# Patient Record
Sex: Female | Born: 1945
Health system: Southern US, Community
[De-identification: ages and names within clinical notes are randomized; demographics above are authoritative.]

## PROBLEM LIST (undated history)

## (undated) DIAGNOSIS — I10 Essential (primary) hypertension: Secondary | ICD-10-CM

## (undated) DIAGNOSIS — C189 Malignant neoplasm of colon, unspecified: Secondary | ICD-10-CM

## (undated) HISTORY — DX: Essential (primary) hypertension: I10

## (undated) HISTORY — PX: DILATION AND CURETTAGE, DIAGNOSTIC / THERAPEUTIC: SUR384

## (undated) HISTORY — DX: Malignant neoplasm of colon, unspecified: C18.9

---

## 2012-08-20 ENCOUNTER — Emergency Department (HOSPITAL_COMMUNITY)
Admission: EM | Admit: 2012-08-20 | Discharge: 2012-08-20 | Disposition: A | Discharge status: Home / Self Care | Payer: Medicare Other | Attending: Emergency Medicine | Admitting: Emergency Medicine

## 2012-08-20 ENCOUNTER — Emergency Department (HOSPITAL_COMMUNITY): Payer: Medicare Other

## 2012-08-20 ENCOUNTER — Encounter (HOSPITAL_COMMUNITY): Payer: Self-pay | Admitting: *Deleted

## 2012-08-20 DIAGNOSIS — S82891A Other fracture of right lower leg, initial encounter for closed fracture: Secondary | ICD-10-CM

## 2012-08-20 DIAGNOSIS — S82899A Other fracture of unspecified lower leg, initial encounter for closed fracture: Secondary | ICD-10-CM | POA: Insufficient documentation

## 2012-08-20 DIAGNOSIS — W19XXXA Unspecified fall, initial encounter: Secondary | ICD-10-CM | POA: Insufficient documentation

## 2012-08-20 MED ORDER — TRAMADOL HCL 50 MG PO TABS: 50.0000 mg | ORAL_TABLET | Freq: Four times a day (QID) | ORAL | Status: DC | PRN

## 2012-08-20 MED ORDER — OXYCODONE-ACETAMINOPHEN 5-325 MG PO TABS
1.0000 | ORAL_TABLET | Freq: Once | ORAL | Status: AC
  Administered 2012-08-20: 1 via ORAL
  Filled 2012-08-20: qty 1

## 2012-08-20 NOTE — ED Notes (Signed)
Pt. Was at home and fell when she got up.  Pt. Reports "I have a tendency to cross my feet when I get up and I fall."  Pt. Has c/o hearing a "pop and now has swelling and 2/10 pain int he right ankle."

## 2012-08-20 NOTE — Progress Notes (Signed)
Orthopedic Tech Progress Note Patient Details:  Latoya Jones 26-May-1946 [id number]  Ortho Devices Type of Ortho Device: Other (comment) Ortho Device/Splint Location: right LE Ortho Device/Splint Interventions: Application Per pt RN, applied splint to immobilize   [name] T 08/20/2012, 3:56 PM

## 2012-08-20 NOTE — ED Provider Notes (Signed)
Medical screening examination/treatment/procedure(s) were conducted as a shared visit with non-physician practitioner(s) and myself.  I personally evaluated the patient during the encounter  Note swelling and point tenderness along rt malleolus.  Discussed outpt f/u and assessted pt with obtaining a walker for support.   Tobin Chad, MD 08/20/12 [phone]

## 2012-08-20 NOTE — ED Provider Notes (Signed)
History     CSN: [id number]  Arrival date & time 08/20/12  1148   First MD Initiated Contact with Patient 08/20/12 1148      Chief Complaint  Patient presents with  . Fall  . Ankle Pain    (Consider location/radiation/quality/duration/timing/severity/associated sxs/prior treatment) HPI  66 year old female presents complaining of right ankle injury. Patient reports she was sitting on her chair with her legs crossed while drink coffee. She attempts to stand up and her leg gave out on her causing her to fall forward. She felt a pop in her right ankle with immediate sharp pain. She denies hitting her head or loss of consciousness. She did hit her right knee but denies any significant knee pain. She did not attempt to walk afterward. Her pain is controlled right now. She rated at a 2/10 without movement. She denies any other injury.  History reviewed. No pertinent past medical history.  History reviewed. No pertinent past surgical history.  History reviewed. No pertinent family history.  History  Substance Use Topics  . Smoking status: Not on file  . Smokeless tobacco: Not on file  . Alcohol Use: No    OB History    Grav Para Term Preterm Abortions TAB SAB Ect Mult Living                  Review of Systems  Constitutional: Negative for fever.  Cardiovascular: Negative for leg swelling.  Musculoskeletal: Positive for joint swelling.  Skin: Negative for wound.  Neurological: Negative for numbness.  All other systems reviewed and are negative.    Allergies  Review of patient's allergies indicates no known allergies.  Home Medications   Current Outpatient Rx  Name Route Sig Dispense Refill  . BENZTROPINE MESYLATE 1 MG PO TABS Oral Take 1 mg by mouth at bedtime.    Marland Kitchen DIVALPROEX SODIUM ER 250 MG PO TB24 Oral Take 250 mg by mouth 2 (two) times daily.    Marland Kitchen NAPROXEN SODIUM 220 MG PO TABS Oral Take 220 mg by mouth once as needed. For pain    . PERPHENAZINE 8 MG PO TABS  Oral Take 8 mg by mouth at bedtime.      BP 103/61  Pulse 73  Temp 98.4 F (36.9 C) (Oral)  Resp 18  SpO2 97%  Physical Exam  Nursing note and vitals reviewed. Constitutional: She appears well-developed and well-nourished. No distress.  HENT:  Head: Atraumatic.  Eyes: Conjunctivae normal are normal.  Neck: Neck supple.  Musculoskeletal:       Right hip: Normal.       Right knee: Normal.       Right ankle: She exhibits decreased range of motion. She exhibits no swelling, no ecchymosis, no deformity, no laceration and normal pulse. tenderness. Lateral malleolus tenderness found. No medial malleolus, no CF ligament, no head of 5th metatarsal and no proximal fibula tenderness found. Achilles tendon normal.  Neurological: She is alert.  Skin: No rash noted.  Psychiatric: She has a normal mood and affect.    ED Course  Procedures (including critical care time)  No results found for this or any previous visit. Dg Ankle Complete Right  08/20/2012  *RADIOLOGY REPORT*  Clinical Data: Right ankle pain following fall earlier today  RIGHT ANKLE - COMPLETE 3+ VIEW  Comparison: None.  Findings:  Query a subtle lucency at the distal tip of the lateral malleolus with associated overlying soft tissue swelling.  There may be a small ankle joint effusion.  Mild degenerative midfoot arthritis.  Well corticated ossicle inferior to the lateral malleolus is likely a secondary ossification center.  IMPRESSION:  Subtle lucency at the tip of the lateral malleolus with associated soft tissue swelling may represent a nondisplaced avulsion fracture.  Recommend clinical correlation for point tenderness. The remainder of the bones and joints are intact.  A small ankle joint effusion   Original Report Authenticated By: HEATH     1. Nondisplaced ankle fracture, Right  MDM  Pt loss her balance when her leg fell as sleep as she sat with her leg crossed.  She fell and injured her R ankle.  Pain most significant to  Lateral malleolar region.  No foot pain.  NVI.  Will obtain xray for further evaluation.   2:47 PM Xray show a suspected subtle lucency at the tip of the lateral malleolus with associated soft tissue swelling.  This may represents a nondisplaced avulsion fx.  The finding on xray correspond to pt's pain.  Will apply ankle splint, provide prescription for a walker and will give referral to ortho. Pain medication given.      BP 103/38  Pulse 74  Temp 98.4 F (36.9 C) (Oral)  Resp 18  SpO2 98%  Nursing notes reviewed and considered in documentation  Previous records reviewed and considered  All labs/vitals reviewed and considered  xrays reviewed and considered      Fayrene Helper, PA-C 08/20/12 1518

## 2015-03-11 ENCOUNTER — Ambulatory Visit
Admit: 2015-03-11 | Disposition: A | Discharge status: Home / Self Care | Payer: Self-pay | Attending: Family Medicine | Admitting: Family Medicine

## 2015-03-19 ENCOUNTER — Ambulatory Visit
Admit: 2015-03-19 | Disposition: A | Discharge status: Home / Self Care | Payer: Self-pay | Attending: Family Medicine | Admitting: Family Medicine

## 2016-09-14 ENCOUNTER — Ambulatory Visit
Admission: RE | Admit: 2016-09-14 | Discharge: 2016-09-14 | Disposition: A | Discharge status: Home / Self Care | Payer: Medicare Other | Source: Ambulatory Visit | Attending: Obstetrics and Gynecology | Admitting: Obstetrics and Gynecology

## 2016-09-14 ENCOUNTER — Other Ambulatory Visit: Payer: Self-pay | Admitting: Obstetrics and Gynecology

## 2016-09-14 DIAGNOSIS — L539 Erythematous condition, unspecified: Secondary | ICD-10-CM

## 2017-12-03 ENCOUNTER — Emergency Department: Payer: Medicare Other

## 2017-12-03 ENCOUNTER — Other Ambulatory Visit: Payer: Self-pay

## 2017-12-03 ENCOUNTER — Inpatient Hospital Stay: Payer: Medicare Other

## 2017-12-03 ENCOUNTER — Inpatient Hospital Stay
Admission: EM | Admit: 2017-12-03 | Discharge: 2017-12-13 | DRG: 854 | Disposition: A | Discharge status: Home / Self Care | Payer: Medicare Other | Attending: Internal Medicine | Admitting: Internal Medicine

## 2017-12-03 DIAGNOSIS — E860 Dehydration: Secondary | ICD-10-CM | POA: Diagnosis present

## 2017-12-03 DIAGNOSIS — K639 Disease of intestine, unspecified: Secondary | ICD-10-CM | POA: Diagnosis present

## 2017-12-03 DIAGNOSIS — F209 Schizophrenia, unspecified: Secondary | ICD-10-CM | POA: Diagnosis present

## 2017-12-03 DIAGNOSIS — R42 Dizziness and giddiness: Secondary | ICD-10-CM

## 2017-12-03 DIAGNOSIS — Z6837 Body mass index (BMI) 37.0-37.9, adult: Secondary | ICD-10-CM

## 2017-12-03 DIAGNOSIS — F39 Unspecified mood [affective] disorder: Secondary | ICD-10-CM | POA: Diagnosis present

## 2017-12-03 DIAGNOSIS — E872 Acidosis, unspecified: Secondary | ICD-10-CM

## 2017-12-03 DIAGNOSIS — R188 Other ascites: Secondary | ICD-10-CM | POA: Diagnosis present

## 2017-12-03 DIAGNOSIS — C180 Malignant neoplasm of cecum: Secondary | ICD-10-CM | POA: Diagnosis present

## 2017-12-03 DIAGNOSIS — Z79899 Other long term (current) drug therapy: Secondary | ICD-10-CM

## 2017-12-03 DIAGNOSIS — A419 Sepsis, unspecified organism: Principal | ICD-10-CM | POA: Diagnosis present

## 2017-12-03 DIAGNOSIS — K3580 Unspecified acute appendicitis: Secondary | ICD-10-CM | POA: Diagnosis not present

## 2017-12-03 DIAGNOSIS — I248 Other forms of acute ischemic heart disease: Secondary | ICD-10-CM | POA: Diagnosis present

## 2017-12-03 DIAGNOSIS — I2510 Atherosclerotic heart disease of native coronary artery without angina pectoris: Secondary | ICD-10-CM | POA: Diagnosis present

## 2017-12-03 DIAGNOSIS — K36 Other appendicitis: Secondary | ICD-10-CM

## 2017-12-03 DIAGNOSIS — N179 Acute kidney failure, unspecified: Secondary | ICD-10-CM | POA: Diagnosis present

## 2017-12-03 DIAGNOSIS — Z87891 Personal history of nicotine dependence: Secondary | ICD-10-CM | POA: Diagnosis not present

## 2017-12-03 DIAGNOSIS — F0390 Unspecified dementia without behavioral disturbance: Secondary | ICD-10-CM | POA: Diagnosis present

## 2017-12-03 DIAGNOSIS — K6389 Other specified diseases of intestine: Secondary | ICD-10-CM

## 2017-12-03 DIAGNOSIS — E871 Hypo-osmolality and hyponatremia: Secondary | ICD-10-CM | POA: Diagnosis present

## 2017-12-03 LAB — COMPREHENSIVE METABOLIC PANEL
ALT: 23 U/L (ref 14–54)
AST: 55 U/L — ABNORMAL HIGH (ref 15–41)
Albumin: 3 g/dL — ABNORMAL LOW (ref 3.5–5.0)
Alkaline Phosphatase: 47 U/L (ref 38–126)
Anion gap: 14 (ref 5–15)
BUN: 11 mg/dL (ref 6–20)
CHLORIDE: 98 mmol/L — AB (ref 101–111)
CO2: 15 mmol/L — ABNORMAL LOW (ref 22–32)
Calcium: 8.8 mg/dL — ABNORMAL LOW (ref 8.9–10.3)
Creatinine, Ser: 1.35 mg/dL — ABNORMAL HIGH (ref 0.44–1.00)
GFR calc Af Amer: 45 mL/min — ABNORMAL LOW (ref >60)
GFR calc non Af Amer: 39 mL/min — ABNORMAL LOW (ref >60)
Glucose, Bld: 175 mg/dL — ABNORMAL HIGH (ref 65–99)
POTASSIUM: 3.2 mmol/L — AB (ref 3.5–5.1)
Sodium: 127 mmol/L — ABNORMAL LOW (ref 135–145)
Total Bilirubin: 1.4 mg/dL — ABNORMAL HIGH (ref 0.3–1.2)
Total Protein: 6.2 g/dL — ABNORMAL LOW (ref 6.5–8.1)

## 2017-12-03 LAB — INFLUENZA PANEL BY PCR (TYPE A & B)
INFLAPCR: NEGATIVE
Influenza B By PCR: NEGATIVE

## 2017-12-03 LAB — LACTIC ACID, PLASMA
Lactic Acid, Venous: 5.7 mmol/L (ref 0.5–1.9)
Lactic Acid, Venous: 1.9 mmol/L (ref 0.5–1.9)

## 2017-12-03 LAB — CBC
HCT: 42.2 % (ref 35.0–47.0)
Hemoglobin: 14.1 g/dL (ref 12.0–16.0)
MCH: 29.6 pg (ref 26.0–34.0)
MCHC: 33.4 g/dL (ref 32.0–36.0)
MCV: 88.5 fL (ref 80.0–100.0)
PLATELETS: 217 10*3/uL (ref 150–440)
RBC: 4.77 MIL/uL (ref 3.80–5.20)
RDW: 13.4 % (ref 11.5–14.5)
WBC: 20.1 10*3/uL — AB (ref 3.6–11.0)

## 2017-12-03 LAB — URINALYSIS, COMPLETE (UACMP) WITH MICROSCOPIC
BILIRUBIN URINE: NEGATIVE
Bacteria, UA: NONE SEEN
GLUCOSE, UA: NEGATIVE mg/dL
Ketones, ur: 20 mg/dL — AB
LEUKOCYTES UA: NEGATIVE
Nitrite: NEGATIVE
PH: 6 (ref 5.0–8.0)
Protein, ur: 100 mg/dL — AB
Specific Gravity, Urine: 1.015 (ref 1.005–1.030)

## 2017-12-03 LAB — TSH: TSH: 0.913 u[IU]/mL (ref 0.350–4.500)

## 2017-12-03 LAB — TROPONIN I
Troponin I: 0.06 ng/mL (ref <0.03)
Troponin I: 0.21 ng/mL (ref <0.03)
Troponin I: 0.21 ng/mL (ref <0.03)
Troponin I: 0.16 ng/mL (ref <0.03)

## 2017-12-03 MED ORDER — PNEUMOCOCCAL VAC POLYVALENT 25 MCG/0.5ML IJ INJ
0.5000 mL | INJECTION | INTRAMUSCULAR | Status: DC
  Filled 2017-12-03: qty 0.5

## 2017-12-03 MED ORDER — ACETAMINOPHEN 325 MG PO TABS
650.0000 mg | ORAL_TABLET | Freq: Four times a day (QID) | ORAL | Status: DC | PRN
  Administered 2017-12-04 (×2): 650 mg via ORAL
  Filled 2017-12-03 (×2): qty 2

## 2017-12-03 MED ORDER — SODIUM CHLORIDE 0.9 % IV BOLUS (SEPSIS)
1000.0000 mL | Freq: Once | INTRAVENOUS | Status: AC
  Administered 2017-12-03: 1000 mL via INTRAVENOUS

## 2017-12-03 MED ORDER — PERPHENAZINE 4 MG PO TABS
4.0000 mg | ORAL_TABLET | Freq: Two times a day (BID) | ORAL | Status: DC
  Administered 2017-12-03 – 2017-12-13 (×12): 4 mg via ORAL
  Filled 2017-12-03 (×22): qty 1

## 2017-12-03 MED ORDER — CALCIUM CARBONATE-VITAMIN D 500-200 MG-UNIT PO TABS
1.0000 | ORAL_TABLET | Freq: Every day | ORAL | Status: DC
  Administered 2017-12-03 – 2017-12-13 (×6): 1 via ORAL
  Filled 2017-12-03 (×7): qty 1

## 2017-12-03 MED ORDER — ONDANSETRON HCL 4 MG/2ML IJ SOLN
4.0000 mg | Freq: Four times a day (QID) | INTRAMUSCULAR | Status: DC | PRN
  Administered 2017-12-06 (×2): 4 mg via INTRAVENOUS
  Filled 2017-12-03 (×2): qty 2

## 2017-12-03 MED ORDER — SODIUM CHLORIDE 0.9 % IV BOLUS (SEPSIS): 1000.0000 mL | Freq: Once | INTRAVENOUS | Status: DC

## 2017-12-03 MED ORDER — KETOROLAC TROMETHAMINE 30 MG/ML IJ SOLN: 15.0000 mg | Freq: Four times a day (QID) | INTRAMUSCULAR | Status: DC

## 2017-12-03 MED ORDER — METRONIDAZOLE IN NACL 5-0.79 MG/ML-% IV SOLN
500.0000 mg | Freq: Three times a day (TID) | INTRAVENOUS | Status: DC
  Administered 2017-12-03 – 2017-12-09 (×18): 500 mg via INTRAVENOUS
  Filled 2017-12-03 (×20): qty 100

## 2017-12-03 MED ORDER — KCL IN DEXTROSE-NACL 20-5-0.9 MEQ/L-%-% IV SOLN
INTRAVENOUS | Status: DC
  Administered 2017-12-03 – 2017-12-04 (×3): via INTRAVENOUS
  Filled 2017-12-03 (×6): qty 1000

## 2017-12-03 MED ORDER — ONDANSETRON HCL 4 MG PO TABS: 4.0000 mg | ORAL_TABLET | Freq: Four times a day (QID) | ORAL | Status: DC | PRN

## 2017-12-03 MED ORDER — DIVALPROEX SODIUM 500 MG PO DR TAB
500.0000 mg | DELAYED_RELEASE_TABLET | Freq: Every day | ORAL | Status: DC
  Administered 2017-12-03 – 2017-12-12 (×6): 500 mg via ORAL
  Filled 2017-12-03 (×11): qty 1

## 2017-12-03 MED ORDER — BENZTROPINE MESYLATE 0.5 MG PO TABS
0.5000 mg | ORAL_TABLET | Freq: Every day | ORAL | Status: DC
Start: 2017-12-03 — End: 2017-12-13
  Administered 2017-12-03 – 2017-12-12 (×6): 0.5 mg via ORAL
  Filled 2017-12-03 (×11): qty 1

## 2017-12-03 MED ORDER — POTASSIUM CHLORIDE 2 MEQ/ML IV SOLN: INTRAVENOUS | Status: DC

## 2017-12-03 MED ORDER — MEMANTINE HCL ER 14 MG PO CP24
14.0000 mg | ORAL_CAPSULE | Freq: Every day | ORAL | Status: DC
  Administered 2017-12-03 – 2017-12-12 (×6): 14 mg via ORAL
  Filled 2017-12-03 (×11): qty 1

## 2017-12-03 MED ORDER — ACETAMINOPHEN 650 MG RE SUPP: 650.0000 mg | Freq: Four times a day (QID) | RECTAL | Status: DC | PRN

## 2017-12-03 MED ORDER — DEXTROSE 5 % IV SOLN
500.0000 mg | Freq: Once | INTRAVENOUS | Status: AC
  Administered 2017-12-03: 500 mg via INTRAVENOUS
  Filled 2017-12-03: qty 500

## 2017-12-03 MED ORDER — DOCUSATE SODIUM 100 MG PO CAPS
100.0000 mg | ORAL_CAPSULE | Freq: Two times a day (BID) | ORAL | Status: DC
  Administered 2017-12-03 – 2017-12-05 (×6): 100 mg via ORAL
  Filled 2017-12-03 (×6): qty 1

## 2017-12-03 MED ORDER — IOPAMIDOL (ISOVUE-300) INJECTION 61%
75.0000 mL | Freq: Once | INTRAVENOUS | Status: AC | PRN
  Administered 2017-12-03: 75 mL via INTRAVENOUS

## 2017-12-03 MED ORDER — HEPARIN SODIUM (PORCINE) 5000 UNIT/ML IJ SOLN: 5000.0000 [IU] | Freq: Three times a day (TID) | INTRAMUSCULAR | Status: DC

## 2017-12-03 MED ORDER — DEXTROSE 5 % IV SOLN
2.0000 g | INTRAVENOUS | Status: DC
  Administered 2017-12-04 – 2017-12-09 (×6): 2 g via INTRAVENOUS
  Filled 2017-12-03 (×7): qty 2

## 2017-12-03 MED ORDER — DEXTROSE 5 % IV SOLN
2.0000 g | Freq: Once | INTRAVENOUS | Status: AC
  Administered 2017-12-03: 2 g via INTRAVENOUS
  Filled 2017-12-03: qty 2

## 2017-12-03 MED ORDER — SODIUM CHLORIDE 0.9 % IV SOLN
INTRAVENOUS | Status: DC
  Administered 2017-12-03: 09:00:00 via INTRAVENOUS

## 2017-12-03 MED ORDER — CALCIUM CARBONATE ANTACID 500 MG PO CHEW
1.0000 | CHEWABLE_TABLET | Freq: Two times a day (BID) | ORAL | Status: DC | PRN
  Filled 2017-12-03: qty 1

## 2017-12-03 MED ORDER — CALCIUM 600-200 MG-UNIT PO TABS: 1.0000 | ORAL_TABLET | Freq: Every day | ORAL | Status: DC

## 2017-12-03 MED ORDER — ARIPIPRAZOLE 5 MG PO TABS
5.0000 mg | ORAL_TABLET | Freq: Every day | ORAL | Status: DC
  Administered 2017-12-03 – 2017-12-12 (×6): 5 mg via ORAL
  Filled 2017-12-03 (×11): qty 1

## 2017-12-03 NOTE — H&P (Signed)
Latoya Jones is an 72 y.o. female.   Chief Complaint: Dizziness HPI: The patient with past medical history of developmental disorder along with mood disorder and dementia presents to the emergency department complaining of dizziness.  She states that she is not been steady on her feet for days.  The patient lives in an adult care home due to her behavioral problems.  She feels normal when laying completely flat but otherwise feels dizzy.  She denies chest pain or shortness of breath but admits to abdominal pain.  The patient admits that she had been constipated and so she recently received warm prune juice.  Vital signs in the emergency department were concerning for sepsis.  Patient was started on antibiotics to cover pneumonia as well as urinary tract infection prior to the emergency department staff calling the hospitalist service for admission.  History reviewed. No pertinent past medical history. Patient cannot contribute to her medical history History reviewed. No pertinent surgical history. The patient does not remember having any surgeries No family history on file. None Social History:  reports that she has quit smoking. She does not have any smokeless tobacco history on file. She reports that she does not drink alcohol or use drugs.  Allergies: No Known Allergies  Prior to Admission medications   Medication Sig Start Date End Date Taking? Authorizing Provider  acetaminophen (TYLENOL) 500 MG tablet Take 1,000 mg by mouth every 8 (eight) hours as needed for mild pain.   Yes [provider]  alendronate (FOSAMAX) 70 MG tablet Take 70 mg by mouth once a week. 11/10/17  Yes [provider]  ARIPiprazole (ABILIFY) 5 MG tablet Take 5 mg by mouth at bedtime.  11/10/17  Yes [provider]  benztropine (COGENTIN) 0.5 MG tablet Take 0.5 mg by mouth at bedtime. 11/10/17  Yes [provider]  Calcium 600-200 MG-UNIT tablet Take 1 tablet by mouth daily.   Yes  [provider]  calcium carbonate (TUMS - DOSED IN MG ELEMENTAL CALCIUM) 500 MG chewable tablet Chew 1 tablet by mouth 2 (two) times daily as needed for indigestion or heartburn.   Yes [provider]  divalproex (DEPAKOTE) 500 MG DR tablet Take 500 mg by mouth at bedtime.  11/10/17  Yes [provider]  memantine (NAMENDA XR) 14 MG CP24 24 hr capsule Take 14 mg by mouth at bedtime.  11/10/17  Yes [provider]  perphenazine (TRILAFON) 4 MG tablet Take 4 mg by mouth 2 (two) times daily. 11/10/17  Yes [provider]     Results for orders placed or performed during the hospital encounter of 12/03/17 (from the past 48 hour(s))  CBC     Status: Abnormal   Collection Time: 12/03/17  4:36 AM  Result Value Ref Range   WBC 20.1 (H) 3.6 - 11.0 K/uL   RBC 4.77 3.80 - 5.20 MIL/uL   Hemoglobin 14.1 12.0 - 16.0 g/dL   HCT 42.2 35.0 - 47.0 %   MCV 88.5 80.0 - 100.0 fL   MCH 29.6 26.0 - 34.0 pg   MCHC 33.4 32.0 - 36.0 g/dL   RDW 13.4 11.5 - 14.5 %   Platelets 217 150 - 440 K/uL    Comment: Performed at [facility], [address] [place],  [address]  Comprehensive metabolic panel     Status: Abnormal   Collection Time: 12/03/17  4:36 AM  Result Value Ref Range   Sodium 127 (L) 135 - 145 mmol/L  Potassium 3.2 (L) 3.5 - 5.1 mmol/L   Chloride 98 (L) 101 - 111 mmol/L   CO2 15 (L) 22 - 32 mmol/L   Glucose, Bld 175 (H) 65 - 99 mg/dL   BUN 11 6 - 20 mg/dL   Creatinine, Ser 1.35 (H) 0.44 - 1.00 mg/dL   Calcium 8.8 (L) 8.9 - 10.3 mg/dL   Total Protein 6.2 (L) 6.5 - 8.1 g/dL   Albumin 3.0 (L) 3.5 - 5.0 g/dL   AST 55 (H) 15 - 41 U/L   ALT 23 14 - 54 U/L   Alkaline Phosphatase 47 38 - 126 U/L   Total Bilirubin 1.4 (H) 0.3 - 1.2 mg/dL   GFR calc non Af Amer 39 (L) >60 mL/min   GFR calc Af Amer 45 (L) >60 mL/min    Comment: (NOTE) The eGFR has been calculated using the CKD EPI equation. This calculation has not been validated in all  clinical situations. eGFR's persistently <60 mL/min signify possible Chronic Kidney Disease.    Anion gap 14 5 - 15    Comment: Performed at [facility], [address] [place], [place] [address]  Troponin I     Status: Abnormal   Collection Time: 12/03/17  4:36 AM  Result Value Ref Range   Troponin I 0.06 (HH) <0.03 ng/mL    Comment: CRITICAL RESULT CALLED TO, READ BACK BY AND VERIFIED WITH [facility] AT 6010 12/03/17.PMH Performed at [facility], [address] [place], [place] [address]   Lactic acid, plasma     Status: Abnormal   Collection Time: 12/03/17  4:36 AM  Result Value Ref Range   Lactic Acid, Venous 5.7 (HH) 0.5 - 1.9 mmol/L    Comment: CRITICAL RESULT CALLED TO, READ BACK BY AND VERIFIED WITH [facility] AT 5732 12/03/17.PMH Performed at [facility], [address] [place], [place] [address]   Urinalysis, Complete w Microscopic     Status: Abnormal   Collection Time: 12/03/17  6:26 AM  Result Value Ref Range   Color, Urine YELLOW (A) YELLOW   APPearance HAZY (A) CLEAR   Specific Gravity, Urine 1.015 1.005 - 1.030   pH 6.0 5.0 - 8.0   Glucose, UA NEGATIVE NEGATIVE mg/dL   Hgb urine dipstick SMALL (A) NEGATIVE   Bilirubin Urine NEGATIVE NEGATIVE   Ketones, ur 20 (A) NEGATIVE mg/dL   Protein, ur 100 (A) NEGATIVE mg/dL   Nitrite NEGATIVE NEGATIVE   Leukocytes, UA NEGATIVE NEGATIVE   RBC / HPF 0-5 0 - 5 RBC/hpf   WBC, UA 0-5 0 - 5 WBC/hpf   Bacteria, UA NONE SEEN NONE SEEN   Squamous Epithelial / LPF 0-5 (A) NONE SEEN   Mucus PRESENT    Hyaline Casts, UA PRESENT     Comment: Performed at [facility], [address] [place],  [address]   Dg Chest 2 View  Result Date: 12/03/2017 CLINICAL DATA:  Dizzy spells and diarrhea.  Fall.  Leg weakness. EXAM: CHEST  2 VIEW COMPARISON:  None. FINDINGS: Cardiomediastinal silhouette is normal. No pleural effusions or focal consolidations. LEFT lung base strandy densities.  Trachea projects midline and there is no pneumothorax. Soft tissue planes and included osseous structures are non-suspicious. IMPRESSION: LEFT lung base atelectasis/scarring. Electronically Signed   By: Elon Alas M.D.   On: 12/03/2017 06:15    Review of Systems  Constitutional: Negative for chills and fever.  HENT: Negative for sore throat and tinnitus.   Eyes: Negative  for blurred vision and redness.  Respiratory: Negative for cough and shortness of breath.   Cardiovascular: Negative for chest pain, palpitations, orthopnea and PND.  Gastrointestinal: Positive for constipation (until received warm prune juice enema). Negative for abdominal pain, diarrhea, nausea and vomiting.  Genitourinary: Negative for dysuria, frequency and urgency.  Musculoskeletal: Negative for joint pain and myalgias.  Skin: Negative for rash.       No lesions  Neurological: Positive for dizziness. Negative for speech change, focal weakness and weakness.  Endo/Heme/Allergies: Does not bruise/bleed easily.       No temperature intolerance  Psychiatric/Behavioral: Negative for depression and suicidal ideas.    Blood pressure (!) 109/55, pulse 94, temperature 99.2 F (37.3 C), temperature source Oral, resp. rate 17, height '5\' 5"'  (1.651 m), weight 90.7 kg (200 lb), SpO2 98 %. Physical Exam  Vitals reviewed. Constitutional: She is oriented to person, place, and time. She appears well-developed and well-nourished. No distress.  HENT:  Head: Normocephalic and atraumatic.  Mouth/Throat: Oropharynx is clear and moist.  Eyes: Conjunctivae and EOM are normal. Pupils are equal, round, and reactive to light. No scleral icterus.  Neck: Normal range of motion. Neck supple. No JVD present. No tracheal deviation present. No thyromegaly present.  Cardiovascular: Normal rate, regular rhythm and normal heart sounds. Exam reveals no gallop and no friction rub.  No murmur heard. Respiratory: Effort normal and breath sounds  normal.  GI: Soft. Bowel sounds are normal. She exhibits no distension. There is no tenderness.  Genitourinary:  Genitourinary Comments: Deferred  Musculoskeletal: Normal range of motion. She exhibits no edema.  Lymphadenopathy:    She has no cervical adenopathy.  Neurological: She is alert and oriented to person, place, and time. No cranial nerve deficit. She exhibits normal muscle tone.  Skin: Skin is warm and dry. No rash noted. No erythema.  Psychiatric: She has a normal mood and affect. Her behavior is normal. Judgment and thought content normal.     Assessment/Plan This is a 72 year old female admitted for sepsis. 1.  Sepsis: The patient meets criteria via tachycardia and tachypnea.  Lactic acid also increased.  Continue ceftriaxone.  Add vancomycin.  Urine appears clean at this time.  Chest x-ray does not demonstrate pneumonia.  Concerned that process may be intra-abdominal. 2.  Abdominal pain: Reproducible with guarding over right lower quadrant.  Also firmness on physical exam of the abdomen is not yet surgical.  The patient will undergo CT scan to rule out appendicitis. 3.  Acute kidney injury: Hydrate with intravenous fluid.  Avoid nephrotoxic agents. 4. Dementia: Stable; continue Namenda 5.  Mood disorder: No behavioral disturbance at this time.  Continue Abilify, Cogentin, Depakote and perphenazine 6.  DVT prophylaxis: Lovenox 7.  GI prophylaxis: None The patient is a full code.  Time spent on admission orders and patient care approximately 45 minutes  Harrie Foreman, MD 12/03/2017, 7:23 AM

## 2017-12-03 NOTE — ED Notes (Signed)
Attempted to call report to 1C 

## 2017-12-03 NOTE — Progress Notes (Signed)
Pharmacy Antibiotic Note  Latoya Jones is a 71 y.o. female admitted on 12/03/2017 with UTI.  Pharmacy has been consulted for CEFTRIAXONE dosing.  Plan: Ceftriaxone 2 grams q 24 hours ordered.  Height: 5\' 5"  (165.1 cm) Weight: 200 lb (90.7 kg) IBW/kg (Calculated) : 57  Temp (24hrs), Avg:99.2 F (37.3 C), Min:99.2 F (37.3 C), Max:99.2 F (37.3 C)  Recent Labs  Lab 12/03/17 0436  WBC 20.1*  CREATININE 1.35*  LATICACIDVEN 5.7*    Estimated Creatinine Clearance: 43.8 mL/min (A) (by C-G formula based on SCr of 1.35 mg/dL (H)).    No Known Allergies  Antimicrobials this admission: Ceftriaxone 1/12  >>    >>   Dose adjustments this admission:   Microbiology results: No micro     1/12 UA: LE(-)  NO2(-)  WBC 0-5 Thank you for allowing pharmacy to be a part of this patient's care.  Rennie Hack S 12/03/2017 7:05 AM

## 2017-12-03 NOTE — Progress Notes (Signed)
Britton responded to order requisition for HCPOA information as patient would like her sister to be her designated voice. CH provided educational literature and educated patient on HCPOA and patient stated she will go over the paperwork with her sister and contact [facility] office when ready to complete. Nile spoke with nurse as patient stated that she was hungry and wanted to know about her procedure time.    12/03/17 1712  Clinical Encounter Type  Visited With Patient;Health care provider  Visit Type Initial;Spiritual support  Referral From Nurse;Physician

## 2017-12-03 NOTE — ED Provider Notes (Signed)
[facility] Emergency Department Provider Note   ____________________________________________   First MD Initiated Contact with Patient 12/03/17 [phone]     (approximate)  I have reviewed the triage vital signs and the nursing notes.   HISTORY  Chief Complaint Dizziness    HPI Latoya Jones is a 72 y.o. female who comes into the hospital today with dizzy spells.  The patient states that it started yesterday morning and it was worse today.  The patient states that she could not get up to go to the bathroom.  She was just dizzy.  She denies any room spinning.  She also has had no headache or runny nose.  The patient had a mild temperature when she arrived.  According to EMS the patient's O2 saturations were 88% so they placed her on some oxygen by [place] cannula.  The patient denies any history of UTI.  She states that she has had some abdominal pain but it is more consistent with her reflux.  The patient states that she was so dizzy that she sat herself down on the floor.  She has had some shortness of breath but denies any chest pain.  The patient came into the hospital today for further evaluation of her symptoms.   History reviewed. No pertinent past medical history.  Patient Active Problem List   Diagnosis Date Noted  . Sepsis ([place]) 12/03/2017    History reviewed. No pertinent surgical history.  Prior to Admission medications   Medication Sig Start Date End Date Taking? Authorizing Provider  acetaminophen (TYLENOL) 500 MG tablet Take 1,000 mg by mouth every 8 (eight) hours as needed for mild pain.   Yes [provider]  alendronate (FOSAMAX) 70 MG tablet Take 70 mg by mouth once a week. 11/10/17  Yes [provider]  ARIPiprazole (ABILIFY) 5 MG tablet Take 5 mg by mouth at bedtime.  11/10/17  Yes [provider]  benztropine (COGENTIN) 0.5 MG tablet Take 0.5 mg by mouth at bedtime. 11/10/17  Yes [provider]  Calcium  600-200 MG-UNIT tablet Take 1 tablet by mouth daily.   Yes [provider]  calcium carbonate (TUMS - DOSED IN MG ELEMENTAL CALCIUM) 500 MG chewable tablet Chew 1 tablet by mouth 2 (two) times daily as needed for indigestion or heartburn.   Yes [provider]  divalproex (DEPAKOTE) 500 MG DR tablet Take 500 mg by mouth at bedtime.  11/10/17  Yes [provider]  memantine (NAMENDA XR) 14 MG CP24 24 hr capsule Take 14 mg by mouth at bedtime.  11/10/17  Yes [provider]  perphenazine (TRILAFON) 4 MG tablet Take 4 mg by mouth 2 (two) times daily. 11/10/17  Yes [provider]    Allergies Patient has no known allergies.  No family history on file.  Social History Social History   Tobacco Use  . Smoking status: Former Smoker  Substance Use Topics  . Alcohol use: No  . Drug use: No    Review of Systems  Constitutional:  fever Eyes: No visual changes. ENT: No sore throat. Cardiovascular: Denies chest pain. Respiratory:  shortness of breath. Gastrointestinal: No abdominal pain.  No nausea, no vomiting.  No diarrhea.  No constipation. Genitourinary: Negative for dysuria. Musculoskeletal: Negative for back pain. Skin: Negative for rash. Neurological: dizziness   ____________________________________________   PHYSICAL EXAM:  VITAL SIGNS: ED Triage Vitals  Enc Vitals Group     BP 12/03/17 0430 117/79     Pulse  Rate 12/03/17 0430 (!) 114     Resp 12/03/17 0434 (!) 21     Temp 12/03/17 0434 99.2 F (37.3 C)     Temp Source 12/03/17 0434 Oral     SpO2 12/03/17 0430 94 %     Weight 12/03/17 0435 200 lb (90.7 kg)     Height 12/03/17 0435 5\' 5"  (1.651 m)     Head Circumference --      Peak Flow --      Pain Score --      Pain Loc --      Pain Edu? --      Excl. in [place]? --    Constitutional: Alert and oriented. Well appearing and in moderate distress. Eyes: Conjunctivae are normal. PERRL. EOMI. Head: Atraumatic. Nose: No  congestion/rhinnorhea. Mouth/Throat: Mucous membranes are moist.  Oropharynx non-erythematous. Cardiovascular: Normal rate, regular rhythm. Grossly normal heart sounds.  Good peripheral circulation. Respiratory: Normal respiratory effort.  No retractions. Lungs CTAB. Gastrointestinal: Soft and nontender. No distention. Positive bowel sounds Musculoskeletal: No lower extremity tenderness nor edema.   Neurologic:  Normal speech and language. Cranial nerves II through XII are grossly intact with no focal motor neuro deficit Skin:  Skin is warm, dry and intact.  Psychiatric: Mood and affect are normal.   ____________________________________________   LABS (all labs ordered are listed, but only abnormal results are displayed)  Labs Reviewed  CBC - Abnormal; Notable for the following components:      Result Value   WBC 20.1 (*)    All other components within normal limits  COMPREHENSIVE METABOLIC PANEL - Abnormal; Notable for the following components:   Sodium 127 (*)    Potassium 3.2 (*)    Chloride 98 (*)    CO2 15 (*)    Glucose, Bld 175 (*)    Creatinine, Ser 1.35 (*)    Calcium 8.8 (*)    Total Protein 6.2 (*)    Albumin 3.0 (*)    AST 55 (*)    Total Bilirubin 1.4 (*)    GFR calc non Af Amer 39 (*)    GFR calc Af Amer 45 (*)    All other components within normal limits  TROPONIN I - Abnormal; Notable for the following components:   Troponin I 0.06 (*)    All other components within normal limits  URINALYSIS, COMPLETE (UACMP) WITH MICROSCOPIC - Abnormal; Notable for the following components:   Color, Urine YELLOW (*)    APPearance HAZY (*)    Hgb urine dipstick SMALL (*)    Ketones, ur 20 (*)    Protein, ur 100 (*)    Squamous Epithelial / LPF 0-5 (*)    All other components within normal limits  LACTIC ACID, PLASMA - Abnormal; Notable for the following components:   Lactic Acid, Venous 5.7 (*)    All other components within normal limits  LACTIC ACID, PLASMA    INFLUENZA PANEL BY PCR (TYPE A & B)  TSH  TROPONIN I  TROPONIN I  TROPONIN I   ____________________________________________  EKG  ED ECG REPORT I, Loney Hering, the attending physician, personally viewed and interpreted this ECG.   Date: 12/03/2017  EKG Time: 434  Rate: 111  Rhythm: sinus tachycardia  Axis: normal  Intervals:none  ST&T Change: ST depression in leads II, III, aVF, V4, V5, V6  ____________________________________________  RADIOLOGY  Dg Chest 2 View  Result Date: 12/03/2017 CLINICAL DATA:  Dizzy spells and diarrhea.  Fall.  Leg weakness. EXAM: CHEST  2 VIEW COMPARISON:  None. FINDINGS: Cardiomediastinal silhouette is normal. No pleural effusions or focal consolidations. LEFT lung base strandy densities. Trachea projects midline and there is no pneumothorax. Soft tissue planes and included osseous structures are non-suspicious. IMPRESSION: LEFT lung base atelectasis/scarring. Electronically Signed   By: Elon Alas M.D.   On: 12/03/2017 06:15   Ct Abdomen Pelvis W Contrast  Result Date: 12/03/2017 CLINICAL DATA:  Generalized abdominal pain. EXAM: CT ABDOMEN AND PELVIS WITH CONTRAST TECHNIQUE: Multidetector CT imaging of the abdomen and pelvis was performed using the standard protocol following bolus administration of intravenous contrast. CONTRAST:  31mL ISOVUE-300 IOPAMIDOL (ISOVUE-300) INJECTION 61% COMPARISON:  None. FINDINGS: Lower chest: Dependent subsegmental atelectasis identified. Lung bases are otherwise normal. Hepatobiliary: No focal liver abnormality is seen. No gallstones, gallbladder wall thickening, or biliary dilatation. Pancreas: Unremarkable. No pancreatic ductal dilatation or surrounding inflammatory changes. Spleen: Normal in size without focal abnormality. Adrenals/Urinary Tract: A tiny nodule seen in the right adrenal gland measuring 9 mm. There is nodularity in the left adrenal gland as well with a nodule on coronal image 56 measuring  up to 12 mm. Bilateral renal cysts are identified. A nonobstructive stone is seen in the lower pole left kidney measuring 2 or 3 mm. Mild caliectasis bilaterally with no ureteral stones. The bladder is mildly distended. Stomach/Bowel: The stomach is normal. There is bulky masslike thickening in the cecum and ascending colon. The appendix is dilated and thickened with adjacent stranding consistent with appendicitis secondary to the cecal mass. No evidence of perforation. No appendicoliths. The appendix measures up to 2.1 cm distally. A loop of small bowel running inferior to the appendix is inflamed and thick walled. No small bowel obstruction or other small bowel abnormality. Vascular/Lymphatic: Atherosclerotic changes are seen in the nonaneurysmal aorta. A few mildly prominent pericecal nodes are identified such as on coronal image 51 and coronal image 30. No other adenopathy. Reproductive: Uterus and bilateral adnexa are unremarkable. Other: No free air. No omental or peritoneal abnormalities identified. Musculoskeletal: No acute or significant osseous findings. IMPRESSION: 1. 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 An adjacent loop of small bowel is inflamed as well, likely secondary to the cecal and appendiceal processes. Other causes such as inflammatory bowel disease are considered much less likely. The prominent peri cecal nodes could be reactive or metastatic. 2. Nodularity in the adrenal glands could represent adenomas. However, given the suspicion for malignancy, the patient may benefit from a PET-CT or MRI for further evaluation of these nodules. 3. Bilateral caliectasis, likely due to the distended bladder. No underlying cause otherwise seen. Findings being called to referring physician Electronically Signed   By: Dorise Bullion III M.D   On: 12/03/2017 08:15     ____________________________________________   PROCEDURES  Procedure(s) performed: please, see procedure note(s).  .Critical Care Performed by: Loney Hering, MD Authorized by: Loney Hering, MD   Critical care provider statement:    Critical care time (minutes):  30   Critical care start time:  12/03/2017 5:15 AM   Critical care end time:  12/03/2017 5:45 AM   Critical care was necessary to treat or prevent imminent or life-threatening deterioration of the following conditions:  Dehydration and sepsis   Critical care was time spent personally by me on the following activities:  Blood draw for specimens, development of treatment plan with patient or surrogate,  discussions with consultants, evaluation of patient's response to treatment, examination of patient, interpretation of cardiac output measurements, ordering and performing treatments and interventions, obtaining history from patient or surrogate, ordering and review of laboratory studies, ordering and review of radiographic studies, pulse oximetry, re-evaluation of patient's condition and review of old charts   I assumed direction of critical care for this patient from another provider in my specialty: no      Critical Care performed: Yes, see critical care note(s)  ____________________________________________   INITIAL IMPRESSION / ASSESSMENT AND PLAN / ED COURSE  As part of my medical decision making, I reviewed the following data within the electronic MEDICAL RECORD NUMBER Notes from prior ED visits and  Controlled Substance Database  This is a 72 year old female who comes into the hospital today with some dizziness.  The patient did have some tachycardia on arrival.  My differential diagnosis includes pneumonia, sepsis, dehydration, vertigo  When I did evaluate the patient her initial temperature was 100.3 which made me concerned for possible infectious cause of her dizziness.  Patient has had some shortness of  breath my concern was for pneumonia.  The patient had a chest x-ray which showed some left lung base atelectasis. The patient was admitted for sepsis.  While the admitting physician was examining the patient he discovered that she had some right lower quadrant abdominal pain.  She did not report this previously and it was not discovered on my initial exam.  We sent the patient for a CT scan looking for possible appendicitis given the negative urine and chest x-ray.  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  The patient will be admitted to the hospitalist service.  She was given some ceftriaxone and azithromycin.  She also was given 3 L of normal saline as her lactic acid was elevated.        ____________________________________________   FINAL CLINICAL IMPRESSION(S) / ED DIAGNOSES  Final diagnoses:  Dizziness  Hyponatremia  Lactic acidosis     ED Discharge Orders    None       Note:  This document was prepared using Dragon voice recognition software and may include unintentional dictation errors.    Loney Hering, MD 12/03/17 [phone]

## 2017-12-03 NOTE — ED Notes (Signed)
Pt transported by RN and EDT to room 102. Bedside report given at 0812 to Jackqulyn Livings., RN

## 2017-12-03 NOTE — Consult Note (Signed)
Jonathon Bellows , MD [address] [place], [place], [place], [address] [id number] [place], [place], [place], [address] Phone: [phone]  Fax: [phone]  Consultation  Referring Provider:  Dr Jerelyn Charles  Primary Care Physician:  Randel Pigg, MD Primary Gastroenterologist: None          Reason for Consultation:     Cecal mass   Date of Admission:  12/03/2017 Date of Consultation:  12/03/2017         HPI:   Latoya Jones is a 72 y.o. female . She has a history of a development disorder , admitted with sepsis, abdominal pain and underwent a CT scan which revealed acute appendicitis along with a cecal mass which is likely the cause of her appendicitis.  She has a history of dementia. [place] 20 K on admission .    She says she has had lower abdominal pain for a few days, no nausea or vomiting . She says she had some fever but cant recall when. Poor historian. Appears comfortable presently.   History reviewed. No pertinent past medical history.  History reviewed. No pertinent surgical history.  Prior to Admission medications   Medication Sig Start Date End Date Taking? Authorizing Provider  acetaminophen (TYLENOL) 500 MG tablet Take 1,000 mg by mouth every 8 (eight) hours as needed for mild pain.   Yes [provider]  alendronate (FOSAMAX) 70 MG tablet Take 70 mg by mouth once a week. 11/10/17  Yes [provider]  ARIPiprazole (ABILIFY) 5 MG tablet Take 5 mg by mouth at bedtime.  11/10/17  Yes [provider]  benztropine (COGENTIN) 0.5 MG tablet Take 0.5 mg by mouth at bedtime. 11/10/17  Yes [provider]  Calcium 600-200 MG-UNIT tablet Take 1 tablet by mouth daily.   Yes [provider]  calcium carbonate (TUMS - DOSED IN MG ELEMENTAL CALCIUM) 500 MG chewable tablet Chew 1 tablet by mouth 2 (two) times daily as needed for indigestion or heartburn.   Yes [provider]  divalproex (DEPAKOTE) 500 MG DR tablet Take 500  mg by mouth at bedtime.  11/10/17  Yes [provider]  memantine (NAMENDA XR) 14 MG CP24 24 hr capsule Take 14 mg by mouth at bedtime.  11/10/17  Yes [provider]  perphenazine (TRILAFON) 4 MG tablet Take 4 mg by mouth 2 (two) times daily. 11/10/17  Yes [provider]    No family history on file.   Social History   Tobacco Use  . Smoking status: Former Smoker  Substance Use Topics  . Alcohol use: No  . Drug use: No    Allergies as of 12/03/2017  . (No Known Allergies)    Review of Systems:    All systems reviewed and negative except where noted in HPI.   Physical Exam:  Vital signs in last 24 hours: Temp:  [99.2 F (37.3 C)-99.4 F (37.4 C)] 99.4 F (37.4 C) (01/12 0824) Pulse Rate:  [91-114] 91 (01/12 0824) Resp:  [17-27] 18 (01/12 0824) BP: (96-137)/(55-79) 137/58 (01/12 0824) SpO2:  [94 %-100 %] 100 % (01/12 0824) FiO2 (%):  [96 %] 96 % (01/12 0444) Weight:  [200 lb (90.7 kg)-206 lb 11.2 oz (93.8 kg)] 206 lb 11.2 oz (93.8 kg) (01/12 0824)   General:   Pleasant, cooperative in NAD Head:  Normocephalic and atraumatic. Eyes:   No icterus.   Conjunctiva pink. PERRLA. Ears:  Normal auditory acuity. Neck:  Supple; no masses or thyroidomegaly  Lungs: Respirations even and unlabored. Lungs clear to auscultation bilaterally.   No wheezes, crackles, or rhonchi.  Heart:  Regular rate and rhythm;  Without murmur, clicks, rubs or gallops Abdomen:  Soft, nondistended,mild to moderate RLQ tenderness . Normal bowel sounds. No appreciable masses or hepatomegaly.  No rebound or guarding.  Neurologic:  Alert and oriented x2(president and place);  grossly normal neurologically. Skin:  Intact without significant lesions or rashes. Cervical Nodes:  No significant cervical adenopathy. Psych:  Alert and cooperative. Normal affect.  LAB RESULTS: Recent Labs    12/03/17 0436  WBC 20.1*  HGB 14.1  HCT 42.2  PLT 217   BMET Recent Labs     12/03/17 0436  NA 127*  K 3.2*  CL 98*  CO2 15*  GLUCOSE 175*  BUN 11  CREATININE 1.35*  CALCIUM 8.8*   LFT Recent Labs    12/03/17 0436  PROT 6.2*  ALBUMIN 3.0*  AST 55*  ALT 23  ALKPHOS 47  BILITOT 1.4*   PT/INR No results for input(s): LABPROT, INR in the last 72 hours.  STUDIES: Dg Chest 2 View  Result Date: 12/03/2017 CLINICAL DATA:  Dizzy spells and diarrhea.  Fall.  Leg weakness. EXAM: CHEST  2 VIEW COMPARISON:  None. FINDINGS: Cardiomediastinal silhouette is normal. No pleural effusions or focal consolidations. LEFT lung base strandy densities. Trachea projects midline and there is no pneumothorax. Soft tissue planes and included osseous structures are non-suspicious. IMPRESSION: LEFT lung base atelectasis/scarring. Electronically Signed   By: Elon Alas M.D.   On: 12/03/2017 06:15   Ct Abdomen Pelvis W Contrast  Result Date: 12/03/2017 CLINICAL DATA:  Generalized abdominal pain. EXAM: CT ABDOMEN AND PELVIS WITH CONTRAST TECHNIQUE: Multidetector CT imaging of the abdomen and pelvis was performed using the standard protocol following bolus administration of intravenous contrast. CONTRAST:  67mL ISOVUE-300 IOPAMIDOL (ISOVUE-300) INJECTION 61% COMPARISON:  None. FINDINGS: Lower chest: Dependent subsegmental atelectasis identified. Lung bases are otherwise normal. Hepatobiliary: No focal liver abnormality is seen. No gallstones, gallbladder wall thickening, or biliary dilatation. Pancreas: Unremarkable. No pancreatic ductal dilatation or surrounding inflammatory changes. Spleen: Normal in size without focal abnormality. Adrenals/Urinary Tract: A tiny nodule seen in the right adrenal gland measuring 9 mm. There is nodularity in the left adrenal gland as well with a nodule on coronal image 56 measuring up to 12 mm. Bilateral renal cysts are identified. A nonobstructive stone is seen in the lower pole left kidney measuring 2 or 3 mm. Mild caliectasis bilaterally with no  ureteral stones. The bladder is mildly distended. Stomach/Bowel: The stomach is normal. There is bulky masslike thickening in the cecum and ascending colon. The appendix is dilated and thickened with adjacent stranding consistent with appendicitis secondary to the cecal mass. No evidence of perforation. No appendicoliths. The appendix measures up to 2.1 cm distally. A loop of small bowel running inferior to the appendix is inflamed and thick walled. No small bowel obstruction or other small bowel abnormality. Vascular/Lymphatic: Atherosclerotic changes are seen in the nonaneurysmal aorta. A few mildly prominent pericecal nodes are identified such as on coronal image 51 and coronal image 30. No other adenopathy. Reproductive: Uterus and bilateral adnexa are unremarkable. Other: No free air. No omental or peritoneal abnormalities identified. Musculoskeletal: No acute or significant osseous findings. IMPRESSION: 1. 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 An adjacent loop of small bowel is inflamed as well, likely secondary  to the cecal and appendiceal processes. Other causes such as inflammatory bowel disease are considered much less likely. The prominent peri cecal nodes could be reactive or metastatic. 2. Nodularity in the adrenal glands could represent adenomas. However, given the suspicion for malignancy, the patient may benefit from a PET-CT or MRI for further evaluation of these nodules. 3. Bilateral caliectasis, likely due to the distended bladder. No underlying cause otherwise seen. Findings being called to referring physician Electronically Signed   By: Dorise Bullion III M.D   On: 12/03/2017 08:15      Impression / Plan:   Brie Eppard is a 72 y.o. y/o female with dementia , admitted with sepsis - abdominal pain , leucocytosis . Labs suggest metabolic acidosis , AKI, elevated troponin. CT  scan revealed large cecal mass obstructing the origin of the appendix resulting in a secondary appendicitis. If the appendicitis is from an obstructing lesion probably less likely that the infection would resolve completely  due to inadequate drainage. With ongoing acute appendicitis- would be a contraindication for a colonoscopy due to risk of perforation.    IF no plans for surgery at this time, can revaluate option of colonoscopy once appendicitis has resolved as evidenced by no evidence of sepsis, resolved leucocytosis and no abdominal pain. At that time she may need NG tube for bowel prep if cannot drink/tolerate  the prep orally   Thank you for involving me in the care of this patient.      LOS: 0 days   Jonathon Bellows, MD  12/03/2017, 10:11 AM

## 2017-12-03 NOTE — ED Notes (Signed)
Pt back from CT

## 2017-12-03 NOTE — Consult Note (Signed)
Hematology/Oncology Consult note [facility] Telephone:([phone] [phone]  Patient Care Team: Randel Pigg, MD as PCP - General (Psychiatry)   Name of the patient: Latoya Jones  [id number]  07-19-1946    Reason for referral- cecal mass   Requesting physician: Dr. Jerelyn Charles  Date of visit: 12/03/2017   History of presenting illness- Patient is a 72 year old female with a h/o developmental and mood disoredr who resides at a group home. She presented to the ER with symptoms of dizziness mostly when she stands up. She has had problems with constipation in the past and takes prune juice to relieve it. Patient is a poor historian at baseline and complained of abdominal pain in ER which led to CT abdomen. 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 Wbc elevated to 20. Mild troponin . Labs also showed hyponatremia, hypochloremia, elevated creatinine and metabolic acidosis concerning for sepsis. She is currently on IV antibiotics. Dr. Adonis Huguenin has seen the patient and does not recommend emergent surgery. GI has been consulted as well.   Currently patient reports that her abdominal pain is well controlled. She has been at group home for 3 years and reports she is independent of her ADL's. She has been taking health care decisions herself.    Pain scale- 0   Review of systems- Review of Systems  Constitutional: Negative for chills, fever, malaise/fatigue and weight loss.  HENT: Negative for congestion, ear discharge and nosebleeds.   Eyes: Negative for blurred vision.  Respiratory: Negative for cough, hemoptysis, sputum production, shortness of breath and wheezing.   Cardiovascular: Negative for chest pain, palpitations, orthopnea and claudication.  Gastrointestinal: Positive for abdominal pain. Negative for blood in stool, constipation, diarrhea, heartburn, melena, nausea and vomiting.  Genitourinary: Negative for dysuria, flank pain,  frequency, hematuria and urgency.  Musculoskeletal: Negative for back pain, joint pain and myalgias.  Skin: Negative for rash.  Neurological: Positive for dizziness and weakness. Negative for tingling, focal weakness, seizures and headaches.  Endo/Heme/Allergies: Does not bruise/bleed easily.  Psychiatric/Behavioral: Negative for depression and suicidal ideas. The patient does not have insomnia.     No Known Allergies  Patient Active Problem List   Diagnosis Date Noted  . Sepsis ([place]) 12/03/2017  . Colonic mass      History reviewed. No pertinent past medical history.   History reviewed. No pertinent surgical history.  Social History   Socioeconomic History  . Marital status: Widowed    Spouse name: Not on file  . Number of children: Not on file  . Years of education: Not on file  . Highest education level: Not on file  Social Needs  . Financial resource strain: Not on file  . Food insecurity - worry: Not on file  . Food insecurity - inability: Not on file  . Transportation needs - medical: Not on file  . Transportation needs - non-medical: Not on file  Occupational History  . Not on file  Tobacco Use  . Smoking status: Former Smoker  Substance and Sexual Activity  . Alcohol use: No  . Drug use: No  . Sexual activity: No  Other Topics Concern  . Not on file  Social History Narrative  . Not on file     No family history on file.   Current Facility-Administered Medications:  .  0.9 %  sodium chloride infusion, , Intravenous, Continuous, Harrie Foreman, MD, Last Rate: 125 mL/hr at 12/03/17 0834 .  acetaminophen (TYLENOL) tablet 650 mg,  650 mg, Oral, Q6H PRN **OR** acetaminophen (TYLENOL) suppository 650 mg, 650 mg, Rectal, Q6H PRN, Harrie Foreman, MD .  ARIPiprazole (ABILIFY) tablet 5 mg, 5 mg, Oral, QHS, Harrie Foreman, MD .  benztropine (COGENTIN) tablet 0.5 mg, 0.5 mg, Oral, QHS, Harrie Foreman, MD .  calcium carbonate (TUMS - dosed in mg  elemental calcium) chewable tablet 200 mg of elemental calcium, 1 tablet, Oral, BID PRN, Harrie Foreman, MD .  calcium-vitamin D (OSCAL WITH D) 500-200 MG-UNIT per tablet 1 tablet, 1 tablet, Oral, Daily, Harrie Foreman, MD .  Derrill Memo ON 12/04/2017] cefTRIAXone (ROCEPHIN) 2 g in dextrose 5 % 50 mL IVPB, 2 g, Intravenous, Q24H, [place], Eduard Roux, MD .  dextrose 5 % and 0.9 % NaCl with KCl 20 mEq/L infusion, , Intravenous, Continuous, Salary, Montell D, MD .  divalproex (DEPAKOTE) DR tablet 500 mg, 500 mg, Oral, QHS, Harrie Foreman, MD .  docusate sodium (COLACE) capsule 100 mg, 100 mg, Oral, BID, Harrie Foreman, MD .  memantine (NAMENDA XR) 24 hr capsule 14 mg, 14 mg, Oral, QHS, Harrie Foreman, MD .  metroNIDAZOLE (FLAGYL) IVPB 500 mg, 500 mg, Intravenous, Q8H, Salary, Montell D, MD .  ondansetron (ZOFRAN) tablet 4 mg, 4 mg, Oral, Q6H PRN **OR** ondansetron (ZOFRAN) injection 4 mg, 4 mg, Intravenous, Q6H PRN, Harrie Foreman, MD .  perphenazine (TRILAFON) tablet 4 mg, 4 mg, Oral, BID, Harrie Foreman, MD .  [COMPLETED] sodium chloride 0.9 % bolus 1,000 mL, 1,000 mL, Intravenous, Once, Last Rate: 2,000 mL/hr at 12/03/17 0631, 1,000 mL at 12/03/17 0631 **AND** [COMPLETED] sodium chloride 0.9 % bolus 1,000 mL, 1,000 mL, Intravenous, Once, Last Rate: 2,000 mL/hr at 12/03/17 0736, 1,000 mL at 12/03/17 0736 **AND** sodium chloride 0.9 % bolus 1,000 mL, 1,000 mL, Intravenous, Once, Loney Hering, MD   Physical exam:  Vitals:   12/03/17 0500 12/03/17 0530 12/03/17 0630 12/03/17 0824  BP: 96/62 (!) 97/58 (!) 109/55 (!) 137/58  Pulse: (!) 103 98 94 91  Resp: (!) 27 (!) 21 17 18   Temp:    99.4 F (37.4 C)  TempSrc:    Oral  SpO2: 95% 97% 98% 100%  Weight:    206 lb 11.2 oz (93.8 kg)  Height:    5\' 5"  (1.651 m)   Physical Exam  Constitutional: She is oriented to person, place, and time and well-developed, well-nourished, and in no distress.  Feels warm to touch  HENT:    Head: Normocephalic and atraumatic.  Eyes: EOM are normal. Pupils are equal, round, and reactive to light.  Neck: Normal range of motion.  Cardiovascular: Regular rhythm and normal heart sounds.  tachycardic  Pulmonary/Chest: Effort normal and breath sounds normal.  Abdominal: Soft. Bowel sounds are normal.  Mild TTP in RLQ  Neurological: She is alert and oriented to person, place, and time.  Skin: Skin is warm and dry.       CMP Latest Ref Rng & Units 12/03/2017  Glucose 65 - 99 mg/dL 175(H)  BUN 6 - 20 mg/dL 11  Creatinine 0.44 - 1.00 mg/dL 1.35(H)  Sodium 135 - 145 mmol/L 127(L)  Potassium 3.5 - 5.1 mmol/L 3.2(L)  Chloride 101 - 111 mmol/L 98(L)  CO2 22 - 32 mmol/L 15(L)  Calcium 8.9 - 10.3 mg/dL 8.8(L)  Total Protein 6.5 - 8.1 g/dL 6.2(L)  Total Bilirubin 0.3 - 1.2 mg/dL 1.4(H)  Alkaline Phos 38 - 126 U/L 47  AST 15 - 41  U/L 55(H)  ALT 14 - 54 U/L 23   CBC Latest Ref Rng & Units 12/03/2017  WBC 3.6 - 11.0 K/uL 20.1(H)  Hemoglobin 12.0 - 16.0 g/dL 14.1  Hematocrit 35.0 - 47.0 % 42.2  Platelets 150 - 440 K/uL 217    @IMAGES @  Dg Chest 2 View  Result Date: 12/03/2017 CLINICAL DATA:  Dizzy spells and diarrhea.  Fall.  Leg weakness. EXAM: CHEST  2 VIEW COMPARISON:  None. FINDINGS: Cardiomediastinal silhouette is normal. No pleural effusions or focal consolidations. LEFT lung base strandy densities. Trachea projects midline and there is no pneumothorax. Soft tissue planes and included osseous structures are non-suspicious. IMPRESSION: LEFT lung base atelectasis/scarring. Electronically Signed   By: Elon Alas M.D.   On: 12/03/2017 06:15   Ct Abdomen Pelvis W Contrast  Result Date: 12/03/2017 CLINICAL DATA:  Generalized abdominal pain. EXAM: CT ABDOMEN AND PELVIS WITH CONTRAST TECHNIQUE: Multidetector CT imaging of the abdomen and pelvis was performed using the standard protocol following bolus administration of intravenous contrast. CONTRAST:  59mL ISOVUE-300  IOPAMIDOL (ISOVUE-300) INJECTION 61% COMPARISON:  None. FINDINGS: Lower chest: Dependent subsegmental atelectasis identified. Lung bases are otherwise normal. Hepatobiliary: No focal liver abnormality is seen. No gallstones, gallbladder wall thickening, or biliary dilatation. Pancreas: Unremarkable. No pancreatic ductal dilatation or surrounding inflammatory changes. Spleen: Normal in size without focal abnormality. Adrenals/Urinary Tract: A tiny nodule seen in the right adrenal gland measuring 9 mm. There is nodularity in the left adrenal gland as well with a nodule on coronal image 56 measuring up to 12 mm. Bilateral renal cysts are identified. A nonobstructive stone is seen in the lower pole left kidney measuring 2 or 3 mm. Mild caliectasis bilaterally with no ureteral stones. The bladder is mildly distended. Stomach/Bowel: The stomach is normal. There is bulky masslike thickening in the cecum and ascending colon. The appendix is dilated and thickened with adjacent stranding consistent with appendicitis secondary to the cecal mass. No evidence of perforation. No appendicoliths. The appendix measures up to 2.1 cm distally. A loop of small bowel running inferior to the appendix is inflamed and thick walled. No small bowel obstruction or other small bowel abnormality. Vascular/Lymphatic: Atherosclerotic changes are seen in the nonaneurysmal aorta. A few mildly prominent pericecal nodes are identified such as on coronal image 51 and coronal image 30. No other adenopathy. Reproductive: Uterus and bilateral adnexa are unremarkable. Other: No free air. No omental or peritoneal abnormalities identified. Musculoskeletal: No acute or significant osseous findings. IMPRESSION: 1. 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 An adjacent loop of small bowel is inflamed as well, likely secondary to the cecal and  appendiceal processes. Other causes such as inflammatory bowel disease are considered much less likely. The prominent peri cecal nodes could be reactive or metastatic. 2. Nodularity in the adrenal glands could represent adenomas. However, given the suspicion for malignancy, the patient may benefit from a PET-CT or MRI for further evaluation of these nodules. 3. Bilateral caliectasis, likely due to the distended bladder. No underlying cause otherwise seen. Findings being called to referring physician Electronically Signed   By: Dorise Bullion III M.D   On: 12/03/2017 08:15    Assessment and plan- Patient is a 72 y.o. female with dementia and developmental disorder admitted for abdominal pain and dizziness found to have large cecal mass causing secondary appendicitis and severe sepsis  From an oncological standpoint- please obtain CT chest (  likely without contrast given AKI which can further worsen due to IV contrast) to complete staging work up. Please obtain baseline CEA (ordered). Cbc done yesterday did not reveal any anemia although she may be hemoconcentrated in the setting of sepsis. No microcytosis to suggest chronic iron deficiency.   Patient was able to verbalize in her own words ct findings of cecal mass and appendicitis. She does seem to have decision making capacity. Social work input would be helpful to obtain more information regarding this from the group home  Ultimately patient will need to undergo surgery at some point given that she has secondary appendicitis due to cecal mass. There were some peri- cecal nodes noted. No other adenopathy or liver mets. CXR did not reveal any lung mets/ masses but she needs CT thorax to evaluate further  Decisions regarding chemotherapy after surgery to be made as an outpatient after final path is available post surgery.    Thank you for this kind referral and the opportunity to participate in the care of this patient   Visit Diagnosis 1. Dizziness    2. Hyponatremia   3. Lactic acidosis     Dr. Randa Evens, MD, MPH [facility] Pager- [phone] 12/03/2017  3:14 PM

## 2017-12-03 NOTE — ED Triage Notes (Signed)
Pt to the ER for dizzy spells and diarhhea, and fell into the floor. Legs have been weak. Hx of UTI. Temp 100.3. 94% on 2L. 88% on room aair.

## 2017-12-03 NOTE — Consult Note (Signed)
Patient ID: Latoya Jones, female   DOB: January 20, 1946, 72 y.o.   MRN: [id number]  CC: Abdominal pain  HPI Latoya Jones is a 72 y.o. female who was admitted by the internal medicine service earlier today.  General surgery consult was requested by Dr. Jerelyn  for CT findings of a large cecal mass with secondary appendicitis.  Patient is a somewhat poor historian due to her history of developmental disorder and dementia.  Her primary complaint that brought her to the hospital was of dizziness.  Patient lives in adult care home due to behavioral problems.  Patient reports anytime she sits up she gets dizzy.  She reports she is very hungry.  She does state that she has had some abdominal pain but cannot characterize it.  She thinks her abdomen has been a little bit out of sorts for the last week.  She has been having loose bowel movements since taking prune juice for constipation earlier this week.  She denies any fevers, chills, nausea, vomiting, chest pain, shortness of breath.  HPI  Past medical history: Dementia and patient is unable to contribute to her medical history.  Per the chart she has history of heart disease but it is not otherwise clarified.  History reviewed. No pertinent surgical history.  Patient denies ever having had surgery.  Family history: Patient is unsure but denies any knowledge of cancer, diabetes, heart disease.  Social History Social History   Tobacco Use  . Smoking status: Former Smoker  Substance Use Topics  . Alcohol use: No  . Drug use: No    No Known Allergies  Current Facility-Administered Medications  Medication Dose Route Frequency Provider Last Rate Last Dose  . 0.9 %  sodium chloride infusion   Intravenous Continuous Harrie Foreman, MD 125 mL/hr at 12/03/17 [phone]    . acetaminophen (TYLENOL) tablet 650 mg  650 mg Oral Q6H PRN Harrie Foreman, MD       Or  . acetaminophen (TYLENOL) suppository 650 mg  650 mg Rectal Q6H PRN Harrie Foreman, MD       . ARIPiprazole (ABILIFY) tablet 5 mg  5 mg Oral QHS Harrie Foreman, MD      . benztropine (COGENTIN) tablet 0.5 mg  0.5 mg Oral QHS Harrie Foreman, MD      . calcium carbonate (TUMS - dosed in mg elemental calcium) chewable tablet 200 mg of elemental calcium  1 tablet Oral BID PRN Harrie Foreman, MD      . calcium-vitamin D (OSCAL WITH D) 500-200 MG-UNIT per tablet 1 tablet  1 tablet Oral Daily Harrie Foreman, MD      . Derrill Memo ON 12/04/2017] cefTRIAXone (ROCEPHIN) 2 g in dextrose 5 % 50 mL IVPB  2 g Intravenous Q24H Loney Hering, MD      . dextrose 5 % and 0.9 % NaCl with KCl 20 mEq/L infusion   Intravenous Continuous Salary, Montell D, MD      . divalproex (DEPAKOTE) DR tablet 500 mg  500 mg Oral QHS Harrie Foreman, MD      . docusate sodium (COLACE) capsule 100 mg  100 mg Oral BID Harrie Foreman, MD      . memantine (NAMENDA XR) 24 hr capsule 14 mg  14 mg Oral QHS Harrie Foreman, MD      . metroNIDAZOLE (FLAGYL) IVPB 500 mg  500 mg Intravenous Q8H Salary, Montell D, MD      . ondansetron (ZOFRAN) tablet  4 mg  4 mg Oral Q6H PRN Harrie Foreman, MD       Or  . ondansetron [facility]) injection 4 mg  4 mg Intravenous Q6H PRN Harrie Foreman, MD      . perphenazine (TRILAFON) tablet 4 mg  4 mg Oral BID Harrie Foreman, MD      . sodium chloride 0.9 % bolus 1,000 mL  1,000 mL Intravenous Once Loney Hering, MD         Review of Systems A multi-point review of systems was asked and was negative except for the findings documented in the HPI  Physical Exam Blood pressure (!) 137/58, pulse 91, temperature 99.4 F (37.4 C), temperature source Oral, resp. rate 18, height 5\' 5"  (1.651 m), weight 93.8 kg (206 lb 11.2 oz), SpO2 100 %. CONSTITUTIONAL: Resting in bed no acute distress. EYES: Pupils are equal, round, and reactive to light, Sclera are non-icteric. EARS, NOSE, MOUTH AND THROAT: The oropharynx is clear. The oral mucosa is pink and moist.  Hearing is intact to voice. LYMPH NODES:  Lymph nodes in the neck are normal. RESPIRATORY:  Lungs are clear. There is normal respiratory effort, with equal breath sounds bilaterally, and without pathologic use of accessory muscles. CARDIOVASCULAR: Heart is regular without murmurs, gallops, or rubs. GI: The abdomen is large, soft, mildly tender to deep palpation in the right lower quadrant, and nondistended. There are no palpable masses. There is no hepatosplenomegaly. There are normal bowel sounds in all quadrants. GU: Rectal deferred.   MUSCULOSKELETAL: Normal muscle strength and tone. No cyanosis or edema.   SKIN: Turgor is good and there are no pathologic skin lesions or ulcers. NEUROLOGIC: Motor and sensation is grossly normal. Cranial nerves are grossly intact. PSYCH:  Oriented to person, place and time. Affect is normal.  Data Reviewed Images and labs reviewed.  Labs are concerning for a leukocytosis of 20.1, lactic acid on admission was 5.7 but is currently down to 1.9, numerous electrolyte abnormalities including hyponatremia of 127, hypochloremia of 98, hypokalemia of 32.  Elevated creatinine 1.35 as well as a total bilirubin of 1.4.  Urinalysis was contaminated but appeared to show evidence of infection.  CT scan of the abdomen shows a large cecal mass with inflammatory versus desmoplastic reaction around it.  There also appears to be numerous lymph nodes of either infectious or metastatic nature.  The appendix, small bowel, surrounding intestines are all inflamed likely representing a desmoplastic reaction.  No evidence of free air or abscess I have personally reviewed the patient's imaging, laboratory findings and medical records.    Assessment    Cecal mass    Plan    72 year old female with cognitive disorder and what appears to be a large cecal mass.  The radiologist interpreted the CT scan is having secondary appendicitis however the inflammation also involves the small bowel  and the colon.  This likely represents a desmoplastic reaction to a colon cancer.  Discussed with the patient that she has a mass in her colon that needs to be further evaluated.  Given the lack of peritonitis or image findings of perforation or abscess no indications for emergent operation.  Would recommend consultations with GI and oncology to better stage this patient.  Will need discussed with the patient and her caregivers prior to proceeding with a hemicolectomy which is what operation would be offered for right-sided colon cancer.  Would continue to treat to the inflammatory process with antibiotics and to fluid resuscitate  the patient.  Would aggressively correct electrolytes.  No plans for urgent operation at this time.  Could potentially start a diet pending when GI would want to perform an endoscopy for tissue diagnosis of this large colon mass.  General surgery will follow along with you.     Time spent with the patient was 80 minutes, with more than 50% of the time spent in face-to-face education, counseling and care coordination.     Clayburn Pert, MD FACS General Surgeon 12/03/2017, 9:42 AM

## 2017-12-03 NOTE — Progress Notes (Signed)
[place] [facility] [place] NAME: Latoya Jones    MR#:  [id number]  DATE OF BIRTH:  1946-07-14  SUBJECTIVE:  CHIEF COMPLAINT:   Chief Complaint  Patient presents with  . Dizziness   Patient with schizophrenia/cognitive difficulty at baseline per caregivers REVIEW OF SYSTEMS:  CONSTITUTIONAL: No fever, fatigue or weakness.  EYES: No blurred or double vision.  EARS, NOSE, AND THROAT: No tinnitus or ear pain.  RESPIRATORY: No cough, shortness of breath, wheezing or hemoptysis.  CARDIOVASCULAR: No chest pain, orthopnea, edema.  GASTROINTESTINAL: No nausea, vomiting, diarrhea or abdominal pain.  GENITOURINARY: No dysuria, hematuria.  ENDOCRINE: No polyuria, nocturia,  HEMATOLOGY: No anemia, easy bruising or bleeding SKIN: No rash or lesion. MUSCULOSKELETAL: No joint pain or arthritis.   NEUROLOGIC: No tingling, numbness, weakness.  PSYCHIATRY: No anxiety or depression.   ROS  DRUG ALLERGIES:  No Known Allergies  VITALS:  Blood pressure 117/60, pulse 89, temperature 99 F (37.2 C), temperature source Oral, resp. rate 18, height 5\' 5"  (1.651 m), weight 93.8 kg (206 lb 11.2 oz), SpO2 98 %.  PHYSICAL EXAMINATION:  GENERAL:  72 y.o.-year-old patient lying in the bed with no acute distress.  EYES: Pupils equal, round, reactive to light and accommodation. No scleral icterus. Extraocular muscles intact.  HEENT: Head atraumatic, normocephalic. Oropharynx and nasopharynx clear.  NECK:  Supple, no jugular venous distention. No thyroid enlargement, no tenderness.  LUNGS: Normal breath sounds bilaterally, no wheezing, rales,rhonchi or crepitation. No use of accessory muscles of respiration.  CARDIOVASCULAR: S1, S2 normal. No murmurs, rubs, or gallops.  ABDOMEN: Soft, nontender, nondistended. Bowel sounds present. No organomegaly or mass.  EXTREMITIES: No pedal edema, cyanosis, or clubbing.  NEUROLOGIC: Cranial nerves II through XII are intact. Muscle  strength 5/5 in all extremities. Sensation intact. Gait not checked.  PSYCHIATRIC: The patient is alert and oriented x 3.  SKIN: No obvious rash, lesion, or ulcer.   Physical Exam LABORATORY PANEL:   CBC Recent Labs  Lab 12/03/17 0436  WBC 20.1*  HGB 14.1  HCT 42.2  PLT 217   ------------------------------------------------------------------------------------------------------------------  Chemistries  Recent Labs  Lab 12/03/17 0436  NA 127*  K 3.2*  CL 98*  CO2 15*  GLUCOSE 175*  BUN 11  CREATININE 1.35*  CALCIUM 8.8*  AST 55*  ALT 23  ALKPHOS 47  BILITOT 1.4*   ------------------------------------------------------------------------------------------------------------------  Cardiac Enzymes Recent Labs  Lab 12/03/17 0848 12/03/17 1345  TROPONINI 0.21* 0.21*   ------------------------------------------------------------------------------------------------------------------  RADIOLOGY:  Dg Chest 2 View  Result Date: 12/03/2017 CLINICAL DATA:  Dizzy spells and diarrhea.  Fall.  Leg weakness. EXAM: CHEST  2 VIEW COMPARISON:  None. FINDINGS: Cardiomediastinal silhouette is normal. No pleural effusions or focal consolidations. LEFT lung base strandy densities. Trachea projects midline and there is no pneumothorax. Soft tissue planes and included osseous structures are non-suspicious. IMPRESSION: LEFT lung base atelectasis/scarring. Electronically Signed   By: Elon Alas M.D.   On: 12/03/2017 06:15   Ct Abdomen Pelvis W Contrast  Result Date: 12/03/2017 CLINICAL DATA:  Generalized abdominal pain. EXAM: CT ABDOMEN AND PELVIS WITH CONTRAST TECHNIQUE: Multidetector CT imaging of the abdomen and pelvis was performed using the standard protocol following bolus administration of intravenous contrast. CONTRAST:  28mL ISOVUE-300 IOPAMIDOL (ISOVUE-300) INJECTION 61% COMPARISON:  None. FINDINGS: Lower chest: Dependent subsegmental atelectasis identified. Lung bases are  otherwise normal. Hepatobiliary: No focal liver abnormality is seen. No gallstones, gallbladder wall thickening, or biliary dilatation. Pancreas: Unremarkable. No pancreatic  ductal dilatation or surrounding inflammatory changes. Spleen: Normal in size without focal abnormality. Adrenals/Urinary Tract: A tiny nodule seen in the right adrenal gland measuring 9 mm. There is nodularity in the left adrenal gland as well with a nodule on coronal image 56 measuring up to 12 mm. Bilateral renal cysts are identified. A nonobstructive stone is seen in the lower pole left kidney measuring 2 or 3 mm. Mild caliectasis bilaterally with no ureteral stones. The bladder is mildly distended. Stomach/Bowel: The stomach is normal. There is bulky masslike thickening in the cecum and ascending colon. The appendix is dilated and thickened with adjacent stranding consistent with appendicitis secondary to the cecal mass. No evidence of perforation. No appendicoliths. The appendix measures up to 2.1 cm distally. A loop of small bowel running inferior to the appendix is inflamed and thick walled. No small bowel obstruction or other small bowel abnormality. Vascular/Lymphatic: Atherosclerotic changes are seen in the nonaneurysmal aorta. A few mildly prominent pericecal nodes are identified such as on coronal image 51 and coronal image 30. No other adenopathy. Reproductive: Uterus and bilateral adnexa are unremarkable. Other: No free air. No omental or peritoneal abnormalities identified. Musculoskeletal: No acute or significant osseous findings. IMPRESSION: 1. 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 An adjacent loop of small bowel is inflamed as well, likely secondary to the cecal and appendiceal processes. Other causes such as inflammatory bowel disease are considered much less likely. The prominent peri cecal nodes could be  reactive or metastatic. 2. Nodularity in the adrenal glands could represent adenomas. However, given the suspicion for malignancy, the patient may benefit from a PET-CT or MRI for further evaluation of these nodules. 3. Bilateral caliectasis, likely due to the distended bladder. No underlying cause otherwise seen. Findings being called to referring physician Electronically Signed   By: Dorise Bullion III M.D   On: 12/03/2017 08:15    ASSESSMENT AND PLAN:  1 acute sepsis Secondary to acute large cecal mass with associated appendicitis Continue sepsis protocol, lactic acid now normal, empiric Rocephin/Flagyl, discontinue vancomycin, general surgery to see patient-no operative intervention at this time-recommended gastroenterology/oncology for expert opinion/tissue diagnosis, adult pain protocol, and continue close medical monitoring  2 acute appendicitis Plan of care as stated above  3 acute newly diagnosed cecal mass Plan of care as stated above  4 acute kidney injury Baseline renal function unknown IV fluids for rehydration, avoid nephrotoxic agents, strict I&O monitoring, check BMP in the morning  5 chronic schizophrenia Stable Continue home psychotropic regimen  6 chronic morbid obesity Most likely secondary to excess calories Lifestyle modification recommended  All the records are reviewed and case discussed with Care Management/Social Workerr. Management plans discussed with the patient, family and they are in agreement.  CODE STATUS: full  TOTAL TIME TAKING CARE OF THIS PATIENT: 45 minutes.     POSSIBLE D/C IN 3-7 DAYS, DEPENDING ON CLINICAL CONDITION.   Avel Peace Genise Strack M.D on 12/03/2017   Between 7am to 6pm - Pager - [phone]  After 6pm go to [email or web address] - password [email or web address] [facility]  Office  [phone]  CC: Primary care physician; Randel Pigg, MD  Note: This dictation was prepared with Dragon dictation along with smaller  phrase technology. Any transcriptional errors that result from this process are unintentional.

## 2017-12-04 ENCOUNTER — Inpatient Hospital Stay
Admit: 2017-12-04 | Discharge: 2017-12-04 | Disposition: A | Discharge status: Home / Self Care | Payer: Medicare Other | Attending: Family Medicine | Admitting: Family Medicine

## 2017-12-04 DIAGNOSIS — A419 Sepsis, unspecified organism: Secondary | ICD-10-CM

## 2017-12-04 DIAGNOSIS — K6389 Other specified diseases of intestine: Secondary | ICD-10-CM

## 2017-12-04 LAB — BASIC METABOLIC PANEL
Anion gap: 7 (ref 5–15)
BUN: 14 mg/dL (ref 6–20)
CALCIUM: 7.9 mg/dL — AB (ref 8.9–10.3)
CO2: 21 mmol/L — AB (ref 22–32)
CREATININE: 0.74 mg/dL (ref 0.44–1.00)
Chloride: 106 mmol/L (ref 101–111)
GFR calc Af Amer: >60 mL/min (ref >60)
GFR calc non Af Amer: >60 mL/min (ref >60)
GLUCOSE: 118 mg/dL — AB (ref 65–99)
Potassium: 3.8 mmol/L (ref 3.5–5.1)
Sodium: 134 mmol/L — ABNORMAL LOW (ref 135–145)

## 2017-12-04 LAB — CBC WITH DIFFERENTIAL/PLATELET
BASOS PCT: 0 %
Basophils Absolute: 0 10*3/uL (ref 0–0.1)
EOS ABS: 0 10*3/uL (ref 0–0.7)
Eosinophils Relative: 0 %
HEMATOCRIT: 36.6 % (ref 35.0–47.0)
Hemoglobin: 12.1 g/dL (ref 12.0–16.0)
Lymphocytes Relative: 6 %
Lymphs Abs: 0.8 10*3/uL — ABNORMAL LOW (ref 1.0–3.6)
MCH: 29.3 pg (ref 26.0–34.0)
MCHC: 33.2 g/dL (ref 32.0–36.0)
MCV: 88.4 fL (ref 80.0–100.0)
MONOS PCT: 7 %
Monocytes Absolute: 0.8 10*3/uL (ref 0.2–0.9)
Neutro Abs: 11 10*3/uL — ABNORMAL HIGH (ref 1.4–6.5)
Neutrophils Relative %: 87 %
Platelets: 179 10*3/uL (ref 150–440)
RBC: 4.13 MIL/uL (ref 3.80–5.20)
RDW: 13.7 % (ref 11.5–14.5)
WBC: 12.7 10*3/uL — ABNORMAL HIGH (ref 3.6–11.0)

## 2017-12-04 LAB — ECHOCARDIOGRAM COMPLETE
HEIGHTINCHES: 65 in
WEIGHTICAEL: 3307.2 [oz_av]

## 2017-12-04 MED ORDER — POTASSIUM CHLORIDE 2 MEQ/ML IV SOLN
INTRAVENOUS | Status: DC
  Administered 2017-12-04 – 2017-12-05 (×2): via INTRAVENOUS
  Filled 2017-12-04 (×5): qty 1000

## 2017-12-04 MED ORDER — PERFLUTREN LIPID MICROSPHERE
1.0000 mL | INTRAVENOUS | Status: AC | PRN
  Administered 2017-12-04: 4 mL via INTRAVENOUS
  Filled 2017-12-04: qty 10

## 2017-12-04 MED ORDER — NITROGLYCERIN 0.4 MG SL SUBL
0.4000 mg | SUBLINGUAL_TABLET | SUBLINGUAL | Status: DC | PRN
Start: 2017-12-04 — End: 2017-12-13

## 2017-12-04 MED ORDER — MORPHINE SULFATE (PF) 2 MG/ML IV SOLN
2.0000 mg | INTRAVENOUS | Status: DC | PRN
  Administered 2017-12-05: 2 mg via INTRAVENOUS
  Filled 2017-12-04: qty 1

## 2017-12-04 NOTE — Plan of Care (Signed)
  Progressing Education: Knowledge of General Education information will improve 12/04/2017 0259 - Progressing by Sonda Primes, RN Health Behavior/Discharge Planning: Ability to manage health-related needs will improve 12/04/2017 0259 - Progressing by Sonda Primes, RN Clinical Measurements: Ability to maintain clinical measurements within normal limits will improve 12/04/2017 0259 - Progressing by Sonda Primes, RN Will remain free from infection 12/04/2017 0259 - Progressing by Sonda Primes, RN Diagnostic test results will improve 12/04/2017 0259 - Progressing by Sonda Primes, RN Respiratory complications will improve 12/04/2017 0259 - Progressing by Sonda Primes, RN Cardiovascular complication will be avoided 12/04/2017 0259 - Progressing by Sonda Primes, RN Activity: Risk for activity intolerance will decrease 12/04/2017 0259 - Progressing by Sonda Primes, RN Nutrition: Adequate nutrition will be maintained 12/04/2017 0259 - Progressing by Sonda Primes, RN Coping: Level of anxiety will decrease 12/04/2017 0259 - Progressing by Sonda Primes, RN Elimination: Will not experience complications related to bowel motility 12/04/2017 0259 - Progressing by Sonda Primes, RN Will not experience complications related to urinary retention 12/04/2017 0259 - Progressing by Sonda Primes, RN Pain Managment: General experience of comfort will improve 12/04/2017 0259 - Progressing by Sonda Primes, RN Safety: Ability to remain free from injury will improve 12/04/2017 0259 - Progressing by Sonda Primes, RN Skin Integrity: Risk for impaired skin integrity will decrease 12/04/2017 0259 - Progressing by Sonda Primes, RN Spiritual Needs Ability to function at adequate level 12/04/2017 0259 - Progressing by Sonda Primes, RN Education: Knowledge of General Education information will improve 12/04/2017 0259 -  Progressing by Sonda Primes, RN Health Behavior/Discharge Planning: Ability to manage health-related needs will improve 12/04/2017 0259 - Progressing by Sonda Primes, RN Clinical Measurements: Ability to maintain clinical measurements within normal limits will improve 12/04/2017 0259 - Progressing by Sonda Primes, RN Will remain free from infection 12/04/2017 0259 - Progressing by Sonda Primes, RN Diagnostic test results will improve 12/04/2017 0259 - Progressing by Sonda Primes, RN Respiratory complications will improve 12/04/2017 0259 - Progressing by Sonda Primes, RN Cardiovascular complication will be avoided 12/04/2017 0259 - Progressing by Sonda Primes, RN Activity: Risk for activity intolerance will decrease 12/04/2017 0259 - Progressing by Sonda Primes, RN Nutrition: Adequate nutrition will be maintained 12/04/2017 0259 - Progressing by Sonda Primes, RN Coping: Level of anxiety will decrease 12/04/2017 0259 - Progressing by Sonda Primes, RN Elimination: Will not experience complications related to bowel motility 12/04/2017 0259 - Progressing by Sonda Primes, RN Will not experience complications related to urinary retention 12/04/2017 0259 - Progressing by Sonda Primes, RN Pain Managment: General experience of comfort will improve 12/04/2017 0259 - Progressing by Sonda Primes, RN Safety: Ability to remain free from injury will improve 12/04/2017 0259 - Progressing by Sonda Primes, RN Skin Integrity: Risk for impaired skin integrity will decrease 12/04/2017 0259 - Progressing by Sonda Primes, RN

## 2017-12-04 NOTE — Progress Notes (Signed)
CC: Abdominal pain Subjective: Patient admitted yesterday with abdominal pain found to be from large cecal mass causing secondary appendicitis.  Reports she continues to have some abdominal discomfort but given her mental status she is unable to state whether or not it is the same better or worse than yesterday.  She was resting comfortably during the visit.  Patient was febrile overnight to 101.8.  Objective: Vital signs in last 24 hours: Temp:  [99 F (37.2 C)-101.8 F (38.8 C)] 100.6 F (38.1 C) (01/13 0416) Pulse Rate:  [89-96] 96 (01/13 0416) Resp:  [18-20] 20 (01/13 0416) BP: (114-118)/(49-60) 118/49 (01/13 0416) SpO2:  [95 %-98 %] 95 % (01/13 0416) Last BM Date: 12/02/17  Intake/Output from previous day: 01/12 0701 - 01/13 0700 In: 1301.7 [I.V.:1201.7; IV Piggyback:100] Out: 500 [Urine:500] Intake/Output this shift: No intake/output data recorded.  Physical exam:  General: No acute distress Chest: Clear to auscultation Heart: Regular rate and rhythm Abdomen: Large, soft, tender to palpation in the right lower quadrant and periumbilical area.  No evidence of rebound or guarding.  Lab Results: CBC  Recent Labs    12/03/17 0436 12/04/17 0539  WBC 20.1* 12.7*  HGB 14.1 12.1  HCT 42.2 36.6  PLT 217 179   BMET Recent Labs    12/03/17 0436 12/04/17 0539  NA 127* 134*  K 3.2* 3.8  CL 98* 106  CO2 15* 21*  GLUCOSE 175* 118*  BUN 11 14  CREATININE 1.35* 0.74  CALCIUM 8.8* 7.9*   PT/INR No results for input(s): LABPROT, INR in the last 72 hours. ABG No results for input(s): PHART, HCO3 in the last 72 hours.  Invalid input(s): PCO2, PO2  Studies/Results: Dg Chest 2 View  Result Date: 12/03/2017 CLINICAL DATA:  Dizzy spells and diarrhea.  Fall.  Leg weakness. EXAM: CHEST  2 VIEW COMPARISON:  None. FINDINGS: Cardiomediastinal silhouette is normal. No pleural effusions or focal consolidations. LEFT lung base strandy densities. Trachea projects midline and  there is no pneumothorax. Soft tissue planes and included osseous structures are non-suspicious. IMPRESSION: LEFT lung base atelectasis/scarring. Electronically Signed   By: Elon Alas M.D.   On: 12/03/2017 06:15   Ct Chest Wo Contrast  Result Date: 12/03/2017 CLINICAL DATA:  Unresolved pneumonia EXAM: CT CHEST WITHOUT CONTRAST TECHNIQUE: Multidetector CT imaging of the chest was performed following the standard protocol without IV contrast. COMPARISON:  CT abdomen pelvis 12/03/2016 FINDINGS: Cardiovascular: Limited without intravenous contrast. Mild atherosclerotic calcification. No aneurysmal dilatation. Normal heart size. Small pericardial fluid Mediastinum/Nodes: Midline trachea. Coarse calcification left lobe of thyroid. Esophagus contains high density material within the distal lumen. Mild thickening at the GE junction. Subcentimeter mediastinal lymph nodes. Lungs/Pleura: 2 mm right upper lobe pulmonary nodule, series 3, image number 32. Trace pleural effusions. Linear atelectasis within the lower lobes. No consolidation. Upper Abdomen: High density material in the gallbladder may reflect vicarious contrast excretion versus small stones. Partially visible cysts in the left kidney. Mild excreted contrast in the left renal collecting system. Musculoskeletal: No acute or suspicious bone lesion. IMPRESSION: 1. Trace pleural effusions with linear atelectasis at both lower lobes. No consolidative infiltrate is seen. 2. Tiny 2 mm right upper lobe pulmonary nodule. Could consider 3 to six-month CT chest follow-up given findings on recent abdominal CT. 3. High density material within the distal lumen of the esophagus could be due to contrast or radiopaque ingested material. 4. Mild contrast or small stones in the gallbladder. Aortic Atherosclerosis (ICD10-I70.0). Electronically Signed   By: Maudie Mercury  Francoise Ceo M.D.   On: 12/03/2017 18:13   Ct Abdomen Pelvis W Contrast  Result Date: 12/03/2017 CLINICAL DATA:   Generalized abdominal pain. EXAM: CT ABDOMEN AND PELVIS WITH CONTRAST TECHNIQUE: Multidetector CT imaging of the abdomen and pelvis was performed using the standard protocol following bolus administration of intravenous contrast. CONTRAST:  39mL ISOVUE-300 IOPAMIDOL (ISOVUE-300) INJECTION 61% COMPARISON:  None. FINDINGS: Lower chest: Dependent subsegmental atelectasis identified. Lung bases are otherwise normal. Hepatobiliary: No focal liver abnormality is seen. No gallstones, gallbladder wall thickening, or biliary dilatation. Pancreas: Unremarkable. No pancreatic ductal dilatation or surrounding inflammatory changes. Spleen: Normal in size without focal abnormality. Adrenals/Urinary Tract: A tiny nodule seen in the right adrenal gland measuring 9 mm. There is nodularity in the left adrenal gland as well with a nodule on coronal image 56 measuring up to 12 mm. Bilateral renal cysts are identified. A nonobstructive stone is seen in the lower pole left kidney measuring 2 or 3 mm. Mild caliectasis bilaterally with no ureteral stones. The bladder is mildly distended. Stomach/Bowel: The stomach is normal. There is bulky masslike thickening in the cecum and ascending colon. The appendix is dilated and thickened with adjacent stranding consistent with appendicitis secondary to the cecal mass. No evidence of perforation. No appendicoliths. The appendix measures up to 2.1 cm distally. A loop of small bowel running inferior to the appendix is inflamed and thick walled. No small bowel obstruction or other small bowel abnormality. Vascular/Lymphatic: Atherosclerotic changes are seen in the nonaneurysmal aorta. A few mildly prominent pericecal nodes are identified such as on coronal image 51 and coronal image 30. No other adenopathy. Reproductive: Uterus and bilateral adnexa are unremarkable. Other: No free air. No omental or peritoneal abnormalities identified. Musculoskeletal: No acute or significant osseous findings.  IMPRESSION: 1. 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 An adjacent loop of small bowel is inflamed as well, likely secondary to the cecal and appendiceal processes. Other causes such as inflammatory bowel disease are considered much less likely. The prominent peri cecal nodes could be reactive or metastatic. 2. Nodularity in the adrenal glands could represent adenomas. However, given the suspicion for malignancy, the patient may benefit from a PET-CT or MRI for further evaluation of these nodules. 3. Bilateral caliectasis, likely due to the distended bladder. No underlying cause otherwise seen. Findings being called to referring physician Electronically Signed   By: Dorise Bullion III M.D   On: 12/03/2017 08:15    Anti-infectives: Anti-infectives (From admission, onward)   Start     Dose/Rate Route Frequency Ordered Stop   12/04/17 1000  cefTRIAXone (ROCEPHIN) 2 g in dextrose 5 % 50 mL IVPB     2 g 100 mL/hr over 30 Minutes Intravenous Every 24 hours 12/03/17 0704     12/03/17 0900  metroNIDAZOLE (FLAGYL) IVPB 500 mg     500 mg 100 mL/hr over 60 Minutes Intravenous Every 8 hours 12/03/17 0847     12/03/17 0700  cefTRIAXone (ROCEPHIN) 2 g in dextrose 5 % 50 mL IVPB     2 g 100 mL/hr over 30 Minutes Intravenous  Once 12/03/17 0650 12/03/17 0752   12/03/17 0700  azithromycin (ZITHROMAX) 500 mg in dextrose 5 % 250 mL IVPB     500 mg 250 mL/hr over 60 Minutes Intravenous  Once 12/03/17 0650 12/03/17 0836      Assessment/Plan:  72 year old female with a large cecal mass causing secondary  appendicitis.  Appears to be responding to antibiotics with an improving white blood cell count.  She will likely require a right hemicolectomy during this hospital stay.  Would continue resuscitative efforts today with IV fluids and IV antibiotics.  General surgery will continue to follow along, the  service will be transferred to Dr. Burt Knack in the morning.  Loletha Bertini T. Adonis Huguenin, MD, [facility] General Surgeon [facility]  Day [facility] [id number] Night [facility] [phone] 12/04/2017

## 2017-12-04 NOTE — Progress Notes (Signed)
*  PRELIMINARY RESULTS* Echocardiogram 2D Echocardiogram has been performed. Definity IV Contrast used on this study.  Latoya Jones 12/04/2017, 2:19 PM

## 2017-12-04 NOTE — Consult Note (Signed)
Latoya Jones is a 72 y.o. female  [id number]  Primary Cardiologist: Neoma Laming Reason for Consultation: Abdominal pain and elevated troponin and CHF  HPI: This is a 72 year old white female who presented to the hospital with abdominal pain and shortness of breath. She had elevated troponin thus I was asked to evaluate the patient. She also had dizziness and was tachycardic in the emergency room.   Review of Systems: No chest pain but does have abdominal pain and dizziness   History reviewed. No pertinent past medical history.  Medications Prior to Admission  Medication Sig Dispense Refill  . acetaminophen (TYLENOL) 500 MG tablet Take 1,000 mg by mouth every 8 (eight) hours as needed for mild pain.    Marland Kitchen alendronate (FOSAMAX) 70 MG tablet Take 70 mg by mouth once a week.    . ARIPiprazole (ABILIFY) 5 MG tablet Take 5 mg by mouth at bedtime.     . benztropine (COGENTIN) 0.5 MG tablet Take 0.5 mg by mouth at bedtime.    . Calcium 600-200 MG-UNIT tablet Take 1 tablet by mouth daily.    . calcium carbonate (TUMS - DOSED IN MG ELEMENTAL CALCIUM) 500 MG chewable tablet Chew 1 tablet by mouth 2 (two) times daily as needed for indigestion or heartburn.    . divalproex (DEPAKOTE) 500 MG DR tablet Take 500 mg by mouth at bedtime.     . memantine (NAMENDA XR) 14 MG CP24 24 hr capsule Take 14 mg by mouth at bedtime.     Marland Kitchen perphenazine (TRILAFON) 4 MG tablet Take 4 mg by mouth 2 (two) times daily.       . ARIPiprazole  5 mg Oral QHS  . benztropine  0.5 mg Oral QHS  . calcium-vitamin D  1 tablet Oral Daily  . divalproex  500 mg Oral QHS  . docusate sodium  100 mg Oral BID  . memantine  14 mg Oral QHS  . perphenazine  4 mg Oral BID  . pneumococcal 23 valent vaccine  0.5 mL Intramuscular Tomorrow-1000    Infusions: . cefTRIAXone (ROCEPHIN) IVPB 2 gram/50 mL D5W (Pyxis) Stopped (12/04/17 1224)  . dextrose 5 % and 0.9% NaCl 1,000 mL with potassium chloride 20 mEq infusion    .  metronidazole 500 mg (12/04/17 1737)  . sodium chloride      No Known Allergies  Social History   Socioeconomic History  . Marital status: Widowed    Spouse name: Not on file  . Number of children: Not on file  . Years of education: Not on file  . Highest education level: Not on file  Social Needs  . Financial resource strain: Patient refused  . Food insecurity - worry: Patient refused  . Food insecurity - inability: Patient refused  . Transportation needs - medical: Patient refused  . Transportation needs - non-medical: Patient refused  Occupational History  . Not on file  Tobacco Use  . Smoking status: Former Research scientist (life sciences)  . Smokeless tobacco: Never Used  Substance and Sexual Activity  . Alcohol use: No  . Drug use: No  . Sexual activity: No  Other Topics Concern  . Not on file  Social History Narrative  . Not on file    History reviewed. No pertinent family history.  PHYSICAL EXAM: Vitals:   12/04/17 1000 12/04/17 1242  BP: (!) 118/57 (!) 120/58  Pulse: 89 89  Resp:  16  Temp: 99.9 F (37.7 C) 99.4 F (37.4 C)  SpO2: 98% 99%  Intake/Output Summary (Last 24 hours) at 12/04/2017 1819 Last data filed at 12/04/2017 0500 Gross per 24 hour  Intake 1301.67 ml  Output 500 ml  Net 801.67 ml    General:  Well appearing. No respiratory difficulty HEENT: normal Neck: supple. no JVD. Carotids 2+ bilat; no bruits. No lymphadenopathy or thryomegaly appreciated. Cor: PMI nondisplaced. Regular rate & rhythm. No rubs, gallops or murmurs. Lungs: clear Abdomen: soft, nontender, nondistended. No hepatosplenomegaly. No bruits or masses. Good bowel sounds. Extremities: no cyanosis, clubbing, rash, edema Neuro: alert & oriented x 3, cranial nerves grossly intact. moves all 4 extremities w/o difficulty. Affect pleasant.  ECG: Sinus tachycardia with nonspecific ST-T changes  Results for orders placed or performed during the hospital encounter of 12/03/17 (from the past 24  hour(s))  Troponin I     Status: Abnormal   Collection Time: 12/03/17  8:26 PM  Result Value Ref Range   Troponin I 0.16 (HH) <0.03 ng/mL  Basic metabolic panel     Status: Abnormal   Collection Time: 12/04/17  5:39 AM  Result Value Ref Range   Sodium 134 (L) 135 - 145 mmol/L   Potassium 3.8 3.5 - 5.1 mmol/L   Chloride 106 101 - 111 mmol/L   CO2 21 (L) 22 - 32 mmol/L   Glucose, Bld 118 (H) 65 - 99 mg/dL   BUN 14 6 - 20 mg/dL   Creatinine, Ser 0.74 0.44 - 1.00 mg/dL   Calcium 7.9 (L) 8.9 - 10.3 mg/dL   GFR calc non Af Amer >60 >60 mL/min   GFR calc Af Amer >60 >60 mL/min   Anion gap 7 5 - 15  CBC with Differential/Platelet     Status: Abnormal   Collection Time: 12/04/17  5:39 AM  Result Value Ref Range   WBC 12.7 (H) 3.6 - 11.0 K/uL   RBC 4.13 3.80 - 5.20 MIL/uL   Hemoglobin 12.1 12.0 - 16.0 g/dL   HCT 36.6 35.0 - 47.0 %   MCV 88.4 80.0 - 100.0 fL   MCH 29.3 26.0 - 34.0 pg   MCHC 33.2 32.0 - 36.0 g/dL   RDW 13.7 11.5 - 14.5 %   Platelets 179 150 - 440 K/uL   Neutrophils Relative % 87 %   Neutro Abs 11.0 (H) 1.4 - 6.5 K/uL   Lymphocytes Relative 6 %   Lymphs Abs 0.8 (L) 1.0 - 3.6 K/uL   Monocytes Relative 7 %   Monocytes Absolute 0.8 0.2 - 0.9 K/uL   Eosinophils Relative 0 %   Eosinophils Absolute 0.0 0 - 0.7 K/uL   Basophils Relative 0 %   Basophils Absolute 0.0 0 - 0.1 K/uL   Dg Chest 2 View  Result Date: 12/03/2017 CLINICAL DATA:  Dizzy spells and diarrhea.  Fall.  Leg weakness. EXAM: CHEST  2 VIEW COMPARISON:  None. FINDINGS: Cardiomediastinal silhouette is normal. No pleural effusions or focal consolidations. LEFT lung base strandy densities. Trachea projects midline and there is no pneumothorax. Soft tissue planes and included osseous structures are non-suspicious. IMPRESSION: LEFT lung base atelectasis/scarring. Electronically Signed   By: Elon Alas M.D.   On: 12/03/2017 06:15   Ct Chest Wo Contrast  Result Date: 12/03/2017 CLINICAL DATA:  Unresolved  pneumonia EXAM: CT CHEST WITHOUT CONTRAST TECHNIQUE: Multidetector CT imaging of the chest was performed following the standard protocol without IV contrast. COMPARISON:  CT abdomen pelvis 12/03/2016 FINDINGS: Cardiovascular: Limited without intravenous contrast. Mild atherosclerotic calcification. No aneurysmal dilatation. Normal heart size. Small pericardial  fluid Mediastinum/Nodes: Midline trachea. Coarse calcification left lobe of thyroid. Esophagus contains high density material within the distal lumen. Mild thickening at the GE junction. Subcentimeter mediastinal lymph nodes. Lungs/Pleura: 2 mm right upper lobe pulmonary nodule, series 3, image number 32. Trace pleural effusions. Linear atelectasis within the lower lobes. No consolidation. Upper Abdomen: High density material in the gallbladder may reflect vicarious contrast excretion versus small stones. Partially visible cysts in the left kidney. Mild excreted contrast in the left renal collecting system. Musculoskeletal: No acute or suspicious bone lesion. IMPRESSION: 1. Trace pleural effusions with linear atelectasis at both lower lobes. No consolidative infiltrate is seen. 2. Tiny 2 mm right upper lobe pulmonary nodule. Could consider 3 to six-month CT chest follow-up given findings on recent abdominal CT. 3. High density material within the distal lumen of the esophagus could be due to contrast or radiopaque ingested material. 4. Mild contrast or small stones in the gallbladder. Aortic Atherosclerosis (ICD10-I70.0). Electronically Signed   By: Donavan Foil M.D.   On: 12/03/2017 18:13   Ct Abdomen Pelvis W Contrast  Result Date: 12/03/2017 CLINICAL DATA:  Generalized abdominal pain. EXAM: CT ABDOMEN AND PELVIS WITH CONTRAST TECHNIQUE: Multidetector CT imaging of the abdomen and pelvis was performed using the standard protocol following bolus administration of intravenous contrast. CONTRAST:  34mL ISOVUE-300 IOPAMIDOL (ISOVUE-300) INJECTION 61%  COMPARISON:  None. FINDINGS: Lower chest: Dependent subsegmental atelectasis identified. Lung bases are otherwise normal. Hepatobiliary: No focal liver abnormality is seen. No gallstones, gallbladder wall thickening, or biliary dilatation. Pancreas: Unremarkable. No pancreatic ductal dilatation or surrounding inflammatory changes. Spleen: Normal in size without focal abnormality. Adrenals/Urinary Tract: A tiny nodule seen in the right adrenal gland measuring 9 mm. There is nodularity in the left adrenal gland as well with a nodule on coronal image 56 measuring up to 12 mm. Bilateral renal cysts are identified. A nonobstructive stone is seen in the lower pole left kidney measuring 2 or 3 mm. Mild caliectasis bilaterally with no ureteral stones. The bladder is mildly distended. Stomach/Bowel: The stomach is normal. There is bulky masslike thickening in the cecum and ascending colon. The appendix is dilated and thickened with adjacent stranding consistent with appendicitis secondary to the cecal mass. No evidence of perforation. No appendicoliths. The appendix measures up to 2.1 cm distally. A loop of small bowel running inferior to the appendix is inflamed and thick walled. No small bowel obstruction or other small bowel abnormality. Vascular/Lymphatic: Atherosclerotic changes are seen in the nonaneurysmal aorta. A few mildly prominent pericecal nodes are identified such as on coronal image 51 and coronal image 30. No other adenopathy. Reproductive: Uterus and bilateral adnexa are unremarkable. Other: No free air. No omental or peritoneal abnormalities identified. Musculoskeletal: No acute or significant osseous findings. IMPRESSION: 1. 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 An adjacent loop of small bowel is inflamed as well, likely secondary to the cecal and appendiceal processes. Other causes such as  inflammatory bowel disease are considered much less likely. The prominent peri cecal nodes could be reactive or metastatic. 2. Nodularity in the adrenal glands could represent adenomas. However, given the suspicion for malignancy, the patient may benefit from a PET-CT or MRI for further evaluation of these nodules. 3. Bilateral caliectasis, likely due to the distended bladder. No underlying cause otherwise seen. Findings being called to referring physician Electronically Signed   By: Dorise Bullion III M.D  On: 12/03/2017 08:15     ASSESSMENT AND PLAN: Elevated troponin with abdominal pain and shortness of breath and possible sepsis and dehydration. We'll get wall motion abnormality evaluated by echocardiogram. If there is wall motion probably has coronary artery disease and will need to do further workup.  Latoya Jones A

## 2017-12-04 NOTE — NC FL2 (Signed)
[place] LEVEL OF CARE SCREENING TOOL     IDENTIFICATION  Patient Name: Latoya Jones Birthdate: Mar 27, 1946 Sex: female Admission Date (Current Location): 12/03/2017  [place] and [place] Number:  [name] [id number] [place] and Address:  [facility], [address], [place], [place] [address]      Provider Number: [id number]  Attending Physician Name and Address:  Gorden Harms, MD  Relative Name and Phone Number:  Ainsley Spinner [facility]) [phone]    Current Level of Care: Hospital Recommended Level of Care: [place] Prior Approval Number:    Date Approved/Denied:   PASRR Number:    Discharge Plan: Domiciliary (Rest home)([facility])    Current Diagnoses: Patient Active Problem List   Diagnosis Date Noted  . Sepsis ([place]) 12/03/2017  . Colonic mass     Orientation RESPIRATION BLADDER Height & Weight     Self, Time, Situation, Place  Normal Continent Weight: 206 lb 11.2 oz (93.8 kg) Height:  5\' 5"  (165.1 cm)  BEHAVIORAL SYMPTOMS/MOOD NEUROLOGICAL BOWEL NUTRITION STATUS      Continent    AMBULATORY STATUS COMMUNICATION OF NEEDS Skin     Verbally Surgical wounds                       Personal Care Assistance Level of Assistance  Bathing, Feeding, Dressing Bathing Assistance: Independent Feeding assistance: Independent Dressing Assistance: Independent     Functional Limitations Info             SPECIAL CARE FACTORS FREQUENCY                       Contractures Contractures Info: Not present    Additional Factors Info  Code Status, Allergies, Psychotropic Code Status Info: Full Allergies Info: No Known Allergies Psychotropic Info: Namenda, Abilify, Cogentin, Depakote         Current Medications (12/04/2017):  This is the current hospital active medication list Current Facility-Administered Medications  Medication Dose Route Frequency Provider Last Rate Last  Dose  . acetaminophen (TYLENOL) tablet 650 mg  650 mg Oral Q6H PRN Harrie Foreman, MD   650 mg at 12/04/17 1245   Or  . acetaminophen (TYLENOL) suppository 650 mg  650 mg Rectal Q6H PRN Harrie Foreman, MD      . ARIPiprazole (ABILIFY) tablet 5 mg  5 mg Oral QHS Harrie Foreman, MD   5 mg at 12/03/17 2105  . benztropine (COGENTIN) tablet 0.5 mg  0.5 mg Oral QHS Harrie Foreman, MD   0.5 mg at 12/03/17 2105  . calcium carbonate (TUMS - dosed in mg elemental calcium) chewable tablet 200 mg of elemental calcium  1 tablet Oral BID PRN Harrie Foreman, MD      . calcium-vitamin D (OSCAL WITH D) 500-200 MG-UNIT per tablet 1 tablet  1 tablet Oral Daily Harrie Foreman, MD   1 tablet at 12/04/17 1050  . cefTRIAXone (ROCEPHIN) 2 g in dextrose 5 % 50 mL IVPB  2 g Intravenous Q24H Loney Hering, MD   Stopped at 12/04/17 1224  . dextrose 5 % and 0.9 % NaCl with KCl 20 mEq/L infusion   Intravenous Continuous Salary, Montell D, MD 100 mL/hr at 12/04/17 1200    . divalproex (DEPAKOTE) DR tablet 500 mg  500 mg Oral QHS Harrie Foreman, MD   500 mg at 12/03/17 2105  . docusate sodium (COLACE) capsule 100 mg  100 mg Oral BID Harrie Foreman, MD   100 mg at 12/04/17 1050  . memantine (NAMENDA XR) 24 hr capsule 14 mg  14 mg Oral QHS Harrie Foreman, MD   14 mg at 12/03/17 2105  . metroNIDAZOLE (FLAGYL) IVPB 500 mg  500 mg Intravenous Q8H Salary, Avel Peace, MD   Stopped at 12/04/17 1139  . morphine 2 MG/ML injection 2 mg  2 mg Intravenous Q2H PRN Salary, Montell D, MD      . nitroGLYCERIN (NITROSTAT) SL tablet 0.4 mg  0.4 mg Sublingual Q5 min PRN Salary, Montell D, MD      . ondansetron (ZOFRAN) tablet 4 mg  4 mg Oral Q6H PRN Harrie Foreman, MD       Or  . ondansetron [facility]) injection 4 mg  4 mg Intravenous Q6H PRN Harrie Foreman, MD      . perphenazine (TRILAFON) tablet 4 mg  4 mg Oral BID Harrie Foreman, MD   4 mg at 12/04/17 1051  . pneumococcal 23 valent vaccine  (PNU-IMMUNE) injection 0.5 mL  0.5 mL Intramuscular Tomorrow-1000 Salary, Montell D, MD      . sodium chloride 0.9 % bolus 1,000 mL  1,000 mL Intravenous Once Dahlia Client Eduard Roux, MD       Facility-Administered Medications Ordered in Other Encounters  Medication Dose Route Frequency Provider Last Rate Last Dose  . perflutren lipid microspheres (DEFINITY) IV suspension  1-10 mL Intravenous PRN Salary, Holly Bodily D, MD   4 mL at 12/04/17 1418     Discharge Medications: Please see discharge summary for a list of discharge medications.  Relevant Imaging Results:  Relevant Lab Results:   Additional Information [id number]  Zettie Pho, LCSW

## 2017-12-04 NOTE — Clinical Social Work Note (Signed)
Clinical Social Work Assessment  Patient Details  Name: Latoya Jones MRN: [id number] Date of Birth: Oct 25, 1946  Date of referral:  12/04/17               Reason for consult:  Facility Placement                Permission sought to share information with:  Chartered certified accountant granted to share information::  Yes, Verbal Permission Granted  Name::        Agency::  [facility]  Relationship::     Contact Information:     Housing/Transportation Living arrangements for Latoya past 2 months:  [place] of Information:  Patient, Medical Team, Facility Patient Interpreter Needed:  None Criminal Activity/Legal Involvement Pertinent to Current Situation/Hospitalization:  No - Comment as needed Significant Relationships:  Latoya Jones, Siblings Lives with:  Facility Resident Do you feel safe going back to Latoya place where you live?  Yes Need for family participation in patient care:  No (Coment)  Care giving concerns:  Patient admitted from a Lat[place] Worker assessment / plan:  CSW spoke with Latoya patient at bedside who stated that her plan is to return to her family care home when stable, and she gave verbal permission to contact Latoya Latoya Jones. CSW contacted Latoya Jones who confirmed residency and ability to return when stable. Latoya Jones will transport Latoya patient as long as she is able to ambulate.   Latoya discharge timing/disposition is unknown as of now. CSW will follow to facilitate discharge and communicate with Latoya Latoya Jones. Employment status:  Retired Nurse, adult PT Recommendations:  Not assessed at this time Information / Referral to community resources:     Patient/Family's Response to care:  Latoya patient and Latoya Jones thanked Latoya CSW.  Patient/Family's Understanding of and Emotional Response to Diagnosis, Current Treatment, and Prognosis:  Latoya patient does not have a  guardian and wishes to return to her Latoya Jones.  Emotional Assessment Appearance:  Appears stated age Attitude/Demeanor/Rapport:  Lethargic Affect (typically observed):  Stable Orientation:  Oriented to Self, Oriented to Place, Oriented to  Time, Oriented to Situation Alcohol / Substance use:  Never Used Psych involvement (Current and /or in Latoya community):  No (Comment)  Discharge Needs  Concerns to be addressed:  Care Coordination Readmission within Latoya last 30 days:  No Current discharge risk:  None Barriers to Discharge:  Continued Medical Work up   [employer]oya [employer], LCSW 12/04/2017, 3:40 PM

## 2017-12-04 NOTE — Progress Notes (Addendum)
[facility] NAME: Latoya Jones    MR#:  [id number]  DATE OF BIRTH:  [birth year]  SUBJECTIVE:  CHIEF COMPLAINT:   Chief Complaint  Patient presents with  . Dizziness  Complains of lower abdominal pain only  REVIEW OF SYSTEMS:  CONSTITUTIONAL: No fever, fatigue or weakness.  EYES: No blurred or double vision.  EARS, NOSE, AND THROAT: No tinnitus or ear pain.  RESPIRATORY: No cough, shortness of breath, wheezing or hemoptysis.  CARDIOVASCULAR: No chest pain, orthopnea, edema.  GASTROINTESTINAL: No nausea, vomiting, diarrhea or abdominal pain.  GENITOURINARY: No dysuria, hematuria.  ENDOCRINE: No polyuria, nocturia,  HEMATOLOGY: No anemia, easy bruising or bleeding SKIN: No rash or lesion. MUSCULOSKELETAL: No joint pain or arthritis.   NEUROLOGIC: No tingling, numbness, weakness.  PSYCHIATRY: No anxiety or depression.   ROS  DRUG ALLERGIES:  No Known Allergies  VITALS:  Blood pressure (!) 120/58, pulse 89, temperature 99.4 F (37.4 C), temperature source Oral, resp. rate 16, height 5\' 5"  (1.651 m), weight 93.8 kg (206 lb 11.2 oz), SpO2 99 %.  PHYSICAL EXAMINATION:  GENERAL:  72 y.o.-year-old patient lying in the bed with no acute distress.  EYES: Pupils equal, round, reactive to light and accommodation. No scleral icterus. Extraocular muscles intact.  HEENT: Head atraumatic, normocephalic. Oropharynx and nasopharynx clear.  NECK:  Supple, no jugular venous distention. No thyroid enlargement, no tenderness.  LUNGS: Normal breath sounds bilaterally, no wheezing, rales,rhonchi or crepitation. No use of accessory muscles of respiration.  CARDIOVASCULAR: S1, S2 normal. No murmurs, rubs, or gallops.  ABDOMEN: Soft, nontender, nondistended. Bowel sounds present. No organomegaly or mass.  EXTREMITIES: No pedal edema, cyanosis, or clubbing.  NEUROLOGIC: Cranial nerves II through XII are intact. Muscle strength 5/5 in all  extremities. Sensation intact. Gait not checked.  PSYCHIATRIC: The patient is alert and oriented x 3.  SKIN: No obvious rash, lesion, or ulcer.   Physical Exam LABORATORY PANEL:   CBC Recent Labs  Lab 12/04/17 0539  WBC 12.7*  HGB 12.1  HCT 36.6  PLT 179   ------------------------------------------------------------------------------------------------------------------  Chemistries  Recent Labs  Lab 12/03/17 0436 12/04/17 0539  NA 127* 134*  K 3.2* 3.8  CL 98* 106  CO2 15* 21*  GLUCOSE 175* 118*  BUN 11 14  CREATININE 1.35* 0.74  CALCIUM 8.8* 7.9*  AST 55*  --   ALT 23  --   ALKPHOS 47  --   BILITOT 1.4*  --    ------------------------------------------------------------------------------------------------------------------  Cardiac Enzymes Recent Labs  Lab 12/03/17 1345 12/03/17 2026  TROPONINI 0.21* 0.16*   ------------------------------------------------------------------------------------------------------------------  RADIOLOGY:  Dg Chest 2 View  Result Date: 12/03/2017 CLINICAL DATA:  Dizzy spells and diarrhea.  Fall.  Leg weakness. EXAM: CHEST  2 VIEW COMPARISON:  None. FINDINGS: Cardiomediastinal silhouette is normal. No pleural effusions or focal consolidations. LEFT lung base strandy densities. Trachea projects midline and there is no pneumothorax. Soft tissue planes and included osseous structures are non-suspicious. IMPRESSION: LEFT lung base atelectasis/scarring. Electronically Signed   By: Elon Alas M.D.   On: 12/03/2017 06:15   Ct Chest Wo Contrast  Result Date: 12/03/2017 CLINICAL DATA:  Unresolved pneumonia EXAM: CT CHEST WITHOUT CONTRAST TECHNIQUE: Multidetector CT imaging of the chest was performed following the standard protocol without IV contrast. COMPARISON:  CT abdomen pelvis 12/03/2016 FINDINGS: Cardiovascular: Limited without intravenous contrast. Mild atherosclerotic calcification. No aneurysmal dilatation. Normal heart size.  Small pericardial fluid Mediastinum/Nodes: Midline trachea. Coarse calcification left  lobe of thyroid. Esophagus contains high density material within the distal lumen. Mild thickening at the GE junction. Subcentimeter mediastinal lymph nodes. Lungs/Pleura: 2 mm right upper lobe pulmonary nodule, series 3, image number 32. Trace pleural effusions. Linear atelectasis within the lower lobes. No consolidation. Upper Abdomen: High density material in the gallbladder may reflect vicarious contrast excretion versus small stones. Partially visible cysts in the left kidney. Mild excreted contrast in the left renal collecting system. Musculoskeletal: No acute or suspicious bone lesion. IMPRESSION: 1. Trace pleural effusions with linear atelectasis at both lower lobes. No consolidative infiltrate is seen. 2. Tiny 2 mm right upper lobe pulmonary nodule. Could consider 3 to six-month CT chest follow-up given findings on recent abdominal CT. 3. High density material within the distal lumen of the esophagus could be due to contrast or radiopaque ingested material. 4. Mild contrast or small stones in the gallbladder. Aortic Atherosclerosis (ICD10-I70.0). Electronically Signed   By: Donavan Foil M.D.   On: 12/03/2017 18:13   Ct Abdomen Pelvis W Contrast  Result Date: 12/03/2017 CLINICAL DATA:  Generalized abdominal pain. EXAM: CT ABDOMEN AND PELVIS WITH CONTRAST TECHNIQUE: Multidetector CT imaging of the abdomen and pelvis was performed using the standard protocol following bolus administration of intravenous contrast. CONTRAST:  6mL ISOVUE-300 IOPAMIDOL (ISOVUE-300) INJECTION 61% COMPARISON:  None. FINDINGS: Lower chest: Dependent subsegmental atelectasis identified. Lung bases are otherwise normal. Hepatobiliary: No focal liver abnormality is seen. No gallstones, gallbladder wall thickening, or biliary dilatation. Pancreas: Unremarkable. No pancreatic ductal dilatation or surrounding inflammatory changes. Spleen: Normal in  size without focal abnormality. Adrenals/Urinary Tract: A tiny nodule seen in the right adrenal gland measuring 9 mm. There is nodularity in the left adrenal gland as well with a nodule on coronal image 56 measuring up to 12 mm. Bilateral renal cysts are identified. A nonobstructive stone is seen in the lower pole left kidney measuring 2 or 3 mm. Mild caliectasis bilaterally with no ureteral stones. The bladder is mildly distended. Stomach/Bowel: The stomach is normal. There is bulky masslike thickening in the cecum and ascending colon. The appendix is dilated and thickened with adjacent stranding consistent with appendicitis secondary to the cecal mass. No evidence of perforation. No appendicoliths. The appendix measures up to 2.1 cm distally. A loop of small bowel running inferior to the appendix is inflamed and thick walled. No small bowel obstruction or other small bowel abnormality. Vascular/Lymphatic: Atherosclerotic changes are seen in the nonaneurysmal aorta. A few mildly prominent pericecal nodes are identified such as on coronal image 51 and coronal image 30. No other adenopathy. Reproductive: Uterus and bilateral adnexa are unremarkable. Other: No free air. No omental or peritoneal abnormalities identified. Musculoskeletal: No acute or significant osseous findings. IMPRESSION: 1. 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 An adjacent loop of small bowel is inflamed as well, likely secondary to the cecal and appendiceal processes. Other causes such as inflammatory bowel disease are considered much less likely. The prominent peri cecal nodes could be reactive or metastatic. 2. Nodularity in the adrenal glands could represent adenomas. However, given the suspicion for malignancy, the patient may benefit from a PET-CT or MRI for further evaluation of these nodules. 3. Bilateral caliectasis, likely due  to the distended bladder. No underlying cause otherwise seen. Findings being called to referring physician Electronically Signed   By: Dorise Bullion III M.D   On: 12/03/2017 08:15    ASSESSMENT  AND PLAN:  1 acute sepsis Secondary to acute large cecal mass with associated acute appendicitis Continue sepsis protocol, empiric Rocephin/Flagyl for now, general surgery input appreciated-we will require most likely right hemicolectomy while inpatient per surgery, GI/oncology following, and continue close medical monitoring  2 acute appendicitis Stable Plan of care as stated above  3 acute newly diagnosed cecal mass Plan of care as stated above  4 acute kidney injury Resolved with IV fluids for rehydration Baseline renal function unknown  5 chronic schizophrenia Stable Continue home psychotropic regiment  6 chronic morbid obesity Most likely secondary to excess calories Lifestyle modification recommended  7 elevated troponins Most likely secondary to sepsis/demand ischemia Check echocardiogram and consult cardiology for expert opinion  All the records are reviewed and case discussed with Care Management/Social Workerr. Management plans discussed with the patient, family and they are in agreement.  CODE STATUS: full  TOTAL TIME TAKING CARE OF THIS PATIENT: 40 minutes.     POSSIBLE D/C IN 7-10 DAYS, DEPENDING ON CLINICAL CONDITION.   Latoya Jones M.D on 12/04/2017   Between 7am to 6pm - Pager - [phone]  After 6pm go to [email or web address] - password [email or web address] [facility]  Office  [phone]  CC: Primary care physician; Randel Pigg, MD  Note: This dictation was prepared with Dragon dictation along with smaller phrase technology. Any transcriptional errors that result from this process are unintentional.

## 2017-12-04 NOTE — Clinical Social Work Note (Signed)
CSW received consult that this patient admitted from a family care home. CSW will assess when able.  Santiago Bumpers, MSW, Latanya Presser [phone]

## 2017-12-05 DIAGNOSIS — E872 Acidosis, unspecified: Secondary | ICD-10-CM

## 2017-12-05 LAB — CBC WITH DIFFERENTIAL/PLATELET
Basophils Absolute: 0 10*3/uL (ref 0–0.1)
Basophils Relative: 0 %
EOS ABS: 0.2 10*3/uL (ref 0–0.7)
Eosinophils Relative: 1 %
HEMATOCRIT: 36.3 % (ref 35.0–47.0)
HEMOGLOBIN: 12.1 g/dL (ref 12.0–16.0)
LYMPHS ABS: 0.8 10*3/uL — AB (ref 1.0–3.6)
LYMPHS PCT: 7 %
MCH: 29.6 pg (ref 26.0–34.0)
MCHC: 33.3 g/dL (ref 32.0–36.0)
MCV: 88.8 fL (ref 80.0–100.0)
Monocytes Absolute: 1 10*3/uL — ABNORMAL HIGH (ref 0.2–0.9)
Monocytes Relative: 10 %
NEUTROS PCT: 82 %
Neutro Abs: 8.8 10*3/uL — ABNORMAL HIGH (ref 1.4–6.5)
Platelets: 185 10*3/uL (ref 150–440)
RBC: 4.08 MIL/uL (ref 3.80–5.20)
RDW: 13.3 % (ref 11.5–14.5)
WBC: 10.8 10*3/uL (ref 3.6–11.0)

## 2017-12-05 LAB — BASIC METABOLIC PANEL
Anion gap: 7 (ref 5–15)
BUN: 12 mg/dL (ref 6–20)
CHLORIDE: 109 mmol/L (ref 101–111)
CO2: 21 mmol/L — AB (ref 22–32)
CREATININE: 0.7 mg/dL (ref 0.44–1.00)
Calcium: 8.1 mg/dL — ABNORMAL LOW (ref 8.9–10.3)
GFR calc non Af Amer: >60 mL/min (ref >60)
Glucose, Bld: 117 mg/dL — ABNORMAL HIGH (ref 65–99)
Potassium: 4.3 mmol/L (ref 3.5–5.1)
Sodium: 137 mmol/L (ref 135–145)

## 2017-12-05 MED ORDER — KCL IN DEXTROSE-NACL 20-5-0.9 MEQ/L-%-% IV SOLN
INTRAVENOUS | Status: DC
  Administered 2017-12-05 – 2017-12-07 (×5): via INTRAVENOUS
  Filled 2017-12-05 (×6): qty 1000

## 2017-12-05 MED ORDER — GUAIFENESIN-DM 100-10 MG/5ML PO SYRP
5.0000 mL | ORAL_SOLUTION | ORAL | Status: DC | PRN
  Administered 2017-12-05 – 2017-12-10 (×3): 5 mL via ORAL
  Filled 2017-12-05 (×4): qty 5

## 2017-12-05 MED ORDER — HEPARIN SODIUM (PORCINE) 5000 UNIT/ML IJ SOLN
5000.0000 [IU] | Freq: Three times a day (TID) | INTRAMUSCULAR | Status: DC
  Administered 2017-12-05 – 2017-12-13 (×21): 5000 [IU] via SUBCUTANEOUS
  Filled 2017-12-05 (×22): qty 1

## 2017-12-05 MED ORDER — FLEET ENEMA 7-19 GM/118ML RE ENEM
1.0000 | ENEMA | Freq: Once | RECTAL | Status: AC
Start: 2017-12-06 — End: 2017-12-06
  Administered 2017-12-06: 06:00:00 1 via RECTAL

## 2017-12-05 NOTE — Progress Notes (Signed)
CC:RLQ [pain Subjective: This patient admitted to the hospital with right lower quadrant pain and considerable signs of hyper dynamics and sepsis.  A workup suggested secondary appendicitis secondary to a cecal mass.  Currently the patient feels better she has no nausea vomiting and is passing gas.  He has minimal pain which is improved.  Denies fevers or chills.  Objective: Vital signs in last 24 hours: Temp:  [98.7 F (37.1 C)-99.9 F (37.7 C)] 98.8 F (37.1 C) (01/14 0349) Pulse Rate:  [89-92] 92 (01/14 0349) Resp:  [14-16] 16 (01/14 0349) BP: (113-120)/(55-70) 119/55 (01/14 0349) SpO2:  [95 %-99 %] 96 % (01/14 0349) Weight:  [209 lb (94.8 kg)] 209 lb (94.8 kg) (01/14 0400) Last BM Date: 12/02/17  Intake/Output from previous day: 01/13 0701 - 01/14 0700 In: 601.7 [I.V.:451.7; IV Piggyback:150] Out: 400 [Urine:400] Intake/Output this shift: No intake/output data recorded.  Physical exam:  Vital signs reviewed. Patient in no acute distress mildly morbidly obese. Abdomen is soft minimally tender in the right lower quadrant with no percussion or rebound tenderness and no guarding.  Show moderate edema no tenderness no icterus no jaundice  Lab Results: CBC  Recent Labs    12/04/17 0539 12/05/17 0445  WBC 12.7* 10.8  HGB 12.1 12.1  HCT 36.6 36.3  PLT 179 185   BMET Recent Labs    12/04/17 0539 12/05/17 0445  NA 134* 137  K 3.8 4.3  CL 106 109  CO2 21* 21*  GLUCOSE 118* 117*  BUN 14 12  CREATININE 0.74 0.70  CALCIUM 7.9* 8.1*   PT/INR No results for input(s): LABPROT, INR in the last 72 hours. ABG No results for input(s): PHART, HCO3 in the last 72 hours.  Invalid input(s): PCO2, PO2  Studies/Results: Ct Chest Wo Contrast  Result Date: 12/03/2017 CLINICAL DATA:  Unresolved pneumonia EXAM: CT CHEST WITHOUT CONTRAST TECHNIQUE: Multidetector CT imaging of the chest was performed following the standard protocol without IV contrast. COMPARISON:  CT abdomen  pelvis 12/03/2016 FINDINGS: Cardiovascular: Limited without intravenous contrast. Mild atherosclerotic calcification. No aneurysmal dilatation. Normal heart size. Small pericardial fluid Mediastinum/Nodes: Midline trachea. Coarse calcification left lobe of thyroid. Esophagus contains high density material within the distal lumen. Mild thickening at the GE junction. Subcentimeter mediastinal lymph nodes. Lungs/Pleura: 2 mm right upper lobe pulmonary nodule, series 3, image number 32. Trace pleural effusions. Linear atelectasis within the lower lobes. No consolidation. Upper Abdomen: High density material in the gallbladder may reflect vicarious contrast excretion versus small stones. Partially visible cysts in the left kidney. Mild excreted contrast in the left renal collecting system. Musculoskeletal: No acute or suspicious bone lesion. IMPRESSION: 1. Trace pleural effusions with linear atelectasis at both lower lobes. No consolidative infiltrate is seen. 2. Tiny 2 mm right upper lobe pulmonary nodule. Could consider 3 to six-month CT chest follow-up given findings on recent abdominal CT. 3. High density material within the distal lumen of the esophagus could be due to contrast or radiopaque ingested material. 4. Mild contrast or small stones in the gallbladder. Aortic Atherosclerosis (ICD10-I70.0). Electronically Signed   By: Donavan Foil M.D.   On: 12/03/2017 18:13    Anti-infectives: Anti-infectives (From admission, onward)   Start     Dose/Rate Route Frequency Ordered Stop   12/04/17 1000  cefTRIAXone (ROCEPHIN) 2 g in dextrose 5 % 50 mL IVPB     2 g 100 mL/hr over 30 Minutes Intravenous Every 24 hours 12/03/17 0704     12/03/17 0900  metroNIDAZOLE (  FLAGYL) IVPB 500 mg     500 mg 100 mL/hr over 60 Minutes Intravenous Every 8 hours 12/03/17 0847     12/03/17 0700  cefTRIAXone (ROCEPHIN) 2 g in dextrose 5 % 50 mL IVPB     2 g 100 mL/hr over 30 Minutes Intravenous  Once 12/03/17 0650 12/03/17 0752    12/03/17 0700  azithromycin (ZITHROMAX) 500 mg in dextrose 5 % 250 mL IVPB     500 mg 250 mL/hr over 60 Minutes Intravenous  Once 12/03/17 0650 12/03/17 0836      Assessment/Plan:  Cecal mass and secondary appendicitis.  Patient currently on IV antibiotics with considerable improvement.  No signs of sepsis at this time.  Timing of an operation in the form of a right colon resection to be discussed.  I discussed with the patient the possibility of providing surgical intervention in an urgent situation without a biopsy, or providing surgical resection following an endoscopic biopsy which I believe has been planned.  Having more information especially pathology would help [place] determine the proper operation in this patient.  At this point I would defer to gastroenterology to make a decision about endoscopy.  She is not in a situation dictating emergent surgery at this point but I would continue her IV antibiotics and proceed with colonoscopy as soon as possible.  Florene Glen, MD, FACS  12/05/2017

## 2017-12-05 NOTE — Progress Notes (Signed)
Appreciate GI consultation and agree with their assessment of not doing a colonoscopy at this time.  Colon resection and ultimate biopsy timing based on the patient's condition suggest that she could have the surgery in the next day or 2.  I discussed with the patient scheduling for tomorrow afternoon.  She does not need a full bowel prep.  I discussed with the patient the rationale for offering the right colon resection and the options of observation or timing at a different time.  I also discussed the risks of bleeding infection recurrent infection and abscess and anastomotic leak.  Also the potential for an ostomy was discussed which would likely be temporary.  Questions were answered for her she understood and agreed to proceed with this plan for surgery tomorrow.  For BTE prophylaxis, fleets enema and she is on antibiotics.

## 2017-12-05 NOTE — Progress Notes (Signed)
SUBJECTIVE: No chest pain or shortness of breath.    Vitals:   12/04/17 1242 12/04/17 1914 12/05/17 0349 12/05/17 0400  BP: (!) 120/58 113/70 (!) 119/55   Pulse: 89 90 92   Resp: 16 14 16    Temp: 99.4 F (37.4 C) 98.7 F (37.1 C) 98.8 F (37.1 C)   TempSrc: Oral Oral Oral   SpO2: 99% 95% 96%   Weight:    209 lb (94.8 kg)  Height:        Intake/Output Summary (Last 24 hours) at 12/05/2017 1036 Last data filed at 12/05/2017 0936 Gross per 24 hour  Intake 601.67 ml  Output 400 ml  Net 201.67 ml    LABS: Basic Metabolic Panel: Recent Labs    12/04/17 0539 12/05/17 0445  NA 134* 137  K 3.8 4.3  CL 106 109  CO2 21* 21*  GLUCOSE 118* 117*  BUN 14 12  CREATININE 0.74 0.70  CALCIUM 7.9* 8.1*   Liver Function Tests: Recent Labs    12/03/17 0436  AST 55*  ALT 23  ALKPHOS 47  BILITOT 1.4*  PROT 6.2*  ALBUMIN 3.0*   No results for input(s): LIPASE, AMYLASE in the last 72 hours. CBC: Recent Labs    12/04/17 0539 12/05/17 0445  WBC 12.7* 10.8  NEUTROABS 11.0* 8.8*  HGB 12.1 12.1  HCT 36.6 36.3  MCV 88.4 88.8  PLT 179 185   Cardiac Enzymes: Recent Labs    12/03/17 0848 12/03/17 1345 12/03/17 2026  TROPONINI 0.21* 0.21* 0.16*   BNP: Invalid input(s): POCBNP D-Dimer: No results for input(s): DDIMER in the last 72 hours. Hemoglobin A1C: No results for input(s): HGBA1C in the last 72 hours. Fasting Lipid Panel: No results for input(s): CHOL, HDL, LDLCALC, TRIG, CHOLHDL, LDLDIRECT in the last 72 hours. Thyroid Function Tests: Recent Labs    12/03/17 0848  TSH 0.913   Anemia Panel: No results for input(s): VITAMINB12, FOLATE, FERRITIN, TIBC, IRON, RETICCTPCT in the last 72 hours.   PHYSICAL EXAM General: Well developed, well nourished, in no acute distress HEENT:  Normocephalic and atramatic Neck:  No JVD.  Lungs: Clear bilaterally to auscultation and percussion. Heart: HRRR . Normal S1 and S2 without gallops or murmurs.  Abdomen: Bowel sounds  are positive, abdomen soft and non-tender  Msk:  Back normal, normal gait. Normal strength and tone for age. Extremities: 2+ pitting edema  Neuro: Alert and oriented X 3. Psych:  Good affect, responds appropriately  TELEMETRY: NSR 88bpm  ASSESSMENT AND PLAN: History of mild troponin elevation with abdominal mass, and LE edema. Echo shows normal LVEF with normal wall motion and mild diastolic dysfunction. Advise outpatient cardiac testing.   Active Problems:   Sepsis ([place])   Colonic mass   Lactic acidosis    Jake Bathe, NP-C 12/05/2017 10:36 AM Cell: [phone]

## 2017-12-05 NOTE — Care Management Important Message (Signed)
Important Message  Patient Details  Name: Latoya Jones MRN: [id number] Date of Birth: Jun 30, 1946   Medicare Important Message Given:  Yes    Shelbie Ammons, RN 12/05/2017, 8:20 AM

## 2017-12-05 NOTE — Progress Notes (Signed)
Vonda Antigua, MD [address], [place], [place], [place], [address] [address], [place], [place], [place], [address] Phone: [phone]  Fax: [phone]   Subjective: Patient tolerating clear liquid diet this morning.  Reports intermittent right lower quadrant cramping abdominal pain.  Denies nausea vomiting.   Objective: Vital signs in last 24 hours: Vitals:   12/04/17 1242 12/04/17 1914 12/05/17 0349 12/05/17 0400  BP: (!) 120/58 113/70 (!) 119/55   Pulse: 89 90 92   Resp: 16 14 16    Temp: 99.4 F (37.4 C) 98.7 F (37.1 C) 98.8 F (37.1 C)   TempSrc: Oral Oral Oral   SpO2: 99% 95% 96%   Weight:    209 lb (94.8 kg)  Height:       Weight change: 2 lb 4.8 oz (1.043 kg)  Intake/Output Summary (Last 24 hours) at 12/05/2017 1212 Last data filed at 12/05/2017 5284 Gross per 24 hour  Intake 601.67 ml  Output 400 ml  Net 201.67 ml     Exam: Cardiac: +S1, +S2, RRR, No edema Pulm: CTA b/l, Normal Resp Effort Abd: Soft, tender to mild palpation right lower quadrant, No HSM Skin: Warm, no rashes Neck: Supple, Trachea midline   Lab Results: Reviewed Micro Results: No results found for this or any previous visit (from the past 240 hour(s)). Studies/Results: Ct Chest Wo Contrast  Result Date: 12/03/2017 CLINICAL DATA:  Unresolved pneumonia EXAM: CT CHEST WITHOUT CONTRAST TECHNIQUE: Multidetector CT imaging of the chest was performed following the standard protocol without IV contrast. COMPARISON:  CT abdomen pelvis 12/03/2016 FINDINGS: Cardiovascular: Limited without intravenous contrast. Mild atherosclerotic calcification. No aneurysmal dilatation. Normal heart size. Small pericardial fluid Mediastinum/Nodes: Midline trachea. Coarse calcification left lobe of thyroid. Esophagus contains high density material within the distal lumen. Mild thickening at the GE junction. Subcentimeter mediastinal lymph nodes. Lungs/Pleura: 2 mm right upper lobe pulmonary nodule,  series 3, image number 32. Trace pleural effusions. Linear atelectasis within the lower lobes. No consolidation. Upper Abdomen: High density material in the gallbladder may reflect vicarious contrast excretion versus small stones. Partially visible cysts in the left kidney. Mild excreted contrast in the left renal collecting system. Musculoskeletal: No acute or suspicious bone lesion. IMPRESSION: 1. Trace pleural effusions with linear atelectasis at both lower lobes. No consolidative infiltrate is seen. 2. Tiny 2 mm right upper lobe pulmonary nodule. Could consider 3 to six-month CT chest follow-up given findings on recent abdominal CT. 3. High density material within the distal lumen of the esophagus could be due to contrast or radiopaque ingested material. 4. Mild contrast or small stones in the gallbladder. Aortic Atherosclerosis (ICD10-I70.0). Electronically Signed   By: Donavan Foil M.D.   On: 12/03/2017 18:13   Medications:  Scheduled Meds: . ARIPiprazole  5 mg Oral QHS  . benztropine  0.5 mg Oral QHS  . calcium-vitamin D  1 tablet Oral Daily  . divalproex  500 mg Oral QHS  . docusate sodium  100 mg Oral BID  . memantine  14 mg Oral QHS  . perphenazine  4 mg Oral BID  . pneumococcal 23 valent vaccine  0.5 mL Intramuscular Tomorrow-1000   Continuous Infusions: . cefTRIAXone (ROCEPHIN) IVPB 2 gram/50 mL D5W (Pyxis) 2 g (12/05/17 1129)  . dextrose 5 % and 0.9% NaCl 1,000 mL with potassium chloride 20 mEq infusion 100 mL/hr at 12/05/17 0936  . metronidazole Stopped (12/05/17 1039)  . 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 Vicente Males over the weekend, and due to active appendicitis, colonoscopy is contraindicated (please see consult note) Dr. Adonis Huguenin from surgery also evaluated the patient and recommended right hemicolectomy during the hospital stay Patient continues to have signs of appendicitis with right lower quadrant pain to mild palpation.  Colonoscopy remains contraindicated in this setting due to high risk of perforation with the procedure.  Surgical resection would allow for tissue diagnosis of the mass itself, and treatment of appendicitis as well. Would defer to surgery regarding timing of surgery.    LOS: 2 days   Vonda Antigua, MD 12/05/2017, 12:12 PM

## 2017-12-05 NOTE — Progress Notes (Signed)
[place] [place] NAME: Latoya Jones    MR#:  [id number]  DATE OF BIRTH:  [birth year]  SUBJECTIVE:  CHIEF COMPLAINT:   Chief Complaint  Patient presents with  . Dizziness   Continues to complain of right lower quadrant abdominal pain REVIEW OF SYSTEMS:  CONSTITUTIONAL: No fever, fatigue or weakness.  EYES: No blurred or double vision.  EARS, NOSE, AND THROAT: No tinnitus or ear pain.  RESPIRATORY: No cough, shortness of breath, wheezing or hemoptysis.  CARDIOVASCULAR: No chest pain, orthopnea, edema.  GASTROINTESTINAL: No nausea, vomiting, diarrhea or abdominal pain.  GENITOURINARY: No dysuria, hematuria.  ENDOCRINE: No polyuria, nocturia,  HEMATOLOGY: No anemia, easy bruising or bleeding SKIN: No rash or lesion. MUSCULOSKELETAL: No joint pain or arthritis.   NEUROLOGIC: No tingling, numbness, weakness.  PSYCHIATRY: No anxiety or depression.   ROS  DRUG ALLERGIES:  No Known Allergies  VITALS:  Blood pressure (!) 119/55, pulse 92, temperature 98.8 F (37.1 C), temperature source Oral, resp. rate 16, height 5\' 5"  (1.651 m), weight 94.8 kg (209 lb), SpO2 96 %.  PHYSICAL EXAMINATION:  GENERAL:  72 y.o.-year-old patient lying in the bed with no acute distress.  EYES: Pupils equal, round, reactive to light and accommodation. No scleral icterus. Extraocular muscles intact.  HEENT: Head atraumatic, normocephalic. Oropharynx and nasopharynx clear.  NECK:  Supple, no jugular venous distention. No thyroid enlargement, no tenderness.  LUNGS: Normal breath sounds bilaterally, no wheezing, rales,rhonchi or crepitation. No use of accessory muscles of respiration.  CARDIOVASCULAR: S1, S2 normal. No murmurs, rubs, or gallops.  ABDOMEN: Soft, right lower abdominal tenderness, no rebound/guarding. No organomegaly or mass.  EXTREMITIES: No pedal edema, cyanosis, or clubbing.  NEUROLOGIC: Cranial nerves II through XII are intact. Muscle strength  5/5 in all extremities. Sensation intact. Gait not checked.  PSYCHIATRIC: The patient is alert and oriented x 3.  SKIN: No obvious rash, lesion, or ulcer.   Physical Exam LABORATORY PANEL:   CBC Recent Labs  Lab 12/05/17 0445  WBC 10.8  HGB 12.1  HCT 36.3  PLT 185   ------------------------------------------------------------------------------------------------------------------  Chemistries  Recent Labs  Lab 12/03/17 0436  12/05/17 0445  NA 127*   < > 137  K 3.2*   < > 4.3  CL 98*   < > 109  CO2 15*   < > 21*  GLUCOSE 175*   < > 117*  BUN 11   < > 12  CREATININE 1.35*   < > 0.70  CALCIUM 8.8*   < > 8.1*  AST 55*  --   --   ALT 23  --   --   ALKPHOS 47  --   --   BILITOT 1.4*  --   --    < > = values in this interval not displayed.   ------------------------------------------------------------------------------------------------------------------  Cardiac Enzymes Recent Labs  Lab 12/03/17 1345 12/03/17 2026  TROPONINI 0.21* 0.16*   ------------------------------------------------------------------------------------------------------------------  RADIOLOGY:  Ct Chest Wo Contrast  Result Date: 12/03/2017 CLINICAL DATA:  Unresolved pneumonia EXAM: CT CHEST WITHOUT CONTRAST TECHNIQUE: Multidetector CT imaging of the chest was performed following the standard protocol without IV contrast. COMPARISON:  CT abdomen pelvis 12/03/2016 FINDINGS: Cardiovascular: Limited without intravenous contrast. Mild atherosclerotic calcification. No aneurysmal dilatation. Normal heart size. Small pericardial fluid Mediastinum/Nodes: Midline trachea. Coarse calcification left lobe of thyroid. Esophagus contains high density material within the distal lumen. Mild thickening at the GE junction. Subcentimeter mediastinal lymph nodes. Lungs/Pleura: 2 mm  right upper lobe pulmonary nodule, series 3, image number 32. Trace pleural effusions. Linear atelectasis within the lower lobes. No  consolidation. Upper Abdomen: High density material in the gallbladder may reflect vicarious contrast excretion versus small stones. Partially visible cysts in the left kidney. Mild excreted contrast in the left renal collecting system. Musculoskeletal: No acute or suspicious bone lesion. IMPRESSION: 1. Trace pleural effusions with linear atelectasis at both lower lobes. No consolidative infiltrate is seen. 2. Tiny 2 mm right upper lobe pulmonary nodule. Could consider 3 to six-month CT chest follow-up given findings on recent abdominal CT. 3. High density material within the distal lumen of the esophagus could be due to contrast or radiopaque ingested material. 4. Mild contrast or small stones in the gallbladder. Aortic Atherosclerosis (ICD10-I70.0). Electronically Signed   By: Donavan Foil M.D.   On: 12/03/2017 18:13    ASSESSMENT AND PLAN:  1acute sepsis Resolved 100 Secondary to acute large cecal mass with associated acute appendicitis Continue sepsis protocol, empiric Rocephin/Flagyl for now, general surgery input appreciated-we will require most likely right hemicolectomy while inpatient per surgery, GI/oncology following, and continue close medical monitoring  2acute appendicitis Stable Plan of care as stated above  3acute newly diagnosed cecal mass Plan of care as stated above  4acute kidney injury Resolved with IV fluids for rehydration  5chronic schizophrenia Stable Continue home psychotropic regiment  6chronic morbid obesity Most likely secondary to excess calories Lifestyle modification recommended  7 elevated troponins Most likely secondary to sepsis/demand ischemia Echocardiogram noted for mild diastolic dysfunction cardiology input appreciated  - will follow-up with cardiology s/p discharge for continued outpatient management/evaluation   All the records are reviewed and case discussed with Care Management/Social Workerr. Management plans discussed with the  patient, family and they are in agreement.  CODE STATUS: full  TOTAL TIME TAKING CARE OF THIS PATIENT: 40 minutes.     POSSIBLE D/C IN 5-7 DAYS, DEPENDING ON CLINICAL CONDITION.   Avel Peace Salary M.D on 12/05/2017   Between 7am to 6pm - Pager - [phone]  After 6pm go to [email or web address] - password [email or web address] [facility]  Office  [phone]  CC: Primary care physician; Randel Pigg, MD  Note: This dictation was prepared with Dragon dictation along with smaller phrase technology. Any transcriptional errors that result from this process are unintentional.

## 2017-12-05 NOTE — Care Management Note (Signed)
Case Management Note  Patient Details  Name: Latoya Jones MRN: [id number] Date of Birth: 1946/04/12  Subjective/Objective:                  Admitted to [facility] with the diagnosis of sepsis. A resident of [facility] since 12/21/2013. Sister is [name] [phone]). Dr. Duwayne Heck is listed as primary care physician.  Adominal pain, Surgical Consult: Cecal mass which may be secondary to appendix. May need hemicolectomy.    Action/Plan: Received referral for home health needs. Will continue to follow   Expected Discharge Date:                  Expected Discharge Plan:     In-House Referral:   yes  Discharge planning Services    yes Post Acute Care Choice:    Choice offered to:     DME Arranged:    DME Agency:     HH Arranged:    HH Agency:     Status of Service:     If discussed at [employer] of Stay Meetings, dates discussed:    Additional Comments:  Shelbie Ammons, RN MSN CCM Care Management [phone] 12/05/2017, 9:21 AM

## 2017-12-06 ENCOUNTER — Encounter
Admission: EM | Disposition: A | Discharge status: Home / Self Care | Payer: Self-pay | Source: Home / Self Care | Attending: Family Medicine

## 2017-12-06 ENCOUNTER — Inpatient Hospital Stay: Payer: Medicare Other | Admitting: Registered Nurse

## 2017-12-06 ENCOUNTER — Encounter: Payer: Self-pay | Admitting: *Deleted

## 2017-12-06 HISTORY — PX: COLOSTOMY REVISION: SHX5232

## 2017-12-06 LAB — BASIC METABOLIC PANEL
Anion gap: 6 (ref 5–15)
BUN: 10 mg/dL (ref 6–20)
CALCIUM: 7.9 mg/dL — AB (ref 8.9–10.3)
CO2: 22 mmol/L (ref 22–32)
Chloride: 107 mmol/L (ref 101–111)
Creatinine, Ser: 0.59 mg/dL (ref 0.44–1.00)
GFR calc Af Amer: >60 mL/min (ref >60)
GLUCOSE: 135 mg/dL — AB (ref 65–99)
Potassium: 4.2 mmol/L (ref 3.5–5.1)
Sodium: 135 mmol/L (ref 135–145)

## 2017-12-06 LAB — CBC WITH DIFFERENTIAL/PLATELET
BASOS ABS: 0 10*3/uL (ref 0–0.1)
BASOS PCT: 0 %
Eosinophils Absolute: 0.2 10*3/uL (ref 0–0.7)
Eosinophils Relative: 1 %
HCT: 35.9 % (ref 35.0–47.0)
Hemoglobin: 12 g/dL (ref 12.0–16.0)
LYMPHS PCT: 6 %
Lymphs Abs: 0.7 10*3/uL — ABNORMAL LOW (ref 1.0–3.6)
MCH: 29.8 pg (ref 26.0–34.0)
MCHC: 33.4 g/dL (ref 32.0–36.0)
MCV: 89.1 fL (ref 80.0–100.0)
MONO ABS: 1.4 10*3/uL — AB (ref 0.2–0.9)
Monocytes Relative: 12 %
Neutro Abs: 9.2 10*3/uL — ABNORMAL HIGH (ref 1.4–6.5)
Neutrophils Relative %: 81 %
PLATELETS: 213 10*3/uL (ref 150–440)
RBC: 4.03 MIL/uL (ref 3.80–5.20)
RDW: 13.7 % (ref 11.5–14.5)
WBC: 11.4 10*3/uL — ABNORMAL HIGH (ref 3.6–11.0)

## 2017-12-06 LAB — CEA: CEA1: 4.8 ng/mL — AB (ref 0.0–4.7)

## 2017-12-06 SURGERY — COLECTOMY, RIGHT
Anesthesia: General | Site: Abdomen | Wound class: Clean Contaminated

## 2017-12-06 MED ORDER — ONDANSETRON HCL 4 MG/2ML IJ SOLN
INTRAMUSCULAR | Status: AC
  Filled 2017-12-06: qty 2

## 2017-12-06 MED ORDER — LACTATED RINGERS IV SOLN
INTRAVENOUS | Status: DC | PRN
  Administered 2017-12-06: 16:00:00 via INTRAVENOUS

## 2017-12-06 MED ORDER — BUPIVACAINE-EPINEPHRINE (PF) 0.25% -1:200000 IJ SOLN
INTRAMUSCULAR | Status: AC
  Filled 2017-12-06: qty 30

## 2017-12-06 MED ORDER — ROCURONIUM BROMIDE 50 MG/5ML IV SOLN
INTRAVENOUS | Status: AC
  Filled 2017-12-06: qty 1

## 2017-12-06 MED ORDER — FENTANYL CITRATE (PF) 100 MCG/2ML IJ SOLN
INTRAMUSCULAR | Status: AC
  Filled 2017-12-06: qty 2

## 2017-12-06 MED ORDER — LIDOCAINE HCL (CARDIAC) 20 MG/ML IV SOLN
INTRAVENOUS | Status: DC | PRN
  Administered 2017-12-06: 100 mg via INTRAVENOUS

## 2017-12-06 MED ORDER — LACTATED RINGERS IV SOLN
INTRAVENOUS | Status: DC | PRN
  Administered 2017-12-06 (×2): via INTRAVENOUS

## 2017-12-06 MED ORDER — DEXAMETHASONE SODIUM PHOSPHATE 10 MG/ML IJ SOLN
INTRAMUSCULAR | Status: AC
  Filled 2017-12-06: qty 1

## 2017-12-06 MED ORDER — DEXAMETHASONE SODIUM PHOSPHATE 10 MG/ML IJ SOLN
INTRAMUSCULAR | Status: DC | PRN
  Administered 2017-12-06: 10 mg via INTRAVENOUS

## 2017-12-06 MED ORDER — SUGAMMADEX SODIUM 200 MG/2ML IV SOLN
INTRAVENOUS | Status: DC | PRN
  Administered 2017-12-06: 200 mg via INTRAVENOUS

## 2017-12-06 MED ORDER — ACETAMINOPHEN 10 MG/ML IV SOLN
INTRAVENOUS | Status: AC
  Filled 2017-12-06: qty 100

## 2017-12-06 MED ORDER — ACETAMINOPHEN 10 MG/ML IV SOLN
INTRAVENOUS | Status: DC | PRN
  Administered 2017-12-06: 1000 mg via INTRAVENOUS

## 2017-12-06 MED ORDER — SUGAMMADEX SODIUM 200 MG/2ML IV SOLN
INTRAVENOUS | Status: AC
  Filled 2017-12-06: qty 2

## 2017-12-06 MED ORDER — ONDANSETRON HCL 4 MG/2ML IJ SOLN: 4.0000 mg | Freq: Once | INTRAMUSCULAR | Status: DC | PRN

## 2017-12-06 MED ORDER — MORPHINE SULFATE (PF) 2 MG/ML IV SOLN
2.0000 mg | INTRAVENOUS | Status: DC | PRN
  Administered 2017-12-07 – 2017-12-12 (×5): 2 mg via INTRAVENOUS
  Filled 2017-12-06 (×6): qty 1

## 2017-12-06 MED ORDER — FENTANYL CITRATE (PF) 100 MCG/2ML IJ SOLN
INTRAMUSCULAR | Status: DC | PRN
  Administered 2017-12-06 (×3): 50 ug via INTRAVENOUS

## 2017-12-06 MED ORDER — MIDAZOLAM HCL 2 MG/2ML IJ SOLN
INTRAMUSCULAR | Status: DC | PRN
  Administered 2017-12-06: 2 mg via INTRAVENOUS

## 2017-12-06 MED ORDER — PROPOFOL 10 MG/ML IV BOLUS
INTRAVENOUS | Status: AC
  Filled 2017-12-06: qty 20

## 2017-12-06 MED ORDER — ROCURONIUM BROMIDE 100 MG/10ML IV SOLN
INTRAVENOUS | Status: DC | PRN
  Administered 2017-12-06: 30 mg via INTRAVENOUS
  Administered 2017-12-06: 50 mg via INTRAVENOUS

## 2017-12-06 MED ORDER — ONDANSETRON HCL 4 MG/2ML IJ SOLN
INTRAMUSCULAR | Status: DC | PRN
  Administered 2017-12-06: 4 mg via INTRAVENOUS

## 2017-12-06 MED ORDER — MIDAZOLAM HCL 2 MG/2ML IJ SOLN
INTRAMUSCULAR | Status: AC
  Filled 2017-12-06: qty 2

## 2017-12-06 MED ORDER — BUPIVACAINE-EPINEPHRINE 0.25% -1:200000 IJ SOLN
INTRAMUSCULAR | Status: DC | PRN
  Administered 2017-12-06: 30 mL

## 2017-12-06 MED ORDER — PROPOFOL 10 MG/ML IV BOLUS
INTRAVENOUS | Status: DC | PRN
  Administered 2017-12-06: 170 mg via INTRAVENOUS

## 2017-12-06 MED ORDER — PHENYLEPHRINE HCL 10 MG/ML IJ SOLN
INTRAMUSCULAR | Status: DC | PRN
  Administered 2017-12-06: 200 ug via INTRAVENOUS

## 2017-12-06 MED ORDER — FENTANYL CITRATE (PF) 100 MCG/2ML IJ SOLN: 25.0000 ug | INTRAMUSCULAR | Status: DC | PRN

## 2017-12-06 MED ORDER — SEVOFLURANE IN SOLN
RESPIRATORY_TRACT | Status: AC
  Filled 2017-12-06: qty 250

## 2017-12-06 MED ORDER — LIDOCAINE HCL (PF) 2 % IJ SOLN
INTRAMUSCULAR | Status: AC
  Filled 2017-12-06: qty 10

## 2017-12-06 SURGICAL SUPPLY — 56 items
ADHESIVE MASTISOL STRL (MISCELLANEOUS) IMPLANT
CANISTER SUCT 1200ML W/VALVE (MISCELLANEOUS) ×3 IMPLANT
CHLORAPREP W/TINT 26ML (MISCELLANEOUS) ×3 IMPLANT
CLOSURE WOUND 1/2 X4 (GAUZE/BANDAGES/DRESSINGS)
COVER CLAMP SIL LG PBX B (MISCELLANEOUS) IMPLANT
DRAPE LAPAROTOMY 100X77 ABD (DRAPES) ×3 IMPLANT
DRAPE LEGGINS SURG 28X43 STRL (DRAPES) IMPLANT
DRSG OPSITE POSTOP 4X10 (GAUZE/BANDAGES/DRESSINGS) IMPLANT
DRSG OPSITE POSTOP 4X8 (GAUZE/BANDAGES/DRESSINGS) IMPLANT
DRSG TELFA 3X8 NADH (GAUZE/BANDAGES/DRESSINGS) ×3 IMPLANT
ELECT BLADE 6.5 EXT (BLADE) ×3 IMPLANT
ELECT CAUTERY BLADE 6.4 (BLADE) ×3 IMPLANT
ELECT REM PT RETURN 9FT ADLT (ELECTROSURGICAL) ×3
ELECTRODE REM PT RTRN 9FT ADLT (ELECTROSURGICAL) ×1 IMPLANT
GAUZE SPONGE 4X4 12PLY STRL (GAUZE/BANDAGES/DRESSINGS) IMPLANT
GLOVE BIO SURGEON STRL SZ8 (GLOVE) ×12 IMPLANT
GLOVE INDICATOR 8.0 STRL GRN (GLOVE) ×12 IMPLANT
GOWN STRL REUS W/ TWL LRG LVL3 (GOWN DISPOSABLE) ×4 IMPLANT
GOWN STRL REUS W/ TWL XL LVL3 (GOWN DISPOSABLE) ×2 IMPLANT
GOWN STRL REUS W/TWL LRG LVL3 (GOWN DISPOSABLE) ×8
GOWN STRL REUS W/TWL XL LVL3 (GOWN DISPOSABLE) ×4
JACKSON PRATT 10 (INSTRUMENTS) ×3 IMPLANT
KIT RM TURNOVER STRD PROC AR (KITS) ×3 IMPLANT
LABEL OR SOLS (LABEL) ×3 IMPLANT
NEEDLE HYPO 22GX1.5 SAFETY (NEEDLE) ×3 IMPLANT
NS IRRIG 1000ML POUR BTL (IV SOLUTION) ×3 IMPLANT
PACK BASIN MAJOR ARMC (MISCELLANEOUS) ×3 IMPLANT
PACK COLON CLEAN CLOSURE (MISCELLANEOUS) ×3 IMPLANT
RELOAD LINEAR CUT PROX 55 BLUE (ENDOMECHANICALS) ×6 IMPLANT
RELOAD PROXIMATE 30MM BLUE (ENDOMECHANICALS) IMPLANT
RELOAD PROXIMATE 75MM BLUE (ENDOMECHANICALS) ×6 IMPLANT
RELOAD PROXIMATE TA60MM BLUE (ENDOMECHANICALS) ×3 IMPLANT
RELOAD STAPLER LINEAR PROX 30 (STAPLE) IMPLANT
SEPRAFILM MEMBRANE 5X6 (MISCELLANEOUS) ×3 IMPLANT
SET YANKAUER POOLE SUCT (MISCELLANEOUS) ×3 IMPLANT
SPONGE LAP 18X18 5 PK (GAUZE/BANDAGES/DRESSINGS) ×12 IMPLANT
STAPLER GUN LINEAR PROX 60 (STAPLE) ×3 IMPLANT
STAPLER PROXIMATE 55 BLUE (STAPLE) ×3 IMPLANT
STAPLER PROXIMATE 75MM BLUE (STAPLE) ×3 IMPLANT
STAPLER RELOAD LINEAR PROX 30 (STAPLE)
STAPLER SKIN PROX 35W (STAPLE) ×3 IMPLANT
STRIP CLOSURE SKIN 1/2X4 (GAUZE/BANDAGES/DRESSINGS) IMPLANT
SUT ETHILON 3-0 FS-10 30 BLK (SUTURE) ×3
SUT MNCRL 3-0 UNDYED SH (SUTURE) ×2 IMPLANT
SUT MONOCRYL 3-0 UNDYED (SUTURE) ×4
SUT PDS AB 1 CT1 27 (SUTURE) ×3 IMPLANT
SUT PDS AB 1 TP1 54 (SUTURE) ×6 IMPLANT
SUT SILK 0 (SUTURE) ×4
SUT SILK 0 30XBRD TIE 6 (SUTURE) ×2 IMPLANT
SUT SILK 0 SH 30 (SUTURE) ×3 IMPLANT
SUT SILK 3-0 (SUTURE) ×9 IMPLANT
SUT VIC AB 1 CTX 27 (SUTURE) ×9 IMPLANT
SUT VICRYL 0 TIES 12 18 (SUTURE) ×6 IMPLANT
SUTURE EHLN 3-0 FS-10 30 BLK (SUTURE) ×1 IMPLANT
SYR 10ML LL (SYRINGE) ×3 IMPLANT
TRAY FOLEY W/METER SILVER 16FR (SET/KITS/TRAYS/PACK) ×3 IMPLANT

## 2017-12-06 NOTE — Anesthesia Procedure Notes (Signed)
Procedure Name: Intubation Date/Time: 12/06/2017 3:12 PM Performed by: Silvana Newness, CRNA Pre-anesthesia Checklist: Patient identified, Emergency Drugs available, Suction available, Patient being monitored and Timeout performed Patient Re-evaluated:Patient Re-evaluated prior to induction Oxygen Delivery Method: Circle system utilized Preoxygenation: Pre-oxygenation with 100% oxygen Induction Type: IV induction Ventilation: Mask ventilation without difficulty Laryngoscope Size: Mac and 3 Grade View: Grade I Tube type: Oral Tube size: 7.0 mm Number of attempts: 1 Airway Equipment and Method: Stylet Placement Confirmation: ETT inserted through vocal cords under direct vision,  positive ETCO2 and breath sounds checked- equal and bilateral Secured at: 19 cm Tube secured with: Tape Dental Injury: Teeth and Oropharynx as per pre-operative assessment

## 2017-12-06 NOTE — Progress Notes (Signed)
[place] [facility] [place] NAME: Latoya Jones    MR#:  [id number]  DATE OF BIRTH:  1946-11-05  SUBJECTIVE:  CHIEF COMPLAINT:   Chief Complaint  Patient presents with  . Dizziness  Patient complains of abdominal pain only, the patient's sister is at the bedside, for operative intervention later today  REVIEW OF SYSTEMS:  CONSTITUTIONAL: No fever, fatigue or weakness.  EYES: No blurred or double vision.  EARS, NOSE, AND THROAT: No tinnitus or ear pain.  RESPIRATORY: No cough, shortness of breath, wheezing or hemoptysis.  CARDIOVASCULAR: No chest pain, orthopnea, edema.  GASTROINTESTINAL: No nausea, vomiting, diarrhea or abdominal pain.  GENITOURINARY: No dysuria, hematuria.  ENDOCRINE: No polyuria, nocturia,  HEMATOLOGY: No anemia, easy bruising or bleeding SKIN: No rash or lesion. MUSCULOSKELETAL: No joint pain or arthritis.   NEUROLOGIC: No tingling, numbness, weakness.  PSYCHIATRY: No anxiety or depression.   ROS  DRUG ALLERGIES:  No Known Allergies  VITALS:  Blood pressure 140/87, pulse 98, temperature 99.1 F (37.3 C), temperature source Oral, resp. rate 17, height 5\' 5"  (1.651 m), weight 99.8 kg (220 lb 1.6 oz), SpO2 97 %.  PHYSICAL EXAMINATION:  GENERAL:  72 y.o.-year-old patient lying in the bed with no acute distress.  EYES: Pupils equal, round, reactive to light and accommodation. No scleral icterus. Extraocular muscles intact.  HEENT: Head atraumatic, normocephalic. Oropharynx and nasopharynx clear.  NECK:  Supple, no jugular venous distention. No thyroid enlargement, no tenderness.  LUNGS: Normal breath sounds bilaterally, no wheezing, rales,rhonchi or crepitation. No use of accessory muscles of respiration.  CARDIOVASCULAR: S1, S2 normal. No murmurs, rubs, or gallops.  ABDOMEN: Soft, nontender, nondistended. Bowel sounds present. No organomegaly or mass.  EXTREMITIES: No pedal edema, cyanosis, or clubbing.  NEUROLOGIC:  Cranial nerves II through XII are intact. Muscle strength 5/5 in all extremities. Sensation intact. Gait not checked.  PSYCHIATRIC: The patient is alert and oriented x 3.  SKIN: No obvious rash, lesion, or ulcer.   Physical Exam LABORATORY PANEL:   CBC Recent Labs  Lab 12/06/17 0411  WBC 11.4*  HGB 12.0  HCT 35.9  PLT 213   ------------------------------------------------------------------------------------------------------------------  Chemistries  Recent Labs  Lab 12/03/17 0436  12/06/17 0411  NA 127*   < > 135  K 3.2*   < > 4.2  CL 98*   < > 107  CO2 15*   < > 22  GLUCOSE 175*   < > 135*  BUN 11   < > 10  CREATININE 1.35*   < > 0.59  CALCIUM 8.8*   < > 7.9*  AST 55*  --   --   ALT 23  --   --   ALKPHOS 47  --   --   BILITOT 1.4*  --   --    < > = values in this interval not displayed.   ------------------------------------------------------------------------------------------------------------------  Cardiac Enzymes Recent Labs  Lab 12/03/17 1345 12/03/17 2026  TROPONINI 0.21* 0.16*   ------------------------------------------------------------------------------------------------------------------  RADIOLOGY:  No results found.  ASSESSMENT AND PLAN:  1acute sepsis Resolved Secondary to acute large cecal mass with associatedacuteappendicitis Treated with our sepsis protocol,empiric Rocephin/Flagyl,for right hemicolectomy later today by general surgery, GI/oncologyfollowing  2acute appendicitis Stable Full right hemicolectomy later today given cecal mass with associated appendicitis   3acute newly diagnosed cecal mass Plan of care as stated above  4acute kidney injury Resolved with IV fluids for rehydration  5chronic schizophrenia Stable Continue home psychotropic regiment  6chronic morbid obesity Most likely secondary to excess calories Lifestyle modification recommended  7elevated troponins Most likely secondary to  sepsis/demand ischemia Echocardiogram noted for mild diastolic dysfunction cardiology input appreciated  - will follow-up with cardiology s/p discharge for continued outpatient management/evaluation   All the records are reviewed and case discussed with Care Management/Social Workerr. Management plans discussed with the patient, family and they are in agreement.  CODE STATUS: full  TOTAL TIME TAKING CARE OF THIS PATIENT: 45 minutes.     POSSIBLE D/C IN 3-7 DAYS, DEPENDING ON CLINICAL CONDITION.   Latoya Jones Latoya Jones M.D on 12/06/2017   Between 7am to 6pm - Pager - [phone]  After 6pm go to [email or web address] - password [email or web address] [facility]  Office  [phone]  CC: Primary care physician; Latoya Pigg, MD  Note: This dictation was prepared with Dragon dictation along with smaller phrase technology. Any transcriptional errors that result from this process are unintentional.

## 2017-12-06 NOTE — Anesthesia Postprocedure Evaluation (Signed)
Anesthesia Post Note  Patient: Latoya Jones  Procedure(s) Performed: COLON RESECTION RIGHT (N/A Abdomen)  Patient location during evaluation: PACU Anesthesia Type: General Level of consciousness: awake and alert Pain management: pain level controlled Vital Signs Assessment: post-procedure vital signs reviewed and stable Respiratory status: spontaneous breathing and respiratory function stable Cardiovascular status: stable Anesthetic complications: no     Last Vitals:  Vitals:   12/06/17 0310 12/06/17 1711  BP:    Pulse:    Resp:    Temp:  (P) 36.4 C  SpO2: 97%     Last Pain:  Vitals:   12/06/17 1711  TempSrc:   PainSc: (P) Asleep                 Feather Berrie K

## 2017-12-06 NOTE — Progress Notes (Signed)
Pharmacy Antibiotic Note  Latoya Jones is a 72 y.o. female admitted on 12/03/2017 with appendicitis/cecal mass.  Pharmacy has been consulted for CEFTRIAXONE dosing. Pt is also on metronidazole  Plan: Continue Ceftriaxone 2 grams q 24 hours.  Height: 5\' 5"  (165.1 cm) Weight: 220 lb 1.6 oz (99.8 kg) IBW/kg (Calculated) : 57  Temp (24hrs), Avg:98.9 F (37.2 C), Min:98.3 F (36.8 C), Max:99.3 F (37.4 C)  Recent Labs  Lab 12/03/17 0436 12/03/17 0728 12/04/17 0539 12/05/17 0445 12/06/17 0411  WBC 20.1*  --  12.7* 10.8 11.4*  CREATININE 1.35*  --  0.74 0.70 0.59  LATICACIDVEN 5.7* 1.9  --   --   --     Estimated Creatinine Clearance: 77.6 mL/min (by C-G formula based on SCr of 0.59 mg/dL).    No Known Allergies  Antimicrobials this admission: Ceftriaxone 1/12  >>    Dose adjustments this admission:   Microbiology results: No micro     1/12 UA: LE(-)  NO2(-)  WBC 0-5 Thank you for allowing pharmacy to be a part of this patient's care.  Ramond Dial, Pharm.D, BCPS Clinical Pharmacist  12/06/2017 9:33 AM

## 2017-12-06 NOTE — Anesthesia Post-op Follow-up Note (Signed)
Anesthesia QCDR form completed.        

## 2017-12-06 NOTE — Anesthesia Preprocedure Evaluation (Addendum)
Anesthesia Evaluation  Patient identified by MRN, date of birth, ID band Patient awake    Reviewed: Allergy & Precautions, H&P , NPO status , Patient's Chart, lab work & pertinent test results, reviewed documented beta blocker date and time   Airway Mallampati: III  TM Distance: >3 FB Neck ROM: full    Dental  (+) Edentulous Upper, Edentulous Lower   Pulmonary neg pulmonary ROS, former smoker,    Pulmonary exam normal        Cardiovascular negative cardio ROS Normal cardiovascular exam Rhythm:regular Rate:Normal     Neuro/Psych negative neurological ROS  negative psych ROS   GI/Hepatic negative GI ROS, Neg liver ROS,   Endo/Other  negative endocrine ROS  Renal/GU negative Renal ROS  negative genitourinary   Musculoskeletal   Abdominal   Peds  Hematology negative hematology ROS (+)   Anesthesia Other Findings History reviewed. No pertinent past medical history. History reviewed. No pertinent surgical history. BMI    Body Mass Index:  36.63 kg/m     Reproductive/Obstetrics negative OB ROS                            Anesthesia Physical Anesthesia Plan  ASA: II  Anesthesia Plan: General ETT   Post-op Pain Management:    Induction:   PONV Risk Score and Plan:   Airway Management Planned:   Additional Equipment:   Intra-op Plan:   Post-operative Plan:   Informed Consent: I have reviewed the patients History and Physical, chart, labs and discussed the procedure including the risks, benefits and alternatives for the proposed anesthesia with the patient or authorized representative who has indicated his/her understanding and acceptance.   Dental Advisory Given  Plan Discussed with: CRNA  Anesthesia Plan Comments:         Anesthesia Quick Evaluation

## 2017-12-06 NOTE — Transfer of Care (Signed)
Immediate Anesthesia Transfer of Care Note  Patient: Latoya Jones  Procedure(s) Performed: COLON RESECTION RIGHT (N/A Abdomen)  Patient Location: PACU  Anesthesia Type:General  Level of Consciousness: drowsy and patient cooperative  Airway & Oxygen Therapy: Patient Spontanous Breathing and Patient connected to face mask oxygen  Post-op Assessment: Report given to RN and Post -op Vital signs reviewed and stable  Post vital signs: Reviewed and stable  Last Vitals:  Vitals:   12/06/17 0305 12/06/17 0310  BP: 140/87   Pulse: 98   Resp: 17   Temp: 37.3 C   SpO2: 92% 97%    Last Pain:  Vitals:   12/06/17 0305  TempSrc: Oral  PainSc:          Complications: No apparent anesthesia complications

## 2017-12-06 NOTE — Progress Notes (Signed)
SUBJECTIVE: Patient does have occasional chest pain   Vitals:   12/05/17 1924 12/06/17 0259 12/06/17 0305 12/06/17 0310  BP: (!) 155/70  140/87   Pulse: 94  98   Resp: 16  17   Temp: 98.3 F (36.8 C)  99.1 F (37.3 C)   TempSrc: Oral  Oral   SpO2: 95% 92% 92% 97%  Weight:   220 lb 1.6 oz (99.8 kg)   Height:   5\' 5"  (1.651 m)     Intake/Output Summary (Last 24 hours) at 12/06/2017 0847 Last data filed at 12/06/2017 0300 Gross per 24 hour  Intake 2006 ml  Output 1400 ml  Net 606 ml    LABS: Basic Metabolic Panel: Recent Labs    12/05/17 0445 12/06/17 0411  NA 137 135  K 4.3 4.2  CL 109 107  CO2 21* 22  GLUCOSE 117* 135*  BUN 12 10  CREATININE 0.70 0.59  CALCIUM 8.1* 7.9*   Liver Function Tests: No results for input(s): AST, ALT, ALKPHOS, BILITOT, PROT, ALBUMIN in the last 72 hours. No results for input(s): LIPASE, AMYLASE in the last 72 hours. CBC: Recent Labs    12/05/17 0445 12/06/17 0411  WBC 10.8 11.4*  NEUTROABS 8.8* 9.2*  HGB 12.1 12.0  HCT 36.3 35.9  MCV 88.8 89.1  PLT 185 213   Cardiac Enzymes: Recent Labs    12/03/17 0848 12/03/17 1345 12/03/17 2026  TROPONINI 0.21* 0.21* 0.16*   BNP: Invalid input(s): POCBNP D-Dimer: No results for input(s): DDIMER in the last 72 hours. Hemoglobin A1C: No results for input(s): HGBA1C in the last 72 hours. Fasting Lipid Panel: No results for input(s): CHOL, HDL, LDLCALC, TRIG, CHOLHDL, LDLDIRECT in the last 72 hours. Thyroid Function Tests: Recent Labs    12/03/17 0848  TSH 0.913   Anemia Panel: No results for input(s): VITAMINB12, FOLATE, FERRITIN, TIBC, IRON, RETICCTPCT in the last 72 hours.   PHYSICAL EXAM General: Well developed, well nourished, in no acute distress HEENT:  Normocephalic and atramatic Neck:  No JVD.  Lungs: Clear bilaterally to auscultation and percussion. Heart: HRRR . Normal S1 and S2 without gallops or murmurs.  Abdomen: Bowel sounds are positive, abdomen soft and  non-tender  Msk:  Back normal, normal gait. Normal strength and tone for age. Extremities: No clubbing, cyanosis or edema.   Neuro: Alert and oriented X 3. Psych:  Good affect, responds appropriately  TELEMETRY: Sinus rhythm  ASSESSMENT AND PLAN: Abdominal pain and possible abdominal surgery with colon resection by Dr. Burt Knack. Patient has occasional atypical chest pain with mildly elevated troponin but normal left reticular systolic function and normal wall motion. Advise proceeding with surgery. May do outpatient workup such as CTA coronaries upon discharge and after colon surgery.  Active Problems:   Sepsis ([place])   Colonic mass   Lactic acidosis    Latoya Jones A, MD, [facility] 12/06/2017 8:47 AM

## 2017-12-06 NOTE — Progress Notes (Signed)
Patient was met in the preop holding area.  Preoperatively we discussed the rationale for offering surgery and the need for a colon resection.  The risks of bleeding infection recurrence anastomosis anastomotic leak transfusion the potential for a temporary ileostomy or permanent ostomy was discussed with her.  The need for additional therapy was discussed as well.  No family was present.  She understood and agreed to proceed.  Questions were answered for her.

## 2017-12-06 NOTE — Plan of Care (Signed)
Desatted to 90% on RA, SpO2 improved to 97% on 1LNC.  VSS otherwise, free of falls during shift.  Denies pain.  Reported nausea, improved w/ PRN IV Zofran 4mg  x1.  Reported cough, improved w/ PRN PO Robitussin DM x1.  No other needs overnight.  Bed in low position, call bell within reach.  WCTM.

## 2017-12-06 NOTE — Progress Notes (Signed)
   Vonda Antigua, MD [address] [place], [place], [place], [address] [address], [place], [place], [place], [address] Phone: [phone]  Fax: [phone]   Subjective: Patient resting in bed comfortably.  States cardiology evaluated her and surgery is planning on surgical resection soon.  Tolerating oral diet.  Reports continued lower abdominal pain.   Objective: Vital signs in last 24 hours: Vitals:   12/05/17 1924 12/06/17 0259 12/06/17 0305 12/06/17 0310  BP: (!) 155/70  140/87   Pulse: 94  98   Resp: 16  17   Temp: 98.3 F (36.8 C)  99.1 F (37.3 C)   TempSrc: Oral  Oral   SpO2: 95% 92% 92% 97%  Weight:   220 lb 1.6 oz (99.8 kg)   Height:   5\' 5"  (1.651 m)    Weight change: 11 lb 1.6 oz (5.035 kg)  Intake/Output Summary (Last 24 hours) at 12/06/2017 1217 Last data filed at 12/06/2017 0300 Gross per 24 hour  Intake 2006 ml  Output 1400 ml  Net 606 ml     Exam: Cardiac: +S1, +S2, RRR, No edema Pulm: CTA b/l, Normal Resp Effort Abd: Soft, tender to palpation right lower quadrant, No HSM Skin: Warm, no rashes Neck: Supple, Trachea midline   Lab Results: Labs reviewed Micro Results: No results found for this or any previous visit (from the past 240 hour(s)). Studies/Results: No results found. Medications:  Scheduled Meds: . ARIPiprazole  5 mg Oral QHS  . benztropine  0.5 mg Oral QHS  . calcium-vitamin D  1 tablet Oral Daily  . divalproex  500 mg Oral QHS  . docusate sodium  100 mg Oral BID  . heparin injection (subcutaneous)  5,000 Units Subcutaneous Q8H  . memantine  14 mg Oral QHS  . perphenazine  4 mg Oral BID  . pneumococcal 23 valent vaccine  0.5 mL Intramuscular Tomorrow-1000   Continuous Infusions: . cefTRIAXone (ROCEPHIN) IVPB 2 gram/50 mL D5W (Pyxis) Stopped (12/06/17 1006)  . dextrose 5 % and 0.9 % NaCl with KCl 20 mEq/L 100 mL/hr at 12/06/17 0821  . metronidazole Stopped (12/06/17 1006)  . sodium chloride     PRN  Meds:.acetaminophen **OR** acetaminophen, calcium carbonate, guaiFENesin-dextromethorphan, morphine injection, nitroGLYCERIN, ondansetron **OR** ondansetron (ZOFRAN) IV   Assessment: Active Problems:   Sepsis ([place])   Colonic mass   Lactic acidosis    Plan: Patient was evaluated by Dr. Burt Knack and the plan is to proceed with surgical resection Patient continues to have signs of appendicitis with right lower quadrant pain to mild palpation Further plan of care as per surgery and primary team Patient can follow-up with GI clinic as an outpatient in 4-6 weeks.  Future colonoscopies to be discussed at that time. GI service will sign off.  Please page with any questions.   LOS: 3 days   Vonda Antigua, MD 12/06/2017, 12:17 PM

## 2017-12-06 NOTE — Op Note (Signed)
12/06/2017 Danetta Borman  Pre-operative Diagnosis: Cecal mass  Post-operative Diagnosis: Cecal mass  Procedure: Exploratory laparotomy, right hemicolectomy with terminal ileum resection, separate small bowel resection  Surgeon: Jerrol Banana. Burt Knack, MD FACS  Anesthesia: Gen. with endotracheal tube  Assistant: PA student  Procedure Details  The patient was seen again in the Holding Room. The benefits, complications, treatment options, and expected outcomes were discussed with the patient. The risks of bleeding, infection, recurrence of symptoms, failure to resolve symptoms,  bowel injury, any of which could require further surgery were reviewed with the patient.   The patient was taken to Operating Room, identified as Latoya Jones and the procedure verified.  A Time Out was held and the above information confirmed.  Prior to the induction of general anesthesia, antibiotic prophylaxis was administered. VTE prophylaxis was in place. General endotracheal anesthesia was then administered and tolerated well. After the induction, the abdomen was prepped with Chloraprep and draped in the sterile fashion. The patient was positioned in the supine position.  A Foley catheter was placed.  Nasogastric tube was placed ultimately by anesthesia as well.  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  Dense inflammatory response of the retroperitoneum and small bowel.  Description of procedure once patient was induced to general anesthesia and a Foley catheter been placed he was prepped and draped in a sterile fashion.  A infraumbilical transverse right-sided incision was made in opened a large amount of ascites fluid was aspirated.  Inspection of the right lower quadrant demonstrated a large mass fixed to the retroperitoneum and lateral peritoneal sidewall this was clearly a cancer and was dissected free of the retroperitoneum.   There was dense inflammatory response in the retroperitoneum and free tumor present in the retroperitoneum.  The ureter could not be easily identified due to the amount of inflammation.  Right hemicolectomy was performed by incising the lateral avascular line and elevating the right colon into the wound.  The transverse colon was divided with a GIA stapler.  The terminal ileum was divided.  Clamps were utilized to divide the mesentery and the specimen was sent off for examination.  0 silk ties or double ties were placed on the remaining mesenteric vessels.  A second portion of small bowel was densely adhered to the retroperitoneum and tumor in the right lower quadrant.  This could not be easily removed without damaging the small bowel therefore GIA staplers were used at either end of the specimen and clamps were utilized to divide the mesentery and tied with 0 silk.  The specimen was sent off and was likely mid ileum.  A small portion of free tumor identified in the retroperitoneum was sent off separately for examination.  The area was irrigated with copious amounts normal saline hemostasis was adequate at this point therefore anastomoses was performed first the  mid ileum anastomosis was performed utilizing a standard GIA stapler and TA stapled technique with closure of the mesenteric rent with 3 oh silks.  The anastomosis was patent and viable.  The second anastomosis of ileum to transverse colon was performed in a similar fashion with GIA and TA and reinforced and closed at the mesentery with 3 oh silks as well.  Again the bowel was run and found to possess no unforeseen problems including injuries etc. this is sponge lap needle count was correct the area was irrigated with copious amounts normal saline and clean closure was performed as follows.  A Foley catheter was brought in through a separate incision in the right lower quadrant and placed into the right abdominal pericolic gutter.  This tied in  with 3-0 nylon.  Ultimately it was attached to bulb suction.  Clean closure was performed after her changing drapes and instruments counts etc..  Marcaine was infiltrated into the skin is obtains tissues for a total of 30 cc and then a piece of Seprafilm was placed in running #1 PDS was utilized to close the wound.  Skin staples were placed.  Sterile dressing was placed.  Patient taught this procedure well there were no complications she was taken to recovery room in stable condition to be admitted for continued care leaving her and knows nasogastric tube and Foley catheter in place.  Sponge lap needle count was correct on the final count as well.   Estimated Blood Loss: 4-500 cc         Drains: JP drain         Specimens: Right colon, small bowel, retroperitoneal free tumor.       Complications: None              Condition: Stable   Adira Limburg E. Burt Knack, MD, FACS

## 2017-12-07 ENCOUNTER — Other Ambulatory Visit: Payer: Self-pay | Admitting: *Deleted

## 2017-12-07 ENCOUNTER — Encounter: Payer: Self-pay | Admitting: Surgery

## 2017-12-07 DIAGNOSIS — C189 Malignant neoplasm of colon, unspecified: Secondary | ICD-10-CM

## 2017-12-07 LAB — PHOSPHORUS: Phosphorus: 2.3 mg/dL — ABNORMAL LOW (ref 2.5–4.6)

## 2017-12-07 LAB — CBC WITH DIFFERENTIAL/PLATELET
BASOS ABS: 0 10*3/uL (ref 0–0.1)
BASOS PCT: 0 %
EOS ABS: 0 10*3/uL (ref 0–0.7)
Eosinophils Relative: 0 %
HEMATOCRIT: 36 % (ref 35.0–47.0)
HEMOGLOBIN: 11.9 g/dL — AB (ref 12.0–16.0)
Lymphocytes Relative: 4 %
Lymphs Abs: 0.5 10*3/uL — ABNORMAL LOW (ref 1.0–3.6)
MCH: 29.5 pg (ref 26.0–34.0)
MCHC: 33.1 g/dL (ref 32.0–36.0)
MCV: 89.2 fL (ref 80.0–100.0)
MONO ABS: 1 10*3/uL — AB (ref 0.2–0.9)
MONOS PCT: 9 %
NEUTROS ABS: 10.4 10*3/uL — AB (ref 1.4–6.5)
NEUTROS PCT: 87 %
Platelets: 242 10*3/uL (ref 150–440)
RBC: 4.04 MIL/uL (ref 3.80–5.20)
RDW: 13.8 % (ref 11.5–14.5)
WBC: 12 10*3/uL — ABNORMAL HIGH (ref 3.6–11.0)

## 2017-12-07 LAB — BASIC METABOLIC PANEL
ANION GAP: 8 (ref 5–15)
BUN: 11 mg/dL (ref 6–20)
CALCIUM: 7.9 mg/dL — AB (ref 8.9–10.3)
CO2: 19 mmol/L — AB (ref 22–32)
CREATININE: 0.58 mg/dL (ref 0.44–1.00)
Chloride: 110 mmol/L (ref 101–111)
GFR calc non Af Amer: >60 mL/min (ref >60)
Glucose, Bld: 163 mg/dL — ABNORMAL HIGH (ref 65–99)
Potassium: 4.7 mmol/L (ref 3.5–5.1)
SODIUM: 137 mmol/L (ref 135–145)

## 2017-12-07 LAB — MAGNESIUM: MAGNESIUM: 1.7 mg/dL (ref 1.7–2.4)

## 2017-12-07 LAB — GLUCOSE, CAPILLARY: Glucose-Capillary: 85 mg/dL (ref 65–99)

## 2017-12-07 MED ORDER — LACTATED RINGERS IV SOLN
INTRAVENOUS | Status: AC
  Administered 2017-12-07: 12:00:00 via INTRAVENOUS

## 2017-12-07 MED ORDER — SODIUM CHLORIDE 0.9% FLUSH
10.0000 mL | Freq: Two times a day (BID) | INTRAVENOUS | Status: DC
  Administered 2017-12-07 – 2017-12-13 (×13): 10 mL

## 2017-12-07 MED ORDER — INSULIN ASPART 100 UNIT/ML ~~LOC~~ SOLN
0.0000 [IU] | Freq: Four times a day (QID) | SUBCUTANEOUS | Status: DC
  Administered 2017-12-09: 2 [IU] via SUBCUTANEOUS
  Administered 2017-12-09 – 2017-12-11 (×5): 1 [IU] via SUBCUTANEOUS
  Filled 2017-12-07 (×6): qty 1

## 2017-12-07 MED ORDER — SODIUM CHLORIDE 0.9% FLUSH: 10.0000 mL | INTRAVENOUS | Status: DC | PRN

## 2017-12-07 MED ORDER — SODIUM GLYCEROPHOSPHATE 1 MMOLE/ML IV SOLN
20.0000 mmol | Freq: Once | INTRAVENOUS | Status: AC
  Administered 2017-12-07: 20 mmol via INTRAVENOUS
  Filled 2017-12-07: qty 20

## 2017-12-07 MED ORDER — ORAL CARE MOUTH RINSE
15.0000 mL | Freq: Two times a day (BID) | OROMUCOSAL | Status: DC
  Administered 2017-12-07 – 2017-12-13 (×13): 15 mL via OROMUCOSAL

## 2017-12-07 MED ORDER — LACTATED RINGERS IV SOLN
INTRAVENOUS | Status: AC
  Administered 2017-12-08: 04:00:00 via INTRAVENOUS

## 2017-12-07 MED ORDER — FAT EMULSION 20 % IV EMUL
250.0000 mL | INTRAVENOUS | Status: AC
  Administered 2017-12-07: 250 mL via INTRAVENOUS
  Filled 2017-12-07: qty 250

## 2017-12-07 MED ORDER — TRACE MINERALS CR-CU-MN-SE-ZN 10-1000-500-60 MCG/ML IV SOLN
INTRAVENOUS | Status: AC
  Administered 2017-12-07: 19:00:00 via INTRAVENOUS
  Filled 2017-12-07: qty 960

## 2017-12-07 NOTE — Care Management (Signed)
Patient had exploratory lap, right hemicolectomy with terminal ileum resection and small bowel resection due to a large cecal mass. Oncology consulting. Oncology will continue to follow pathology and further treatment.  Patient is currently NPO and receiving TPN

## 2017-12-07 NOTE — Progress Notes (Signed)
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  Pathology is pending.  Patient feels well and is smiling today has minimal pain and some cramping.  She is not yet passing gas she wants to eat.  She has a nasogastric tube and a Foley catheter in place  Objective: Vital signs in last 24 hours: Temp:  [97.2 F (36.2 C)-98.5 F (36.9 C)] 98.5 F (36.9 C) (01/16 0322) Pulse Rate:  [90-97] 92 (01/16 0322) Resp:  [14-17] 15 (01/16 0322) BP: (125-147)/(55-71) 147/70 (01/16 0322) SpO2:  [92 %-100 %] 92 % (01/16 0322) Weight:  [223 lb 9.6 oz (101.4 kg)] 223 lb 9.6 oz (101.4 kg) (01/16 0649) Last BM Date: 12/06/17  Intake/Output from previous day: 01/15 0701 - 01/16 0700 In: 1850 [I.V.:1850] Out: 3010 [Urine:950; Emesis/NG output:50; Drains:510; Blood:500] Intake/Output this shift: Total I/O In: -  Out: 100 [Emesis/NG output:100]  Physical exam:  Awake alert and oriented smiling.  Vital signs are stable and reviewed. Wound is clean abdomen is soft nontender serous fluid in drain only.  Nontender calves  Lab Results: CBC  Recent Labs    12/06/17 0411 12/07/17 0432  WBC 11.4* 12.0*  HGB 12.0 11.9*  HCT 35.9 36.0  PLT 213 242   BMET Recent Labs    12/06/17 0411 12/07/17 0432  NA 135 137  K 4.2 4.7  CL 107 110  CO2 22 19*  GLUCOSE 135* 163*  BUN 10 11  CREATININE 0.59 0.58  CALCIUM 7.9* 7.9*   PT/INR No results for input(s): LABPROT, INR in the last 72 hours. ABG No results for input(s): PHART, HCO3 in the last 72 hours.  Invalid input(s): PCO2, PO2  Studies/Results: No results found.  Anti-infectives: Anti-infectives (From admission, onward)   Start     Dose/Rate Route Frequency Ordered Stop   12/04/17 1000  cefTRIAXone (ROCEPHIN) 2 g in dextrose 5 % 50 mL IVPB     2 g 100 mL/hr over 30 Minutes Intravenous Every 24  hours 12/03/17 0704     12/03/17 0900  metroNIDAZOLE (FLAGYL) IVPB 500 mg     500 mg 100 mL/hr over 60 Minutes Intravenous Every 8 hours 12/03/17 0847     12/03/17 0700  cefTRIAXone (ROCEPHIN) 2 g in dextrose 5 % 50 mL IVPB     2 g 100 mL/hr over 30 Minutes Intravenous  Once 12/03/17 0650 12/03/17 0752   12/03/17 0700  azithromycin (ZITHROMAX) 500 mg in dextrose 5 % 250 mL IVPB     500 mg 250 mL/hr over 60 Minutes Intravenous  Once 12/03/17 0650 12/03/17 0836      Assessment/Plan: s/p Procedure(s): COLON RESECTION RIGHT   Gust with nutrition as well as pharmacy.  TPN to be started PICC line to be placed.  Patient doing very well at this point but will benefit from supplemental nutrition until she is able to eat which may be several days.  She wanted to eat today but I reminded her of the nasogastric tube and its purpose.  Labs show mild acidosis but will continue to observe as that was present in the days preoperatively as well.  Florene Glen, MD, FACS  12/07/2017

## 2017-12-07 NOTE — Progress Notes (Signed)
I will follow up with the patient 2 weeks post discharge to discuss final pathology and treatment recommendations. My office will set up appointment prior to discharge  Dr. Randa Evens, MD, MPH [facility] Pager- [phone] 12/07/2017 7:09 AM

## 2017-12-07 NOTE — Progress Notes (Addendum)
Initial Nutrition Assessment  DOCUMENTATION CODES:   Obesity unspecified  INTERVENTION:  Once PICC placed and confirmed, recommend initiating Clinimix E 5/15 at 40 mL/hr + 20% ILE at 20 mL/hr over 12 hours. After 24 hours if electrolytes, CBGs, and triglycerides are within acceptable range, recommend advancing to goal regimen of Clinimix E 5/15 at 83 mL/hr + 20% ILE at 20 mL/hr over 12 hours. Provides 1894 kcal, 100 grams of protein, 1992 mL fluid daily.  Provide adult MVI and trace elements as additives in TPN.  Plan is for total fluid volume of 80-90 mL/hr.  NUTRITION DIAGNOSIS:   Inadequate oral intake related to acute illness(abdominal pain, constipation, cecal mass) as evidenced by per patient/family report.  GOAL:   Patient will meet greater than or equal to 90% of their needs  MONITOR:   Diet advancement, Labs, Weight trends, Skin, I & O's  REASON FOR ASSESSMENT:   Consult New TPN/TNA  ASSESSMENT:   72 year old female with PMHx of developmental disorder, mood disorder, dementia, who presents from adult care home with dizziness, abdominal pain, constipation found to have large cecal mass on CT. Patient now s/p exploratory laparotomy, right hemicolectomy with terminal ileum resection, separate bowel resection on 1/15.   -Pending surgical pathology.  Met with patient at bedside. She reports that about 2 days PTA she began having "stomach troubles." She describes abdominal pain and constipation. She also reports she became very weak, was unable to take care of herself and even fell on the ground. She was not able to eat much of anything those 2 days PTA and was only on clear liquids here before being made NPO (reports she only had one small clear liquid meal). Patient is now day 6 of inadequate intake. She reports that when she was feeling well she ate 3 meals per day + one snack each evening. She typically is able to finish all of her meals, but recently had been asking them  for smaller portions as she wanted to lose weight. Discussed that patient will now have increased needs for calories and protein for healing. Encouraged her to hold off on trying to lose any more weight as the goal will now be to remain weight stable and maintain her lean body mass.  Patient reports her UBW is usually around 200 lbs or a little over. Fluctuates some as she was trying to lose weight PTA. Patient was 206.7 lbs (93.8 kg) on 1/12. Current weight likely falsely elevated with fluid. Will use 93.8 kg to estimate needs.  No central IV access at this time. Order is in for PICC placement this AM.  Medications reviewed and include: Oscal with D 1 tablet daily PO (held as patient is NPO), ceftriaxone, Flagyl, D5NS with KCl 20 mEq/L at 100 mL/hr (120 grams dextrose, 408 kcal daily).  Labs reviewed: CO2 19.  Patient does not meet criteria for malnutrition at this time, but is at risk for malnutrition.  Discussed with RN, Surgery, and Pharmacy.  NUTRITION - FOCUSED PHYSICAL EXAM:    Most Recent Value  Orbital Region  No depletion  Upper Arm Region  No depletion  Thoracic and Lumbar Region  No depletion  Buccal Region  No depletion  Temple Region  No depletion  Clavicle Bone Region  No depletion  Clavicle and Acromion Bone Region  No depletion  Scapular Bone Region  No depletion  Dorsal Hand  No depletion  Patellar Region  No depletion  Anterior Thigh Region  No depletion  Posterior Calf Region  No depletion  Edema (RD Assessment)  -- [non-pitting edema to bilateral upper and lower extremities]  Hair  Reviewed [alopecia]  Eyes  Reviewed  Mouth  Reviewed  Skin  Reviewed [pallor]  Nails  Reviewed     Diet Order:  Diet NPO time specified .TPN (CLINIMIX-E) Adult  EDUCATION NEEDS:   Education needs have been addressed  Skin:  Skin Assessment: Skin Integrity Issues: Skin Integrity Issues:: Incisions Incisions: closed incision to abdomen  Last BM:  12/06/2017 - large type  7  Height:   Ht Readings from Last 1 Encounters:  12/06/17 _0  (1.651 m)    Weight:   Wt Readings from Last 1 Encounters:  12/07/17 223 lb 9.6 oz (101.4 kg)    Ideal Body Weight:  56.8 kg  BMI:  Body mass index is 37.21 kg/m.  Estimated Nutritional Needs:   Kcal:  1760-2060 (MSJ x 1.2-1.4)  Protein:  95-115 grams (1-1.2 grams/kg)  Fluid:  1.7-2 L/day (30-35 mL/kg IBW)  Willey Blade, MS, RD, LDN Office: [phone] Pager: [phone] After Hours/Weekend Pager: [phone]

## 2017-12-07 NOTE — Progress Notes (Signed)
Peripherally Inserted Central Catheter/Midline Placement  The IV Nurse has discussed with the patient and/or persons authorized to consent for the patient, the purpose of this procedure and the potential benefits and risks involved with this procedure.  The benefits include less needle sticks, lab draws from the catheter, and the patient may be discharged home with the catheter. Risks include, but not limited to, infection, bleeding, blood clot (thrombus formation), and puncture of an artery; nerve damage and irregular heartbeat and possibility to perform a PICC exchange if needed/ordered by physician.  Alternatives to this procedure were also discussed.  Bard Power PICC patient education guide, fact sheet on infection prevention and patient information card has been provided to patient /or left at bedside.    PICC/Midline Placement Documentation  PICC Double Lumen 28/20/60 PICC Right Basilic 39 cm (Active)  Indication for Insertion or Continuance of Line Administration of hyperosmolar/irritating solutions (i.e. TPN, Vancomycin, etc.) 12/07/2017 10:00 AM  Exposed Catheter (cm) 0 cm 12/07/2017 10:00 AM  Site Assessment Clean;Dry;Intact 12/07/2017 10:00 AM  Lumen #1 Status Flushed;Saline locked;Blood return noted 12/07/2017 10:00 AM  Lumen #2 Status Flushed;Saline locked;Blood return noted 12/07/2017 10:00 AM  Dressing Type Transparent;Securing device 12/07/2017 10:00 AM  Dressing Status Clean;Dry;Intact;Antimicrobial disc in place 12/07/2017 10:00 AM  Dressing Change Due 12/14/17 12/07/2017 10:00 AM  PICC placed by Christella Noa RN     Frances Maywood 12/07/2017, 10:05 AM

## 2017-12-07 NOTE — Progress Notes (Signed)
[facility] [place] NAME: Donelle Hise    MR#:  [id number]  DATE OF BIRTH:  05/22/1946  SUBJECTIVE:  CHIEF COMPLAINT:   Chief Complaint  Patient presents with  . Dizziness  no complaints  REVIEW OF SYSTEMS:  CONSTITUTIONAL: No fever, fatigue or weakness.  EYES: No blurred or double vision.  EARS, NOSE, AND THROAT: No tinnitus or ear pain.  RESPIRATORY: No cough, shortness of breath, wheezing or hemoptysis.  CARDIOVASCULAR: No chest pain, orthopnea, edema.  GASTROINTESTINAL: No nausea, vomiting, diarrhea or abdominal pain.  GENITOURINARY: No dysuria, hematuria.  ENDOCRINE: No polyuria, nocturia,  HEMATOLOGY: No anemia, easy bruising or bleeding SKIN: No rash or lesion. MUSCULOSKELETAL: No joint pain or arthritis.   NEUROLOGIC: No tingling, numbness, weakness.  PSYCHIATRY: No anxiety or depression.   ROS  DRUG ALLERGIES:  No Known Allergies  VITALS:  Blood pressure (!) 147/70, pulse 92, temperature 98.5 F (36.9 C), temperature source Oral, resp. rate 15, height 5\' 5"  (1.651 m), weight 101.4 kg (223 lb 9.6 oz), SpO2 92 %.  PHYSICAL EXAMINATION:  GENERAL:  72 y.o.-year-old patient lying in the bed with no acute distress.  EYES: Pupils equal, round, reactive to light and accommodation. No scleral icterus. Extraocular muscles intact.  HEENT: Head atraumatic, normocephalic. Oropharynx and nasopharynx clear.  NECK:  Supple, no jugular venous distention. No thyroid enlargement, no tenderness.  LUNGS: Normal breath sounds bilaterally, no wheezing, rales,rhonchi or crepitation. No use of accessory muscles of respiration.  CARDIOVASCULAR: S1, S2 normal. No murmurs, rubs, or gallops.  ABDOMEN: Soft, nontender, nondistended. Bowel sounds present. No organomegaly or mass.  EXTREMITIES: No pedal edema, cyanosis, or clubbing.  NEUROLOGIC: Cranial nerves II through XII are intact. Muscle strength 5/5 in all extremities. Sensation intact. Gait  not checked.  PSYCHIATRIC: The patient is alert and oriented x 3.  SKIN: No obvious rash, lesion, or ulcer.   Physical Exam LABORATORY PANEL:   CBC Recent Labs  Lab 12/07/17 0432  WBC 12.0*  HGB 11.9*  HCT 36.0  PLT 242   ------------------------------------------------------------------------------------------------------------------  Chemistries  Recent Labs  Lab 12/03/17 0436  12/07/17 0432  NA 127*   < > 137  K 3.2*   < > 4.7  CL 98*   < > 110  CO2 15*   < > 19*  GLUCOSE 175*   < > 163*  BUN 11   < > 11  CREATININE 1.35*   < > 0.58  CALCIUM 8.8*   < > 7.9*  MG  --   --  1.7  AST 55*  --   --   ALT 23  --   --   ALKPHOS 47  --   --   BILITOT 1.4*  --   --    < > = values in this interval not displayed.   ------------------------------------------------------------------------------------------------------------------  Cardiac Enzymes Recent Labs  Lab 12/03/17 1345 12/03/17 2026  TROPONINI 0.21* 0.16*   ------------------------------------------------------------------------------------------------------------------  RADIOLOGY:  No results found.  ASSESSMENT AND PLAN:  1acute sepsis Resolved Secondary to acute large cecal mass with associatedacuteappendicitis Continue sepsis protocol,empiric Rocephin/Flagyl  2acute appendicitis S/p Expl Lap Rt. HC w/ JP drain placement 12/06/17   3acute newly diagnosed cecal mass Status post resection as stated above  To follow-up with oncology status post discharge for reevaluation/pathology results will be shared at that time   4acute kidney injury Resolved with IV fluids for rehydration  5chronic schizophrenia Stable Continue home psychotropic regiment  6chronic morbid obesity Most likely secondary to excess calories Lifestyle modification recommended  7elevated troponins Most likely secondary to sepsis/demand ischemia Echocardiogram noted for mild diastolic dysfunction cardiology input  appreciated-will follow-up with cardiology s/pdischarge for continued outpatient management/evaluation   All the records are reviewed and case discussed with Care Management/Social Workerr. Management plans discussed with the patient, family and they are in agreement.  CODE STATUS: full  TOTAL TIME TAKING CARE OF THIS PATIENT: 35 minutes.     POSSIBLE D/C IN 3-5 DAYS, DEPENDING ON CLINICAL CONDITION.   Avel Peace Orlondo Holycross M.D on 12/07/2017   Between 7am to 6pm - Pager - [phone]  After 6pm go to [email or web address] - password [email or web address] [facility]  Office  [phone]  CC: Primary care physician; Randel Pigg, MD  Note: This dictation was prepared with Dragon dictation along with smaller phrase technology. Any transcriptional errors that result from this process are unintentional.

## 2017-12-07 NOTE — Progress Notes (Signed)
SUBJECTIVE: No chest pain. Status post hemicolectomy with terminal ileum and small bowel resection.   Vitals:   12/06/17 1848 12/06/17 1928 12/07/17 0322 12/07/17 0649  BP: (!) 125/55 (!) 141/64 (!) 147/70   Pulse: 90 93 92   Resp:  15 15   Temp:  97.7 F (36.5 C) 98.5 F (36.9 C)   TempSrc:  Oral Oral   SpO2: 95% 96% 92%   Weight:    223 lb 9.6 oz (101.4 kg)  Height:        Intake/Output Summary (Last 24 hours) at 12/07/2017 0918 Last data filed at 12/07/2017 0843 Gross per 24 hour  Intake 1850 ml  Output 3110 ml  Net -1260 ml    LABS: Basic Metabolic Panel: Recent Labs    12/06/17 0411 12/07/17 0432  NA 135 137  K 4.2 4.7  CL 107 110  CO2 22 19*  GLUCOSE 135* 163*  BUN 10 11  CREATININE 0.59 0.58  CALCIUM 7.9* 7.9*   Liver Function Tests: No results for input(s): AST, ALT, ALKPHOS, BILITOT, PROT, ALBUMIN in the last 72 hours. No results for input(s): LIPASE, AMYLASE in the last 72 hours. CBC: Recent Labs    12/06/17 0411 12/07/17 0432  WBC 11.4* 12.0*  NEUTROABS 9.2* 10.4*  HGB 12.0 11.9*  HCT 35.9 36.0  MCV 89.1 89.2  PLT 213 242   Cardiac Enzymes: No results for input(s): CKTOTAL, CKMB, CKMBINDEX, TROPONINI in the last 72 hours. BNP: Invalid input(s): POCBNP D-Dimer: No results for input(s): DDIMER in the last 72 hours. Hemoglobin A1C: No results for input(s): HGBA1C in the last 72 hours. Fasting Lipid Panel: No results for input(s): CHOL, HDL, LDLCALC, TRIG, CHOLHDL, LDLDIRECT in the last 72 hours. Thyroid Function Tests: No results for input(s): TSH, T4TOTAL, T3FREE, THYROIDAB in the last 72 hours.  Invalid input(s): FREET3 Anemia Panel: No results for input(s): VITAMINB12, FOLATE, FERRITIN, TIBC, IRON, RETICCTPCT in the last 72 hours.   PHYSICAL EXAM General: Well developed, well nourished, in no acute distress HEENT:  Normocephalic and atramatic Neck:  No JVD.  Lungs: Clear bilaterally to auscultation and percussion. Heart: HRRR .  Normal S1 and S2 without gallops or murmurs.   Msk:  Back normal, normal gait. Normal strength and tone for age. Extremities: No clubbing, cyanosis or edema.   Neuro: Alert and oriented X 3. Psych:  Good affect, responds appropriately  TELEMETRY: Sinus rhythm by ausculation, 88bpm  ASSESSMENT AND PLAN: No further chest pain. Status post hemicolectomy with terminal ileum and small bowel resection. She tolerated anesthesia well and is recovering well this morning.   Active Problems:   Sepsis ([place])   Colonic mass   Lactic acidosis    Jake Bathe, NP-C 12/07/2017 9:18 AM Cell: [phone]

## 2017-12-07 NOTE — Consult Note (Signed)
PHARMACY - ADULT TOTAL PARENTERAL NUTRITION CONSULT NOTE   Pharmacy Consult for TPN/electrolyte/glucose managment Indication: day 6 inadequate intake, cecal mass s/p right hemicolectomy with terminal ileum resection and separate small bowel resection  Patient Measurements: Height: 5\' 5"  (165.1 cm) Weight: 223 lb 9.6 oz (101.4 kg) IBW/kg (Calculated) : 57 TPN AdjBW (KG): 67.7 Body mass index is 37.21 kg/m. Usual Weight:   Assessment:   GI:  Endo:  Insulin requirements in the past 24 hours: 0 Lytes: k=4.7, Mg=1.7, phos=2.3 Renal:scr=0.58 Pulm: Cards:  Hepatobil: Neuro: [id number] 2g q24hr, metronidazole 500mg  q 8hr  TPN Access:1/16 TPN start date: 1/16 Nutritional Goals (per RD recommendation on 1/16): [id number] Protein: 95-115g Fluid:1.7-2L/day  Goal TPN rate is 83 ml/hr (Provides 1894 kcal, 100 grams of protein, 1992 mL fluid daily.)  Current Nutrition: NPO  Plan:  Clinimix-E 5/15 at 103mL/hr. Lipids 20% 20ml/hr x 12 hr Add MVI, trace elements to TPN CBG and sensitive SSI q 6 hr and adjust as needed K=4.7 will stop fluids w/ KCL and start LR  Lactated ringers @ 40 ml/hr for a total volume of 80 ml/hr per surgery request Phos is low @ 2.3- will give glycophos 20 mmol IV once. All other electrolytes are WNL Monitor TPN labs, K, Mg, Phos daily for at least the first 3 days CBC weekly, TG prealbumin tomorrow F/U tomorrow  Ramond Dial, Pharm.D, BCPS Clinical Pharmacist  12/07/2017,9:29 AM

## 2017-12-08 ENCOUNTER — Telehealth: Payer: Self-pay | Admitting: *Deleted

## 2017-12-08 LAB — COMPREHENSIVE METABOLIC PANEL
ALBUMIN: 2 g/dL — AB (ref 3.5–5.0)
ALK PHOS: 59 U/L (ref 38–126)
ALT: 28 U/L (ref 14–54)
AST: 44 U/L — AB (ref 15–41)
Anion gap: 7 (ref 5–15)
BILIRUBIN TOTAL: 0.4 mg/dL (ref 0.3–1.2)
BUN: 18 mg/dL (ref 6–20)
CO2: 27 mmol/L (ref 22–32)
CREATININE: 0.53 mg/dL (ref 0.44–1.00)
Calcium: 8.1 mg/dL — ABNORMAL LOW (ref 8.9–10.3)
Chloride: 102 mmol/L (ref 101–111)
GFR calc Af Amer: >60 mL/min (ref >60)
GLUCOSE: 115 mg/dL — AB (ref 65–99)
Potassium: 3.6 mmol/L (ref 3.5–5.1)
Sodium: 136 mmol/L (ref 135–145)
TOTAL PROTEIN: 4.9 g/dL — AB (ref 6.5–8.1)

## 2017-12-08 LAB — PREALBUMIN: PREALBUMIN: 8.1 mg/dL — AB (ref 18–38)

## 2017-12-08 LAB — CBC WITH DIFFERENTIAL/PLATELET
BASOS ABS: 0 10*3/uL (ref 0–0.1)
BLASTS: 0 %
Band Neutrophils: 4 %
Basophils Relative: 0 %
EOS PCT: 0 %
Eosinophils Absolute: 0 10*3/uL (ref 0–0.7)
HEMATOCRIT: 33.3 % — AB (ref 35.0–47.0)
Hemoglobin: 11 g/dL — ABNORMAL LOW (ref 12.0–16.0)
LYMPHS ABS: 1.5 10*3/uL (ref 1.0–3.6)
Lymphocytes Relative: 10 %
MCH: 29.3 pg (ref 26.0–34.0)
MCHC: 33 g/dL (ref 32.0–36.0)
MCV: 88.6 fL (ref 80.0–100.0)
MYELOCYTES: 0 %
Metamyelocytes Relative: 0 %
Monocytes Absolute: 1.9 10*3/uL — ABNORMAL HIGH (ref 0.2–0.9)
Monocytes Relative: 13 %
NEUTROS PCT: 70 %
NRBC: 0 /100{WBCs}
Neutro Abs: 10.8 10*3/uL — ABNORMAL HIGH (ref 1.4–6.5)
Other: 3 %
PROMYELOCYTES ABS: 0 %
Platelets: 313 10*3/uL (ref 150–440)
RBC: 3.76 MIL/uL — AB (ref 3.80–5.20)
RDW: 13.6 % (ref 11.5–14.5)
WBC: 14.6 10*3/uL — ABNORMAL HIGH (ref 3.6–11.0)

## 2017-12-08 LAB — GLUCOSE, CAPILLARY
Glucose-Capillary: 102 mg/dL — ABNORMAL HIGH (ref 65–99)
Glucose-Capillary: 104 mg/dL — ABNORMAL HIGH (ref 65–99)
Glucose-Capillary: 108 mg/dL — ABNORMAL HIGH (ref 65–99)
Glucose-Capillary: 99 mg/dL (ref 65–99)

## 2017-12-08 LAB — PATHOLOGIST SMEAR REVIEW

## 2017-12-08 LAB — MRSA PCR SCREENING: MRSA BY PCR: NEGATIVE

## 2017-12-08 LAB — TRIGLYCERIDES: TRIGLYCERIDES: 201 mg/dL — AB (ref <150)

## 2017-12-08 LAB — MAGNESIUM: Magnesium: 1.8 mg/dL (ref 1.7–2.4)

## 2017-12-08 LAB — PHOSPHORUS: Phosphorus: 2.7 mg/dL (ref 2.5–4.6)

## 2017-12-08 MED ORDER — TRACE MINERALS CR-CU-MN-SE-ZN 10-1000-500-60 MCG/ML IV SOLN
INTRAVENOUS | Status: AC
  Administered 2017-12-08: 18:00:00 via INTRAVENOUS
  Filled 2017-12-08: qty 1992

## 2017-12-08 MED ORDER — FAT EMULSION 20 % IV EMUL
250.0000 mL | INTRAVENOUS | Status: AC
  Administered 2017-12-08: 250 mL via INTRAVENOUS
  Filled 2017-12-08: qty 250

## 2017-12-08 NOTE — Progress Notes (Signed)
SUBJECTIVE: Patient is doing much better after surgery   Vitals:   12/07/17 0322 12/07/17 0649 12/07/17 1937 12/08/17 0348  BP: (!) 147/70  134/64 (!) 148/65  Pulse: 92  95 88  Resp: 15  15 16   Temp: 98.5 F (36.9 C)  98.6 F (37 C) 97.9 F (36.6 C)  TempSrc: Oral  Oral Oral  SpO2: 92%  99% 93%  Weight:  223 lb 9.6 oz (101.4 kg)  219 lb 12.8 oz (99.7 kg)  Height:        Intake/Output Summary (Last 24 hours) at 12/08/2017 0846 Last data filed at 12/08/2017 0500 Gross per 24 hour  Intake 1124.66 ml  Output 2640 ml  Net -1515.34 ml    LABS: Basic Metabolic Panel: Recent Labs    12/07/17 0432 12/08/17 0439  NA 137 136  K 4.7 3.6  CL 110 102  CO2 19* 27  GLUCOSE 163* 115*  BUN 11 18  CREATININE 0.58 0.53  CALCIUM 7.9* 8.1*  MG 1.7 1.8  PHOS 2.3* 2.7   Liver Function Tests: Recent Labs    12/08/17 0439  AST 44*  ALT 28  ALKPHOS 59  BILITOT 0.4  PROT 4.9*  ALBUMIN 2.0*   No results for input(s): LIPASE, AMYLASE in the last 72 hours. CBC: Recent Labs    12/07/17 0432 12/08/17 0439  WBC 12.0* 14.6*  NEUTROABS 10.4* 10.8*  HGB 11.9* 11.0*  HCT 36.0 33.3*  MCV 89.2 88.6  PLT 242 313   Cardiac Enzymes: No results for input(s): CKTOTAL, CKMB, CKMBINDEX, TROPONINI in the last 72 hours. BNP: Invalid input(s): POCBNP D-Dimer: No results for input(s): DDIMER in the last 72 hours. Hemoglobin A1C: No results for input(s): HGBA1C in the last 72 hours. Fasting Lipid Panel: Recent Labs    12/08/17 0439  TRIG 201*   Thyroid Function Tests: No results for input(s): TSH, T4TOTAL, T3FREE, THYROIDAB in the last 72 hours.  Invalid input(s): FREET3 Anemia Panel: No results for input(s): VITAMINB12, FOLATE, FERRITIN, TIBC, IRON, RETICCTPCT in the last 72 hours.   PHYSICAL EXAM General: Well developed, well nourished, in no acute distress HEENT:  Normocephalic and atramatic Neck:  No JVD.  Lungs: Clear bilaterally to auscultation and percussion. Heart:  HRRR . Normal S1 and S2 without gallops or murmurs.  Abdomen: Bowel sounds are positive, abdomen soft and non-tender  Msk:  Back normal, normal gait. Normal strength and tone for age. Extremities: No clubbing, cyanosis or edema.   Neuro: Alert and oriented X 3. 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 She also had mildly elevated troponin probably has coronary artery disease and will do outpatient CTA coronaries upon discharge.  Active Problems:   Sepsis ([place])   Colonic mass   Lactic acidosis    Greogory Cornette A, MD, [facility] 12/08/2017 8:46 AM

## 2017-12-08 NOTE — Progress Notes (Signed)
[place] at [place] NAME: Latoya Jones    MR#:  [id number]  DATE OF BIRTH:  1946-02-12  SUBJECTIVE:  CHIEF COMPLAINT:   Chief Complaint  Patient presents with  . Dizziness  Patient without complaints, no events overnight per nursing staff, high output NG to drainage noted, pain is well controlled on current regimen, discontinue Foley  REVIEW OF SYSTEMS:  CONSTITUTIONAL: No fever, fatigue or weakness.  EYES: No blurred or double vision.  EARS, NOSE, AND THROAT: No tinnitus or ear pain.  RESPIRATORY: No cough, shortness of breath, wheezing or hemoptysis.  CARDIOVASCULAR: No chest pain, orthopnea, edema.  GASTROINTESTINAL: No nausea, vomiting, diarrhea or abdominal pain.  GENITOURINARY: No dysuria, hematuria.  ENDOCRINE: No polyuria, nocturia,  HEMATOLOGY: No anemia, easy bruising or bleeding SKIN: No rash or lesion. MUSCULOSKELETAL: No joint pain or arthritis.   NEUROLOGIC: No tingling, numbness, weakness.  PSYCHIATRY: No anxiety or depression.   ROS  DRUG ALLERGIES:  No Known Allergies  VITALS:  Blood pressure (!) 148/65, pulse 88, temperature 97.9 F (36.6 C), temperature source Oral, resp. rate 16, height 5\' 5"  (1.651 m), weight 99.7 kg (219 lb 12.8 oz), SpO2 93 %.  PHYSICAL EXAMINATION:  GENERAL:  72 y.o.-year-old patient lying in the bed with no acute distress.  Obese EYES: Pupils equal, round, reactive to light and accommodation. No scleral icterus. Extraocular muscles intact.  HEENT: Head atraumatic, normocephalic. Oropharynx and nasopharynx clear.  NECK:  Supple, no jugular venous distention. No thyroid enlargement, no tenderness.  LUNGS: Normal breath sounds bilaterally, no wheezing, rales,rhonchi or crepitation. No use of accessory muscles of respiration.  CARDIOVASCULAR: S1, S2 normal. No murmurs, rubs, or gallops.  ABDOMEN: Soft, incisional tenderness only, nondistended. Bowel sounds present. No organomegaly or mass.   EXTREMITIES: No pedal edema, cyanosis, or clubbing.  NEUROLOGIC: Cranial nerves II through XII are intact. MAES. Gait not checked.  PSYCHIATRIC: The patient is alert and oriented x 3.  SKIN: No obvious rash, lesion, or ulcer.   Physical Exam LABORATORY PANEL:   CBC Recent Labs  Lab 12/08/17 0439  WBC 14.6*  HGB 11.0*  HCT 33.3*  PLT 313   ------------------------------------------------------------------------------------------------------------------  Chemistries  Recent Labs  Lab 12/08/17 0439  NA 136  K 3.6  CL 102  CO2 27  GLUCOSE 115*  BUN 18  CREATININE 0.53  CALCIUM 8.1*  MG 1.8  AST 44*  ALT 28  ALKPHOS 59  BILITOT 0.4   ------------------------------------------------------------------------------------------------------------------  Cardiac Enzymes Recent Labs  Lab 12/03/17 1345 12/03/17 2026  TROPONINI 0.21* 0.16*   ------------------------------------------------------------------------------------------------------------------  RADIOLOGY:  No results found.  ASSESSMENT AND PLAN:  1acute sepsis Resolved Secondary to acute large cecal mass with associatedacuteappendicitis Continue sepsis protocol,empiric Rocephin/Flagyl  2acute appendicitis Resolving Continue empiric antibiotics for 7-day course S/p Expl Lap Rt. HC w/ JP drain placement 12/06/17  3acute newly diagnosed cecal mass S/P resection as stated above  To follow-up with oncology s/p discharge for re-evaluation/pathology results will be shared at that time   4acute kidney injury Resolved with IV fluids for rehydration  5chronic schizophrenia Stable Continue home psychotropic regiment  6chronic morbid obesity Most likely secondary to excess calories Lifestyle modification recommended  7elevated troponins Most likely secondary to sepsis/demand ischemia Echocardiogram noted for mild diastolic dysfunction cardiology input appreciated-will follow-up with  cardiology s/pdischarge for continued outpatient management/evaluationCTA of coronaries  All the records are reviewed and case discussed with Care Management/Social Workerr. Management plans discussed with the patient, family and  they are in agreement.  CODE STATUS: full  TOTAL TIME TAKING CARE OF THIS PATIENT: 35 minutes.     POSSIBLE D/C IN 3-6 DAYS, DEPENDING ON CLINICAL CONDITION.   Avel Peace Raidyn Breiner M.D on 12/08/2017   Between 7am to 6pm - Pager - [phone]  After 6pm go to [email or web address] - password [email or web address] [place] [facility]  Office  [phone]  CC: Primary care physician; Randel Pigg, MD  Note: This dictation was prepared with Dragon dictation along with smaller phrase technology. Any transcriptional errors that result from this process are unintentional.

## 2017-12-08 NOTE — Clinical Social Work Note (Signed)
Patient is from [facility], patient had surgery yesterday, CSW continuing to follow patient's progress throughout discharge planning.  Jones Broom. [place]ley, MSW, [place]  12/08/2017 6:11 PM

## 2017-12-08 NOTE — Progress Notes (Signed)
Visited patient and family member. No flatus yet, min iif any pain Aaawaiting BFxn

## 2017-12-08 NOTE — Progress Notes (Signed)
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  Pathology is pending.  Patient feels better today she has minimal if any pain no nausea or vomiting.  Objective: Vital signs in last 24 hours: Temp:  [97.9 F (36.6 C)-98.6 F (37 C)] 97.9 F (36.6 C) (01/17 0348) Pulse Rate:  [88-95] 88 (01/17 0348) Resp:  [15-16] 16 (01/17 0348) BP: (134-148)/(64-65) 148/65 (01/17 0348) SpO2:  [93 %-99 %] 93 % (01/17 0348) Weight:  [219 lb 12.8 oz (99.7 kg)] 219 lb 12.8 oz (99.7 kg) (01/17 0348) Last BM Date: 12/06/17  Intake/Output from previous day: 01/16 0701 - 01/17 0700 In: 1124.7 [I.V.:524.7; IV Piggyback:600] Out: 0630 [Urine:1300; Emesis/NG output:1400; Drains:40] Intake/Output this shift: No intake/output data recorded.  Physical exam:  Vital signs are stable and reviewed.  Abdomen is soft nondistended nontympanitic and nontender wounds are dressed.  Drain shows serous fluid only.  Nontender calves with edema  Lab Results: CBC  Recent Labs    12/07/17 0432 12/08/17 0439  WBC 12.0* 14.6*  HGB 11.9* 11.0*  HCT 36.0 33.3*  PLT 242 313   BMET Recent Labs    12/07/17 0432 12/08/17 0439  NA 137 136  K 4.7 3.6  CL 110 102  CO2 19* 27  GLUCOSE 163* 115*  BUN 11 18  CREATININE 0.58 0.53  CALCIUM 7.9* 8.1*   PT/INR No results for input(s): LABPROT, INR in the last 72 hours. ABG No results for input(s): PHART, HCO3 in the last 72 hours.  Invalid input(s): PCO2, PO2  Studies/Results: No results found.  Anti-infectives: Anti-infectives (From admission, onward)   Start     Dose/Rate Route Frequency Ordered Stop   12/04/17 1000  cefTRIAXone (ROCEPHIN) 2 g in dextrose 5 % 50 mL IVPB     2 g 100 mL/hr over 30 Minutes Intravenous Every 24 hours 12/03/17 0704     12/03/17 0900  metroNIDAZOLE (FLAGYL) IVPB 500 mg     500 mg 100 mL/hr over 60 Minutes Intravenous Every 8 hours 12/03/17 0847      12/03/17 0700  cefTRIAXone (ROCEPHIN) 2 g in dextrose 5 % 50 mL IVPB     2 g 100 mL/hr over 30 Minutes Intravenous  Once 12/03/17 0650 12/03/17 0752   12/03/17 0700  azithromycin (ZITHROMAX) 500 mg in dextrose 5 % 250 mL IVPB     500 mg 250 mL/hr over 60 Minutes Intravenous  Once 12/03/17 0650 12/03/17 0836      Assessment/Plan: s/p Procedure(s): COLON RESECTION RIGHT   TPN instituted.  Albumin 2.0.  Continue supportive care at this time will DC the Foley catheter this morning and await pathology.  Awaiting bowel function to remove nasogastric tube.  Florene Glen, MD, FACS  12/08/2017

## 2017-12-08 NOTE — Telephone Encounter (Signed)
I spoke to scottie on 1c and let him know that pt. Has appt with Dr. Janese Banks as outpt for 1/31.  Scottie says that he can see it and he will make a note to put it on her d/c note once she is ready to be d/c from hosp.

## 2017-12-08 NOTE — Consult Note (Signed)
PHARMACY - ADULT TOTAL PARENTERAL NUTRITION CONSULT NOTE   Pharmacy Consult for TPN/electrolyte/glucose managment Indication: day 6 inadequate intake, cecal mass s/p right hemicolectomy with terminal ileum resection and separate small bowel resection  Patient Measurements: Height: 5\' 5"  (165.1 cm) Weight: 219 lb 12.8 oz (99.7 kg) IBW/kg (Calculated) : 57 TPN AdjBW (KG): 67.7 Body mass index is 36.58 kg/m. Usual Weight:   Assessment:   GI:  Endo:  Insulin requirements in the past 24 hours: 0 Lytes: k=3.6, Mg=1.8, phos=2.7 Renal:scr=0.53 Pulm: Cards:  Hepatobil: Neuro: [id number] 2g q24hr, metronidazole 500mg  q 8hr  TPN Access:1/16 TPN start date: 1/16 Nutritional Goals (per RD recommendation on 1/16): [id number] Protein: 95-115g Fluid:1.7-2L/day  Goal TPN rate is 83 ml/hr (Provides 1894 kcal, 100 grams of protein, 1992 mL fluid daily.)  Current Nutrition: NPO  Plan:  Increase Clinimix-E 5/15 to goal rate of 56ml/hr Lipids 20% 62ml/hr x 12 hr Add MVI, trace elements to TPN CBG and sensitive SSI q 6 hr and adjust as needed Stop Lactated ringers as pt will be at goal volume with TPN alone All electrolytes are WNL Monitor TPN labs, K, Mg, Phos daily for at least the first 3 days CBC weekly, TG prealbumin tomorrow F/U tomorrow  Ramond Dial, Pharm.D, BCPS Clinical Pharmacist  12/08/2017,8:01 AM

## 2017-12-09 LAB — CBC WITH DIFFERENTIAL/PLATELET
BASOS ABS: 0.1 10*3/uL (ref 0–0.1)
BASOS PCT: 1 %
Eosinophils Absolute: 0.2 10*3/uL (ref 0–0.7)
Eosinophils Relative: 2 %
HEMATOCRIT: 29.6 % — AB (ref 35.0–47.0)
Hemoglobin: 9.8 g/dL — ABNORMAL LOW (ref 12.0–16.0)
LYMPHS PCT: 14 %
Lymphs Abs: 1.7 10*3/uL (ref 1.0–3.6)
MCH: 29.5 pg (ref 26.0–34.0)
MCHC: 33.1 g/dL (ref 32.0–36.0)
MCV: 88.9 fL (ref 80.0–100.0)
MONO ABS: 1.9 10*3/uL — AB (ref 0.2–0.9)
Monocytes Relative: 15 %
NEUTROS ABS: 8.4 10*3/uL — AB (ref 1.4–6.5)
Neutrophils Relative %: 68 %
Platelets: 331 10*3/uL (ref 150–440)
RBC: 3.33 MIL/uL — AB (ref 3.80–5.20)
RDW: 13.9 % (ref 11.5–14.5)
WBC: 12.2 10*3/uL — AB (ref 3.6–11.0)

## 2017-12-09 LAB — MAGNESIUM: MAGNESIUM: 1.8 mg/dL (ref 1.7–2.4)

## 2017-12-09 LAB — GLUCOSE, CAPILLARY
Glucose-Capillary: 116 mg/dL — ABNORMAL HIGH (ref 65–99)
Glucose-Capillary: 121 mg/dL — ABNORMAL HIGH (ref 65–99)
Glucose-Capillary: 127 mg/dL — ABNORMAL HIGH (ref 65–99)
Glucose-Capillary: 129 mg/dL — ABNORMAL HIGH (ref 65–99)
Glucose-Capillary: 133 mg/dL — ABNORMAL HIGH (ref 65–99)

## 2017-12-09 LAB — PHOSPHORUS: Phosphorus: 3.3 mg/dL (ref 2.5–4.6)

## 2017-12-09 LAB — POTASSIUM: POTASSIUM: 3.6 mmol/L (ref 3.5–5.1)

## 2017-12-09 MED ORDER — FAT EMULSION 20 % IV EMUL
250.0000 mL | INTRAVENOUS | Status: AC
  Administered 2017-12-09: 250 mL via INTRAVENOUS
  Filled 2017-12-09: qty 250

## 2017-12-09 MED ORDER — TRACE MINERALS CR-CU-MN-SE-ZN 10-1000-500-60 MCG/ML IV SOLN
INTRAVENOUS | Status: AC
  Administered 2017-12-09: 18:00:00 via INTRAVENOUS
  Filled 2017-12-09: qty 1992

## 2017-12-09 NOTE — Progress Notes (Signed)
SUBJECTIVE: Patient denies any chest pain or shortness of breath   Vitals:   12/08/17 1217 12/08/17 1951 12/09/17 0436 12/09/17 0500  BP: (!) 155/67 (!) 160/58 138/68   Pulse: 87 86 84   Resp: 16 20 (!) 21   Temp: 98 F (36.7 C) 98.2 F (36.8 C) 99.2 F (37.3 C)   TempSrc:  Oral Oral   SpO2: 94% 94% 95%   Weight:    217 lb 2 oz (98.5 kg)  Height:        Intake/Output Summary (Last 24 hours) at 12/09/2017 0847 Last data filed at 12/09/2017 3300 Gross per 24 hour  Intake 1183.86 ml  Output 3170 ml  Net -1986.14 ml    LABS: Basic Metabolic Panel: Recent Labs    12/07/17 0432 12/08/17 0439 12/09/17 0447  NA 137 136  --   K 4.7 3.6 3.6  CL 110 102  --   CO2 19* 27  --   GLUCOSE 163* 115*  --   BUN 11 18  --   CREATININE 0.58 0.53  --   CALCIUM 7.9* 8.1*  --   MG 1.7 1.8 1.8  PHOS 2.3* 2.7 3.3   Liver Function Tests: Recent Labs    12/08/17 0439  AST 44*  ALT 28  ALKPHOS 59  BILITOT 0.4  PROT 4.9*  ALBUMIN 2.0*   No results for input(s): LIPASE, AMYLASE in the last 72 hours. CBC: Recent Labs    12/08/17 0439 12/09/17 0447  WBC 14.6* 12.2*  NEUTROABS 10.8* 8.4*  HGB 11.0* 9.8*  HCT 33.3* 29.6*  MCV 88.6 88.9  PLT 313 331   Cardiac Enzymes: No results for input(s): CKTOTAL, CKMB, CKMBINDEX, TROPONINI in the last 72 hours. BNP: Invalid input(s): POCBNP D-Dimer: No results for input(s): DDIMER in the last 72 hours. Hemoglobin A1C: No results for input(s): HGBA1C in the last 72 hours. Fasting Lipid Panel: Recent Labs    12/08/17 0439  TRIG 201*   Thyroid Function Tests: No results for input(s): TSH, T4TOTAL, T3FREE, THYROIDAB in the last 72 hours.  Invalid input(s): FREET3 Anemia Panel: No results for input(s): VITAMINB12, FOLATE, FERRITIN, TIBC, IRON, RETICCTPCT in the last 72 hours.   PHYSICAL EXAM General: Well developed, well nourished, in no acute distress HEENT:  Normocephalic and atramatic Neck:  No JVD.  Lungs: Clear bilaterally  to auscultation and percussion. Heart: HRRR . Normal S1 and S2 without gallops or murmurs.  Abdomen: Bowel sounds are positive, abdomen soft and non-tender  Msk:  Back normal, normal gait. Normal strength and tone for age. Extremities: No clubbing, cyanosis or edema.   Neuro: Alert and oriented X 3. Psych:  Good affect, responds appropriately  TELEMETRY: Sinus rhythm  ASSESSMENT AND PLAN: Elevated troponin and status post right hemi-colectomy and doing very well postoperatively. Will do outpatient CTA coronaries upon discharge.  Active Problems:   [name]   Colonic mass   Lactic acidosis    Latoya Jones A, MD, [facility] 12/09/2017 8:47 AM

## 2017-12-09 NOTE — Plan of Care (Signed)
  Nutrition: Adequate nutrition will be maintained 12/09/2017 1534 - Progressing by Oris Drone, RN  Pt remains on TPN this shift Elimination: Will not experience complications related to urinary retention 12/09/2017 1534 - Progressing by Oris Drone, RN  Foley catheter discontinued at 1340 this shift; will monitor pt output Safety: Ability to remain free from injury will improve 12/09/2017 1534 - Progressing by Oris Drone, RN  Pt remains on High Fall Risks

## 2017-12-09 NOTE — Consult Note (Signed)
PHARMACY - ADULT TOTAL PARENTERAL NUTRITION CONSULT NOTE   Pharmacy Consult for TPN/electrolyte/glucose managment Indication: day 6 inadequate intake, cecal mass s/p right hemicolectomy with terminal ileum resection and separate small bowel resection  Patient Measurements: Height: 5\' 5"  (165.1 cm) Weight: 217 lb 2 oz (98.5 kg) IBW/kg (Calculated) : 57 TPN AdjBW (KG): 67.7 Body mass index is 36.13 kg/m. Usual Weight:   Assessment:   GI:  Endo:  Insulin requirements in the past 24 hours: 3 Lytes: k=3.6, Mg=1.8, phos=3.3 Renal:scr=0.53 Pulm: Cards:  Hepatobil: Neuro: [id number] 2g q24hr, metronidazole 500mg  q 8hr  TPN Access:1/16 TPN start date: 1/16 Nutritional Goals (per RD recommendation on 1/16): [id number] Protein: 95-115g Fluid:1.7-2L/day  Goal TPN rate is 83 ml/hr (Provides 1894 kcal, 100 grams of protein, 1992 mL fluid daily.)  Current Nutrition: NPO  Plan:  Continue Clinimix-E 5/15 at goal rate of 62ml/hr Lipids 20% 43ml/hr x 12 hr Add MVI, trace elements to TPN CBG and sensitive SSI q 6 hr and adjust as needed All electrolytes are WNL Monitor TPN labs, K, Mg, Phos daily for at least the first 3 days CBC w/ diff. prealbumin, LFT, CMP, TG weekly F/U tomorrow  Ramond Dial, Pharm.D, BCPS Clinical Pharmacist  12/09/2017,7:40 AM

## 2017-12-09 NOTE — Progress Notes (Signed)
[place] at [place] NAME: Latoya Jones    MR#:  [id number]  DATE OF BIRTH:  Nov 22, 1946  SUBJECTIVE:  CHIEF COMPLAINT:   Chief Complaint  Patient presents with  . Dizziness  Patient feeling much better today, no events overnight per nursing staff, family at the bedside, will discontinue Foley REVIEW OF SYSTEMS:  CONSTITUTIONAL: No fever, fatigue or weakness.  EYES: No blurred or double vision.  EARS, NOSE, AND THROAT: No tinnitus or ear pain.  RESPIRATORY: No cough, shortness of breath, wheezing or hemoptysis.  CARDIOVASCULAR: No chest pain, orthopnea, edema.  GASTROINTESTINAL: No nausea, vomiting, diarrhea or abdominal pain.  GENITOURINARY: No dysuria, hematuria.  ENDOCRINE: No polyuria, nocturia,  HEMATOLOGY: No anemia, easy bruising or bleeding SKIN: No rash or lesion. MUSCULOSKELETAL: No joint pain or arthritis.   NEUROLOGIC: No tingling, numbness, weakness.  PSYCHIATRY: No anxiety or depression.   ROS  DRUG ALLERGIES:  No Known Allergies  VITALS:  Blood pressure (!) 146/73, pulse 80, temperature 98.4 F (36.9 C), temperature source Oral, resp. rate 18, height 5\' 5"  (1.651 m), weight 98.5 kg (217 lb 2 oz), SpO2 95 %.  PHYSICAL EXAMINATION:  GENERAL:  72 y.o.-year-old patient lying in the bed with no acute distress.  EYES: Pupils equal, round, reactive to light and accommodation. No scleral icterus. Extraocular muscles intact.  HEENT: Head atraumatic, normocephalic. Oropharynx and nasopharynx clear.  NECK:  Supple, no jugular venous distention. No thyroid enlargement, no tenderness.  LUNGS: Normal breath sounds bilaterally, no wheezing, rales,rhonchi or crepitation. No use of accessory muscles of respiration.  CARDIOVASCULAR: S1, S2 normal. No murmurs, rubs, or gallops.  ABDOMEN: Soft, nontender, nondistended. Bowel sounds present. No organomegaly or mass.  EXTREMITIES: No pedal edema, cyanosis, or clubbing.  NEUROLOGIC:  Cranial nerves II through XII are intact. Muscle strength 5/5 in all extremities. Sensation intact. Gait not checked.  PSYCHIATRIC: The patient is alert and oriented x 3.  SKIN: No obvious rash, lesion, or ulcer.   Physical Exam LABORATORY PANEL:   CBC Recent Labs  Lab 12/09/17 0447  WBC 12.2*  HGB 9.8*  HCT 29.6*  PLT 331   ------------------------------------------------------------------------------------------------------------------  Chemistries  Recent Labs  Lab 12/08/17 0439 12/09/17 0447  NA 136  --   K 3.6 3.6  CL 102  --   CO2 27  --   GLUCOSE 115*  --   BUN 18  --   CREATININE 0.53  --   CALCIUM 8.1*  --   MG 1.8 1.8  AST 44*  --   ALT 28  --   ALKPHOS 59  --   BILITOT 0.4  --    ------------------------------------------------------------------------------------------------------------------  Cardiac Enzymes Recent Labs  Lab 12/03/17 1345 12/03/17 2026  TROPONINI 0.21* 0.16*   ------------------------------------------------------------------------------------------------------------------  RADIOLOGY:  No results found.  ASSESSMENT AND PLAN:  1acute sepsis Resolved Secondary to acute large cecal mass with associatedacuteappendicitis Treated on our sepsis protocol with empiric Rocephin/Flagyl for 7-day course   2acute appendicitis Resolving Continue empiric antibiotics for 7-day course S/p Expl Lap Rt. HC w/ JP drain placement 12/06/17 Encourage incentive spirometer, increase activity, discontinue Foley  3acute newly diagnosed cecal mass S/P resection as stated above  To follow-up with oncology s/p discharge for re-evaluation/pathology results will be shared at that time  4acute kidney injury Resolved with IV fluids for rehydration  5chronic schizophrenia Stable Continue home psychotropic regiment  6chronic morbid obesity Most likely secondary to excess calories Lifestyle modification recommended  7elevated  troponins Most likely secondary to sepsis/demand ischemia Echocardiogram noted for mild diastolic dysfunction cardiology input appreciated-will follow-up with cardiology s/pdischarge for continued outpatient management/evaluationCTA of coronaries  All the records are reviewed and case discussed with Care Management/Social Workerr. Management plans discussed with the patient, family and they are in agreement.  CODE STATUS: full  TOTAL TIME TAKING CARE OF THIS PATIENT: 45 minutes.     POSSIBLE D/C IN 3-5 DAYS, DEPENDING ON CLINICAL CONDITION.   Avel Peace Salary M.D on 12/09/2017   Between 7am to 6pm - Pager - [phone]  After 6pm go to [email or web address] - password [email or web address] [place]  Office  [phone]  CC: Primary care physician; Randel Pigg, MD  Note: This dictation was prepared with Dragon dictation along with smaller phrase technology. Any transcriptional errors that result from this process are unintentional.

## 2017-12-09 NOTE — Progress Notes (Signed)
3 Days Post-Op  Subjective: Status post right colon resection and small bowel resection.  She does not think sure that she has passed any gas yet but she feels well and has no nausea or vomiting.  Objective: Vital signs in last 24 hours: Temp:  [98 F (36.7 C)-99.2 F (37.3 C)] 99.2 F (37.3 C) (01/18 0436) Pulse Rate:  [84-87] 84 (01/18 0436) Resp:  [16-21] 21 (01/18 0436) BP: (138-160)/(58-68) 138/68 (01/18 0436) SpO2:  [94 %-95 %] 95 % (01/18 0436) Weight:  [217 lb 2 oz (98.5 kg)] 217 lb 2 oz (98.5 kg) (01/18 0500) Last BM Date: 12/06/17  Intake/Output from previous day: 01/17 0701 - 01/18 0700 In: 1183.9 [I.V.:933.9; IV Piggyback:250] Out: 8315 [Urine:1300; Emesis/NG output:1700; Drains:170] Intake/Output this shift: Total I/O In: -  Out: 475 [Urine:450; Drains:25]  Physical exam:  Wound is clean no erythema no drainage abdomen is soft and there are drain is serous only.  Lab Results: CBC  Recent Labs    12/08/17 0439 12/09/17 0447  WBC 14.6* 12.2*  HGB 11.0* 9.8*  HCT 33.3* 29.6*  PLT 313 331   BMET Recent Labs    12/07/17 0432 12/08/17 0439 12/09/17 0447  NA 137 136  --   K 4.7 3.6 3.6  CL 110 102  --   CO2 19* 27  --   GLUCOSE 163* 115*  --   BUN 11 18  --   CREATININE 0.58 0.53  --   CALCIUM 7.9* 8.1*  --    PT/INR No results for input(s): LABPROT, INR in the last 72 hours. ABG No results for input(s): PHART, HCO3 in the last 72 hours.  Invalid input(s): PCO2, PO2  Studies/Results: No results found.  Anti-infectives: Anti-infectives (From admission, onward)   Start     Dose/Rate Route Frequency Ordered Stop   12/04/17 1000  cefTRIAXone (ROCEPHIN) 2 g in dextrose 5 % 50 mL IVPB     2 g 100 mL/hr over 30 Minutes Intravenous Every 24 hours 12/03/17 0704     12/03/17 0900  metroNIDAZOLE (FLAGYL) IVPB 500 mg     500 mg 100 mL/hr over 60 Minutes Intravenous Every 8 hours 12/03/17 0847     12/03/17 0700  cefTRIAXone (ROCEPHIN) 2 g in  dextrose 5 % 50 mL IVPB     2 g 100 mL/hr over 30 Minutes Intravenous  Once 12/03/17 0650 12/03/17 0752   12/03/17 0700  azithromycin (ZITHROMAX) 500 mg in dextrose 5 % 250 mL IVPB     500 mg 250 mL/hr over 60 Minutes Intravenous  Once 12/03/17 0650 12/03/17 0836      Assessment/Plan: s/p Procedure(s): COLON RESECTION RIGHT   Patient is doing quite well awaiting bowel function at this time before nasogastric tube can be removed.  Florene Glen, MD, FACS  12/09/2017

## 2017-12-10 LAB — CBC WITH DIFFERENTIAL/PLATELET
BASOS ABS: 0.1 10*3/uL (ref 0–0.1)
Basophils Relative: 1 %
EOS PCT: 2 %
Eosinophils Absolute: 0.3 10*3/uL (ref 0–0.7)
HCT: 32 % — ABNORMAL LOW (ref 35.0–47.0)
Hemoglobin: 10 g/dL — ABNORMAL LOW (ref 12.0–16.0)
LYMPHS PCT: 15 %
Lymphs Abs: 2 10*3/uL (ref 1.0–3.6)
MCH: 29.2 pg (ref 26.0–34.0)
MCHC: 31.2 g/dL — ABNORMAL LOW (ref 32.0–36.0)
MCV: 93.5 fL (ref 80.0–100.0)
Monocytes Absolute: 1.4 10*3/uL — ABNORMAL HIGH (ref 0.2–0.9)
Monocytes Relative: 11 %
Neutro Abs: 9.5 10*3/uL — ABNORMAL HIGH (ref 1.4–6.5)
Neutrophils Relative %: 71 %
PLATELETS: 404 10*3/uL (ref 150–440)
RBC: 3.43 MIL/uL — AB (ref 3.80–5.20)
RDW: 13.9 % (ref 11.5–14.5)
WBC: 13.2 10*3/uL — AB (ref 3.6–11.0)

## 2017-12-10 LAB — BASIC METABOLIC PANEL
Anion gap: 7 (ref 5–15)
BUN: 17 mg/dL (ref 6–20)
CALCIUM: 8.2 mg/dL — AB (ref 8.9–10.3)
CO2: 27 mmol/L (ref 22–32)
Chloride: 99 mmol/L — ABNORMAL LOW (ref 101–111)
Creatinine, Ser: 0.5 mg/dL (ref 0.44–1.00)
GFR calc Af Amer: >60 mL/min (ref >60)
Glucose, Bld: 126 mg/dL — ABNORMAL HIGH (ref 65–99)
Potassium: 4 mmol/L (ref 3.5–5.1)
Sodium: 133 mmol/L — ABNORMAL LOW (ref 135–145)

## 2017-12-10 LAB — GLUCOSE, CAPILLARY
Glucose-Capillary: 135 mg/dL — ABNORMAL HIGH (ref 65–99)
Glucose-Capillary: 134 mg/dL — ABNORMAL HIGH (ref 65–99)
Glucose-Capillary: 113 mg/dL — ABNORMAL HIGH (ref 65–99)

## 2017-12-10 LAB — PHOSPHORUS
PHOSPHORUS: 7.8 mg/dL — AB (ref 2.5–4.6)
Phosphorus: 3.2 mg/dL (ref 2.5–4.6)

## 2017-12-10 LAB — POTASSIUM: POTASSIUM: 6.7 mmol/L — AB (ref 3.5–5.1)

## 2017-12-10 LAB — MAGNESIUM
MAGNESIUM: 2.3 mg/dL (ref 1.7–2.4)
Magnesium: 1.9 mg/dL (ref 1.7–2.4)

## 2017-12-10 MED ORDER — FAT EMULSION 20 % IV EMUL
250.0000 mL | INTRAVENOUS | Status: DC
  Administered 2017-12-10: 18:00:00 250 mL via INTRAVENOUS
  Filled 2017-12-10: qty 250

## 2017-12-10 MED ORDER — DEXTROSE 50 % IV SOLN: 1.0000 | Freq: Once | INTRAVENOUS | Status: DC

## 2017-12-10 MED ORDER — TRACE MINERALS CR-CU-MN-SE-ZN 10-1000-500-60 MCG/ML IV SOLN
INTRAVENOUS | Status: DC
  Administered 2017-12-10: 18:00:00 via INTRAVENOUS
  Filled 2017-12-10: qty 1992

## 2017-12-10 MED ORDER — INSULIN ASPART 100 UNIT/ML IV SOLN
10.0000 [IU] | Freq: Once | INTRAVENOUS | Status: DC
  Filled 2017-12-10: qty 0.1

## 2017-12-10 MED ORDER — SODIUM CHLORIDE 0.9 % IV SOLN: 1.0000 g | Freq: Once | INTRAVENOUS | Status: DC

## 2017-12-10 NOTE — Consult Note (Signed)
PHARMACY - ADULT TOTAL PARENTERAL NUTRITION CONSULT NOTE   Pharmacy Consult for TPN/electrolyte/glucose managment Indication: day 7 inadequate intake, cecal mass s/p right hemicolectomy with terminal ileum resection and separate small bowel resection  Patient Measurements: Height: 5\' 5"  (165.1 cm) Weight: 212 lb 5 oz (96.3 kg) IBW/kg (Calculated) : 57 TPN AdjBW (KG): 67.7 Body mass index is 35.33 kg/m. Usual Weight:   Assessment:   GI:  Endo:  Insulin requirements in the past 24 hours: 3 Lytes: k=3.6, Mg=1.8, phos=3.3 Renal:scr=0.53 Pulm: Cards:  Hepatobil: Neuro: QQ:[id number] 2g q24hr, metronidazole 500mg  q 8hr  TPN Access:1/16 TPN start date: 1/16 Nutritional Goals (per RD recommendation on 1/16): [id number] Protein: 95-115g Fluid:1.7-2L/day  Goal TPN rate is 83 ml/hr (Provides 1894 kcal, 100 grams of protein, 1992 mL fluid daily.)  Current Nutrition: NPO  Plan:  Continue Clinimix-E 5/15 at goal rate of 65ml/hr Lipids 20% 68ml/hr x 12 hr Add MVI, trace elements to TPN CBG and sensitive SSI q 6 hr and adjust as needed All electrolytes are WNL Monitor TPN labs, K, Mg, Phos daily for at least the first 3 days CBC w/ diff. prealbumin, LFT, CMP, TG weekly F/U tomorrow  Larene Beach, PharmD  Clinical Pharmacist  12/10/2017,11:09 AM

## 2017-12-10 NOTE — Progress Notes (Signed)
[facility] [place] [facility] [place] NAME: Latoya Jones    MR#:  [id number]  DATE OF BIRTH:  May 25, 1946  SUBJECTIVE:  CHIEF COMPLAINT:   Chief Complaint  Patient presents with  . Dizziness  Patient without complaint, no events overnight per nursing staff, family at bedside, cardiology/general surgery notes reviewed  REVIEW OF SYSTEMS:  CONSTITUTIONAL: No fever, fatigue or weakness.  EYES: No blurred or double vision.  EARS, NOSE, AND THROAT: No tinnitus or ear pain.  RESPIRATORY: No cough, shortness of breath, wheezing or hemoptysis.  CARDIOVASCULAR: No chest pain, orthopnea, edema.  GASTROINTESTINAL: No nausea, vomiting, diarrhea or abdominal pain.  GENITOURINARY: No dysuria, hematuria.  ENDOCRINE: No polyuria, nocturia,  HEMATOLOGY: No anemia, easy bruising or bleeding SKIN: No rash or lesion. MUSCULOSKELETAL: No joint pain or arthritis.   NEUROLOGIC: No tingling, numbness, weakness.  PSYCHIATRY: No anxiety or depression.   ROS  DRUG ALLERGIES:  No Known Allergies  VITALS:  Blood pressure 140/60, pulse 83, temperature 98.5 F (36.9 C), temperature source Oral, resp. rate 18, height 5\' 5"  (1.651 m), weight 96.3 kg (212 lb 5 oz), SpO2 93 %.  PHYSICAL EXAMINATION:  GENERAL:  72 y.o.-year-old patient lying in the bed with no acute distress.  EYES: Pupils equal, round, reactive to light and accommodation. No scleral icterus. Extraocular muscles intact.  HEENT: Head atraumatic, normocephalic. Oropharynx and nasopharynx clear.  NECK:  Supple, no jugular venous distention. No thyroid enlargement, no tenderness.  LUNGS: Normal breath sounds bilaterally, no wheezing, rales,rhonchi or crepitation. No use of accessory muscles of respiration.  CARDIOVASCULAR: S1, S2 normal. No murmurs, rubs, or gallops.  ABDOMEN: Soft, nontender, nondistended. Bowel sounds present. No organomegaly or mass.  EXTREMITIES: No pedal edema, cyanosis, or clubbing.   NEUROLOGIC: Cranial nerves II through XII are intact. Muscle strength 5/5 in all extremities. Sensation intact. Gait not checked.  PSYCHIATRIC: The patient is alert and oriented x 3.  SKIN: No obvious rash, lesion, or ulcer.   Physical Exam LABORATORY PANEL:   CBC Recent Labs  Lab 12/10/17 0602  WBC 13.2*  HGB 10.0*  HCT 32.0*  PLT 404   ------------------------------------------------------------------------------------------------------------------  Chemistries  Recent Labs  Lab 12/08/17 0439  12/10/17 0715  NA 136  --  133*  K 3.6   < > 4.0  CL 102  --  99*  CO2 27  --  27  GLUCOSE 115*  --  126*  BUN 18  --  17  CREATININE 0.53  --  0.50  CALCIUM 8.1*  --  8.2*  MG 1.8   < > 1.9  AST 44*  --   --   ALT 28  --   --   ALKPHOS 59  --   --   BILITOT 0.4  --   --    < > = values in this interval not displayed.   ------------------------------------------------------------------------------------------------------------------  Cardiac Enzymes Recent Labs  Lab 12/03/17 1345 12/03/17 2026  TROPONINI 0.21* 0.16*   ------------------------------------------------------------------------------------------------------------------  RADIOLOGY:  No results found.  ASSESSMENT AND PLAN:  1acute sepsis Resolved Secondary to acute large cecal mass with associatedacuteappendicitis Treated on our sepsis protocol with empiric Rocephin/Flagyl for 7-day course   2acute appendicitis Resolved Treated with empiric course of antibiotics per above for 7 days  S/p Expl Lap Rt. HC w/ JP drain placement 12/06/17 Continue incentive spirometer, increase activity, NG tube to be clamped today, clear liquid started  3acute newly diagnosed cecal mass S/Presection as stated above  To follow-up with oncology s/pdischarge for re-evaluation/pathology results will be shared at that time  4acute kidney injury Resolved with IV fluids for rehydration  5chronic  schizophrenia Stable Continue home psychotropic regiment  6chronic morbid obesity Most likely secondary to excess calories Lifestyle modification recommended  7elevated troponins Most likely secondary to sepsis/demand ischemia Echocardiogram noted for mild diastolic dysfunction cardiology input appreciated-will follow-up with cardiology s/pdischarge for continued outpatient management/evaluationCTA ofcoronaries  All the records are reviewed and case discussed with Care Management/Social Workerr. Management plans discussed with the patient, family and they are in agreement.  CODE STATUS: full  TOTAL TIME TAKING CARE OF THIS PATIENT: 35 minutes.     POSSIBLE D/C IN 2-5 DAYS, DEPENDING ON CLINICAL CONDITION.   Avel Peace Emy Angevine M.D on 12/10/2017   Between 7am to 6pm - Pager - [phone]  After 6pm go to [email or web address] - password [email or web address] [facility]  Office  [phone]  CC: Primary care physician; Randel Pigg, MD  Note: This dictation was prepared with Dragon dictation along with smaller phrase technology. Any transcriptional errors that result from this process are unintentional.

## 2017-12-10 NOTE — Progress Notes (Signed)
4 Days Post-Op  Subjective: Patient does not know if she is passing any gas following a right hemicolectomy.  She has minimal pain no nausea or vomiting.  She is not sure if she has been passing gas.  Objective: Vital signs in last 24 hours: Temp:  [98.4 F (36.9 C)-98.5 F (36.9 C)] 98.5 F (36.9 C) (01/19 0357) Pulse Rate:  [80-86] 83 (01/19 0357) Resp:  [18-20] 18 (01/19 0357) BP: (140-147)/(60-81) 140/60 (01/19 0357) SpO2:  [93 %-95 %] 93 % (01/19 0357) Weight:  [212 lb 5 oz (96.3 kg)] 212 lb 5 oz (96.3 kg) (01/19 0357) Last BM Date: 12/06/17  Intake/Output from previous day: 01/18 0701 - 01/19 0700 In: 4274.5 [I.V.:4124.5; IV Piggyback:150] Out: 2241 [Urine:451; Emesis/NG output:1675; Drains:115] Intake/Output this shift: No intake/output data recorded.  Physical exam:  Abdomen is soft minimally tender around the incision no erythema no drainage drain output is serosanguineous.  Lab Results: CBC  Recent Labs    12/09/17 0447 12/10/17 0602  WBC 12.2* 13.2*  HGB 9.8* 10.0*  HCT 29.6* 32.0*  PLT 331 404   BMET Recent Labs    12/08/17 0439  12/10/17 0602 12/10/17 0715  NA 136  --   --  133*  K 3.6   < > 6.7* 4.0  CL 102  --   --  99*  CO2 27  --   --  27  GLUCOSE 115*  --   --  126*  BUN 18  --   --  17  CREATININE 0.53  --   --  0.50  CALCIUM 8.1*  --   --  8.2*   < > = values in this interval not displayed.   PT/INR No results for input(s): LABPROT, INR in the last 72 hours. ABG No results for input(s): PHART, HCO3 in the last 72 hours.  Invalid input(s): PCO2, PO2  Studies/Results: No results found.  Anti-infectives: Anti-infectives (From admission, onward)   Start     Dose/Rate Route Frequency Ordered Stop   12/04/17 1000  cefTRIAXone (ROCEPHIN) 2 g in dextrose 5 % 50 mL IVPB  Status:  Discontinued     2 g 100 mL/hr over 30 Minutes Intravenous Every 24 hours 12/03/17 0704 12/09/17 1411   12/03/17 0900  metroNIDAZOLE (FLAGYL) IVPB 500 mg   Status:  Discontinued     500 mg 100 mL/hr over 60 Minutes Intravenous Every 8 hours 12/03/17 0847 12/09/17 1411   12/03/17 0700  cefTRIAXone (ROCEPHIN) 2 g in dextrose 5 % 50 mL IVPB     2 g 100 mL/hr over 30 Minutes Intravenous  Once 12/03/17 0650 12/03/17 0752   12/03/17 0700  azithromycin (ZITHROMAX) 500 mg in dextrose 5 % 250 mL IVPB     500 mg 250 mL/hr over 60 Minutes Intravenous  Once 12/03/17 0650 12/03/17 0836      Assessment/Plan: s/p Procedure(s): COLON RESECTION RIGHT   We will clamp NG tube and give clear liquids in anticipation of improved bowel function.  It is not clear if she is passing gas as she cannot give a reliable answer.  Florene Glen, MD, FACS  12/10/2017

## 2017-12-10 NOTE — Plan of Care (Signed)
  Progressing Education: Knowledge of General Education information will improve 12/10/2017 1302 - Progressing by Cherylann Parr, RN Health Behavior/Discharge Planning: Ability to manage health-related needs will improve 12/10/2017 1302 - Progressing by Cherylann Parr, RN Clinical Measurements: Ability to maintain clinical measurements within normal limits will improve 12/10/2017 1302 - Progressing by Cherylann Parr, RN Will remain free from infection 12/10/2017 1302 - Progressing by Cherylann Parr, RN Diagnostic test results will improve 12/10/2017 1302 - Progressing by Cherylann Parr, RN Respiratory complications will improve 12/10/2017 1302 - Progressing by Cherylann Parr, RN Cardiovascular complication will be avoided 12/10/2017 1302 - Progressing by Cherylann Parr, RN Activity: Risk for activity intolerance will decrease 12/10/2017 1302 - Progressing by Cherylann Parr, RN Nutrition: Adequate nutrition will be maintained 12/10/2017 1302 - Progressing by Cherylann Parr, RN Coping: Level of anxiety will decrease 12/10/2017 1302 - Progressing by Cherylann Parr, RN Elimination: Will not experience complications related to bowel motility 12/10/2017 1302 - Progressing by Cherylann Parr, RN Will not experience complications related to urinary retention 12/10/2017 1302 - Progressing by Cherylann Parr, RN Pain Managment: General experience of comfort will improve 12/10/2017 1302 - Progressing by Cherylann Parr, RN Safety: Ability to remain free from injury will improve 12/10/2017 1302 - Progressing by Cherylann Parr, RN Skin Integrity: Risk for impaired skin integrity will decrease 12/10/2017 1302 - Progressing by Cherylann Parr, RN Spiritual Needs Ability to function at adequate level 12/10/2017 1302 - Progressing by Cherylann Parr, RN Education: Knowledge of General Education information will improve 12/10/2017 1302 - Progressing by Cherylann Parr, RN Health  Behavior/Discharge Planning: Ability to manage health-related needs will improve 12/10/2017 1302 - Progressing by Cherylann Parr, RN Clinical Measurements: Ability to maintain clinical measurements within normal limits will improve 12/10/2017 1302 - Progressing by Cherylann Parr, RN Will remain free from infection 12/10/2017 1302 - Progressing by Cherylann Parr, RN Diagnostic test results will improve 12/10/2017 1302 - Progressing by Cherylann Parr, RN Respiratory complications will improve 12/10/2017 1302 - Progressing by Cherylann Parr, RN Cardiovascular complication will be avoided 12/10/2017 1302 - Progressing by Cherylann Parr, RN Activity: Risk for activity intolerance will decrease 12/10/2017 1302 - Progressing by Cherylann Parr, RN Nutrition: Adequate nutrition will be maintained 12/10/2017 1302 - Progressing by Cherylann Parr, RN Coping: Level of anxiety will decrease 12/10/2017 1302 - Progressing by Cherylann Parr, RN Elimination: Will not experience complications related to bowel motility 12/10/2017 1302 - Progressing by Cherylann Parr, RN Will not experience complications related to urinary retention 12/10/2017 1302 - Progressing by Cherylann Parr, RN Pain Managment: General experience of comfort will improve 12/10/2017 1302 - Progressing by Cherylann Parr, RN Safety: Ability to remain free from injury will improve 12/10/2017 1302 - Progressing by Cherylann Parr, RN Skin Integrity: Risk for impaired skin integrity will decrease 12/10/2017 1302 - Progressing by Cherylann Parr, RN

## 2017-12-10 NOTE — Plan of Care (Signed)
VSS, free of falls during shift.  Reported back pain 5/10, received PRN IV Morphine 2mg  x1.  No other needs overnight.  Family at bedside, call bell within reach.  WCTM.

## 2017-12-10 NOTE — Progress Notes (Signed)
SUBJECTIVE: Patient denies any chest pain or shortness of breath to   Vitals:   12/09/17 0500 12/09/17 1256 12/09/17 2009 12/10/17 0357  BP:  (!) 146/73 (!) 147/81 140/60  Pulse:  80 86 83  Resp:  18 20 18   Temp:  98.4 F (36.9 C) 98.4 F (36.9 C) 98.5 F (36.9 C)  TempSrc:  Oral Oral Oral  SpO2:  95% 95% 93%  Weight: 217 lb 2 oz (98.5 kg)   212 lb 5 oz (96.3 kg)  Height:        Intake/Output Summary (Last 24 hours) at 12/10/2017 1104 Last data filed at 12/10/2017 0700 Gross per 24 hour  Intake 4224.49 ml  Output 1766 ml  Net 2458.49 ml    LABS: Basic Metabolic Panel: Recent Labs    12/08/17 0439  12/10/17 0602 12/10/17 0715  NA 136  --   --  133*  K 3.6   < > 6.7* 4.0  CL 102  --   --  99*  CO2 27  --   --  27  GLUCOSE 115*  --   --  126*  BUN 18  --   --  17  CREATININE 0.53  --   --  0.50  CALCIUM 8.1*  --   --  8.2*  MG 1.8   < > 2.3 1.9  PHOS 2.7   < > 7.8* 3.2   < > = values in this interval not displayed.   Liver Function Tests: Recent Labs    12/08/17 0439  AST 44*  ALT 28  ALKPHOS 59  BILITOT 0.4  PROT 4.9*  ALBUMIN 2.0*   No results for input(s): LIPASE, AMYLASE in the last 72 hours. CBC: Recent Labs    12/09/17 0447 12/10/17 0602  WBC 12.2* 13.2*  NEUTROABS 8.4* 9.5*  HGB 9.8* 10.0*  HCT 29.6* 32.0*  MCV 88.9 93.5  PLT 331 404   Cardiac Enzymes: No results for input(s): CKTOTAL, CKMB, CKMBINDEX, TROPONINI in the last 72 hours. BNP: Invalid input(s): POCBNP D-Dimer: No results for input(s): DDIMER in the last 72 hours. Hemoglobin A1C: No results for input(s): HGBA1C in the last 72 hours. Fasting Lipid Panel: Recent Labs    12/08/17 0439  TRIG 201*   Thyroid Function Tests: No results for input(s): TSH, T4TOTAL, T3FREE, THYROIDAB in the last 72 hours.  Invalid input(s): FREET3 Anemia Panel: No results for input(s): VITAMINB12, FOLATE, FERRITIN, TIBC, IRON, RETICCTPCT in the last 72 hours.   PHYSICAL EXAM General: Well  developed, well nourished, in no acute distress HEENT:  Normocephalic and atramatic Neck:  No JVD.  Lungs: Clear bilaterally to auscultation and percussion. Heart: HRRR . Normal S1 and S2 without gallops or murmurs.  Abdomen: Bowel sounds are positive, abdomen soft and non-tender  Msk:  Back normal, normal gait. Normal strength and tone for age. Extremities: No clubbing, cyanosis or edema.   Neuro: Alert and oriented X 3. Psych:  Good affect, responds appropriately  TELEMETRY: Sinus rhythm ASSESSMENT AND PLAN: Mildly elevated troponin and status post right hemic colectomy recovering postoperatively without any complications.  Active Problems:   Sepsis ([place])   Colonic mass   Lactic acidosis    Tashai Catino A, MD, [facility] 12/10/2017 11:04 AM    2

## 2017-12-11 LAB — CBC WITH DIFFERENTIAL/PLATELET
BASOS PCT: 0 %
Basophils Absolute: 0.1 10*3/uL (ref 0–0.1)
Eosinophils Absolute: 0.2 10*3/uL (ref 0–0.7)
Eosinophils Relative: 2 %
HCT: 28.1 % — ABNORMAL LOW (ref 35.0–47.0)
HEMOGLOBIN: 9.4 g/dL — AB (ref 12.0–16.0)
LYMPHS ABS: 1.5 10*3/uL (ref 1.0–3.6)
Lymphocytes Relative: 13 %
MCH: 29.7 pg (ref 26.0–34.0)
MCHC: 33.5 g/dL (ref 32.0–36.0)
MCV: 88.6 fL (ref 80.0–100.0)
MONOS PCT: 11 %
Monocytes Absolute: 1.3 10*3/uL — ABNORMAL HIGH (ref 0.2–0.9)
NEUTROS ABS: 8.7 10*3/uL — AB (ref 1.4–6.5)
NEUTROS PCT: 74 %
Platelets: 364 10*3/uL (ref 150–440)
RBC: 3.18 MIL/uL — AB (ref 3.80–5.20)
RDW: 13.3 % (ref 11.5–14.5)
WBC: 11.7 10*3/uL — ABNORMAL HIGH (ref 3.6–11.0)

## 2017-12-11 LAB — GLUCOSE, CAPILLARY
Glucose-Capillary: 107 mg/dL — ABNORMAL HIGH (ref 65–99)
Glucose-Capillary: 118 mg/dL — ABNORMAL HIGH (ref 65–99)
Glucose-Capillary: 123 mg/dL — ABNORMAL HIGH (ref 65–99)
Glucose-Capillary: 134 mg/dL — ABNORMAL HIGH (ref 65–99)

## 2017-12-11 LAB — BASIC METABOLIC PANEL
Anion gap: 6 (ref 5–15)
BUN: 15 mg/dL (ref 6–20)
CHLORIDE: 105 mmol/L (ref 101–111)
CO2: 24 mmol/L (ref 22–32)
CREATININE: 0.55 mg/dL (ref 0.44–1.00)
Calcium: 8.2 mg/dL — ABNORMAL LOW (ref 8.9–10.3)
GFR calc non Af Amer: >60 mL/min (ref >60)
Glucose, Bld: 115 mg/dL — ABNORMAL HIGH (ref 65–99)
POTASSIUM: 3.8 mmol/L (ref 3.5–5.1)
SODIUM: 135 mmol/L (ref 135–145)

## 2017-12-11 LAB — MAGNESIUM: MAGNESIUM: 1.9 mg/dL (ref 1.7–2.4)

## 2017-12-11 MED ORDER — ACETAMINOPHEN 325 MG PO TABS
650.0000 mg | ORAL_TABLET | Freq: Four times a day (QID) | ORAL | Status: DC | PRN
  Administered 2017-12-11: 15:00:00 650 mg via ORAL
  Filled 2017-12-11: qty 2

## 2017-12-11 NOTE — Plan of Care (Signed)
  Progressing Education: Knowledge of General Education information will improve 12/11/2017 1303 - Progressing by Cherylann Parr, RN Health Behavior/Discharge Planning: Ability to manage health-related needs will improve 12/11/2017 1303 - Progressing by Cherylann Parr, RN Clinical Measurements: Ability to maintain clinical measurements within normal limits will improve 12/11/2017 1303 - Progressing by Cherylann Parr, RN Will remain free from infection 12/11/2017 1303 - Progressing by Cherylann Parr, RN Diagnostic test results will improve 12/11/2017 1303 - Progressing by Cherylann Parr, RN Respiratory complications will improve 12/11/2017 1303 - Progressing by Cherylann Parr, RN Cardiovascular complication will be avoided 12/11/2017 1303 - Progressing by Cherylann Parr, RN Activity: Risk for activity intolerance will decrease 12/11/2017 1303 - Progressing by Cherylann Parr, RN Nutrition: Adequate nutrition will be maintained 12/11/2017 1303 - Progressing by Cherylann Parr, RN Coping: Level of anxiety will decrease 12/11/2017 1303 - Progressing by Cherylann Parr, RN Elimination: Will not experience complications related to bowel motility 12/11/2017 1303 - Progressing by Cherylann Parr, RN Will not experience complications related to urinary retention 12/11/2017 1303 - Progressing by Cherylann Parr, RN Pain Managment: General experience of comfort will improve 12/11/2017 1303 - Progressing by Cherylann Parr, RN Safety: Ability to remain free from injury will improve 12/11/2017 1303 - Progressing by Cherylann Parr, RN Skin Integrity: Risk for impaired skin integrity will decrease 12/11/2017 1303 - Progressing by Cherylann Parr, RN Spiritual Needs Ability to function at adequate level 12/11/2017 1303 - Progressing by Cherylann Parr, RN Education: Knowledge of General Education information will improve 12/11/2017 1303 - Progressing by Cherylann Parr, RN Health  Behavior/Discharge Planning: Ability to manage health-related needs will improve 12/11/2017 1303 - Progressing by Cherylann Parr, RN Clinical Measurements: Ability to maintain clinical measurements within normal limits will improve 12/11/2017 1303 - Progressing by Cherylann Parr, RN Will remain free from infection 12/11/2017 1303 - Progressing by Cherylann Parr, RN Diagnostic test results will improve 12/11/2017 1303 - Progressing by Cherylann Parr, RN Respiratory complications will improve 12/11/2017 1303 - Progressing by Cherylann Parr, RN Cardiovascular complication will be avoided 12/11/2017 1303 - Progressing by Cherylann Parr, RN Activity: Risk for activity intolerance will decrease 12/11/2017 1303 - Progressing by Cherylann Parr, RN Nutrition: Adequate nutrition will be maintained 12/11/2017 1303 - Progressing by Cherylann Parr, RN Coping: Level of anxiety will decrease 12/11/2017 1303 - Progressing by Cherylann Parr, RN Elimination: Will not experience complications related to bowel motility 12/11/2017 1303 - Progressing by Cherylann Parr, RN Will not experience complications related to urinary retention 12/11/2017 1303 - Progressing by Cherylann Parr, RN Pain Managment: General experience of comfort will improve 12/11/2017 1303 - Progressing by Cherylann Parr, RN Safety: Ability to remain free from injury will improve 12/11/2017 1303 - Progressing by Cherylann Parr, RN Skin Integrity: Risk for impaired skin integrity will decrease 12/11/2017 1303 - Progressing by Cherylann Parr, RN

## 2017-12-11 NOTE — Progress Notes (Signed)
5 Days Post-Op  Subjective: Status post right colon and small bowel resection for likely cancer.  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  There is residual tumor in the retroperitoneum as well.  Patient feels well today and wants to try something other than liquids.  Passing gas.  Objective: Vital signs in last 24 hours: Temp:  [98.2 F (36.8 C)-98.5 F (36.9 C)] 98.2 F (36.8 C) (01/20 0523) Pulse Rate:  [74-91] 91 (01/20 0523) Resp:  [18-23] 23 (01/20 0523) BP: (117-148)/(53-65) 132/53 (01/20 0523) SpO2:  [96 %-97 %] 97 % (01/20 0523) Weight:  [204 lb 9 oz (92.8 kg)] 204 lb 9 oz (92.8 kg) (01/20 0523) Last BM Date: 12/10/17  Intake/Output from previous day: 01/19 0701 - 01/20 0700 In: 1309.5 [P.O.:240; I.V.:1069.5] Out: 315 [Drains:315] Intake/Output this shift: Total I/O In: 240 [P.O.:240] Out: 50 [Drains:50]  Physical exam:  Wound is clean no erythema no drainage soft nontender abdomen JP drain in place with serous fluid only.  Lab Results: CBC  Recent Labs    12/10/17 0602 12/11/17 0616  WBC 13.2* 11.7*  HGB 10.0* 9.4*  HCT 32.0* 28.1*  PLT 404 364   BMET Recent Labs    12/10/17 0715 12/11/17 0616  NA 133* 135  K 4.0 3.8  CL 99* 105  CO2 27 24  GLUCOSE 126* 115*  BUN 17 15  CREATININE 0.50 0.55  CALCIUM 8.2* 8.2*   PT/INR No results for input(s): LABPROT, INR in the last 72 hours. ABG No results for input(s): PHART, HCO3 in the last 72 hours.  Invalid input(s): PCO2, PO2  Studies/Results: No results found.  Anti-infectives: Anti-infectives (From admission, onward)   Start     Dose/Rate Route Frequency Ordered Stop   12/04/17 1000  cefTRIAXone (ROCEPHIN) 2 g in dextrose 5 % 50 mL IVPB  Status:  Discontinued     2 g 100 mL/hr over 30 Minutes Intravenous Every 24 hours 12/03/17 0704  12/09/17 1411   12/03/17 0900  metroNIDAZOLE (FLAGYL) IVPB 500 mg  Status:  Discontinued     500 mg 100 mL/hr over 60 Minutes Intravenous Every 8 hours 12/03/17 0847 12/09/17 1411   12/03/17 0700  cefTRIAXone (ROCEPHIN) 2 g in dextrose 5 % 50 mL IVPB     2 g 100 mL/hr over 30 Minutes Intravenous  Once 12/03/17 0650 12/03/17 0752   12/03/17 0700  azithromycin (ZITHROMAX) 500 mg in dextrose 5 % 250 mL IVPB     500 mg 250 mL/hr over 60 Minutes Intravenous  Once 12/03/17 0650 12/03/17 0836      Assessment/Plan: s/p Procedure(s): COLON RESECTION RIGHT   White blood cell count is improved but still slightly elevated.  Patient doing quite well I discussed the pathology with her oncology is already seeing.  Also discussed with Dr. Chancy Milroy from cardiology. Will advance diet today.  Drain can be removed prior to discharge.  Suspect patient would benefit from rehab facility.  Florene Glen, MD, FACS  12/11/2017

## 2017-12-11 NOTE — Progress Notes (Addendum)
[facility] [place] NAME: Jawanna Dykman    MR#:  [id number]  DATE OF BIRTH:  03-Dec-1945  SUBJECTIVE:  CHIEF COMPLAINT:   Chief Complaint  Patient presents with  . Dizziness  Patient without complaint, tolerating clear liquid diet, no events overnight per nursing staff  REVIEW OF SYSTEMS:  CONSTITUTIONAL: No fever, fatigue or weakness.  EYES: No blurred or double vision.  EARS, NOSE, AND THROAT: No tinnitus or ear pain.  RESPIRATORY: No cough, shortness of breath, wheezing or hemoptysis.  CARDIOVASCULAR: No chest pain, orthopnea, edema.  GASTROINTESTINAL: No nausea, vomiting, diarrhea or abdominal pain.  GENITOURINARY: No dysuria, hematuria.  ENDOCRINE: No polyuria, nocturia,  HEMATOLOGY: No anemia, easy bruising or bleeding SKIN: No rash or lesion. MUSCULOSKELETAL: No joint pain or arthritis.   NEUROLOGIC: No tingling, numbness, weakness.  PSYCHIATRY: No anxiety or depression.   ROS  DRUG ALLERGIES:  No Known Allergies  VITALS:  Blood pressure (!) 132/53, pulse 91, temperature 98.2 F (36.8 C), temperature source Oral, resp. rate (!) 23, height 5\' 5"  (1.651 m), weight 92.8 kg (204 lb 9 oz), SpO2 97 %.  PHYSICAL EXAMINATION:  GENERAL:  72 y.o.-year-old patient lying in the bed with no acute distress.  EYES: Pupils equal, round, reactive to light and accommodation. No scleral icterus. Extraocular muscles intact.  HEENT: Head atraumatic, normocephalic. Oropharynx and nasopharynx clear.  NECK:  Supple, no jugular venous distention. No thyroid enlargement, no tenderness.  LUNGS: Normal breath sounds bilaterally, no wheezing, rales,rhonchi or crepitation. No use of accessory muscles of respiration.  CARDIOVASCULAR: S1, S2 normal. No murmurs, rubs, or gallops.  ABDOMEN: Soft, nontender, nondistended. Bowel sounds present. No organomegaly or mass.  EXTREMITIES: No pedal edema, cyanosis, or clubbing.  NEUROLOGIC: Cranial nerves II  through XII are intact. Muscle strength 5/5 in all extremities. Sensation intact. Gait not checked.  PSYCHIATRIC: The patient is alert and oriented x 3.  SKIN: No obvious rash, lesion, or ulcer.   Physical Exam LABORATORY PANEL:   CBC Recent Labs  Lab 12/11/17 0616  WBC 11.7*  HGB 9.4*  HCT 28.1*  PLT 364   ------------------------------------------------------------------------------------------------------------------  Chemistries  Recent Labs  Lab 12/08/17 0439  12/11/17 0616  NA 136   < > 135  K 3.6   < > 3.8  CL 102   < > 105  CO2 27   < > 24  GLUCOSE 115*   < > 115*  BUN 18   < > 15  CREATININE 0.53   < > 0.55  CALCIUM 8.1*   < > 8.2*  MG 1.8   < > 1.9  AST 44*  --   --   ALT 28  --   --   ALKPHOS 59  --   --   BILITOT 0.4  --   --    < > = values in this interval not displayed.   ------------------------------------------------------------------------------------------------------------------  Cardiac Enzymes No results for input(s): TROPONINI in the last 168 hours. ------------------------------------------------------------------------------------------------------------------  RADIOLOGY:  No results found.  ASSESSMENT AND PLAN:  72 year old female with history of schizophrenia admitted for sepsis secondary to acute appendicitis secondary to large cecal mass, status post right hemicolectomy  1acute sepsis Resolved Secondary to acute large cecal mass with associatedacuteappendicitis Treated on our sepsis protocol with empiric Rocephin/Flagyl for 7-day course  2acute appendicitis Resolved Treated with empiric course of antibiotics per above for 7 days  S/p Expl Lap Rt. HC w/ JP drain placement 12/06/17  Continue incentive spirometer, tolerating clear liquids-further advancement per surgery, encourage ambulation, physical therapy to evaluate/treat, possible discharge back to group home setting in 1-2 days once cleared by general  surgery  3acute newly diagnosed cecal mass S/Presection as stated above  To follow-up with oncology s/pdischarge for re-evaluation/pathology results will be shared at that time  4acute kidney injury Resolved with IV fluids for rehydration  5chronic schizophrenia Stable Continue home psychotropic regiment  6chronic morbid obesity Most likely secondary to excess calories Lifestyle modification recommended  7elevated troponins Most likely secondary to sepsis/demand ischemia Echocardiogram noted for mild diastolic dysfunction cardiology input appreciated-will follow-up with cardiology s/pdischarge for continued outpatient management/evaluationCTA ofcoronaries  All the records are reviewed and case discussed with Care Management/Social Workerr. Management plans discussed with the patient, family and they are in agreement.  CODE STATUS: full  TOTAL TIME TAKING CARE OF THIS PATIENT: 35 minutes.     POSSIBLE D/C IN 1-2 DAYS, DEPENDING ON CLINICAL CONDITION.   Avel Peace Shylin Keizer M.D on 12/11/2017   Between 7am to 6pm - Pager - [phone]  After 6pm go to [email or web address] - password [email or web address] [facility]  Office  [phone]  CC: Primary care physician; Randel Pigg, MD  Note: This dictation was prepared with Dragon dictation along with smaller phrase technology. Any transcriptional errors that result from this process are unintentional.

## 2017-12-11 NOTE — Progress Notes (Signed)
SUBJECTIVE: Patient is feeling much better.   Vitals:   12/10/17 0357 12/10/17 1405 12/10/17 1948 12/11/17 0523  BP: 140/60 (!) 148/65 (!) 117/54 (!) 132/53  Pulse: 83 78 74 91  Resp: 18  18 (!) 23  Temp: 98.5 F (36.9 C) 98.3 F (36.8 C) 98.5 F (36.9 C) 98.2 F (36.8 C)  TempSrc: Oral Oral Oral Oral  SpO2: 93% 96% 96% 97%  Weight: 212 lb 5 oz (96.3 kg)   204 lb 9 oz (92.8 kg)  Height:        Intake/Output Summary (Last 24 hours) at 12/11/2017 1051 Last data filed at 12/11/2017 1044 Gross per 24 hour  Intake 1549.49 ml  Output 365 ml  Net 1184.49 ml    LABS: Basic Metabolic Panel: Recent Labs    12/10/17 0602 12/10/17 0715 12/11/17 0616  NA  --  133* 135  K 6.7* 4.0 3.8  CL  --  99* 105  CO2  --  27 24  GLUCOSE  --  126* 115*  BUN  --  17 15  CREATININE  --  0.50 0.55  CALCIUM  --  8.2* 8.2*  MG 2.3 1.9 1.9  PHOS 7.8* 3.2  --    Liver Function Tests: No results for input(s): AST, ALT, ALKPHOS, BILITOT, PROT, ALBUMIN in the last 72 hours. No results for input(s): LIPASE, AMYLASE in the last 72 hours. CBC: Recent Labs    12/10/17 0602 12/11/17 0616  WBC 13.2* 11.7*  NEUTROABS 9.5* 8.7*  HGB 10.0* 9.4*  HCT 32.0* 28.1*  MCV 93.5 88.6  PLT 404 364   Cardiac Enzymes: No results for input(s): CKTOTAL, CKMB, CKMBINDEX, TROPONINI in the last 72 hours. BNP: Invalid input(s): POCBNP D-Dimer: No results for input(s): DDIMER in the last 72 hours. Hemoglobin A1C: No results for input(s): HGBA1C in the last 72 hours. Fasting Lipid Panel: No results for input(s): CHOL, HDL, LDLCALC, TRIG, CHOLHDL, LDLDIRECT in the last 72 hours. Thyroid Function Tests: No results for input(s): TSH, T4TOTAL, T3FREE, THYROIDAB in the last 72 hours.  Invalid input(s): FREET3 Anemia Panel: No results for input(s): VITAMINB12, FOLATE, FERRITIN, TIBC, IRON, RETICCTPCT in the last 72 hours.   PHYSICAL EXAM General: Well developed, well nourished, in no acute distress HEENT:   Normocephalic and atramatic Neck:  No JVD.  Lungs: Clear bilaterally to auscultation and percussion. Heart: HRRR . Normal S1 and S2 without gallops or murmurs.  Abdomen: Bowel sounds are positive, abdomen soft and non-tender  Msk:  Back normal, normal gait. Normal strength and tone for age. Extremities: No clubbing, cyanosis or edema.   Neuro: Alert and oriented X 3. Psych:  Good affect, responds appropriately  TELEMETRY: Sinus rhythm  ASSESSMENT AND PLAN: Status post right hemicolectomy for mass in the colon. Patient had elevated troponin for which CTA coronaries will be done upon discharge.  Active Problems:   Sepsis ([place])   Colonic mass   Lactic acidosis    Latoya Mcghee A, MD, [facility] 12/11/2017 10:51 AM

## 2017-12-12 LAB — GLUCOSE, CAPILLARY
Glucose-Capillary: 87 mg/dL (ref 65–99)
Glucose-Capillary: 89 mg/dL (ref 65–99)

## 2017-12-12 MED ORDER — ADULT MULTIVITAMIN W/MINERALS CH
1.0000 | ORAL_TABLET | Freq: Every day | ORAL | Status: DC
  Administered 2017-12-13: 1 via ORAL
  Filled 2017-12-12: qty 1

## 2017-12-12 MED ORDER — ENSURE ENLIVE PO LIQD
237.0000 mL | Freq: Three times a day (TID) | ORAL | Status: DC
  Administered 2017-12-12 – 2017-12-13 (×2): 237 mL via ORAL

## 2017-12-12 NOTE — Evaluation (Signed)
Physical Therapy Evaluation Patient Details Name: Latoya Jones MRN: [id number] DOB: 05/31/1946 Today's Date: 12/12/2017   History of Present Illness  Pt admitted for sepsis s/p c/o dizziness.  PMH includes developmental disorder, mood disorder and dementia.  Clinical Impression  Pt is a 72 year old female who lives in an ALF.  She presents as emotionally labile throughout evaluation and some of the hx she reports is unclear.  Pt requires min-mod A to perform bed mobility and SBA for STS transfer.  Pt stated that she is able to ambulate without a RW but PT recommended use of RW after pt began to use counter for support.  Pt required VC's for proper use of AD, including turning with RW.  Pt presented with overall decreased strength of UE and LE during strength screening.  Pt declined sitting in recliner initially but agreed once PT educated pt concerning importance of upright posture following surgery and prolonged bedrest  Pt will continue to benefit from skilled PT with focus on strength, safe use of AD, functional mobility and balance.    Follow Up Recommendations Home health PT    Equipment Recommendations  Rolling walker with 5" wheels    Recommendations for Other Services       Precautions / Restrictions Precautions Precautions: Fall      Mobility  Bed Mobility Overal bed mobility: Needs Assistance Bed Mobility: Supine to Sit     Supine to sit: Supervision     General bed mobility comments: Pt required handheld assistance and increased time to perform supine to sit.  Transfers Overall transfer level: Needs assistance Equipment used: Rolling walker (2 wheeled) Transfers: Sit to/from Stand Sit to Stand: Min guard         General transfer comment: Pt able to perform STS with physical independence.  Requires VC's from PT for hand placement and safe use of AD.  Ambulation/Gait Ambulation/Gait assistance: Supervision Ambulation Distance (Feet): 40 Feet Assistive  device: Rolling walker (2 wheeled)     Gait velocity interpretation: at or above normal speed for age/gender General Gait Details: Pt presented with flexed posture, low foot clearance, lateral displacement to compensate for decreased hip and knee flexion for foot clearance.  PT provided VC's for safe use of RW including placing device in proximity to body and upright posture.  PT provided VC's for sequencing during turning with RW.  Pt appeared emotionally labile throughout ambulation.  Stairs            Wheelchair Mobility    Modified Rankin (Stroke Patients Only)       Balance Overall balance assessment: Modified Independent                                           Pertinent Vitals/Pain Pain Assessment: Faces Faces Pain Scale: Hurts little more Pain Location: abdominal area  Pain Intervention(s): Limited activity within patient's tolerance    Home Living Family/patient expects to be discharged to:: Assisted living               Home Equipment: None      Prior Function Level of Independence: Needs assistance   Gait / Transfers Assistance Needed: Pt reports that she does not use an AD for assistance normally.  ADL's / Homemaking Assistance Needed: Pt states that she receives assistance with bathing and dressing.        Hand Dominance  Extremity/Trunk Assessment   Upper Extremity Assessment Upper Extremity Assessment: Generalized weakness    Lower Extremity Assessment Lower Extremity Assessment: Generalized weakness    Cervical / Trunk Assessment Cervical / Trunk Assessment: Normal  Communication   Communication: No difficulties  Cognition Arousal/Alertness: Awake/alert Behavior During Therapy: Agitated Overall Cognitive Status: History of cognitive impairments - at baseline                                 General Comments: Pt emotionally labile, begining to cry when PT asked pt to sit in recliner and  ambulate in hallway.      General Comments      Exercises     Assessment/Plan    PT Assessment Patient needs continued PT services  PT Problem List Decreased strength;Decreased balance;Decreased activity tolerance;Decreased mobility;Decreased knowledge of use of DME;Pain       PT Treatment Interventions DME instruction;Therapeutic activities;Gait training;Therapeutic exercise;Functional mobility training;Balance training;Patient/family education    PT Goals (Current goals can be found in the Care Plan section)  Acute Rehab PT Goals Patient Stated Goal: to return to ALF PT Goal Formulation: With patient Time For Goal Achievement: 12/26/17 Potential to Achieve Goals: Good    Frequency Min 2X/week   Barriers to discharge        Co-evaluation               AM-PAC PT "6 Clicks" Daily Activity  Outcome Measure Difficulty turning over in bed (including adjusting bedclothes, sheets and blankets)?: A Little Difficulty moving from lying on back to sitting on the side of the bed? : A Lot Difficulty sitting down on and standing up from a chair with arms (e.g., wheelchair, bedside commode, etc,.)?: A Little Help needed moving to and from a bed to chair (including a wheelchair)?: A Little Help needed walking in hospital room?: A Little Help needed climbing 3-5 steps with a railing? : A Little 6 Click Score: 17    End of Session Equipment Utilized During Treatment: Gait belt Activity Tolerance: Treatment limited secondary to agitation Patient left: in chair;with call bell/phone within reach;with chair alarm set;with family/visitor present Nurse Communication: Mobility status PT Visit Diagnosis: Unsteadiness on feet (R26.81);Muscle weakness (generalized) (M62.81);Pain Pain - part of body: (abdominal area)    Time: 1455-1520 PT Time Calculation (min) (ACUTE ONLY): 25 min   Charges:   PT Evaluation $PT Eval Low Complexity: 1 Low PT Treatments $Gait Training: 8-22 mins    PT G Codes:   PT G-Codes **NOT FOR INPATIENT CLASS** Functional Assessment Tool Used: AM-PAC 6 Clicks Basic Mobility Functional Limitation: Mobility: Walking and moving around Mobility: Walking and Moving Around Current Status (R4270): At least 40 percent but less than 60 percent impaired, limited or restricted Mobility: Walking and Moving Around Goal Status [phone]): At least 1 percent but less than 20 percent impaired, limited or restricted    Roxanne Gates, PT, DPT   Roxanne Gates 12/12/2017, 3:43 PM

## 2017-12-12 NOTE — Progress Notes (Signed)
SUBJECTIVE: Pt is not having chest pain, denies shortness of breath.   Vitals:   12/11/17 0523 12/11/17 2131 12/12/17 0446 12/12/17 0500  BP: (!) 132/53 (!) 114/49 (!) 120/51   Pulse: 91 86 81   Resp: (!) 23 18 17    Temp: 98.2 F (36.8 C) 98.8 F (37.1 C) 98.7 F (37.1 C)   TempSrc: Oral Oral Oral   SpO2: 97% 96% 95%   Weight: 204 lb 9 oz (92.8 kg)   204 lb 9 oz (92.8 kg)  Height:        Intake/Output Summary (Last 24 hours) at 12/12/2017 1028 Last data filed at 12/12/2017 0900 Gross per 24 hour  Intake 240 ml  Output 190 ml  Net 50 ml    LABS: Basic Metabolic Panel: Recent Labs    12/10/17 0602 12/10/17 0715 12/11/17 0616  NA  --  133* 135  K 6.7* 4.0 3.8  CL  --  99* 105  CO2  --  27 24  GLUCOSE  --  126* 115*  BUN  --  17 15  CREATININE  --  0.50 0.55  CALCIUM  --  8.2* 8.2*  MG 2.3 1.9 1.9  PHOS 7.8* 3.2  --    Liver Function Tests: No results for input(s): AST, ALT, ALKPHOS, BILITOT, PROT, ALBUMIN in the last 72 hours. No results for input(s): LIPASE, AMYLASE in the last 72 hours. CBC: Recent Labs    12/10/17 0602 12/11/17 0616  WBC 13.2* 11.7*  NEUTROABS 9.5* 8.7*  HGB 10.0* 9.4*  HCT 32.0* 28.1*  MCV 93.5 88.6  PLT 404 364   Cardiac Enzymes: No results for input(s): CKTOTAL, CKMB, CKMBINDEX, TROPONINI in the last 72 hours. BNP: Invalid input(s): POCBNP D-Dimer: No results for input(s): DDIMER in the last 72 hours. Hemoglobin A1C: No results for input(s): HGBA1C in the last 72 hours. Fasting Lipid Panel: No results for input(s): CHOL, HDL, LDLCALC, TRIG, CHOLHDL, LDLDIRECT in the last 72 hours. Thyroid Function Tests: No results for input(s): TSH, T4TOTAL, T3FREE, THYROIDAB in the last 72 hours.  Invalid input(s): FREET3 Anemia Panel: No results for input(s): VITAMINB12, FOLATE, FERRITIN, TIBC, IRON, RETICCTPCT in the last 72 hours.   PHYSICAL EXAM General: Well developed, well nourished, in no acute distress HEENT:  Normocephalic and  atramatic Neck:  No JVD.  Lungs: Clear bilaterally to auscultation and percussion. Heart: HRRR . Normal S1 and S2 without gallops or murmurs.  Abdomen: Bowel sounds are positive, abdomen soft and non-tender  Msk:  Back normal, normal gait. Normal strength and tone for age. Extremities: No clubbing, cyanosis or edema.   Neuro: Alert and oriented X 3. Psych:  Good affect, responds appropriately  ASSESSMENT AND PLAN: Status post right hemicolectomy for mass in colon. Due to history of elevated troponin advise outpaitent CTA coronaries. Given directions for follow up with Dr. Humphrey Rolls and pt and relative at bedside verbalized understanding.   Active Problems:   Sepsis ([place])   Colonic mass   Lactic acidosis    Jake Bathe, NP-C 12/12/2017 10:28 AM Cell: [phone]

## 2017-12-12 NOTE — Progress Notes (Signed)
Initial Nutrition Assessment  DOCUMENTATION CODES:   Obesity unspecified  INTERVENTION:  Provide Ensure Enlive po TID, each supplement provides 350 kcal and 20 grams of protein.  Provide daily MVI.  NUTRITION DIAGNOSIS:   Inadequate oral intake related to acute illness(abdominal pain, constipation, cecal mass) as evidenced by per patient/family report.  Ongoing inadequate intake.  GOAL:   Patient will meet greater than or equal to 90% of their needs  Progressing.  MONITOR:   Diet advancement, Labs, Weight trends, Skin, I & O's  REASON FOR ASSESSMENT:   Consult New TPN/TNA  ASSESSMENT:   72 year old female with PMHx of developmental disorder, mood disorder, dementia, who presents from adult care home with dizziness, abdominal pain, constipation found to have large cecal mass on CT. Patient now s/p exploratory laparotomy, right hemicolectomy with terminal ileum resection, separate bowel resection on 1/15.   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 -Patient was started on CLD 1/19. She was then advanced to soft diet 1/20. -Per chart JP drain will likely be removed prior to discharge. Possible discharge tomorrow.  Met with patient and her daughter at bedside. She is currently POD#6. Patient reports she is tolerating her soft diet. She reports she is eating about 50% of meals - fairly close to meal completion recorded in chart. She is having occasional pain in the evenings, but denies any N/V or abdominal pain associated with eating. She is passing flatus and having bowel movements. Discussed with patient that she is not quite meeting her calorie and protein needs yet with just her meals. Discussed it may take a while for her appetite to return to normal. She is amenable to drinking Ensure to help meet those needs in the meantime.  IV Access: right basilic double lumen PICC placed 12/07/2017;  placement verified at bedside by IV team  TPN: pt initiated Clinimix E 5/15 at 40 mL/hr + 20% ILE at 20 mL/hr over 12 hrs on 1/16; on 1/17 advanced to goal of Clinimix E 5/15 at 83 mL/hr + 20% ILE at 20 mL/hr over 12 hrs; on afternoon of 1/20 Attending discontinued TPN order that had been placed on 1/19 and only had about 4 hours left to run in bag; no titration down to 1/2 rate first; CBGs appear fine per chart  Meal Completion: 40-60% In the past 24 hours patient has had approximately 733 kcal (42% minimum estimated kcal needs) and 24 grams of protein (25% minimum estimated protein needs).  Medications reviewed and include: Oscal with D 1 tablet daily.  Labs reviewed: CBG 87-134.  I/O: 7 occurrences urine output yesterday; 2 BM yesterday, 190 mL from JP drain on abdomen  Weight trend: 92.8 kg 1/21; -1kg from admission weight  Discussed with pharmacy.  Diet Order:  DIET SOFT Room service appropriate? Yes; Fluid consistency: Thin  EDUCATION NEEDS:   Education needs have been addressed  Skin:  Skin Assessment: Skin Integrity Issues: Skin Integrity Issues:: Incisions Incisions: closed incision to abdomen  Last BM:  12/12/2017 - large type 6  Height:   Ht Readings from Last 1 Encounters:  12/06/17 '5\' 5"'  (1.651 m)    Weight:   Wt Readings from Last 1 Encounters:  12/12/17 204 lb 9 oz (92.8 kg)    Ideal Body Weight:  56.8 kg  BMI:  Body mass index is 34.04 kg/m.  Estimated Nutritional Needs:   Kcal:  1760-2060 (MSJ x 1.2-1.4)  Protein:  95-115 grams (1-1.2  grams/kg)  Fluid:  1.7-2 L/day (30-35 mL/kg IBW)  Willey Blade, MS, RD, LDN Office: [phone] Pager: [phone] After Hours/Weekend Pager: [phone]

## 2017-12-12 NOTE — Progress Notes (Signed)
12/12/2017  Subjective: Patient is 6 Days Post-Op s/p right colon and small bowel resection.  Today she's doing well and has been tolerating a soft diet.  She reports having bowel movement yesterday.  Her pain is well controlled.  Vital signs: Temp:  [98.7 F (37.1 C)-98.8 F (37.1 C)] 98.7 F (37.1 C) (01/21 0446) Pulse Rate:  [81-86] 81 (01/21 0446) Resp:  [17-18] 17 (01/21 0446) BP: (114-120)/(49-51) 120/51 (01/21 0446) SpO2:  [95 %-96 %] 95 % (01/21 0446) Weight:  [92.8 kg (204 lb 9 oz)] 92.8 kg (204 lb 9 oz) (01/21 0500)   Intake/Output: 01/20 0701 - 01/21 0700 In: 480 [P.O.:480] Out: 190 [Drains:190] Last BM Date: 12/11/17  Physical Exam: Constitutional: No acute distress Abdomen:  Soft, non-distended, appropriately tender to palpation.  Incision in clean, dry, intact with staples in place and no evidence of infection.  JP drain with serosanguinous fluid.  Labs:  Recent Labs    12/10/17 0602 12/11/17 0616  WBC 13.2* 11.7*  HGB 10.0* 9.4*  HCT 32.0* 28.1*  PLT 404 364   Recent Labs    12/10/17 0715 12/11/17 0616  NA 133* 135  K 4.0 3.8  CL 99* 105  CO2 27 24  GLUCOSE 126* 115*  BUN 17 15  CREATININE 0.50 0.55  CALCIUM 8.2* 8.2*   No results for input(s): LABPROT, INR in the last 72 hours.  Imaging: No results found.  Assessment/Plan: 72 yo female s/p right colon and small bowel resection  --continue soft diet and can transition to oral pain medication as needed. --continue JP drain, will likely remove prior to discharge. --PT consult placed to eval patient for dispo planning. --from surgical standpoint, would be reasonable to discharge 1/22.   Melvyn Neth, [place]

## 2017-12-12 NOTE — Progress Notes (Signed)
[facility] [place] NAME: Latoya Jones    MR#:  [id number]  DATE OF BIRTH:  November 30, 1945  SUBJECTIVE:  no dizziness today.  Patient had a BM this morning.  CHIEF COMPLAINT:   Chief Complaint  Patient presents with  . Dizziness  Patient without complaint, tolerating clear liquid diet, no events overnight per nursing staff  REVIEW OF SYSTEMS:  CONSTITUTIONAL: No fever, fatigue or weakness.  EYES: No blurred or double vision.  EARS, NOSE, AND THROAT: No tinnitus or ear pain.  RESPIRATORY: No cough, shortness of breath, wheezing or hemoptysis.  CARDIOVASCULAR: No chest pain, orthopnea, edema.  GASTROINTESTINAL: No nausea, vomiting, diarrhea or abdominal pain.  GENITOURINARY: No dysuria, hematuria.  ENDOCRINE: No polyuria, nocturia,  HEMATOLOGY: No anemia, easy bruising or bleeding SKIN: No rash or lesion. MUSCULOSKELETAL: No joint pain or arthritis.   NEUROLOGIC: No tingling, numbness, weakness.  PSYCHIATRY: No anxiety or depression.   ROS  DRUG ALLERGIES:  No Known Allergies  VITALS:  Blood pressure (!) 120/51, pulse 81, temperature 98.7 F (37.1 C), temperature source Oral, resp. rate 17, height 5\' 5"  (1.651 m), weight 92.8 kg (204 lb 9 oz), SpO2 95 %.  PHYSICAL EXAMINATION:  GENERAL:  72 y.o.-year-old patient lying in the bed with no acute distress.  EYES: Pupils equal, round, reactive to light and accommodation. No scleral icterus. Extraocular muscles intact.  HEENT: Head atraumatic, normocephalic. Oropharynx and nasopharynx clear.  NECK:  Supple, no jugular venous distention. No thyroid enlargement, no tenderness.  LUNGS: Normal breath sounds bilaterally, no wheezing, rales,rhonchi or crepitation. No use of accessory muscles of respiration.  CARDIOVASCULAR: S1, S2 normal. No murmurs, rubs, or gallops.  ABDOMEN: Soft, nontender, nondistended. Bowel sounds present. No organomegaly or mass.  JP drain in place in right lower  quadrant EXTREMITIES: No pedal edema, cyanosis, or clubbing.  NEUROLOGIC: Cranial nerves II through XII are intact. Muscle strength 5/5 in all extremities. Sensation intact. Gait not checked.  PSYCHIATRIC: The patient is alert and oriented x 3.  SKIN: No obvious rash, lesion, or ulcer.   Physical Exam LABORATORY PANEL:   CBC Recent Labs  Lab 12/11/17 0616  WBC 11.7*  HGB 9.4*  HCT 28.1*  PLT 364   ------------------------------------------------------------------------------------------------------------------  Chemistries  Recent Labs  Lab 12/08/17 0439  12/11/17 0616  NA 136   < > 135  K 3.6   < > 3.8  CL 102   < > 105  CO2 27   < > 24  GLUCOSE 115*   < > 115*  BUN 18   < > 15  CREATININE 0.53   < > 0.55  CALCIUM 8.1*   < > 8.2*  MG 1.8   < > 1.9  AST 44*  --   --   ALT 28  --   --   ALKPHOS 59  --   --   BILITOT 0.4  --   --    < > = values in this interval not displayed.   ------------------------------------------------------------------------------------------------------------------  Cardiac Enzymes No results for input(s): TROPONINI in the last 168 hours. ------------------------------------------------------------------------------------------------------------------  RADIOLOGY:  No results found.  ASSESSMENT AND PLAN:  72 year old female with history of schizophrenia admitted for sepsis secondary to acute appendicitis secondary to large cecal mass, status post right hemicolectomy  1acute sepsis Resolved Secondary to acute large cecal mass with associatedacuteappendicitis Treated on our sepsis protocol with empiric Rocephin/Flagyl for 7-day course  2acute appendicitis Resolved Treated with empiric course  of antibiotics per above for 7 days  S/p Expl Lap Rt. HC w/ JP drain placement 12/06/17 Continue incentive spirometer, tolerating clear liquids-further advancement per surgery, encourage ambulation, physical therapy to evaluate/treat,  possible discharge back to group home setting in 1-2 days once cleared by general surgery  3acute newly diagnosed cecal mass S/Presection as stated above, To follow-up with oncology s/pdischarge for re-evaluation/pathology results will be shared at that time  4acute kidney injury Resolved with IV fluids for rehydration  5chronic schizophrenia Stable Continue home psychotropic regiment  6chronic morbid obesity Most likely secondary to excess calories Lifestyle modification recommended  7elevated troponins Most likely secondary to sepsis/demand ischemia Echocardiogram noted for mild diastolic dysfunction cardiology input appreciated-will follow-up with cardiology s/pdischarge for continued outpatient management/evaluationCTA of coronaries  Deconditioning: Physical therapy consulted.  All the records are reviewed and case discussed with Care Management/Social Workerr. Management plans discussed with the patient, family and they are in agreement.  CODE STATUS: full  TOTAL TIME TAKING CARE OF THIS PATIENT: 35 minutes.     POSSIBLE D/C IN 1-2 DAYS, DEPENDING ON CLINICAL CONDITION.   Epifanio Lesches M.D on 12/12/2017   Between 7am to 6pm - Pager - [phone]  After 6pm go to [email or web address] - password [email or web address] [facility]  Office  [phone]  CC: Primary care physician; Randel Pigg, MD  Note: This dictation was prepared with Dragon dictation along with smaller phrase technology. Any transcriptional errors that result from this process are unintentional.

## 2017-12-13 LAB — CBC
HEMATOCRIT: 27.6 % — AB (ref 35.0–47.0)
Hemoglobin: 9.3 g/dL — ABNORMAL LOW (ref 12.0–16.0)
MCH: 30.6 pg (ref 26.0–34.0)
MCHC: 33.9 g/dL (ref 32.0–36.0)
MCV: 90.2 fL (ref 80.0–100.0)
PLATELETS: 345 10*3/uL (ref 150–440)
RBC: 3.06 MIL/uL — AB (ref 3.80–5.20)
RDW: 13.8 % (ref 11.5–14.5)
WBC: 9.2 10*3/uL (ref 3.6–11.0)

## 2017-12-13 MED ORDER — ADULT MULTIVITAMIN W/MINERALS CH: 1.0000 | ORAL_TABLET | Freq: Every day | ORAL | 0 refills | Status: DC

## 2017-12-13 NOTE — Plan of Care (Signed)
Progressing Education: Knowledge of General Education information will improve 12/13/2017 1318 - Progressing by Rowe Robert, RN Clinical Measurements: Ability to maintain clinical measurements within normal limits will improve 12/13/2017 1318 - Progressing by Rowe Robert, RN Will remain free from infection 12/13/2017 1318 - Progressing by Rowe Robert, RN Diagnostic test results will improve 12/13/2017 1318 - Progressing by Rowe Robert, RN Note K 2.4 md notified and pharmacy consult  and k doses ordered Respiratory complications will improve 12/13/2017 1318 - Progressing by Rowe Robert, RN Cardiovascular complication will be avoided 12/13/2017 1318 - Progressing by Rowe Robert, RN Activity: Risk for activity intolerance will decrease 12/13/2017 1318 - Progressing by Rowe Robert, RN Coping: Level of anxiety will decrease 12/13/2017 1318 - Progressing by Rowe Robert, RN Note Xanax for anxiety Elimination: Will not experience complications related to bowel motility 12/13/2017 1318 - Progressing by Rowe Robert, RN Will not experience complications related to urinary retention 12/13/2017 1318 - Progressing by Rowe Robert, RN Pain Managment: General experience of comfort will improve 12/13/2017 1318 - Progressing by Rowe Robert, RN Note Atc pain med ordered Safety: Ability to remain free from injury will improve 12/13/2017 1318 - Progressing by Rowe Robert, RN Skin Integrity: Risk for impaired skin integrity will decrease 12/13/2017 1318 - Progressing by Rowe Robert, RN Spiritual Needs Ability to function at adequate level 12/13/2017 1318 - Progressing by Rowe Robert, RN Education: Knowledge of General Education information will improve 12/13/2017 1318 - Progressing by Rowe Robert, RN Health Behavior/Discharge Planning: Ability to manage health-related needs will improve 12/13/2017 1318 - Progressing by Rowe Robert, RN Clinical  Measurements: Ability to maintain clinical measurements within normal limits will improve 12/13/2017 1318 - Progressing by Rowe Robert, RN Will remain free from infection 12/13/2017 1318 - Progressing by Rowe Robert, RN Diagnostic test results will improve 12/13/2017 1318 - Progressing by Rowe Robert, RN Respiratory complications will improve 12/13/2017 1318 - Progressing by Rowe Robert, RN Cardiovascular complication will be avoided 12/13/2017 1318 - Progressing by Rowe Robert, RN Activity: Risk for activity intolerance will decrease 12/13/2017 1318 - Progressing by Rowe Robert, RN Nutrition: Adequate nutrition will be maintained 12/13/2017 1318 - Progressing by Rowe Robert, RN Coping: Level of anxiety will decrease 12/13/2017 1318 - Progressing by Rowe Robert, RN Elimination: Will not experience complications related to bowel motility 12/13/2017 1318 - Progressing by Rowe Robert, RN Will not experience complications related to urinary retention 12/13/2017 1318 - Progressing by Rowe Robert, RN Pain Managment: General experience of comfort will improve 12/13/2017 1318 - Progressing by Rowe Robert, RN Safety: Ability to remain free from injury will improve 12/13/2017 1318 - Progressing by Rowe Robert, RN Skin Integrity: Risk for impaired skin integrity will decrease 12/13/2017 1318 - Progressing by Rowe Robert, RN   Progressing Education: Knowledge of General Education information will improve 12/13/2017 1318 - Progressing by Rowe Robert, RN Clinical Measurements: Ability to maintain clinical measurements within normal limits will improve 12/13/2017 1318 - Progressing by Rowe Robert, RN Will remain free from infection 12/13/2017 1318 - Progressing by Rowe Robert, RN Diagnostic test results will improve 12/13/2017 1318 - Progressing by Rowe Robert, RN Note K 2.4 md notified and pharmacy consult  and k doses ordered Respiratory  complications will improve 12/13/2017 1318 - Progressing by Rowe Robert, RN Cardiovascular complication will be avoided 12/13/2017 1318 - Progressing by Rowe Robert, RN Activity: Risk for activity intolerance will decrease 12/13/2017 1318 - Progressing by Rowe Robert, RN Coping: Level of anxiety will decrease 12/13/2017 1318 - Progressing by  Rowe Robert, RN Note Xanax for anxiety Elimination: Will not experience complications related to bowel motility 12/13/2017 1318 - Progressing by Rowe Robert, RN Will not experience complications related to urinary retention 12/13/2017 1318 - Progressing by Rowe Robert, RN Pain Managment: General experience of comfort will improve 12/13/2017 1318 - Progressing by Rowe Robert, RN Note Atc pain med ordered Safety: Ability to remain free from injury will improve 12/13/2017 1318 - Progressing by Rowe Robert, RN Skin Integrity: Risk for impaired skin integrity will decrease 12/13/2017 1318 - Progressing by Rowe Robert, RN Spiritual Needs Ability to function at adequate level 12/13/2017 1318 - Progressing by Rowe Robert, RN Education: Knowledge of General Education information will improve 12/13/2017 1318 - Progressing by Rowe Robert, RN Health Behavior/Discharge Planning: Ability to manage health-related needs will improve 12/13/2017 1318 - Progressing by Rowe Robert, RN Clinical Measurements: Ability to maintain clinical measurements within normal limits will improve 12/13/2017 1318 - Progressing by Rowe Robert, RN Will remain free from infection 12/13/2017 1318 - Progressing by Rowe Robert, RN Diagnostic test results will improve 12/13/2017 1318 - Progressing by Rowe Robert, RN Respiratory complications will improve 12/13/2017 1318 - Progressing by Rowe Robert, RN Cardiovascular complication will be avoided 12/13/2017 1318 - Progressing by Rowe Robert, RN Activity: Risk for activity intolerance will  decrease 12/13/2017 1318 - Progressing by Rowe Robert, RN Nutrition: Adequate nutrition will be maintained 12/13/2017 1318 - Progressing by Rowe Robert, RN Coping: Level of anxiety will decrease 12/13/2017 1318 - Progressing by Rowe Robert, RN Elimination: Will not experience complications related to bowel motility 12/13/2017 1318 - Progressing by Rowe Robert, RN Will not experience complications related to urinary retention 12/13/2017 1318 - Progressing by Rowe Robert, RN Pain Managment: General experience of comfort will improve 12/13/2017 1318 - Progressing by Rowe Robert, RN Safety: Ability to remain free from injury will improve 12/13/2017 1318 - Progressing by Rowe Robert, RN Skin Integrity: Risk for impaired skin integrity will decrease 12/13/2017 1318 - Progressing by Rowe Robert, RN

## 2017-12-13 NOTE — Clinical Social Work Note (Addendum)
CSW spoke with [name] administrator from [facility].  Administrator, had some questions regarding nursing care that she needed answers to before she can accept patient back.  CSW contacted bedside nurse, and gave her contact information for administrator.  CSW awaiting call back from administrator.  11:15am CSW received phone call back from [facility], regarding patient, she can pick patient up between 3pm and 4pm this afternoon, once discharge summary has been faxed and FL2 updated.  12:15pm  CSW faxed discharge summary and FL2 to family care home at [phone].  Patient to be d/c'ed today to [facility]  Patient and family agreeable to plans will transport via facility transportation,  RN to call report.  Jones Broom. [place], MSW, [place]  12/13/2017 10:29 AM

## 2017-12-13 NOTE — Care Management (Signed)
Discharge to home today per Dr. Vianne Bulls. Spoke with Ms. [name] at [facility]. Discussed Home Health agencies. [place]. Will update Floydene Flock, [facility] representative. [facility] will provide rollng walker. Ms. Owens Shark will transport Latoya Ammons RN MSN [place] [facility] [phone]

## 2017-12-13 NOTE — Progress Notes (Signed)
Physical Therapy Treatment Patient Details Name: Latoya Jones MRN: [id number] DOB: 10/15/1946 Today's Date: 12/13/2017    History of Present Illness Pt admitted for sepsis s/p c/o dizziness.  PMH includes developmental disorder, mood disorder and dementia.    PT Comments    Pt awaiting discharge but agrees to ambulate.  Bed mobility with ease.  Pt was able to stand and ambulate to nursing station and back to bed.  Pt with increased anxiety during gait but calmed with encouragement.  No LOB noted but should have +1 assist to manage drains and for general safety with mobility.   Follow Up Recommendations  Home health PT     Equipment Recommendations  Rolling walker with 5" wheels    Recommendations for Other Services       Precautions / Restrictions Precautions Precautions: Fall Restrictions Weight Bearing Restrictions: No    Mobility  Bed Mobility Overal bed mobility: Needs Assistance Bed Mobility: Supine to Sit     Supine to sit: Supervision        Transfers Overall transfer level: Needs assistance Equipment used: Rolling walker (2 wheeled) Transfers: Sit to/from Stand Sit to Stand: Min guard            Ambulation/Gait Ambulation/Gait assistance: Supervision;Min guard Ambulation Distance (Feet): 50 Feet Assistive device: Rolling walker (2 wheeled) Gait Pattern/deviations: Decreased step length - right;Decreased step length - left;Step-through pattern   Gait velocity interpretation: Below normal speed for age/gender     Stairs            Wheelchair Mobility    Modified Rankin (Stroke Patients Only)       Balance Overall balance assessment: Modified Independent                                          Cognition Arousal/Alertness: Awake/alert Behavior During Therapy: Anxious Overall Cognitive Status: History of cognitive impairments - at baseline                                        Exercises       General Comments        Pertinent Vitals/Pain Pain Assessment: 0-10 Faces Pain Scale: Hurts little more Pain Location: abdominal area  Pain Descriptors / Indicators: Sore Pain Intervention(s): Limited activity within patient's tolerance    Home Living                      Prior Function            PT Goals (current goals can now be found in the care plan section) Progress towards PT goals: Progressing toward goals    Frequency    Min 2X/week      PT Plan Current plan remains appropriate    Co-evaluation              AM-PAC PT "6 Clicks" Daily Activity  Outcome Measure  Difficulty turning over in bed (including adjusting bedclothes, sheets and blankets)?: None Difficulty moving from lying on back to sitting on the side of the bed? : None Difficulty sitting down on and standing up from a chair with arms (e.g., wheelchair, bedside commode, etc,.)?: A Little Help needed moving to and from a bed to chair (including a wheelchair)?: A Little Help needed walking in  hospital room?: A Little Help needed climbing 3-5 steps with a railing? : A Little 6 Click Score: 20    End of Session Equipment Utilized During Treatment: Gait belt Activity Tolerance: Patient tolerated treatment well Patient left: in bed;with bed alarm set;with call bell/phone within reach;with family/visitor present Nurse Communication: Mobility status       Time: 5379-4327 PT Time Calculation (min) (ACUTE ONLY): 11 min  Charges:  $Gait Training: 8-22 mins                    G Codes:      Chesley Noon, PTA 12/13/17, 1:29 PM

## 2017-12-13 NOTE — Discharge Summary (Addendum)
Latoya Jones, is a 72 y.o. female  DOB 05/31/1946  MRN [id number].  Admission date:  12/03/2017  Admitting Physician  Harrie Foreman, MD  Discharge Date:  12/13/2017   Primary MD  Randel Pigg, MD  Recommendations for primary care physician for things to follow Follow-up with Associates regarding abdominal suture removal.   Admission Diagnosis  Lactic acidosis [E87.2] Dizziness [R42] Hyponatremia [E87.1]   Discharge Diagnosis  Lactic acidosis [E87.2] Dizziness [R42] Hyponatremia [E87.1]   Active Problems:   Sepsis ([place])   Colonic mass   Lactic acidosis      History reviewed. No pertinent past medical history.  Past Surgical History:  Procedure Laterality Date  . COLOSTOMY REVISION N/A 12/06/2017   Procedure: COLON RESECTION RIGHT;  Surgeon: Florene Glen, MD;  Location: [facility] ORS;  Service: General;  Laterality: N/A;       History of present illness and  Hospital Course:     Kindly see H&P for history of present illness and admission details, please review complete Labs, Consult reports and Test reports for all details in brief  HPI  from the history and physical done on the day of admission  72 year old female patient came in because of breath, abdominal pain found to have acute appendicitis, sepsis.  Hospital Course  acute sepsis with evidence of lactic acidosis, elevated white count on admission;Secondary to acute large cecal mass with associatedacuteappendicitis, surgery recommended conservative treatment with antibiotics treated with Cipro, Flagyl for 7 days.  Patient abdominal pain did not improve despite conservative treatment so she went for surgery.   2acute appendicitis S/p Expl Lap Rt. HC w/ JP drain placement 12/06/17 Tolerated surgery well, we advanced the diet from  clear liquid to regular diet, physical therapy recommended home health physical therapy.  Surgery is planning to follow her in the office in a week regarding her staple removal in abdomen, JP drain to be removed as per surgery before we discharge the patient.  3acute newly diagnosed cecal mass S/Presection as stated above, Follow-up with hematology oncology, can follow-up with cancer center as an outpatient regarding treatment options including chemotherapy, staging. seen by gastroenterology they recommended outpatient follow-up.  Colonoscopy not done because patient has acute appendicitis this admission.   4acute kidney injury Resolved with IV fluids for rehydration  5chronic schizophrenia Stable Continue home psychotropic regiment  6chronic morbid obesity Most likely secondary to excess calories Lifestyle modification recommended  7elevated troponins Most likely secondary to sepsis/demand ischemia Echocardiogram noted for mild diastolic dysfunction cardiology input appreciated, seen by cardiology, wanted to follow her in the office this Friday at 10 AM.     Discharge Condition: Stable  Follow UP   Contact information for follow-up providers    Sindy Guadeloupe, MD. Go on 12/22/2017.   Specialty:  Oncology Why:  9:45AM-lab. 10:00AM appt with Dr. Minette Brine information: [place] [address] [phone]            Contact information for after-discharge care    Destination    [facility]   Service:  Group Home Contact information: [address] [place] [phone]                    Discharge Instructions  and  Discharge Medications     Discharge Instructions    Face-to-face encounter (required for Medicare/Medicaid patients)   Complete by:  As directed    I Epifanio Lesches certify that this patient is  under my care and that I, or a nurse practitioner or physician's assistant working  with me, had a face-to-face encounter that meets the physician face-to-face encounter requirements with this patient on 12/13/2017. The encounter with the patient was in whole, or in part for the following medical condition(s) which is the primary reason for home health care  #1 post right hemicolectomy wound care and suggested care. 2.  Alzheimer's dementia Deconditioning History of schizophrenia   The encounter with the patient was in whole, or in part, for the following medical condition, which is the primary reason for home health care:  whole   I certify that, based on my findings, the following services are medically necessary home health services:   Nursing Physical therapy     Reason for Medically Necessary Home Health Services:  Therapy- Personnel officer, Public librarian   My clinical findings support the need for the above services:  Unable to leave home safely without assistance and/or assistive device   Further, I certify that my clinical findings support that this patient is homebound due to:  Unable to leave home safely without assistance   Home Health   Complete by:  As directed    To provide the following care/treatments:   PT SLP       Allergies as of 12/13/2017   No Known Allergies     Medication List    TAKE these medications   acetaminophen 500 MG tablet Commonly known as:  TYLENOL Take 1,000 mg by mouth every 8 (eight) hours as needed for mild pain.   alendronate 70 MG tablet Commonly known as:  FOSAMAX Take 70 mg by mouth once a week.   ARIPiprazole 5 MG tablet Commonly known as:  ABILIFY Take 5 mg by mouth at bedtime.   benztropine 0.5 MG tablet Commonly known as:  COGENTIN Take 0.5 mg by mouth at bedtime.   Calcium 600-200 MG-UNIT tablet Take 1 tablet by mouth daily.   calcium carbonate 500 MG chewable tablet Commonly known as:  TUMS - dosed in mg elemental calcium Chew 1 tablet by mouth 2 (two) times daily as needed for  indigestion or heartburn.   divalproex 500 MG DR tablet Commonly known as:  DEPAKOTE Take 500 mg by mouth at bedtime.   memantine 14 MG Cp24 24 hr capsule Commonly known as:  NAMENDA XR Take 14 mg by mouth at bedtime.   multivitamin with minerals Tabs tablet Take 1 tablet by mouth daily.   perphenazine 4 MG tablet Commonly known as:  TRILAFON Take 4 mg by mouth 2 (two) times daily.         Diet and Activity recommendation: See Discharge Instructions above   Consults obtained -general surgery, cardiology, oncology   Major procedures and Radiology Reports - PLEASE review detailed and final reports for all details, in brief -     Dg Chest 2 View  Result Date: 12/03/2017 CLINICAL DATA:  Dizzy spells and diarrhea.  Fall.  Leg weakness. EXAM: CHEST  2 VIEW COMPARISON:  None. FINDINGS: Cardiomediastinal silhouette is normal. No pleural effusions or focal consolidations. LEFT lung base strandy densities. Trachea projects midline and there is no pneumothorax. Soft tissue planes and included osseous structures are non-suspicious. IMPRESSION: LEFT lung base atelectasis/scarring. Electronically Signed   By: Elon Alas M.D.   On: 12/03/2017 06:15   Ct Chest Wo Contrast  Result Date: 12/03/2017 CLINICAL DATA:  Unresolved pneumonia EXAM: CT CHEST WITHOUT CONTRAST TECHNIQUE: Multidetector CT imaging of  the chest was performed following the standard protocol without IV contrast. COMPARISON:  CT abdomen pelvis 12/03/2016 FINDINGS: Cardiovascular: Limited without intravenous contrast. Mild atherosclerotic calcification. No aneurysmal dilatation. Normal heart size. Small pericardial fluid Mediastinum/Nodes: Midline trachea. Coarse calcification left lobe of thyroid. Esophagus contains high density material within the distal lumen. Mild thickening at the GE junction. Subcentimeter mediastinal lymph nodes. Lungs/Pleura: 2 mm right upper lobe pulmonary nodule, series 3, image number 32. Trace  pleural effusions. Linear atelectasis within the lower lobes. No consolidation. Upper Abdomen: High density material in the gallbladder may reflect vicarious contrast excretion versus small stones. Partially visible cysts in the left kidney. Mild excreted contrast in the left renal collecting system. Musculoskeletal: No acute or suspicious bone lesion. IMPRESSION: 1. Trace pleural effusions with linear atelectasis at both lower lobes. No consolidative infiltrate is seen. 2. Tiny 2 mm right upper lobe pulmonary nodule. Could consider 3 to six-month CT chest follow-up given findings on recent abdominal CT. 3. High density material within the distal lumen of the esophagus could be due to contrast or radiopaque ingested material. 4. Mild contrast or small stones in the gallbladder. Aortic Atherosclerosis (ICD10-I70.0). Electronically Signed   By: Donavan Foil M.D.   On: 12/03/2017 18:13   Ct Abdomen Pelvis W Contrast  Result Date: 12/03/2017 CLINICAL DATA:  Generalized abdominal pain. EXAM: CT ABDOMEN AND PELVIS WITH CONTRAST TECHNIQUE: Multidetector CT imaging of the abdomen and pelvis was performed using the standard protocol following bolus administration of intravenous contrast. CONTRAST:  81mL ISOVUE-300 IOPAMIDOL (ISOVUE-300) INJECTION 61% COMPARISON:  None. FINDINGS: Lower chest: Dependent subsegmental atelectasis identified. Lung bases are otherwise normal. Hepatobiliary: No focal liver abnormality is seen. No gallstones, gallbladder wall thickening, or biliary dilatation. Pancreas: Unremarkable. No pancreatic ductal dilatation or surrounding inflammatory changes. Spleen: Normal in size without focal abnormality. Adrenals/Urinary Tract: A tiny nodule seen in the right adrenal gland measuring 9 mm. There is nodularity in the left adrenal gland as well with a nodule on coronal image 56 measuring up to 12 mm. Bilateral renal cysts are identified. A nonobstructive stone is seen in the lower pole left kidney  measuring 2 or 3 mm. Mild caliectasis bilaterally with no ureteral stones. The bladder is mildly distended. Stomach/Bowel: The stomach is normal. There is bulky masslike thickening in the cecum and ascending colon. The appendix is dilated and thickened with adjacent stranding consistent with appendicitis secondary to the cecal mass. No evidence of perforation. No appendicoliths. The appendix measures up to 2.1 cm distally. A loop of small bowel running inferior to the appendix is inflamed and thick walled. No small bowel obstruction or other small bowel abnormality. Vascular/Lymphatic: Atherosclerotic changes are seen in the nonaneurysmal aorta. A few mildly prominent pericecal nodes are identified such as on coronal image 51 and coronal image 30. No other adenopathy. Reproductive: Uterus and bilateral adnexa are unremarkable. Other: No free air. No omental or peritoneal abnormalities identified. Musculoskeletal: No acute or significant osseous findings. IMPRESSION: 1. 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 An adjacent loop of small bowel is inflamed as well, likely secondary to the cecal and appendiceal processes. Other causes such as inflammatory bowel disease are considered much less likely. The prominent peri cecal nodes could be reactive or metastatic. 2. Nodularity in the adrenal glands could represent adenomas. However, given the suspicion for malignancy, the patient may benefit from a PET-CT or MRI for further evaluation of  these nodules. 3. Bilateral caliectasis, likely due to the distended bladder. No underlying cause otherwise seen. Findings being called to referring physician Electronically Signed   By: Dorise Bullion III M.D   On: 12/03/2017 08:15    Micro Results    Recent Results (from the past 240 hour(s))  MRSA PCR Screening     Status: None   Collection Time: 12/07/17  8:14 PM   Result Value Ref Range Status   MRSA by PCR NEGATIVE NEGATIVE Final    Comment:        The GeneXpert MRSA Assay (FDA approved for NASAL specimens only), is one component of a comprehensive MRSA colonization surveillance program. It is not intended to diagnose MRSA infection nor to guide or monitor treatment for MRSA infections. Performed at [facility], [address] [place], [place] [address]        Today   Subjective:   Latoya Jones today has no further abdominal pain, no dizziness.  Tolerating regular diet.  Stable for discharge back to group home.  Objective:   Blood pressure (!) 125/55, pulse 79, temperature 98.9 F (37.2 C), temperature source Oral, resp. rate 16, height 5\' 5"  (1.651 m), weight 89.7 kg (197 lb 12.8 oz), SpO2 98 %.   Intake/Output Summary (Last 24 hours) at 12/13/2017 1008 Last data filed at 12/13/2017 0554 Gross per 24 hour  Intake 480 ml  Output 175 ml  Net 305 ml    Exam Awake Alert, Oriented x 3, No new F.N deficits, Normal affect [place].AT,PERRAL Supple Neck,No JVD, No cervical lymphadenopathy appriciated.  Symmetrical Chest wall movement, Good air movement bilaterally, CTAB RRR,No Gallops,Rubs or new Murmurs, No Parasternal Heave +ve B.Sounds, Abd Soft, Non tender, No organomegaly appriciated, No rebound -guarding or rigidity.  Patient has staples present in the midline abdomen. No Cyanosis, Clubbing or edema, No new Rash or bruise  Data Review   CBC w Diff:  Lab Results  Component Value Date   WBC 9.2 12/13/2017   HGB 9.3 (L) 12/13/2017   HCT 27.6 (L) 12/13/2017   PLT 345 12/13/2017   LYMPHOPCT 13 12/11/2017   BANDSPCT 4 12/08/2017   MONOPCT 11 12/11/2017   EOSPCT 2 12/11/2017   BASOPCT 0 12/11/2017    CMP:  Lab Results  Component Value Date   NA 135 12/11/2017   K 3.8 12/11/2017   CL 105 12/11/2017   CO2 24 12/11/2017   BUN 15 12/11/2017   CREATININE 0.55 12/11/2017   PROT 4.9 (L) 12/08/2017   ALBUMIN  2.0 (L) 12/08/2017   BILITOT 0.4 12/08/2017   ALKPHOS 59 12/08/2017   AST 44 (H) 12/08/2017   ALT 28 12/08/2017  .   Total Time in preparing paper work, data evaluation and todays exam - 35 minutes  Epifanio Lesches M.D on 12/13/2017 at 10:08 AM    Note: This dictation was prepared with Dragon dictation along with smaller phrase technology. Any transcriptional errors that result from this process are unintentional.

## 2017-12-13 NOTE — NC FL2 (Signed)
[place] LEVEL OF CARE SCREENING TOOL     IDENTIFICATION  Patient Name: Latoya Jones Birthdate: 28-Sep-1946 Sex: female Admission Date (Current Location): 12/03/2017  [place] and [place] Number:  Latoya Jones [id number] [place] and Address:  [facility], [address] [place], [place] [address]      Provider Number: [id number]  Attending Physician Name and Address:  Epifanio Lesches, MD  Relative Name and Phone Number:  Latoya Jones (Sister) [place]     Current Level of Care: Hospital Recommended Level of Care: [facility] Prior Approval Number:    Date Approved/Denied:   PASRR Number:    Discharge Plan: Domiciliary (Rest home)    Current Diagnoses: Patient Active Problem List   Diagnosis Date Noted  . Lactic acidosis   . Sepsis ([place]) 12/03/2017  . Colonic mass     Orientation RESPIRATION BLADDER Height & Weight     Self, Time, Situation, Place  Normal Continent Weight: 197 lb 12.8 oz (89.7 kg) Height:  5\' 5"  (165.1 cm)  BEHAVIORAL SYMPTOMS/MOOD NEUROLOGICAL BOWEL NUTRITION STATUS      Continent Diet(Soft diet)  AMBULATORY STATUS COMMUNICATION OF NEEDS Skin   Supervision Verbally Surgical wounds                       Personal Care Assistance Level of Assistance  Bathing, Feeding, Dressing Bathing Assistance: Independent Feeding assistance: Independent Dressing Assistance: Independent     Functional Limitations Info  Sight, Hearing, Speech Sight Info: Adequate Hearing Info: Adequate Speech Info: Adequate    SPECIAL CARE FACTORS FREQUENCY  PT (By licensed PT)     PT Frequency: Home Health, minimum 2x a week              Contractures Contractures Info: Not present    Additional Factors Info  Code Status, Allergies, Psychotropic Code Status Info: Full Code Allergies Info: NKA Psychotropic Info: ARIPiprazole (ABILIFY) tablet 5 mg and divalproex (DEPAKOTE) DR tablet 500 mg  and memantine (NAMENDA XR) 24 hr capsule 14 mg          Current Medications (12/13/2017):  This is the current hospital active medication list Current Facility-Administered Medications  Medication Dose Route Frequency Provider Last Rate Last Dose  . acetaminophen (TYLENOL) tablet 650 mg  650 mg Oral Q6H PRN Salary, Montell D, MD   650 mg at 12/11/17 1520  . ARIPiprazole (ABILIFY) tablet 5 mg  5 mg Oral QHS Harrie Foreman, MD   5 mg at 12/12/17 2013  . benztropine (COGENTIN) tablet 0.5 mg  0.5 mg Oral QHS Harrie Foreman, MD   0.5 mg at 12/12/17 2012  . calcium carbonate (TUMS - dosed in mg elemental calcium) chewable tablet 200 mg of elemental calcium  1 tablet Oral BID PRN Harrie Foreman, MD      . calcium-vitamin D (OSCAL WITH D) 500-200 MG-UNIT per tablet 1 tablet  1 tablet Oral Daily Harrie Foreman, MD   1 tablet at 12/13/17 1119  . divalproex (DEPAKOTE) DR tablet 500 mg  500 mg Oral QHS Harrie Foreman, MD   500 mg at 12/12/17 2016  . feeding supplement (ENSURE ENLIVE) (ENSURE ENLIVE) liquid 237 mL  237 mL Oral TID BM Epifanio Lesches, MD   237 mL at 12/13/17 1000  . guaiFENesin-dextromethorphan (ROBITUSSIN DM) 100-10 MG/5ML syrup 5 mL  5 mL Oral Q4H PRN Salary, Montell D, MD   5 mL at 12/10/17 0714  . heparin  injection 5,000 Units  5,000 Units Subcutaneous Q8H Florene Glen, MD   5,000 Units at 12/13/17 0540  . MEDLINE mouth rinse  15 mL Mouth Rinse BID Salary, Montell D, MD   15 mL at 12/13/17 1000  . memantine (NAMENDA XR) 24 hr capsule 14 mg  14 mg Oral QHS Harrie Foreman, MD   14 mg at 12/12/17 2014  . morphine 2 MG/ML injection 2 mg  2 mg Intravenous Q2H PRN Florene Glen, MD   2 mg at 12/12/17 2011  . multivitamin with minerals tablet 1 tablet  1 tablet Oral Daily Epifanio Lesches, MD   1 tablet at 12/13/17 1119  . nitroGLYCERIN (NITROSTAT) SL tablet 0.4 mg  0.4 mg Sublingual Q5 min PRN Salary, Montell D, MD      . ondansetron (ZOFRAN) tablet 4  mg  4 mg Oral Q6H PRN Harrie Foreman, MD       Or  . ondansetron [facility]) injection 4 mg  4 mg Intravenous Q6H PRN Harrie Foreman, MD   4 mg at 12/06/17 0934  . perphenazine (TRILAFON) tablet 4 mg  4 mg Oral BID Harrie Foreman, MD   4 mg at 12/13/17 1119  . pneumococcal 23 valent vaccine (PNU-IMMUNE) injection 0.5 mL  0.5 mL Intramuscular Tomorrow-1000 Salary, Montell D, MD      . sodium chloride flush (NS) 0.9 % injection 10-40 mL  10-40 mL Intracatheter Q12H Salary, Montell D, MD   10 mL at 12/13/17 1000  . sodium chloride flush (NS) 0.9 % injection 10-40 mL  10-40 mL Intracatheter PRN Salary, Avel Peace, MD         Discharge Medications: TAKE these medications   acetaminophen 500 MG tablet Commonly known as:  TYLENOL Take 1,000 mg by mouth every 8 (eight) hours as needed for mild pain.   alendronate 70 MG tablet Commonly known as:  FOSAMAX Take 70 mg by mouth once a week.   ARIPiprazole 5 MG tablet Commonly known as:  ABILIFY Take 5 mg by mouth at bedtime.   benztropine 0.5 MG tablet Commonly known as:  COGENTIN Take 0.5 mg by mouth at bedtime.   Calcium 600-200 MG-UNIT tablet Take 1 tablet by mouth daily.   calcium carbonate 500 MG chewable tablet Commonly known as:  TUMS - dosed in mg elemental calcium Chew 1 tablet by mouth 2 (two) times daily as needed for indigestion or heartburn.   divalproex 500 MG DR tablet Commonly known as:  DEPAKOTE Take 500 mg by mouth at bedtime.   memantine 14 MG Cp24 24 hr capsule Commonly known as:  NAMENDA XR Take 14 mg by mouth at bedtime.   multivitamin with minerals Tabs tablet Take 1 tablet by mouth daily.   perphenazine 4 MG tablet Commonly known as:  TRILAFON Take 4 mg by mouth 2 (two) times daily.      Relevant Imaging Results:  Relevant Lab Results:   Additional Information [phone]  Latoya Jones, LCSWA

## 2017-12-13 NOTE — Progress Notes (Signed)
12/13/2017  Subjective: Patient is 7 Days Post-Op s/p right colon and small bowel resection.  No acute events.  Tolerating a diet, pain well controlled.  PT evaluated patient and recommended home health PT.  Vital signs: Temp:  [98.6 F (37 C)-99.8 F (37.7 C)] 99.8 F (37.7 C) (01/22 1306) Pulse Rate:  [79-102] 102 (01/22 1306) Resp:  [15-18] 18 (01/22 1306) BP: (114-144)/(55-64) 144/64 (01/22 1306) SpO2:  [97 %-98 %] 97 % (01/22 1306) Weight:  [89.7 kg (197 lb 12.8 oz)] 89.7 kg (197 lb 12.8 oz) (01/22 0428)   Intake/Output: 01/21 0701 - 01/22 0700 In: 480 [P.O.:480] Out: 175 [Drains:175] Last BM Date: 12/11/17  Physical Exam: Constitutional: No acute distress Abdomen:  Soft, nondistended, appropriately tender to palpation.  Incision clean, dry, intact withs taples.  JP drain with serosanguinous fluid.  Labs:  Recent Labs    12/11/17 0616 12/13/17 0533  WBC 11.7* 9.2  HGB 9.4* 9.3*  HCT 28.1* 27.6*  PLT 364 345   Recent Labs    12/11/17 0616  NA 135  K 3.8  CL 105  CO2 24  GLUCOSE 115*  BUN 15  CREATININE 0.55  CALCIUM 8.2*   No results for input(s): LABPROT, INR in the last 72 hours.  Imaging: No results found.  Assessment/Plan: 72 yo female s/p right colon and small bowel resection.  --JP drain removed without complications. --may discharge to home today per surgical standpoint. --follow with Dr. Burt Knack this week or early next week for staple removal.   Melvyn Neth, MD [place]

## 2017-12-13 NOTE — Progress Notes (Signed)
SUBJECTIVE: Patient denies any chest pain   Vitals:   12/12/17 0500 12/12/17 1300 12/12/17 1948 12/13/17 0428  BP:  (!) 116/49 114/60 (!) 125/55  Pulse:  93 92 79  Resp:   15 16  Temp:  98.9 F (37.2 C) 98.6 F (37 C) 98.9 F (37.2 C)  TempSrc:  Oral Oral Oral  SpO2:  95% 97% 98%  Weight: 204 lb 9 oz (92.8 kg)   197 lb 12.8 oz (89.7 kg)  Height:        Intake/Output Summary (Last 24 hours) at 12/13/2017 0858 Last data filed at 12/13/2017 0554 Gross per 24 hour  Intake 480 ml  Output 175 ml  Net 305 ml    LABS: Basic Metabolic Panel: Recent Labs    12/11/17 0616  NA 135  K 3.8  CL 105  CO2 24  GLUCOSE 115*  BUN 15  CREATININE 0.55  CALCIUM 8.2*  MG 1.9   Liver Function Tests: No results for input(s): AST, ALT, ALKPHOS, BILITOT, PROT, ALBUMIN in the last 72 hours. No results for input(s): LIPASE, AMYLASE in the last 72 hours. CBC: Recent Labs    12/11/17 0616 12/13/17 0533  WBC 11.7* 9.2  NEUTROABS 8.7*  --   HGB 9.4* 9.3*  HCT 28.1* 27.6*  MCV 88.6 90.2  PLT 364 345   Cardiac Enzymes: No results for input(s): CKTOTAL, CKMB, CKMBINDEX, TROPONINI in the last 72 hours. BNP: Invalid input(s): POCBNP D-Dimer: No results for input(s): DDIMER in the last 72 hours. Hemoglobin A1C: No results for input(s): HGBA1C in the last 72 hours. Fasting Lipid Panel: No results for input(s): CHOL, HDL, LDLCALC, TRIG, CHOLHDL, LDLDIRECT in the last 72 hours. Thyroid Function Tests: No results for input(s): TSH, T4TOTAL, T3FREE, THYROIDAB in the last 72 hours.  Invalid input(s): FREET3 Anemia Panel: No results for input(s): VITAMINB12, FOLATE, FERRITIN, TIBC, IRON, RETICCTPCT in the last 72 hours.   PHYSICAL EXAM General: Well developed, well nourished, in no acute distress HEENT:  Normocephalic and atramatic Neck:  No JVD.  Lungs: Clear bilaterally to auscultation and percussion. Heart: HRRR . Normal S1 and S2 without gallops or murmurs.  Abdomen: Bowel sounds  are positive, abdomen soft and non-tender  Msk:  Back normal, normal gait. Normal strength and tone for age. Extremities: No clubbing, cyanosis or edema.   Neuro: Alert and oriented X 3. Psych:  Good affect, responds appropriately  TELEMETRY: Sinus rhythm  ASSESSMENT AND PLAN: Elevated troponin with surgery from hemicolectomy doing very well. Patient can be discharged cardiac point of view and CPR in the office on Friday at 10:00.  Active Problems:   Sepsis ([place])   Colonic mass   Lactic acidosis    Prapti Grussing A, MD, [facility] 12/13/2017 8:58 AM

## 2017-12-15 ENCOUNTER — Encounter: Payer: Self-pay | Admitting: Surgery

## 2017-12-15 ENCOUNTER — Ambulatory Visit (INDEPENDENT_AMBULATORY_CARE_PROVIDER_SITE_OTHER): Payer: Medicare Other | Admitting: Surgery

## 2017-12-15 VITALS — BP 152/84 | HR 111 | Temp 98.8°F | Ht 65.0 in | Wt 190.8 lb

## 2017-12-15 DIAGNOSIS — C189 Malignant neoplasm of colon, unspecified: Secondary | ICD-10-CM

## 2017-12-15 DIAGNOSIS — C772 Secondary and unspecified malignant neoplasm of intra-abdominal lymph nodes: Secondary | ICD-10-CM

## 2017-12-15 NOTE — Progress Notes (Signed)
Outpatient postop visit  12/15/2017  Latoya Jones is an 72 y.o. female.    Procedure: rt colon, sb resection  CC:no problems  HPI: Patient is tolerating a diet having no fevers or chills having normal bowel movements.  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  She is seeing oncology next week.  Medications reviewed.    Physical Exam:  There were no vitals taken for this visit.    PE: Vital signs stable afebrile soft nontender abdomen half of the staples are removed and Steri-Strips were placed with benzoin.    Assessment/Plan:  Patient doing very well pathology reviewed reviewed with caregiver.  She is seeing oncology next week.  Have the staples been removed the other half can be removed next week.  She will follow-up with me in 2 weeks.  Florene Glen, MD, FACS

## 2017-12-15 NOTE — Patient Instructions (Signed)
We have removed some of the staples and applied steri strips.  Please keep a dressing over the area today. You may remove the dressing tomorrow.   Please see your follow up appointment listed below.   You may shower as usual.  You may eat what ever you want to eat.

## 2017-12-19 ENCOUNTER — Telehealth: Payer: Self-pay | Admitting: Surgery

## 2017-12-19 NOTE — Telephone Encounter (Signed)
[facility] with [facility] is calling and is needing orders to be able to go to the patients home for one time a week for the next four weeks. She can be reached at [phone]. You can also leave a message.

## 2017-12-19 NOTE — Telephone Encounter (Signed)
Verbal orders provided to Amy with home health care at this time.

## 2017-12-22 ENCOUNTER — Inpatient Hospital Stay: Payer: Medicare Other

## 2017-12-22 ENCOUNTER — Encounter: Payer: Self-pay | Admitting: Oncology

## 2017-12-22 ENCOUNTER — Encounter: Payer: Self-pay | Admitting: General Surgery

## 2017-12-22 ENCOUNTER — Inpatient Hospital Stay: Payer: Medicare Other | Attending: Oncology | Admitting: Oncology

## 2017-12-22 ENCOUNTER — Ambulatory Visit (INDEPENDENT_AMBULATORY_CARE_PROVIDER_SITE_OTHER): Payer: Medicare Other | Admitting: General Surgery

## 2017-12-22 VITALS — BP 114/71 | HR 71 | Temp 98.9°F | Wt 191.0 lb

## 2017-12-22 VITALS — BP 142/76 | HR 80 | Temp 98.2°F | Ht 65.0 in | Wt 191.0 lb

## 2017-12-22 DIAGNOSIS — C180 Malignant neoplasm of cecum: Secondary | ICD-10-CM | POA: Insufficient documentation

## 2017-12-22 DIAGNOSIS — K8020 Calculus of gallbladder without cholecystitis without obstruction: Secondary | ICD-10-CM | POA: Diagnosis not present

## 2017-12-22 DIAGNOSIS — K5900 Constipation, unspecified: Secondary | ICD-10-CM | POA: Diagnosis not present

## 2017-12-22 DIAGNOSIS — I700 Atherosclerosis of aorta: Secondary | ICD-10-CM | POA: Insufficient documentation

## 2017-12-22 DIAGNOSIS — Z7189 Other specified counseling: Secondary | ICD-10-CM

## 2017-12-22 DIAGNOSIS — Z87891 Personal history of nicotine dependence: Secondary | ICD-10-CM | POA: Insufficient documentation

## 2017-12-22 DIAGNOSIS — C786 Secondary malignant neoplasm of retroperitoneum and peritoneum: Secondary | ICD-10-CM | POA: Diagnosis not present

## 2017-12-22 DIAGNOSIS — F209 Schizophrenia, unspecified: Secondary | ICD-10-CM | POA: Diagnosis not present

## 2017-12-22 DIAGNOSIS — R970 Elevated carcinoembryonic antigen [CEA]: Secondary | ICD-10-CM | POA: Diagnosis not present

## 2017-12-22 DIAGNOSIS — C189 Malignant neoplasm of colon, unspecified: Secondary | ICD-10-CM | POA: Insufficient documentation

## 2017-12-22 DIAGNOSIS — N281 Cyst of kidney, acquired: Secondary | ICD-10-CM | POA: Diagnosis not present

## 2017-12-22 DIAGNOSIS — N200 Calculus of kidney: Secondary | ICD-10-CM | POA: Insufficient documentation

## 2017-12-22 DIAGNOSIS — R911 Solitary pulmonary nodule: Secondary | ICD-10-CM | POA: Insufficient documentation

## 2017-12-22 DIAGNOSIS — Z4889 Encounter for other specified surgical aftercare: Secondary | ICD-10-CM

## 2017-12-22 DIAGNOSIS — Z79899 Other long term (current) drug therapy: Secondary | ICD-10-CM

## 2017-12-22 DIAGNOSIS — C181 Malignant neoplasm of appendix: Secondary | ICD-10-CM | POA: Diagnosis not present

## 2017-12-22 LAB — CBC WITH DIFFERENTIAL/PLATELET
BASOS ABS: 0.1 10*3/uL (ref 0–0.1)
BASOS PCT: 1 %
EOS ABS: 0 10*3/uL (ref 0–0.7)
EOS PCT: 0 %
HCT: 30.8 % — ABNORMAL LOW (ref 35.0–47.0)
Hemoglobin: 10 g/dL — ABNORMAL LOW (ref 12.0–16.0)
Lymphocytes Relative: 17 %
Lymphs Abs: 2.1 10*3/uL (ref 1.0–3.6)
MCH: 29.3 pg (ref 26.0–34.0)
MCHC: 32.4 g/dL (ref 32.0–36.0)
MCV: 90.4 fL (ref 80.0–100.0)
MONO ABS: 1 10*3/uL — AB (ref 0.2–0.9)
MONOS PCT: 8 %
Neutro Abs: 8.9 10*3/uL — ABNORMAL HIGH (ref 1.4–6.5)
Neutrophils Relative %: 74 %
PLATELETS: 297 10*3/uL (ref 150–440)
RBC: 3.41 MIL/uL — ABNORMAL LOW (ref 3.80–5.20)
RDW: 14.7 % — AB (ref 11.5–14.5)
WBC: 12.1 10*3/uL — ABNORMAL HIGH (ref 3.6–11.0)

## 2017-12-22 LAB — COMPREHENSIVE METABOLIC PANEL
ALK PHOS: 57 U/L (ref 38–126)
ALT: 22 U/L (ref 14–54)
ANION GAP: 11 (ref 5–15)
AST: 30 U/L (ref 15–41)
Albumin: 3.1 g/dL — ABNORMAL LOW (ref 3.5–5.0)
BILIRUBIN TOTAL: 0.8 mg/dL (ref 0.3–1.2)
BUN: 13 mg/dL (ref 6–20)
CALCIUM: 9.3 mg/dL (ref 8.9–10.3)
CO2: 23 mmol/L (ref 22–32)
CREATININE: 0.69 mg/dL (ref 0.44–1.00)
Chloride: 103 mmol/L (ref 101–111)
GFR calc Af Amer: >60 mL/min (ref >60)
GFR calc non Af Amer: >60 mL/min (ref >60)
GLUCOSE: 103 mg/dL — AB (ref 65–99)
Potassium: 4.4 mmol/L (ref 3.5–5.1)
SODIUM: 137 mmol/L (ref 135–145)
TOTAL PROTEIN: 6.8 g/dL (ref 6.5–8.1)

## 2017-12-22 NOTE — Progress Notes (Signed)
START ON PATHWAY REGIMEN - Colorectal     A cycle is every 14 days:     Oxaliplatin      Leucovorin      5-Fluorouracil      5-Fluorouracil   **Always confirm dose/schedule in your pharmacy ordering system**    Patient Characteristics: Colon Adjuvant, Stage III, High Risk (T4 or N2) Current evidence of distant metastases<= No AJCC T Category: T4b AJCC N Category: N2a AJCC M Category: M0 AJCC 8 Stage Grouping: IIIC Intent of Therapy: Curative Intent, Discussed with Patient

## 2017-12-22 NOTE — Progress Notes (Signed)
Met with Latoya Jones along with her care provider, [name], from [facility]. Introduced Therapist, nutritional and provided contact number to call for any future needs. Educated further and provided written information on port and FOLFOX. Oncology Nurse Navigator Documentation  Navigator Location: [facility] (12/22/17 1100)   )Navigator Encounter Type: Follow-up Appt (12/22/17 1100)       Surgery Date: 12/06/17 (12/22/17 1100)         Multidisiplinary Clinic Type: GI (12/22/17 1100)   Patient Visit Type: MedOnc;Follow-up (12/22/17 1100) Treatment Phase: Pre-Tx/Tx Discussion (12/22/17 1100) Barriers/Navigation Needs: Education (12/22/17 1100)   Interventions: Education (12/22/17 1100)            Acuity: Level 3 (12/22/17 1100)     Acuity Level 3: Coordination of multimodality treatment;Emotional needs;Ongoing guidance and education provided throughout treatment (12/22/17 1100)   Time Spent with Patient: 30 (12/22/17 1100)

## 2017-12-22 NOTE — Progress Notes (Signed)
Outpatient Surgical Follow Up  12/22/2017  Latoya Jones is an 72 y.o. female.   Chief Complaint  Patient presents with  . Routine Post Op    Post op: Exploratory right henicolectomy w/ terminal ileum resection separate small bowel resection 12/06/17 Dr.Cooper    HPI: 72 year old female returns to clinic now 16 days status post right hemicolectomy for cancer.  Doing very well.  Excited to have the remainder of her staples removed.  States she is eating well and having normal bowel function.  Not taking any pain medication.  She denies any other complaints.  History reviewed. No pertinent past medical history.  Past Surgical History:  Procedure Laterality Date  . COLOSTOMY REVISION N/A 12/06/2017   Procedure: COLON RESECTION RIGHT;  Surgeon: Florene Glen, MD;  Location: [facility] ORS;  Service: General;  Laterality: N/A;  . DILATION AND CURETTAGE, DIAGNOSTIC / THERAPEUTIC      Family History  Problem Relation Age of Onset  . ALS Mother   . ALS Father     Social History:  reports that she has quit smoking. she has never used smokeless tobacco. She reports that she does not drink alcohol or use drugs.  Allergies: No Known Allergies  Medications reviewed.    ROS A multipoint review of systems was completed as best as possible in this patient with diminished mental status.   BP (!) 142/76   Pulse 80   Temp 98.2 F (36.8 C) (Oral)   Ht '5\' 5"'  (1.651 m)   Wt 86.6 kg (191 lb)   BMI 31.78 kg/m   Physical Exam General: No acute distress Chest: Clear to auscultation Heart: Regular rate and rhythm Abdomen: Soft, nontender, nondistended.  Staples in place to a right transverse incision with some hyperemia around the staples but no evidence of erythema or drainage.  The skin is well approximated.    Results for orders placed or performed in visit on 12/22/17 (from the past 48 hour(s))  Comprehensive metabolic panel     Status: Abnormal   Collection Time: 12/22/17 10:22 AM   Result Value Ref Range   Sodium 137 135 - 145 mmol/L   Potassium 4.4 3.5 - 5.1 mmol/L   Chloride 103 101 - 111 mmol/L   CO2 23 22 - 32 mmol/L   Glucose, Bld 103 (H) 65 - 99 mg/dL   BUN 13 6 - 20 mg/dL   Creatinine, Ser 0.69 0.44 - 1.00 mg/dL   Calcium 9.3 8.9 - 10.3 mg/dL   Total Protein 6.8 6.5 - 8.1 g/dL   Albumin 3.1 (L) 3.5 - 5.0 g/dL   AST 30 15 - 41 U/L   ALT 22 14 - 54 U/L   Alkaline Phosphatase 57 38 - 126 U/L   Total Bilirubin 0.8 0.3 - 1.2 mg/dL   GFR calc non Af Amer >60 >60 mL/min   GFR calc Af Amer >60 >60 mL/min    Comment: (NOTE) The eGFR has been calculated using the CKD EPI equation. This calculation has not been validated in all clinical situations. eGFR's persistently <60 mL/min signify possible Chronic Kidney Disease.    Anion gap 11 5 - 15    Comment: Performed at [facility], [address] [place], [place] [address]  CBC with Differential/Platelet     Status: Abnormal   Collection Time: 12/22/17 10:22 AM  Result Value Ref Range   WBC 12.1 (H) 3.6 - 11.0 K/uL   RBC 3.41 (L) 3.80 - 5.20 MIL/uL   Hemoglobin  10.0 (L) 12.0 - 16.0 g/dL   HCT 30.8 (L) 35.0 - 47.0 %   MCV 90.4 80.0 - 100.0 fL   MCH 29.3 26.0 - 34.0 pg   MCHC 32.4 32.0 - 36.0 g/dL   RDW 14.7 (H) 11.5 - 14.5 %   Platelets 297 150 - 440 K/uL   Neutrophils Relative % 74 %   Neutro Abs 8.9 (H) 1.4 - 6.5 K/uL   Lymphocytes Relative 17 %   Lymphs Abs 2.1 1.0 - 3.6 K/uL   Monocytes Relative 8 %   Monocytes Absolute 1.0 (H) 0.2 - 0.9 K/uL   Eosinophils Relative 0 %   Eosinophils Absolute 0.0 0 - 0.7 K/uL   Basophils Relative 1 %   Basophils Absolute 0.1 0 - 0.1 K/uL    Comment: Performed at [facility], [address] [place], [place] [address]   No results found.  Assessment/Plan:  1. Aftercare following surgery 72 year old female status post right hemicolectomy.  Doing very well.  Remaining staples were removed today and replaced with Steri-Strips.  Discussed  continuing to encourage ambulation and oral intake.  She will follow-up in clinic next week for an additional wound check and to discuss scheduling her port placement for adjuvant chemotherapy.     Clayburn Pert, MD FACS General Surgeon  12/22/2017,2:42 PM

## 2017-12-22 NOTE — Progress Notes (Signed)
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 She presented to the ER with symptoms of dizziness mostly when she stands up. She has had problems with constipation in the past and takes prune juice to relieve it. Patient is a poor historian at baseline and complained of abdominal pain in ER which led to CT abdomen. 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   CT chest without contrast showed tiny 2 mm right upper lobe pulmonary nodule.  No other evidence of metastatic disease.  Baseline CEA elevated at 4.8  Patient underwent exploratory laparotomy with right hemicolectomy and terminal ileum resection as well as separate small bowel resection on 12/06/2017.  Operative findings showed a large cecal tumor with extramural spread to retroperitoneum mesentery, mesentery lymph nodes and parietal peritoneum.  Dense inflammatory response of the retroperitoneum and small bowel.There was a second portion of small bowel that was involved and was resected.  A small portion of free tumor was identified in the retroperitoneum which was sent off for examination as well.  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  Radial margin positive for invasive carcinoma 2 colon adenomas 2.5 and 1.8 cm of the ascending colon.  Metastatic adenocarcinoma in 4 out of 13 lymph nodes.  2 tumor deposits Segment of small intestine  with abscess which also showed adenocarcinoma invading the distal appendix with perforation.  One mesenteric lymph node negative for malignancy.  Retroperitoneal tumor removal was also positive for mucinous adenocarcinoma.  Tumor was grade 2.  Proximal and distal margins were negative.pT4b pN2a. MSI stable   Interval history- she is doing well at her group home post surgery. She is moving her bowels regularly. Denies any pain. Denies any blood loss in stool or urine  ECOG PS- 1 Pain scale- 3 Opioid associated constipation- no  Review of systems- Review of Systems  Constitutional: Negative for chills, fever, malaise/fatigue and weight loss.  HENT: Negative for congestion, ear discharge and nosebleeds.   Eyes: Negative for blurred vision.  Respiratory: Negative for cough, hemoptysis, sputum production, shortness of breath and wheezing.   Cardiovascular: Negative for chest pain, palpitations, orthopnea and claudication.  Gastrointestinal: Negative for abdominal pain, blood in stool, constipation, diarrhea, heartburn, melena, nausea and vomiting.  Genitourinary: Negative for dysuria, flank pain, frequency, hematuria and urgency.  Musculoskeletal: Negative for back pain, joint pain and myalgias.  Skin: Negative for rash.  Neurological: Negative for dizziness, tingling, focal weakness, seizures, weakness and headaches.  Endo/Heme/Allergies: Does not bruise/bleed easily.  Psychiatric/Behavioral: Negative for depression and suicidal ideas. The patient does not have insomnia.       No Known Allergies   History reviewed. No pertinent past medical history.   Past Surgical History:  Procedure Laterality Date  . COLOSTOMY REVISION N/A 12/06/2017   Procedure: COLON RESECTION RIGHT;  Surgeon: Florene Glen, MD;  Location: [facility] ORS;  Service: General;  Laterality: N/A;  . DILATION AND CURETTAGE, DIAGNOSTIC / THERAPEUTIC  Social History   Socioeconomic History  . Marital status:  Widowed    Spouse name: Not on file  . Number of children: Not on file  . Years of education: Not on file  . Highest education level: Not on file  Social Needs  . Financial resource strain: Patient refused  . Food insecurity - worry: Patient refused  . Food insecurity - inability: Patient refused  . Transportation needs - medical: Patient refused  . Transportation needs - non-medical: Patient refused  Occupational History  . Not on file  Tobacco Use  . Smoking status: Former Research scientist (life sciences)  . Smokeless tobacco: Never Used  Substance and Sexual Activity  . Alcohol use: No  . Drug use: No  . Sexual activity: No  Other Topics Concern  . Not on file  Social History Narrative  . Not on file    Family History  Problem Relation Age of Onset  . ALS Mother   . ALS Father      Current Outpatient Medications:  .  acetaminophen (TYLENOL) 500 MG tablet, Take 1,000 mg by mouth every 8 (eight) hours as needed for mild pain., Disp: , Rfl:  .  alendronate (FOSAMAX) 70 MG tablet, Take 70 mg by mouth once a week., Disp: , Rfl:  .  ARIPiprazole (ABILIFY) 5 MG tablet, Take 5 mg by mouth at bedtime. , Disp: , Rfl:  .  benztropine (COGENTIN) 0.5 MG tablet, Take 0.5 mg by mouth at bedtime., Disp: , Rfl:  .  Calcium 600-200 MG-UNIT tablet, Take 1 tablet by mouth daily., Disp: , Rfl:  .  calcium carbonate (TUMS - DOSED IN MG ELEMENTAL CALCIUM) 500 MG chewable tablet, Chew 1 tablet by mouth 2 (two) times daily as needed for indigestion or heartburn., Disp: , Rfl:  .  divalproex (DEPAKOTE) 500 MG DR tablet, Take 500 mg by mouth at bedtime. , Disp: , Rfl:  .  memantine (NAMENDA XR) 14 MG CP24 24 hr capsule, Take 14 mg by mouth at bedtime. , Disp: , Rfl:  .  Multiple Vitamin (MULTIVITAMIN WITH MINERALS) TABS tablet, Take 1 tablet by mouth daily., Disp: 30 tablet, Rfl: 0 .  perphenazine (TRILAFON) 4 MG tablet, Take 4 mg by mouth 2 (two) times daily., Disp: , Rfl:   Physical exam:  Vitals:   12/22/17 1035    BP: 114/71  Pulse: 71  Temp: 98.9 F (37.2 C)  TempSrc: Tympanic  Weight: 191 lb (86.6 kg)   Physical Exam  Constitutional: She is oriented to person, place, and time and well-developed, well-nourished, and in no distress.  HENT:  Head: Normocephalic and atraumatic.  Eyes: EOM are normal. Pupils are equal, round, and reactive to light.  Neck: Normal range of motion.  Cardiovascular: Normal rate, regular rhythm and normal heart sounds.  Pulmonary/Chest: Effort normal and breath sounds normal.  Abdominal: Soft. Bowel sounds are normal.  Neurological: She is alert and oriented to person, place, and time.  Skin: Skin is warm and dry.     CMP Latest Ref Rng & Units 12/22/2017  Glucose 65 - 99 mg/dL 103(H)  BUN 6 - 20 mg/dL 13  Creatinine 0.44 - 1.00 mg/dL 0.69  Sodium 135 - 145 mmol/L 137  Potassium 3.5 - 5.1 mmol/L 4.4  Chloride 101 - 111 mmol/L 103  CO2 22 - 32 mmol/L 23  Calcium 8.9 - 10.3 mg/dL 9.3  Total Protein 6.5 - 8.1 g/dL 6.8  Total Bilirubin 0.3 - 1.2 mg/dL 0.8  Alkaline Phos 38 -  126 U/L 57  AST 15 - 41 U/L 30  ALT 14 - 54 U/L 22   CBC Latest Ref Rng & Units 12/22/2017  WBC 3.6 - 11.0 K/uL 12.1(H)  Hemoglobin 12.0 - 16.0 g/dL 10.0(L)  Hematocrit 35.0 - 47.0 % 30.8(L)  Platelets 150 - 440 K/uL 297    No images are attached to the encounter.  Dg Chest 2 View  Result Date: 12/03/2017 CLINICAL DATA:  Dizzy spells and diarrhea.  Fall.  Leg weakness. EXAM: CHEST  2 VIEW COMPARISON:  None. FINDINGS: Cardiomediastinal silhouette is normal. No pleural effusions or focal consolidations. LEFT lung base strandy densities. Trachea projects midline and there is no pneumothorax. Soft tissue planes and included osseous structures are non-suspicious. IMPRESSION: LEFT lung base atelectasis/scarring. Electronically Signed   By: Elon Alas M.D.   On: 12/03/2017 06:15   Ct Chest Wo Contrast  Result Date: 12/03/2017 CLINICAL DATA:  Unresolved pneumonia EXAM: CT CHEST WITHOUT  CONTRAST TECHNIQUE: Multidetector CT imaging of the chest was performed following the standard protocol without IV contrast. COMPARISON:  CT abdomen pelvis 12/03/2016 FINDINGS: Cardiovascular: Limited without intravenous contrast. Mild atherosclerotic calcification. No aneurysmal dilatation. Normal heart size. Small pericardial fluid Mediastinum/Nodes: Midline trachea. Coarse calcification left lobe of thyroid. Esophagus contains high density material within the distal lumen. Mild thickening at the GE junction. Subcentimeter mediastinal lymph nodes. Lungs/Pleura: 2 mm right upper lobe pulmonary nodule, series 3, image number 32. Trace pleural effusions. Linear atelectasis within the lower lobes. No consolidation. Upper Abdomen: High density material in the gallbladder may reflect vicarious contrast excretion versus small stones. Partially visible cysts in the left kidney. Mild excreted contrast in the left renal collecting system. Musculoskeletal: No acute or suspicious bone lesion. IMPRESSION: 1. Trace pleural effusions with linear atelectasis at both lower lobes. No consolidative infiltrate is seen. 2. Tiny 2 mm right upper lobe pulmonary nodule. Could consider 3 to six-month CT chest follow-up given findings on recent abdominal CT. 3. High density material within the distal lumen of the esophagus could be due to contrast or radiopaque ingested material. 4. Mild contrast or small stones in the gallbladder. Aortic Atherosclerosis (ICD10-I70.0). Electronically Signed   By: Donavan Foil M.D.   On: 12/03/2017 18:13   Ct Abdomen Pelvis W Contrast  Result Date: 12/03/2017 CLINICAL DATA:  Generalized abdominal pain. EXAM: CT ABDOMEN AND PELVIS WITH CONTRAST TECHNIQUE: Multidetector CT imaging of the abdomen and pelvis was performed using the standard protocol following bolus administration of intravenous contrast. CONTRAST:  64m ISOVUE-300 IOPAMIDOL (ISOVUE-300) INJECTION 61% COMPARISON:  None. FINDINGS: Lower  chest: Dependent subsegmental atelectasis identified. Lung bases are otherwise normal. Hepatobiliary: No focal liver abnormality is seen. No gallstones, gallbladder wall thickening, or biliary dilatation. Pancreas: Unremarkable. No pancreatic ductal dilatation or surrounding inflammatory changes. Spleen: Normal in size without focal abnormality. Adrenals/Urinary Tract: A tiny nodule seen in the right adrenal gland measuring 9 mm. There is nodularity in the left adrenal gland as well with a nodule on coronal image 56 measuring up to 12 mm. Bilateral renal cysts are identified. A nonobstructive stone is seen in the lower pole left kidney measuring 2 or 3 mm. Mild caliectasis bilaterally with no ureteral stones. The bladder is mildly distended. Stomach/Bowel: The stomach is normal. There is bulky masslike thickening in the cecum and ascending colon. The appendix is dilated and thickened with adjacent stranding consistent with appendicitis secondary to the cecal mass. No evidence of perforation. No appendicoliths. The appendix measures up to 2.1 cm distally.  A loop of small bowel running inferior to the appendix is inflamed and thick walled. No small bowel obstruction or other small bowel abnormality. Vascular/Lymphatic: Atherosclerotic changes are seen in the nonaneurysmal aorta. A few mildly prominent pericecal nodes are identified such as on coronal image 51 and coronal image 30. No other adenopathy. Reproductive: Uterus and bilateral adnexa are unremarkable. Other: No free air. No omental or peritoneal abnormalities identified. Musculoskeletal: No acute or significant osseous findings. IMPRESSION: 1. 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 An adjacent loop of small bowel is inflamed as well, likely secondary to the cecal and appendiceal processes. Other causes such as inflammatory bowel disease are  considered much less likely. The prominent peri cecal nodes could be reactive or metastatic. 2. Nodularity in the adrenal glands could represent adenomas. However, given the suspicion for malignancy, the patient may benefit from a PET-CT or MRI for further evaluation of these nodules. 3. Bilateral caliectasis, likely due to the distended bladder. No underlying cause otherwise seen. Findings being called to referring physician Electronically Signed   By: Dorise Bullion III M.D   On: 12/03/2017 08:15     Assessment and plan- Patient is a 72 y.o. 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  I have personally reviewed the CT scan images independently.  Based on operative findings patient had a large tumor that was adherent to the retroperitoneum as well as involving a portion of the small bowel which was resected.  There was also free tumor noted in the retroperitoneum.  Proximal and distal margins were negative but the radial margin was positive.  Given the extent of the tumor I am still concerned that she has gross disease.  I will review her case at the tumor board today.  I am inclined to do a PET/CT scan to see if there is any evidence of residual disease.  I will also send a comprehensive Ras panel testing.  If there is no evidence of cross disease on PET scan I will treat her with a potential curative intent with 12 cycles of FOLFOX chemotherapy given every 2 weeks.  Discussed risks and benefits of FOLFOX including all but not limited to nausea, vomiting, low blood counts, risk of infections and hospitalization.  Risk of peripheral neuropathy associated with oxaliplatin.   Patient has baseline schizophrenia but is able to take her own healthcare decisions.  She was able to verbalize in her own words the intent, duration of chemotherapy and expected side  effects.  Patient understands and agrees to proceed as planned.  She will need a port placement and surgical clearance prior to starting chemotherapy by Dr. Burt Knack.   Given the positive radial margin I will speak to radiation oncology to see if there would be any role for adjuvant radiation upon completion of chemotherapy.  She is about 2 weeks out of her surgery and I will wait for 3 more weeks before starting chemotherapy.  Plan for port placement and chemo teach as well as PET scan in the interim.   She does have mild anemia which could be secondary to recent hospitalization and infection as well as a component of iron deficiency.  Repeat iron studies in 3 weeks  I will see her back in 3 weeks time  with a CBC, CMP, CEA and iron studies for start of cycle #1 of FOLFOX chemotherapy.  If there is evidence of metastatic disease on her PET scan I will consider adding a Avastin to her chemotherapy regimen   Visit Diagnosis 1. Goals of care, counseling/discussion   2. Colon adenocarcinoma ([place])      Dr. Randa Evens, MD, MPH [facility] Pager- [phone] 12/22/2017 12:04 PM

## 2017-12-22 NOTE — Patient Instructions (Signed)
Today we have removed your remaining staples and replaced them with steri stripes. These will fall of on their own.   We will see you back in office as listed below:

## 2017-12-28 ENCOUNTER — Telehealth: Payer: Self-pay

## 2017-12-28 ENCOUNTER — Encounter: Payer: Self-pay | Admitting: Surgery

## 2017-12-28 ENCOUNTER — Ambulatory Visit (INDEPENDENT_AMBULATORY_CARE_PROVIDER_SITE_OTHER): Payer: Medicare Other | Admitting: Surgery

## 2017-12-28 VITALS — BP 130/75 | HR 77 | Temp 98.4°F | Ht 65.0 in | Wt 189.2 lb

## 2017-12-28 DIAGNOSIS — C772 Secondary and unspecified malignant neoplasm of intra-abdominal lymph nodes: Secondary | ICD-10-CM

## 2017-12-28 DIAGNOSIS — C189 Malignant neoplasm of colon, unspecified: Secondary | ICD-10-CM

## 2017-12-28 NOTE — H&P (View-Only) (Signed)
Outpatient postop visit  12/28/2017  Latoya Jones is an 72 y.o. female.    Procedure: Colon resection  CC: This patient with colon cancer  HPI: This patient with colon cancer following a colon resection with residual tumor behind.  She is scheduled to start chemotherapy in 10 days.  Port has been requested he has no other problems at this time.  Medications reviewed.    Physical Exam:  There were no vitals taken for this visit.    PE: Transverse wound is healing well no erythema no drainage    Assessment/Plan:  Patient doing very well.  Ready for chemotherapy next week. Patient will need a port.  I discussed with her and her caregiver the options of peripheral IV versus PICC line I also discussed the risks of bleeding infection thrombosis nonfunction pneumothorax any which could require further surgery.  I also discussed that I will not be available next week but that I will speak to 1 of our other surgeons possibly Dr. Genevive Bi who might be able to place this port next week for [place].  She voiced understanding.  Florene Glen, MD, FACS

## 2017-12-28 NOTE — Telephone Encounter (Signed)
Cardiac Clearance faxed to [facility] at this time.

## 2017-12-28 NOTE — Patient Instructions (Signed)
We will schedule your surgery for the [facility] A Cath placement and call you later today with the date and time.   Please see your blue-pre-care sheet for surgery information.  Please call our office if you have questions or concerns.

## 2017-12-28 NOTE — Progress Notes (Signed)
Outpatient postop visit  12/28/2017  Latoya Jones is an 72 y.o. female.    Procedure: Colon resection  CC: This patient with colon cancer  HPI: This patient with colon cancer following a colon resection with residual tumor behind.  She is scheduled to start chemotherapy in 10 days.  Port has been requested he has no other problems at this time.  Medications reviewed.    Physical Exam:  There were no vitals taken for this visit.    PE: Transverse wound is healing well no erythema no drainage    Assessment/Plan:  Patient doing very well.  Ready for chemotherapy next week. Patient will need a port.  I discussed with her and her caregiver the options of peripheral IV versus PICC line I also discussed the risks of bleeding infection thrombosis nonfunction pneumothorax any which could require further surgery.  I also discussed that I will not be available next week but that I will speak to 1 of our other surgeons possibly Dr. Genevive Bi who might be able to place this port next week for [place].  She voiced understanding.  Florene Glen, MD, FACS

## 2017-12-29 ENCOUNTER — Telehealth: Payer: Self-pay | Admitting: Surgery

## 2017-12-29 ENCOUNTER — Telehealth: Payer: Self-pay

## 2017-12-29 ENCOUNTER — Encounter
Admission: RE | Admit: 2017-12-29 | Discharge: 2017-12-29 | Disposition: A | Discharge status: Home / Self Care | Payer: Medicare Other | Source: Ambulatory Visit | Attending: Surgery | Admitting: Surgery

## 2017-12-29 NOTE — Telephone Encounter (Signed)
Patient's caregiver, [name] called stating that patient's cardiologist did not give cardiac clearance. Patient has a CTA scheduled for this Monday 01/02/2018 and depending on the result, he will then give [place] cardiac clearance. Mrs. [name] stated that we had told them that if we did not obtain clearance for surgery to be done on 01/02/2018, then it could be done on 01/06/2018. I told her that I would call the OR to schedule patient's surgery on 01/06/2018 just in case we get the clearance from the cardiologist. I told Mrs. [name] to please give [place] a call to let us know if we are a go on Friday 01/06/2018. She stated that she would.  I then called Leah from the OR and told her to switch the surgery date to 01/06/2018 hoping [place] getting cardiac clearance from patient's cardiologist on Monday 01/02/2018 after her CTA. Denny Peon stated that she would and that it would be scheduled to be done until 12:00 PM that day. I also told Denny Peon that I would let Angie or surgery scheduler know of the changes.

## 2017-12-29 NOTE — Telephone Encounter (Signed)
Spoke with Latoya Jones. Pt advised of pre op date/time and sx date. Sx: 01/02/18 with Dr Stephanie Acre placement.  Pre op: 12/29/17 between 9-1:00pm--phone interview.   Patient made aware to call [phone], between 1-3:00pm the day before surgery, to find out what time to arrive.

## 2017-12-29 NOTE — Patient Instructions (Signed)
Your procedure is scheduled on: 01-02-18 MONDAY Report to Same Day Surgery 2nd floor medical mall [facility] Entrance-take elevator on left to 2nd floor.  Check in with surgery information desk.) To find out your arrival time please call [phone] between 1PM - 3PM on 12-30-17 FRIDAY  Remember: Instructions that are not followed completely may result in serious medical risk, up to and including death, or upon the discretion of your surgeon and anesthesiologist your surgery may need to be rescheduled.    _x___ 1. Do not eat food after midnight the night before your procedure. NO GUM OR CANDY AFTER MIDNIGHT.  You may drink clear liquids up to 2 hours before you are scheduled to arrive at the hospital for your procedure.  Do not drink clear liquids within 2 hours of your scheduled arrival to the hospital.  Clear liquids include  --Water or Apple juice without pulp  --Clear carbohydrate beverage such as ClearFast or Gatorade  --Black Coffee or Clear Tea (No milk, no creamers, do not add anything to the coffee or Tea     __x__ 2. No Alcohol for 24 hours before or after surgery.   __x__3. No Smoking or e-cigarettes for 24 prior to surgery.  Do not use any chewable tobacco products for at least 6 hour prior to surgery   ____  4. Bring all medications with you on the day of surgery if instructed.    __x__ 5. Notify your doctor if there is any change in your medical condition     (cold, fever, infections).    x___6. On the morning of surgery brush your teeth with toothpaste and water.  You may rinse your mouth with mouth wash if you wish.  Do not swallow any toothpaste or mouthwash.   Do not wear jewelry, make-up, hairpins, clips or nail polish.  Do not wear lotions, powders, or perfumes. You may wear deodorant.  Do not shave 48 hours prior to surgery. Men may shave face and neck.  Do not bring valuables to the hospital.    [facility] is not responsible for any belongings or  valuables.               Contacts, dentures or bridgework may not be worn into surgery.  Leave your suitcase in the car. After surgery it may be brought to your room.  For patients admitted to the hospital, discharge time is determined by your treatment team.  _  Patients discharged the day of surgery will not be allowed to drive home.  You will need someone to drive you home and stay with you the night of your procedure.    Please read over the following fact sheets that you were given:   [facility] Preparing for Surgery and or MRSA Information   _x___ TAKE THE FOLLOWING MEDICATION THE MORNING OF SURGERY WITH A SMALL SIP OF WATER. These include:  1. METOPROLOL  2. PERPHENAZINE  3.  4.  5.  6.  ____Fleets enema or Magnesium Citrate as directed.   ____ Use CHG Soap or sage wipes as directed on instruction sheet   ____ Use inhalers on the day of surgery and bring to hospital day of surgery  ____ Stop Metformin and Janumet 2 days prior to surgery.    ____ Take 1/2 of usual insulin dose the night before surgery and none on the morning surgery.   ____ Follow recommendations from Cardiologist, Pulmonologist or PCP regarding stopping Aspirin, Coumadin, Plavix ,Eliquis, Effient, or Pradaxa,  and Pletal.  X____Stop Anti-inflammatories such as Advil, Aleve, Ibuprofen, Motrin, Naproxen, Naprosyn, Goodies powders or aspirin products NOW-OK to take Tylenol    ____ Stop supplements until after surgery.     ____ Bring C-Pap to the hospital.

## 2017-12-29 NOTE — Pre-Procedure Instructions (Signed)
Latoya Jones  ECHO COMPLETE WITH IMAGING ENHANCING AGENT  Order# [id number]  Reading physician: Nori Riis, PA-C Ordering physician: Gorden Harms, MD Study date: 12/04/17  Study Result   Result status: Final result                   *[place], [place] [address]                            [phone]  ------------------------------------------------------------------- Transthoracic Echocardiography  Patient:    Latoya, Jones MR #:       [id number] Study Date: 12/04/2017 Gender:     F Age:        10 Height:     165.1 cm Weight:     93.8 kg BSA:        2.11 m^2 Pt. Status: Room:       102A   ADMITTING    Harrie Foreman  PERFORMING   Neoma Laming, MD  SONOGRAPHER  Arville Go RDCS  ATTENDING    Salary, Montell D  ORDERING     Salary, Montell D  REFERRING    Salary, Montell D  cc:  ------------------------------------------------------------------- LV EF: 65%  ------------------------------------------------------------------- Indications:      Elevated Troponin.  ------------------------------------------------------------------- Study Conclusions  - Procedure narrative: Transthoracic echocardiography. Image   quality was suboptimal. The study was technically difficult, as a   result of poor acoustic windows and poor sound wave transmission.   Intravenous contrast (Definity) was administered. - Left ventricle: The cavity size was normal. Systolic function was   normal. The estimated ejection fraction was 65%. Wall motion was   normal; there were no regional wall motion abnormalities. Doppler   parameters are consistent with abnormal left ventricular   relaxation (grade 1 diastolic dysfunction). - Left atrium: The atrium was mildly dilated.  Impressions:  - normal left ventricular systolic function and normal wall motion   with mild  diastolic dysfunction.  ------------------------------------------------------------------- Study data:   Study status:  Routine.  Procedure:  Transthoracic echocardiography. Image quality was suboptimal. The study was technically difficult, as a result of poor acoustic windows and poor sound wave transmission. Intravenous contrast (Definity) was administered.          Transthoracic echocardiography.  M-mode, complete 2D, spectral Doppler, and color Doppler.  Birthdate: Patient birthdate: 08/30/1946.  Age:  Patient is 72 yr old.  Sex: Gender: female.    BMI: 34.4 kg/m^2.  Blood pressure:     120/58 Patient status:  Inpatient.  Study date:  Study date: 12/04/2017. Study time: 01:48 PM.  -------------------------------------------------------------------  ------------------------------------------------------------------- Left ventricle:  The cavity size was normal. Systolic function was normal. The estimated ejection fraction was 65%. Wall motion was normal; there were no regional wall motion abnormalities. Doppler parameters are consistent with abnormal left ventricular relaxation (grade 1 diastolic dysfunction).  ------------------------------------------------------------------- Aortic valve:   Doppler:  There was no significant regurgitation.   ------------------------------------------------------------------- Mitral valve:   Doppler:  There was trivial regurgitation.  ------------------------------------------------------------------- Left atrium:  The atrium was mildly dilated.  ------------------------------------------------------------------- Pulmonary veins: Common pulmonary vein:  The Doppler velocity and flow profile were normal.  -------------------------------------------------------------------  Right ventricle:  The cavity size was normal. Wall thickness was normal. Systolic function was  normal.  ------------------------------------------------------------------- Pulmonic valve:    Doppler:  There was trivial regurgitation.  ------------------------------------------------------------------- Tricuspid valve:   Doppler:  There was mild regurgitation.  ------------------------------------------------------------------- Measurements   Left ventricle                              Value        Reference  LV ID, ED, PLAX chordal                     47.9  mm     43 - 52  LV ID, ES, PLAX chordal                     30    mm     23 - 38  LV fx shortening, PLAX chordal              37    %      >=29  LV PW thickness, ED                         12.4  mm     ---------  IVS/LV PW ratio, ED                         0.95         <=1.3    Ventricular septum                          Value        Reference  IVS thickness, ED                           11.8  mm     ---------    LVOT                                        Value        Reference  LVOT ID, S                                  18    mm     ---------  LVOT area                                   2.54  cm^2   ---------    Aorta                                       Value        Reference  Aortic root ID, ED                          28    mm     ---------    Left atrium  Value        Reference  LA ID, A-P, ES                              32    mm     ---------  LA ID/bsa, A-P                              1.52  cm/m^2 <=2.2    Mitral valve                                Value        Reference  Mitral E-wave peak velocity                 64.5  cm/s   ---------  Mitral A-wave peak velocity                 93.6  cm/s   ---------  Mitral deceleration time                    169   ms     150 - 230  Mitral E/A ratio, peak                      0.7          ---------    Tricuspid valve                             Value        Reference  Tricuspid maximal inflow velocity,          266   cm/s    ---------  PISA    Pulmonic valve                              Value        Reference  Pulmonic valve peak velocity, S             130   cm/s   ---------  Legend: (L)  and  (H)  mark values outside specified reference range.  ------------------------------------------------------------------- Prepared and Electronically Authenticated by  Neoma Laming, MD 2019-01-13T18:38:13  [facility] Images   Show images for ECHOCARDIOGRAM COMPLETE  Patient Information   Patient Name Latoya, Jones Sex Female DOB 1946-02-29 SSN [id number]  Reason For Exam  Priority: Routine  Not on file  Surgical History   Surgical History   No past medical history on file.    Other Surgical History   Procedure Laterality Date Comment Source  COLOSTOMY REVISION N/A 12/06/2017 Procedure: COLON RESECTION RIGHT; Surgeon: Florene Glen, MD; Location: [facility] ORS; Service: General; Laterality: N/A; Provider  DILATION AND CURETTAGE, DIAGNOSTIC / THERAPEUTIC    Provider    Patient Data   Height 65 in    BP 120/58 mmHg       Performing Technologist/Nurse   Performing Technologist/Nurse: Arville Go S                    Implants     No active implants to display in this view.  Order-Level Documents:   There are no order-level documents.  Encounter-Level Documents - 12/03/2017:   Scan on 12/14/2017  12:26 PM by Default, Provider, MD  Scan on 12/14/2017 12:20 PM by Default, Provider, MD  Scan on 12/14/2017 11:20 AM by Default, Provider, MD  Scan on 12/14/2017 11:13 AM by Default, Provider, MD  Scan on 12/14/2017 11:06 AM by Default, Provider, MD  Document on 12/13/2017 4:44 PM by Rowe Robert, RN: IP After Visit Summary  Document on 12/07/2017 8:15 AM by Florene Glen, NT: ED PB Summary  Scan on 12/05/2017 9:01 AM by Default, Provider, MD  Electronic signature on 12/03/2017 4:54 AM  Electronic signature on 12/03/2017 4:53 AM      Signed   Electronically signed by Nori Riis, PA-C on 12/04/17 at 1838 EST  Printable Result Report   Result Report   External Result Report   External Result Report

## 2017-12-30 ENCOUNTER — Other Ambulatory Visit: Payer: Self-pay | Admitting: Oncology

## 2017-12-30 NOTE — Patient Instructions (Signed)

## 2018-01-03 ENCOUNTER — Inpatient Hospital Stay: Payer: Medicare Other | Attending: Oncology

## 2018-01-03 ENCOUNTER — Telehealth: Payer: Self-pay

## 2018-01-03 DIAGNOSIS — C180 Malignant neoplasm of cecum: Secondary | ICD-10-CM | POA: Insufficient documentation

## 2018-01-03 DIAGNOSIS — R970 Elevated carcinoembryonic antigen [CEA]: Secondary | ICD-10-CM | POA: Insufficient documentation

## 2018-01-03 DIAGNOSIS — R911 Solitary pulmonary nodule: Secondary | ICD-10-CM | POA: Insufficient documentation

## 2018-01-03 DIAGNOSIS — K5900 Constipation, unspecified: Secondary | ICD-10-CM | POA: Insufficient documentation

## 2018-01-03 DIAGNOSIS — Z87891 Personal history of nicotine dependence: Secondary | ICD-10-CM | POA: Insufficient documentation

## 2018-01-03 DIAGNOSIS — C181 Malignant neoplasm of appendix: Secondary | ICD-10-CM | POA: Insufficient documentation

## 2018-01-03 DIAGNOSIS — I700 Atherosclerosis of aorta: Secondary | ICD-10-CM | POA: Insufficient documentation

## 2018-01-03 DIAGNOSIS — Z79899 Other long term (current) drug therapy: Secondary | ICD-10-CM | POA: Insufficient documentation

## 2018-01-03 DIAGNOSIS — Z5111 Encounter for antineoplastic chemotherapy: Secondary | ICD-10-CM | POA: Insufficient documentation

## 2018-01-03 DIAGNOSIS — C786 Secondary malignant neoplasm of retroperitoneum and peritoneum: Secondary | ICD-10-CM | POA: Insufficient documentation

## 2018-01-03 DIAGNOSIS — N200 Calculus of kidney: Secondary | ICD-10-CM | POA: Insufficient documentation

## 2018-01-03 DIAGNOSIS — F209 Schizophrenia, unspecified: Secondary | ICD-10-CM | POA: Insufficient documentation

## 2018-01-03 DIAGNOSIS — N281 Cyst of kidney, acquired: Secondary | ICD-10-CM | POA: Insufficient documentation

## 2018-01-03 DIAGNOSIS — K8020 Calculus of gallbladder without cholecystitis without obstruction: Secondary | ICD-10-CM | POA: Insufficient documentation

## 2018-01-03 NOTE — Telephone Encounter (Signed)
Cardiac clearance obtained and approved at this time. Clearance can be found under Media Tab in patient's chart.

## 2018-01-05 ENCOUNTER — Telehealth: Payer: Self-pay | Admitting: Surgery

## 2018-01-05 MED ORDER — CEFAZOLIN SODIUM-DEXTROSE 2-4 GM/100ML-% IV SOLN
2.0000 g | INTRAVENOUS | Status: AC
  Administered 2018-01-06: 2 g via INTRAVENOUS

## 2018-01-05 NOTE — Telephone Encounter (Signed)
I have spoke with [name]-Caregiver. Patient has been cleared with Cardiology to have surgery.  Pt advised of pre op date/time and sx date. Sx: 01/06/18 with Dr Diego Cory Placement. Pre op: 12/29/17 completed.   [name] was made aware to call [phone], between 1-3:00pm the day before surgery, to find out what time to arrive.

## 2018-01-06 ENCOUNTER — Other Ambulatory Visit: Payer: Self-pay

## 2018-01-06 ENCOUNTER — Ambulatory Visit: Payer: Medicare Other

## 2018-01-06 ENCOUNTER — Ambulatory Visit: Payer: Medicare Other | Admitting: Certified Registered"

## 2018-01-06 ENCOUNTER — Other Ambulatory Visit: Payer: Self-pay | Admitting: *Deleted

## 2018-01-06 ENCOUNTER — Ambulatory Visit
Admission: RE | Admit: 2018-01-06 | Discharge: 2018-01-06 | Disposition: A | Discharge status: Home / Self Care | Payer: Medicare Other | Source: Ambulatory Visit | Attending: Surgery | Admitting: Surgery

## 2018-01-06 ENCOUNTER — Encounter
Admission: RE | Disposition: A | Discharge status: Home / Self Care | Payer: Self-pay | Source: Ambulatory Visit | Attending: Surgery

## 2018-01-06 DIAGNOSIS — I10 Essential (primary) hypertension: Secondary | ICD-10-CM | POA: Diagnosis not present

## 2018-01-06 DIAGNOSIS — C182 Malignant neoplasm of ascending colon: Secondary | ICD-10-CM

## 2018-01-06 DIAGNOSIS — Z87891 Personal history of nicotine dependence: Secondary | ICD-10-CM | POA: Insufficient documentation

## 2018-01-06 DIAGNOSIS — C189 Malignant neoplasm of colon, unspecified: Secondary | ICD-10-CM | POA: Diagnosis present

## 2018-01-06 DIAGNOSIS — Z79899 Other long term (current) drug therapy: Secondary | ICD-10-CM | POA: Diagnosis not present

## 2018-01-06 DIAGNOSIS — Z789 Other specified health status: Secondary | ICD-10-CM

## 2018-01-06 HISTORY — PX: PORTACATH PLACEMENT: SHX2246

## 2018-01-06 SURGERY — INSERTION, TUNNELED CENTRAL VENOUS DEVICE, WITH PORT
Anesthesia: General | Wound class: Clean

## 2018-01-06 MED ORDER — PROPOFOL 10 MG/ML IV BOLUS
INTRAVENOUS | Status: DC | PRN
  Administered 2018-01-06: 150 mg via INTRAVENOUS

## 2018-01-06 MED ORDER — FAMOTIDINE 20 MG PO TABS
ORAL_TABLET | ORAL | Status: AC
  Filled 2018-01-06: qty 1

## 2018-01-06 MED ORDER — PROCHLORPERAZINE MALEATE 10 MG PO TABS: 10.0000 mg | ORAL_TABLET | Freq: Four times a day (QID) | ORAL | 0 refills | Status: AC | PRN

## 2018-01-06 MED ORDER — DEXAMETHASONE SODIUM PHOSPHATE 10 MG/ML IJ SOLN
INTRAMUSCULAR | Status: AC
  Filled 2018-01-06: qty 1

## 2018-01-06 MED ORDER — MIDAZOLAM HCL 2 MG/2ML IJ SOLN
INTRAMUSCULAR | Status: AC
  Filled 2018-01-06: qty 2

## 2018-01-06 MED ORDER — PHENYLEPHRINE HCL 10 MG/ML IJ SOLN
INTRAMUSCULAR | Status: DC | PRN
  Administered 2018-01-06: 200 ug via INTRAVENOUS
  Administered 2018-01-06: 100 ug via INTRAVENOUS

## 2018-01-06 MED ORDER — HEPARIN SODIUM (PORCINE) 5000 UNIT/ML IJ SOLN
5000.0000 [IU] | Freq: Once | INTRAMUSCULAR | Status: AC
  Administered 2018-01-06: 5000 [IU] via SUBCUTANEOUS

## 2018-01-06 MED ORDER — LIDOCAINE HCL (PF) 2 % IJ SOLN
INTRAMUSCULAR | Status: AC
  Filled 2018-01-06: qty 10

## 2018-01-06 MED ORDER — DEXAMETHASONE 4 MG PO TABS: 4.0000 mg | ORAL_TABLET | Freq: Two times a day (BID) | ORAL | 0 refills | Status: DC

## 2018-01-06 MED ORDER — FENTANYL CITRATE (PF) 100 MCG/2ML IJ SOLN
INTRAMUSCULAR | Status: DC | PRN
  Administered 2018-01-06: 25 ug via INTRAVENOUS
  Administered 2018-01-06: 50 ug via INTRAVENOUS
  Administered 2018-01-06: 25 ug via INTRAVENOUS

## 2018-01-06 MED ORDER — DEXAMETHASONE SODIUM PHOSPHATE 10 MG/ML IJ SOLN
INTRAMUSCULAR | Status: DC | PRN
  Administered 2018-01-06: 10 mg via INTRAVENOUS

## 2018-01-06 MED ORDER — BUPIVACAINE HCL (PF) 0.5 % IJ SOLN
INTRAMUSCULAR | Status: AC
  Filled 2018-01-06: qty 30

## 2018-01-06 MED ORDER — ONDANSETRON HCL 4 MG/2ML IJ SOLN
INTRAMUSCULAR | Status: AC
  Filled 2018-01-06: qty 2

## 2018-01-06 MED ORDER — ONDANSETRON HCL 8 MG PO TABS: 8.0000 mg | ORAL_TABLET | Freq: Two times a day (BID) | ORAL | 0 refills | Status: DC | PRN

## 2018-01-06 MED ORDER — EPHEDRINE SULFATE 50 MG/ML IJ SOLN
INTRAMUSCULAR | Status: AC
  Filled 2018-01-06: qty 1

## 2018-01-06 MED ORDER — GLYCOPYRROLATE 0.2 MG/ML IJ SOLN
INTRAMUSCULAR | Status: DC | PRN
  Administered 2018-01-06: 0.2 mg via INTRAVENOUS

## 2018-01-06 MED ORDER — CEFAZOLIN SODIUM-DEXTROSE 2-4 GM/100ML-% IV SOLN
INTRAVENOUS | Status: AC
  Filled 2018-01-06: qty 100

## 2018-01-06 MED ORDER — FAMOTIDINE 20 MG PO TABS
20.0000 mg | ORAL_TABLET | Freq: Once | ORAL | Status: AC
  Administered 2018-01-06: 20 mg via ORAL

## 2018-01-06 MED ORDER — MIDAZOLAM HCL 2 MG/2ML IJ SOLN
INTRAMUSCULAR | Status: DC | PRN
  Administered 2018-01-06: 2 mg via INTRAVENOUS

## 2018-01-06 MED ORDER — HEPARIN SODIUM (PORCINE) 5000 UNIT/ML IJ SOLN
INTRAMUSCULAR | Status: AC
  Administered 2018-01-06: 5000 [IU] via SUBCUTANEOUS
  Filled 2018-01-06: qty 1

## 2018-01-06 MED ORDER — CHLORHEXIDINE GLUCONATE CLOTH 2 % EX PADS: 6.0000 | MEDICATED_PAD | Freq: Once | CUTANEOUS | Status: DC

## 2018-01-06 MED ORDER — HEPARIN SOD (PORK) LOCK FLUSH 100 UNIT/ML IV SOLN
INTRAVENOUS | Status: DC | PRN
  Administered 2018-01-06: 300 [IU] via INTRAVENOUS

## 2018-01-06 MED ORDER — FENTANYL CITRATE (PF) 100 MCG/2ML IJ SOLN
25.0000 ug | INTRAMUSCULAR | Status: DC | PRN
Start: 2018-01-06 — End: 2018-01-06

## 2018-01-06 MED ORDER — ONDANSETRON HCL 4 MG/2ML IJ SOLN
INTRAMUSCULAR | Status: DC | PRN
  Administered 2018-01-06: 4 mg via INTRAVENOUS

## 2018-01-06 MED ORDER — ONDANSETRON HCL 4 MG/2ML IJ SOLN: 4.0000 mg | Freq: Once | INTRAMUSCULAR | Status: DC | PRN

## 2018-01-06 MED ORDER — LIDOCAINE HCL (CARDIAC) 20 MG/ML IV SOLN
INTRAVENOUS | Status: DC | PRN
  Administered 2018-01-06: 100 mg via INTRAVENOUS

## 2018-01-06 MED ORDER — LIDOCAINE HCL 1 % IJ SOLN
INTRAMUSCULAR | Status: DC | PRN
  Administered 2018-01-06: 10 mL

## 2018-01-06 MED ORDER — LIDOCAINE-PRILOCAINE 2.5-2.5 % EX CREA: 1.0000 "application " | TOPICAL_CREAM | CUTANEOUS | 0 refills | Status: DC | PRN

## 2018-01-06 MED ORDER — HEPARIN SOD (PORK) LOCK FLUSH 100 UNIT/ML IV SOLN
INTRAVENOUS | Status: AC
Start: 2018-01-06 — End: 2018-01-06
  Filled 2018-01-06: qty 5

## 2018-01-06 MED ORDER — LACTATED RINGERS IV SOLN
INTRAVENOUS | Status: DC
  Administered 2018-01-06 (×2): via INTRAVENOUS

## 2018-01-06 MED ORDER — PROPOFOL 10 MG/ML IV BOLUS
INTRAVENOUS | Status: AC
  Filled 2018-01-06: qty 20

## 2018-01-06 MED ORDER — FENTANYL CITRATE (PF) 100 MCG/2ML IJ SOLN
INTRAMUSCULAR | Status: AC
  Filled 2018-01-06: qty 2

## 2018-01-06 MED ORDER — LIDOCAINE HCL (PF) 1 % IJ SOLN
INTRAMUSCULAR | Status: AC
  Filled 2018-01-06: qty 30

## 2018-01-06 MED ORDER — GLYCOPYRROLATE 0.2 MG/ML IJ SOLN
INTRAMUSCULAR | Status: AC
  Filled 2018-01-06: qty 1

## 2018-01-06 MED ORDER — EPHEDRINE SULFATE 50 MG/ML IJ SOLN
INTRAMUSCULAR | Status: DC | PRN
  Administered 2018-01-06 (×2): 10 mg via INTRAVENOUS

## 2018-01-06 SURGICAL SUPPLY — 26 items
BAG DECANTER FOR FLEXI CONT (MISCELLANEOUS) ×3 IMPLANT
BLADE SURG SZ11 CARB STEEL (BLADE) ×3 IMPLANT
CHLORAPREP W/TINT 26ML (MISCELLANEOUS) ×6 IMPLANT
COVER LIGHT HANDLE STERIS (MISCELLANEOUS) ×6 IMPLANT
DECANTER SPIKE VIAL GLASS SM (MISCELLANEOUS) ×6 IMPLANT
DERMABOND ADVANCED (GAUZE/BANDAGES/DRESSINGS) ×2
DERMABOND ADVANCED .7 DNX12 (GAUZE/BANDAGES/DRESSINGS) ×1 IMPLANT
DRAPE C-ARM 42X70 (DRAPES) ×3 IMPLANT
DRAPE LAPAROTOMY 77X122 PED (DRAPES) ×3 IMPLANT
ELECT REM PT RETURN 9FT ADLT (ELECTROSURGICAL) ×3
ELECTRODE REM PT RTRN 9FT ADLT (ELECTROSURGICAL) ×1 IMPLANT
GLOVE BIO SURGEON STRL SZ7 (GLOVE) ×3 IMPLANT
GLOVE BIOGEL PI IND STRL 7.5 (GLOVE) ×1 IMPLANT
GLOVE BIOGEL PI INDICATOR 7.5 (GLOVE) ×2
GOWN STRL REUS W/TWL LRG LVL3 (GOWN DISPOSABLE) ×6 IMPLANT
IV NS 500ML (IV SOLUTION) ×2
IV NS 500ML BAXH (IV SOLUTION) ×1 IMPLANT
KIT PORT POWER 8FR ISP CVUE (Miscellaneous) ×3 IMPLANT
KIT TURNOVER KIT A (KITS) ×3 IMPLANT
NEEDLE HYPO 25X1 1.5 SAFETY (NEEDLE) ×3 IMPLANT
PACK BASIN MINOR ARMC (MISCELLANEOUS) ×3 IMPLANT
SUT MNCRL AB 4-0 PS2 18 (SUTURE) ×3 IMPLANT
SUT VIC AB 3-0 SH 27 (SUTURE) ×2
SUT VIC AB 3-0 SH 27X BRD (SUTURE) ×1 IMPLANT
SYR 10ML LL (SYRINGE) ×3 IMPLANT
SYR 20CC LL (SYRINGE) ×3 IMPLANT

## 2018-01-06 NOTE — Anesthesia Preprocedure Evaluation (Signed)
Anesthesia Evaluation  Patient identified by MRN, date of birth, ID band Patient awake    Reviewed: Allergy & Precautions, H&P , NPO status , Patient's Chart, lab work & pertinent test results, reviewed documented beta blocker date and time   Airway Mallampati: III  TM Distance: >3 FB Neck ROM: full    Dental  (+) Edentulous Upper, Edentulous Lower   Pulmonary neg pulmonary ROS, former smoker,    Pulmonary exam normal        Cardiovascular hypertension, Pt. on home beta blockers negative cardio ROS Normal cardiovascular exam+ dysrhythmias  Rhythm:regular Rate:Normal     Neuro/Psych negative neurological ROS  negative psych ROS   GI/Hepatic Neg liver ROS,   Endo/Other  negative endocrine ROS  Renal/GU negative Renal ROS  negative genitourinary   Musculoskeletal   Abdominal   Peds  Hematology negative hematology ROS (+)   Anesthesia Other Findings History reviewed. No pertinent past medical history. History reviewed. No pertinent surgical history. BMI    Body Mass Index:  36.63 kg/m     Reproductive/Obstetrics negative OB ROS                             Anesthesia Physical  Anesthesia Plan  ASA: II  Anesthesia Plan:    Post-op Pain Management:    Induction:   PONV Risk Score and Plan:   Airway Management Planned: LMA  Additional Equipment:   Intra-op Plan:   Post-operative Plan: Extubation in OR  Informed Consent: I have reviewed the patients History and Physical, chart, labs and discussed the procedure including the risks, benefits and alternatives for the proposed anesthesia with the patient or authorized representative who has indicated his/her understanding and acceptance.   Dental Advisory Given  Plan Discussed with: CRNA and Surgeon  Anesthesia Plan Comments:         Anesthesia Quick Evaluation

## 2018-01-06 NOTE — Discharge Instructions (Addendum)
In addition to included general post-operative instructions for Placement of Central Venous Catheter with Subcutaneous Port,  Diet: Resume home heart healthy diet.   Activity: No heavy lifting >20 pounds (children, pets, laundry, garbage) or strenuous activity until follow-up, but light activity and walking are encouraged. Do not drive or drink alcohol if taking narcotic pain medications.  Wound care: 2 days after surgery (Sunday, 2/17), may shower/get incision wet with soapy water and pat dry (do not rub incisions), but no baths or submerging incision underwater until follow-up.   Medications: Resume all home medications. For mild to moderate pain: acetaminophen (Tylenol) or ibuprofen/naproxen (if no kidney disease). Combining Tylenol with alcohol can substantially increase your risk of causing liver disease.  Call office [phone]) at any time if any questions, worsening pain, fevers/chills, bleeding, drainage from incision site, or other concerns.  AMBULATORY SURGERY  DISCHARGE INSTRUCTIONS   1) The drugs that you were given will stay in your system until tomorrow so for the next 24 hours you should not:  A) Drive an automobile B) Make any legal decisions C) Drink any alcoholic beverage   2) You may resume regular meals tomorrow.  Today it is better to start with liquids and gradually work up to solid foods.  You may eat anything you prefer, but it is better to start with liquids, then soup and crackers, and gradually work up to solid foods.   3) Please notify your doctor immediately if you have any unusual bleeding, trouble breathing, redness and pain at the surgery site, drainage, fever, or pain not relieved by medication.    4) Additional Instructions:        Please contact your physician with any problems or Same Day Surgery at [phone], Monday through Friday 6 am to 4 pm, or [place] at [facility] number at [phone].

## 2018-01-06 NOTE — Anesthesia Postprocedure Evaluation (Signed)
Anesthesia Post Note  Patient: Latoya Jones  Procedure(s) Performed: INSERTION PORT-A-CATH (N/A )  Patient location during evaluation: PACU Anesthesia Type: General Level of consciousness: awake and alert and oriented Pain management: pain level controlled Vital Signs Assessment: post-procedure vital signs reviewed and stable Respiratory status: spontaneous breathing Cardiovascular status: blood pressure returned to baseline Anesthetic complications: no     Last Vitals:  Vitals:   01/06/18 1422 01/06/18 1436  BP: (!) 118/54 (!) 112/55  Pulse: 68 64  Resp: 14   Temp: 36.4 C   SpO2: 96% 98%    Last Pain:  Vitals:   01/06/18 1422  TempSrc: Temporal                 Zanae Kuehnle

## 2018-01-06 NOTE — Transfer of Care (Signed)
Immediate Anesthesia Transfer of Care Note  Patient: Latoya Jones  Procedure(s) Performed: INSERTION PORT-A-CATH (N/A )  Patient Location: PACU  Anesthesia Type:General  Level of Consciousness: drowsy and patient cooperative  Airway & Oxygen Therapy: Patient Spontanous Breathing and Patient connected to face mask oxygen  Post-op Assessment: Report given to RN, Post -op Vital signs reviewed and stable and Patient moving all extremities X 4  Post vital signs: Reviewed and stable  Last Vitals:  Vitals:   01/06/18 1111 01/06/18 1328  BP: (!) 141/78 96/61  Pulse: 65 64  Resp: 16 (!) 8  Temp: (!) 35.8 C (!) 36 C  SpO2: 98% 100%    Last Pain:  Vitals:   01/06/18 1111  TempSrc: Temporal         Complications: No apparent anesthesia complications

## 2018-01-06 NOTE — Anesthesia Post-op Follow-up Note (Signed)
Anesthesia QCDR form completed.        

## 2018-01-06 NOTE — Interval H&P Note (Signed)
History and Physical Interval Note:  01/06/2018 11:55 AM  Latoya Jones  has presented today for surgery, with the diagnosis of Colon cancer  The various methods of treatment have been discussed with the patient and family. After consideration of risks, benefits and other options for treatment, the patient has consented to  Procedure(s): INSERTION PORT-A-CATH (N/A) as a surgical intervention .  The patient's history has been reviewed, patient examined, no change in status, stable for surgery.  I have reviewed the patient's chart and labs.  Questions were answered to the patient's satisfaction.     Vickie Epley

## 2018-01-06 NOTE — Progress Notes (Addendum)
During chemotherapy class, caregiver, Latoya Jones voiced concern about Latoya Jones being attached to her pump for 48 hours. She is worried that it will stress her and she will ask constantly when it will be removed. I have spoken with Dr. Janese Banks and she will address. I have left a voicemail with Latoya Jones to return call for update. Oncology Nurse Navigator Documentation  Navigator Location: [facility] (01/06/18 0900)   )Navigator Encounter Type: Telephone (01/06/18 0900)                                                    Time Spent with Patient: 15 (01/06/18 0900)

## 2018-01-06 NOTE — Op Note (Signed)
SURGICAL PROCEDURE REPORT  DATE OF PROCEDURE: 01/06/2018   ATTENDING SURGEON: Corene Cornea E. Rosana Hoes, MD   ANESTHESIA: GETA per patient request (LMA)  PRE-OPERATIVE DIAGNOSIS: Colon cancer with residual non-resectable tumor requiring durable central venous access for chemotherapy (ICD-10's: C18.9)  POST-OPERATIVE DIAGNOSIS: Colon cancer with residual non-resectable tumor requiring durable central venous access for chemotherapy (ICD-10's: C18.9)  PROCEDURE(S): (cpt: 36561) 1.) Percutaneous access of Right internal jugular vein under ultrasound guidance  2.) Insertion of tunneled Right internal jugular Bard PowerPort central venous catheter with subcutaneous port  INTRAOPERATIVE FINDINGS: Patent easily compressible Right internal jugular vein with appropriate respiratory variations and well-secured tunneled central venous catheter with subcutaneous port at completion of the procedure  INTRAOPERATIVE FLUIDS: 700 mL crystalloid, 0 mL contrast used   FLUOROSCOPY: <1 second  ESTIMATED BLOOD LOSS: Minimal (<20 mL)   SPECIMENS: None   IMPLANTS: 107F tunneled Bard PowerPort central venous catheter with subcutaneous port  DRAINS: None   COMPLICATIONS: None apparent   CONDITION AT COMPLETION: Hemodynamically stable, awake   DISPOSITION: PACU   INDICATION(S) FOR PROCEDURE:  Patient is a 72 y.o. female who presented with colon cancer and residual unresectable tumor requiring durable central venous access for chemotherapy. All risks, benefits, and alternatives to above elective procedures were discussed with the patient, who elected to proceed, and informed consent was accordingly obtained at that time.  DETAILS OF PROCEDURE:  Patient was brought to the operative suite and appropriately identified. In Trendelenburg position, Right IJ venous access site was prepped and draped in the usual sterile fashion, and following a brief timeout, limited duplex evaluation of the Right internal jugular vein  was performed. Percutaneous Right IJ venous access was obtained under ultrasound guidance using Seldinger technique, by which local anesthetic was injected over the Right IJ vein, and access needle was inserted under direct ultrasound visualization into the Right IJ vein, through which soft guidewire was advanced, over which access needle was withdrawn. Guidewire was secured, attention was directed to injection of local anesthetic along the planned tunnel site, 2-3 cm transverse Right chest incision was made and confirmed to accommodate the subcutaneous port, and flushed catheter was tunneled retrograde from the port site over the Right chest to the Right IJ access site with the attached port well-secured to the catheter and within the subcutaneous pocket. Insertion sheath was advanced over the guidewire, which was withdrawn along with the insertion sheath dilator. Length of catheter needed to position the catheter tip at the atrio-caval junction was then measured under direct fluoroscopic visualization, after which the catheter was cut to the measured length and advanced through the sheath into the Right internal jugular vein and SVC without evidence of cardiac arrhythmias during the procedure. Port was confirmed to withdraw blood and flush easily, after which concentrated heparin was instilled into the port and catheter. Dermis at the subcutaneous pocket was re-approximated using buried interrupted 3-0 Vicryl suture, and 4-0 Vicryl suture was used to re-approximate skin at the insertion and subcutaneous port sites in running subcuticular fashion for the subcutaneous port and buried interrupted fashion for the insertion site. Skin was cleaned, dried, and sterile skin glue was applied. Patient was then safely able to be extubated and transferred to PACU for a chest x-ray.  I was present for all aspects of the procedures, and there were no intraprocedural complications apparent.

## 2018-01-09 ENCOUNTER — Telehealth: Payer: Self-pay | Admitting: General Practice

## 2018-01-09 NOTE — Telephone Encounter (Signed)
I will send a message to [facility] via staff message asking her how I could help.

## 2018-01-09 NOTE — Telephone Encounter (Signed)
Judeen Hammans with [facility] with Dr. Deidre Ala is calling asking if one of the nurses could give her a call back at [phone].

## 2018-01-10 ENCOUNTER — Encounter: Payer: Self-pay | Admitting: Oncology

## 2018-01-10 ENCOUNTER — Inpatient Hospital Stay: Payer: Medicare Other

## 2018-01-10 ENCOUNTER — Inpatient Hospital Stay (HOSPITAL_BASED_OUTPATIENT_CLINIC_OR_DEPARTMENT_OTHER): Payer: Medicare Other | Admitting: Oncology

## 2018-01-10 ENCOUNTER — Telehealth: Payer: Self-pay | Admitting: *Deleted

## 2018-01-10 VITALS — BP 129/68 | HR 61 | Temp 98.2°F | Resp 16 | Wt 194.0 lb

## 2018-01-10 DIAGNOSIS — R911 Solitary pulmonary nodule: Secondary | ICD-10-CM

## 2018-01-10 DIAGNOSIS — C189 Malignant neoplasm of colon, unspecified: Secondary | ICD-10-CM

## 2018-01-10 DIAGNOSIS — F209 Schizophrenia, unspecified: Secondary | ICD-10-CM | POA: Diagnosis not present

## 2018-01-10 DIAGNOSIS — C786 Secondary malignant neoplasm of retroperitoneum and peritoneum: Secondary | ICD-10-CM | POA: Diagnosis not present

## 2018-01-10 DIAGNOSIS — K8020 Calculus of gallbladder without cholecystitis without obstruction: Secondary | ICD-10-CM | POA: Diagnosis not present

## 2018-01-10 DIAGNOSIS — Z5111 Encounter for antineoplastic chemotherapy: Secondary | ICD-10-CM

## 2018-01-10 DIAGNOSIS — Z79899 Other long term (current) drug therapy: Secondary | ICD-10-CM | POA: Diagnosis not present

## 2018-01-10 DIAGNOSIS — R970 Elevated carcinoembryonic antigen [CEA]: Secondary | ICD-10-CM | POA: Diagnosis not present

## 2018-01-10 DIAGNOSIS — C181 Malignant neoplasm of appendix: Secondary | ICD-10-CM

## 2018-01-10 DIAGNOSIS — C180 Malignant neoplasm of cecum: Secondary | ICD-10-CM | POA: Diagnosis not present

## 2018-01-10 DIAGNOSIS — N281 Cyst of kidney, acquired: Secondary | ICD-10-CM | POA: Diagnosis not present

## 2018-01-10 DIAGNOSIS — K5900 Constipation, unspecified: Secondary | ICD-10-CM | POA: Diagnosis not present

## 2018-01-10 DIAGNOSIS — N200 Calculus of kidney: Secondary | ICD-10-CM | POA: Diagnosis not present

## 2018-01-10 DIAGNOSIS — D649 Anemia, unspecified: Secondary | ICD-10-CM

## 2018-01-10 DIAGNOSIS — I700 Atherosclerosis of aorta: Secondary | ICD-10-CM | POA: Diagnosis not present

## 2018-01-10 DIAGNOSIS — Z87891 Personal history of nicotine dependence: Secondary | ICD-10-CM | POA: Diagnosis not present

## 2018-01-10 LAB — FERRITIN: Ferritin: 81 ng/mL (ref 11–307)

## 2018-01-10 LAB — CBC WITH DIFFERENTIAL/PLATELET
BASOS ABS: 0.1 10*3/uL (ref 0–0.1)
Basophils Relative: 1 %
EOS ABS: 0.1 10*3/uL (ref 0–0.7)
EOS PCT: 1 %
HCT: 33.4 % — ABNORMAL LOW (ref 35.0–47.0)
Hemoglobin: 10.9 g/dL — ABNORMAL LOW (ref 12.0–16.0)
LYMPHS ABS: 1 10*3/uL (ref 1.0–3.6)
Lymphocytes Relative: 17 %
MCH: 28.5 pg (ref 26.0–34.0)
MCHC: 32.7 g/dL (ref 32.0–36.0)
MCV: 87.1 fL (ref 80.0–100.0)
MONO ABS: 0.6 10*3/uL (ref 0.2–0.9)
Monocytes Relative: 10 %
Neutro Abs: 3.9 10*3/uL (ref 1.4–6.5)
Neutrophils Relative %: 71 %
PLATELETS: 323 10*3/uL (ref 150–440)
RBC: 3.84 MIL/uL (ref 3.80–5.20)
RDW: 15.4 % — AB (ref 11.5–14.5)
WBC: 5.6 10*3/uL (ref 3.6–11.0)

## 2018-01-10 LAB — COMPREHENSIVE METABOLIC PANEL
ALBUMIN: 3.2 g/dL — AB (ref 3.5–5.0)
ALT: 23 U/L (ref 14–54)
AST: 28 U/L (ref 15–41)
Alkaline Phosphatase: 57 U/L (ref 38–126)
Anion gap: 8 (ref 5–15)
BILIRUBIN TOTAL: 0.5 mg/dL (ref 0.3–1.2)
BUN: 9 mg/dL (ref 6–20)
CO2: 25 mmol/L (ref 22–32)
CREATININE: 0.74 mg/dL (ref 0.44–1.00)
Calcium: 8.9 mg/dL (ref 8.9–10.3)
Chloride: 104 mmol/L (ref 101–111)
GFR calc Af Amer: >60 mL/min (ref >60)
GLUCOSE: 112 mg/dL — AB (ref 65–99)
Potassium: 3.9 mmol/L (ref 3.5–5.1)
Sodium: 137 mmol/L (ref 135–145)
TOTAL PROTEIN: 6.5 g/dL (ref 6.5–8.1)

## 2018-01-10 LAB — IRON AND TIBC
IRON: 37 ug/dL (ref 28–170)
Saturation Ratios: 11 % (ref 10.4–31.8)
TIBC: 332 ug/dL (ref 250–450)
UIBC: 295 ug/dL

## 2018-01-10 MED ORDER — SODIUM CHLORIDE 0.9 % IV SOLN
2400.0000 mg/m2 | INTRAVENOUS | Status: DC
  Administered 2018-01-10: 4800 mg via INTRAVENOUS
  Filled 2018-01-10: qty 30
  Filled 2018-01-10: qty 96

## 2018-01-10 MED ORDER — DEXAMETHASONE SODIUM PHOSPHATE 10 MG/ML IJ SOLN
10.0000 mg | Freq: Once | INTRAMUSCULAR | Status: AC
  Administered 2018-01-10: 10 mg via INTRAVENOUS
  Filled 2018-01-10: qty 1

## 2018-01-10 MED ORDER — DEXTROSE 5 % IV SOLN
Freq: Once | INTRAVENOUS | Status: AC
  Administered 2018-01-10: 10:00:00 via INTRAVENOUS
  Filled 2018-01-10: qty 1000

## 2018-01-10 MED ORDER — DEXTROSE 5 % IV SOLN
85.0000 mg/m2 | Freq: Once | INTRAVENOUS | Status: AC
  Administered 2018-01-10: 170 mg via INTRAVENOUS
  Filled 2018-01-10 (×3): qty 34

## 2018-01-10 MED ORDER — LEUCOVORIN CALCIUM INJECTION 350 MG
800.0000 mg | Freq: Once | INTRAMUSCULAR | Status: AC
  Administered 2018-01-10: 800 mg via INTRAVENOUS
  Filled 2018-01-10 (×3): qty 40

## 2018-01-10 MED ORDER — PALONOSETRON HCL INJECTION 0.25 MG/5ML
0.2500 mg | Freq: Once | INTRAVENOUS | Status: AC
  Administered 2018-01-10: 0.25 mg via INTRAVENOUS
  Filled 2018-01-10: qty 5

## 2018-01-10 MED ORDER — FLUOROURACIL CHEMO INJECTION 2.5 GM/50ML
400.0000 mg/m2 | Freq: Once | INTRAVENOUS | Status: AC
Start: 2018-01-10 — End: 2018-01-10
  Administered 2018-01-10: 800 mg via INTRAVENOUS
  Filled 2018-01-10 (×2): qty 16

## 2018-01-10 MED ORDER — DEXAMETHASONE SODIUM PHOSPHATE 100 MG/10ML IJ SOLN: 10.0000 mg | Freq: Once | INTRAMUSCULAR | Status: DC

## 2018-01-10 NOTE — Telephone Encounter (Signed)
All messages are via in basket and the surgeon office was ok with pt starting chemo today.

## 2018-01-10 NOTE — Progress Notes (Signed)
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 She presented to the ER with symptoms of dizziness mostly when she stands up. She has had problems with constipation in the past and takes prune juice to relieve it. Patient is a poor historian at baseline and complained of abdominal pain in ER which led to CT abdomen. 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   CT chest without contrast showed tiny 2 mm right upper lobe pulmonary nodule.  No other evidence of metastatic disease.  Baseline CEA elevated at 4.8  Patient underwent exploratory laparotomy with right hemicolectomy and terminal ileum resection as well as separate small bowel resection on 12/06/2017.  Operative findings showed a large cecal tumor with extramural spread to retroperitoneum mesentery, mesentery lymph nodes and parietal peritoneum.  Dense inflammatory response of the retroperitoneum and small bowel.There was a second portion of small bowel that was involved and was resected.  A small portion of free tumor was identified in the retroperitoneum which was sent off for examination as well.  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  Radial margin positive for invasive carcinoma 2 colon adenomas 2.5 and 1.8 cm of the ascending colon.  Metastatic adenocarcinoma in 4 out  of 13 lymph nodes.  2 tumor deposits Segment of small intestine with abscess which also showed adenocarcinoma invading the distal appendix with perforation.  One mesenteric lymph node negative for malignancy.  Retroperitoneal tumor removal was also positive for mucinous adenocarcinoma.  Tumor was grade 2.  Proximal and distal margins were negative.pT4b pN2a. MSI stable  Case discussed at tumor board. PET CT will not be done at this time as it may represent post surgical changes. Radiation oncology does not recommend adjuvant RT upon completion of chemotherapy. Plan is to receive 12 cycles or 6 months of adjuvant FOLFOX chemotherapy  Interval history-she is doing well at her group home and reports no symptoms of abdominal pain nausea vomiting or diarrhea.  ECOG PS- 1 Pain scale- 0   Review of systems- Review of Systems  Constitutional: Negative for chills, fever, malaise/fatigue and weight loss.  HENT: Negative for congestion, ear discharge and nosebleeds.   Eyes: Negative for blurred vision.  Respiratory: Negative for cough, hemoptysis, sputum production, shortness of breath and wheezing.   Cardiovascular: Negative for chest pain, palpitations, orthopnea and claudication.  Gastrointestinal: Negative for abdominal pain, blood in stool, constipation, diarrhea, heartburn, melena, nausea and vomiting.  Genitourinary: Negative for dysuria, flank pain, frequency, hematuria and urgency.  Musculoskeletal: Negative for back pain, joint pain and myalgias.  Skin: Negative for rash.  Neurological: Negative for dizziness, tingling, focal weakness, seizures, weakness and headaches.  Endo/Heme/Allergies: Does not bruise/bleed easily.  Psychiatric/Behavioral: Negative for depression and suicidal ideas. The patient does not have insomnia.      No Known Allergies   Past Medical History:  Diagnosis Date  . Cancer ([place])  Past Surgical History:  Procedure Laterality Date  . COLOSTOMY REVISION N/A  12/06/2017   Procedure: COLON RESECTION RIGHT;  Surgeon: Florene Glen, MD;  Location: [facility] ORS;  Service: General;  Laterality: N/A;  . DILATION AND CURETTAGE, DIAGNOSTIC / THERAPEUTIC    . PORTACATH PLACEMENT N/A 01/06/2018   Procedure: INSERTION PORT-A-CATH;  Surgeon: Vickie Epley, MD;  Location: [facility] ORS;  Service: General;  Laterality: N/A;    Social History   Socioeconomic History  . Marital status: Widowed    Spouse name: Not on file  . Number of children: Not on file  . Years of education: Not on file  . Highest education level: Not on file  Social Needs  . Financial resource strain: Patient refused  . Food insecurity - worry: Patient refused  . Food insecurity - inability: Patient refused  . Transportation needs - medical: Patient refused  . Transportation needs - non-medical: Patient refused  Occupational History  . Not on file  Tobacco Use  . Smoking status: Former Research scientist (life sciences)  . Smokeless tobacco: Never Used  Substance and Sexual Activity  . Alcohol use: No  . Drug use: No  . Sexual activity: No  Other Topics Concern  . Not on file  Social History Narrative  . Not on file    Family History  Problem Relation Age of Onset  . ALS Mother   . ALS Father      Current Outpatient Medications:  .  acetaminophen (TYLENOL) 500 MG tablet, Take 1,000 mg by mouth every 8 (eight) hours as needed for mild pain., Disp: , Rfl:  .  alendronate (FOSAMAX) 70 MG tablet, Take 70 mg by mouth once a week., Disp: , Rfl:  .  ARIPiprazole (ABILIFY) 5 MG tablet, Take 5 mg by mouth at bedtime. , Disp: , Rfl:  .  benztropine (COGENTIN) 0.5 MG tablet, Take 0.5 mg by mouth at bedtime., Disp: , Rfl:  .  Calcium 600-200 MG-UNIT tablet, Take 1 tablet by mouth daily., Disp: , Rfl:  .  calcium carbonate (TUMS - DOSED IN MG ELEMENTAL CALCIUM) 500 MG chewable tablet, Chew 1 tablet by mouth 2 (two) times daily as needed for indigestion or heartburn., Disp: , Rfl:  .  dexamethasone (DECADRON) 4  MG tablet, Take 1 tablet (4 mg total) by mouth 2 (two) times daily with a meal. Starting the day after chemotherapy for 2 days, Disp: 30 tablet, Rfl: 0 .  divalproex (DEPAKOTE) 500 MG DR tablet, Take 500 mg by mouth at bedtime. , Disp: , Rfl:  .  lidocaine-prilocaine (EMLA) cream, Apply 1 application topically as needed. 1 hour prior to coming for each chemotherapy treatment. Place small amount over port site and place saran wrap over the cream to protect the clothing, Disp: 30 g, Rfl: 0 .  memantine (NAMENDA XR) 14 MG CP24 24 hr capsule, Take 14 mg by mouth at bedtime. , Disp: , Rfl:  .  metoprolol succinate (TOPROL-XL) 50 MG 24 hr tablet, Take 50 mg by mouth every morning. Take with or immediately following a meal. , Disp: , Rfl:  .  Multiple Vitamin (MULTIVITAMIN WITH MINERALS) TABS tablet, Take 1 tablet by mouth daily. (Patient taking differently: Take 1 tablet by mouth daily. ), Disp: 30 tablet, Rfl: 0 .  ondansetron (ZOFRAN) 8 MG tablet, Take 1 tablet (8 mg total) by mouth 2 (two) times daily as needed for nausea or vomiting. Can start using on day 3 after chemo, Disp: 20 tablet, Rfl: 0 .  perphenazine (TRILAFON) 4 MG tablet, Take 4 mg by mouth 2 (two) times daily., Disp: , Rfl:  .  prochlorperazine (COMPAZINE) 10 MG tablet, Take 1 tablet (10 mg total) by mouth every 6 (six) hours as needed for nausea or vomiting., Disp: 30 tablet, Rfl: 0  Physical exam: There were no vitals filed for this visit. Physical Exam  Constitutional: She is oriented to person, place, and time and well-developed, well-nourished, and in no distress.  HENT:  Head: Normocephalic and atraumatic.  Eyes: EOM are normal. Pupils are equal, round, and reactive to light.  Neck: Normal range of motion.  Cardiovascular: Normal rate, regular rhythm and normal heart sounds.  Pulmonary/Chest: Effort normal and breath sounds normal.  Abdominal: Soft. Bowel sounds are normal.  Horizontal surgical scar seen in the right middle  abdomen well healed  Neurological: She is alert and oriented to person, place, and time.  Skin: Skin is warm and dry.     CMP Latest Ref Rng & Units 01/10/2018  Glucose 65 - 99 mg/dL 112(H)  BUN 6 - 20 mg/dL 9  Creatinine 0.44 - 1.00 mg/dL 0.74  Sodium 135 - 145 mmol/L 137  Potassium 3.5 - 5.1 mmol/L 3.9  Chloride 101 - 111 mmol/L 104  CO2 22 - 32 mmol/L 25  Calcium 8.9 - 10.3 mg/dL 8.9  Total Protein 6.5 - 8.1 g/dL 6.5  Total Bilirubin 0.3 - 1.2 mg/dL 0.5  Alkaline Phos 38 - 126 U/L 57  AST 15 - 41 U/L 28  ALT 14 - 54 U/L 23   CBC Latest Ref Rng & Units 01/10/2018  WBC 3.6 - 11.0 K/uL 5.6  Hemoglobin 12.0 - 16.0 g/dL 10.9(L)  Hematocrit 35.0 - 47.0 % 33.4(L)  Platelets 150 - 440 K/uL 323    No images are attached to the encounter.  Dg Chest Port 1 View  Result Date: 01/06/2018 CLINICAL DATA:  Central venous catheter. EXAM: PORTABLE CHEST 1 VIEW COMPARISON:  CT 12/10/2016. FINDINGS: PowerPort catheter noted with tip over the superior vena cava. Stable cardiomegaly. No pulmonary venous congestion. Mild basilar atelectasis. No pleural effusion or pneumothorax. IMPRESSION: 1.  PowerPort catheter noted with tip over the superior vena cava. 2.  Mild bibasilar atelectasis and/or scarring. 3.  Cardiomegaly with normal pulmonary vascularity. Electronically Signed   By: Marcello Moores  Register   On: 01/06/2018 13:58   Dg C-arm 1-60 Min-no Report  Result Date: 01/06/2018 Fluoroscopy was utilized by the requesting physician.  No radiographic interpretation.     Assessment and plan- Patient is a 72 y.o. female of the colon at least stage IIIC p T4b pN2aMx status post hemicolectomy here for on treatment assessment prior to cycle 1 of FOLFOX chemotherapy  Counts are ok to proceed with cycle 1 of FOLFOX chemotherapy.  Again discussed risks and benefits of chemotherapy including all but not limited to nausea, vomiting, low blood counts, risk of infections.  Risk of peripheral neuropathy associated  with oxaliplatin.  Patient understands and agrees to proceed patient has baseline schizophrenia but can make her own decisions. She has been instructed not to tamper with the chemo pump.  She will come back in 2 days to get her pump disconnected  I will see her back in 2 weeks time with CBC CMP and CEA for cycle #2 of adjuvant FOLFOX chemotherapy  She does have baseline anemia with a hemoglobin around 10.  Iron studies are normal.  Continue to monitor    Visit Diagnosis 1. Colon adenocarcinoma ([place])  2. Encounter for antineoplastic chemotherapy   3. Normocytic anemia      Dr. Randa Evens, MD, MPH [facility] at [facility] Pager- [phone] 01/10/2018 12:24 PM

## 2018-01-10 NOTE — Telephone Encounter (Signed)
-----   Message from Wayna Chalet, [place] sent at 01/10/2018 10:22 AM EST ----- Regarding: RE: How can I help? Sorry to get to you this late, I was in a room with a patient and Psychologist, sport and exercise.  Yes, she is good to go.  ----- Message ----- From: Luella Cook, RN Sent: 01/10/2018   8:58 AM To: Wayna Chalet, CMA Subject: RE: How can I help?                            Patient is suppose to start chemo today.  Want to get ok by surgeon that she can start her chemo today.  She is healed up and ok to get chemo.  Sheis due to come 9:45 today ----- Message ----- From: Wayna Chalet, CMA Sent: 01/09/2018   5:31 PM To: Luella Cook, RN Subject: How can I help?                                Hi Sherry. I was able to look at the messages until now. Please tell me how I could help?

## 2018-01-10 NOTE — Telephone Encounter (Signed)
Judeen Hammans from the [facility] wanted to know if patient was able to start her chemo today. I replied back by telling her that she was good to go to start her chemotherapy.

## 2018-01-11 LAB — CEA: CEA: 3.1 ng/mL (ref 0.0–4.7)

## 2018-01-12 ENCOUNTER — Inpatient Hospital Stay: Payer: Medicare Other

## 2018-01-12 ENCOUNTER — Ambulatory Visit: Payer: Self-pay

## 2018-01-12 ENCOUNTER — Ambulatory Visit: Payer: Self-pay | Admitting: Oncology

## 2018-01-12 ENCOUNTER — Other Ambulatory Visit: Payer: Self-pay

## 2018-01-12 VITALS — BP 145/68 | HR 65 | Temp 97.3°F | Resp 20

## 2018-01-12 DIAGNOSIS — C189 Malignant neoplasm of colon, unspecified: Secondary | ICD-10-CM

## 2018-01-12 DIAGNOSIS — Z5111 Encounter for antineoplastic chemotherapy: Secondary | ICD-10-CM | POA: Diagnosis not present

## 2018-01-12 MED ORDER — HEPARIN SOD (PORK) LOCK FLUSH 100 UNIT/ML IV SOLN
500.0000 [IU] | Freq: Once | INTRAVENOUS | Status: AC | PRN
  Administered 2018-01-12: 500 [IU]

## 2018-01-12 MED ORDER — HEPARIN SOD (PORK) LOCK FLUSH 100 UNIT/ML IV SOLN
INTRAVENOUS | Status: AC
  Filled 2018-01-12: qty 5

## 2018-01-12 MED ORDER — SODIUM CHLORIDE 0.9% FLUSH
10.0000 mL | INTRAVENOUS | Status: DC | PRN
  Administered 2018-01-12: 10 mL
  Filled 2018-01-12: qty 10

## 2018-01-13 DIAGNOSIS — C182 Malignant neoplasm of ascending colon: Secondary | ICD-10-CM

## 2018-01-16 ENCOUNTER — Ambulatory Visit (INDEPENDENT_AMBULATORY_CARE_PROVIDER_SITE_OTHER): Payer: Medicare Other | Admitting: Surgery

## 2018-01-16 ENCOUNTER — Encounter: Payer: Self-pay | Admitting: Surgery

## 2018-01-16 VITALS — BP 120/84 | HR 98 | Temp 98.0°F | Ht 65.0 in | Wt 187.6 lb

## 2018-01-16 DIAGNOSIS — C189 Malignant neoplasm of colon, unspecified: Secondary | ICD-10-CM

## 2018-01-16 NOTE — Progress Notes (Signed)
Surgical Clinic Progress/Follow-up Note   HPI:  72 y.o. Female presents to clinic for post-op follow-up evaluation s/p insertion of Right IJ central venous catheter with subcutaneous port. Patient reports she's been feeling well with the port accessed once already with no difficulty, denies any peri-incisional pain or any fever/chills, N/V, CP, or SOB.  Review of Systems:  Constitutional: denies any other weight loss, fever, chills, or sweats  Eyes: denies any other vision changes, history of eye injury  ENT: denies sore throat, hearing problems  Respiratory: denies shortness of breath, wheezing  Cardiovascular: denies chest pain, palpitations  Gastrointestinal: denies abdominal pain, N/V, or diarrhea Musculoskeletal: denies any other joint pains or cramps  Skin: Denies any other rashes or skin discolorations except as per HPI Neurological: denies any other headache, dizziness, weakness  Psychiatric: denies any other depression, anxiety  All other review of systems: otherwise negative   Vital Signs:  BP 120/84   Pulse 98   Temp 98 F (36.7 C) (Oral)   Ht 5\' 5"  (1.651 m)   Wt 187 lb 9.6 oz (85.1 kg)   BMI 31.22 kg/m    Physical Exam:  Constitutional:  -- Normal body habitus  -- Awake, alert, and oriented x3  Eyes:  -- Pupils equally round and reactive to light  -- No scleral icterus  Ear, nose, throat:  -- No jugular venous distension  -- No nasal drainage, bleeding Pulmonary:  -- No crackles -- Equal breath sounds bilaterally -- Breathing non-labored at rest Cardiovascular:  -- S1, S2 present  -- No pericardial rubs  Gastrointestinal:  -- Soft, nontender, non-distended, no guarding/rebound  -- No abdominal masses appreciated, pulsatile or otherwise  Musculoskeletal / Integumentary:  -- Wounds or skin discoloration: Right neck and chest wall incisions well-approximated and completely NT without any erythema or drainage  -- Extremities: B/L UE and LE FROM, hands  and feet warm, no edema  Neurologic:  -- Motor function: intact and symmetric  -- Sensation: intact and symmetric   Assessment:  72 y.o. yo Female with a problem list including...  Patient Active Problem List   Diagnosis Date Noted  . Malignant neoplasm of colon ([place])   . Colon adenocarcinoma ([place]) 12/22/2017  . Lactic acidosis   . Sepsis ([place]) 12/03/2017  . Colonic mass     presents to clinic for post-op follow-up evaluation, doing well s/p placement of Right IJ central venous catheter with subcutaneous port for chemotherapy.  Plan:   - okay to use port for chemotherapy   - may shower and resume activities without restrictions  - advised to call office to schedule follow-up and removal when port no longer indicated  - instructed to call office if any questions or concerns  All of the above recommendations were discussed with the patient and patient's caregiver, and all of patient's and caregiver's questions were answered to their expressed satisfaction.  -- Marilynne Drivers Rosana Hoes, MD, [place]: [facility] - Partnering for exceptional care. Office: [phone]

## 2018-01-16 NOTE — Patient Instructions (Signed)
Informed patient that they should increase water intake and activity level as much as possible to help with bowel movement. Also educated that they may use Miralax over the counter x 2 doses, 6 hours apart, followed by Dulcolax x 2 doses, 6 hours apart until they have a successful bowel movement. If they are unsuccessful with oral medications, they will need to do 2 fleets enemas back to back. Asked patient to call back for further instructions if after taking all doses of these medications, they are still unsuccessful with a bowel movement.  Patient verbalizes understanding of this information.    Constipation, Adult Constipation is when a person:  Poops (has a bowel movement) fewer times in a week than normal.  Has a hard time pooping.  Has poop that is dry, hard, or bigger than normal.  Follow these instructions at home: Eating and drinking   Eat foods that have a lot of fiber, such as: ? Fresh fruits and vegetables. ? Whole grains. ? Beans.  Eat less of foods that are high in fat, low in fiber, or overly processed, such as: ? [place] fries. ? Hamburgers. ? Cookies. ? Candy. ? Soda.  Drink enough fluid to keep your pee (urine) clear or pale yellow. General instructions  Exercise regularly or as told by your doctor.  Go to the restroom when you feel like you need to poop. Do not hold it in.  Take over-the-counter and prescription medicines only as told by your doctor. These include any fiber supplements.  Do pelvic floor retraining exercises, such as: ? Doing deep breathing while relaxing your lower belly (abdomen). ? Relaxing your pelvic floor while pooping.  Watch your condition for any changes.  Keep all follow-up visits as told by your doctor. This is important. Contact a doctor if:  You have pain that gets worse.  You have a fever.  You have not pooped for 4 days.  You throw up (vomit).  You are not hungry.  You lose weight.  You are bleeding from  the anus.  You have thin, pencil-like poop (stool). Get help right away if:  You have a fever, and your symptoms suddenly get worse.  You leak poop or have blood in your poop.  Your belly feels hard or bigger than normal (is bloated).  You have very bad belly pain.  You feel dizzy or you faint. This information is not intended to replace advice given to you by your health care provider. Make sure you discuss any questions you have with your health care provider. Document Released: 04/26/2008 Document Revised: 05/28/2016 Document Reviewed: 04/28/2016 Elsevier Interactive Patient Education  2018 [employer].

## 2018-01-17 LAB — SURGICAL PATHOLOGY

## 2018-01-24 ENCOUNTER — Inpatient Hospital Stay: Payer: Medicare Other | Attending: Oncology

## 2018-01-24 ENCOUNTER — Encounter: Payer: Self-pay | Admitting: Oncology

## 2018-01-24 ENCOUNTER — Inpatient Hospital Stay (HOSPITAL_BASED_OUTPATIENT_CLINIC_OR_DEPARTMENT_OTHER): Payer: Medicare Other | Admitting: Oncology

## 2018-01-24 ENCOUNTER — Inpatient Hospital Stay: Payer: Medicare Other

## 2018-01-24 VITALS — BP 125/75 | HR 55 | Temp 97.8°F | Resp 18 | Wt 188.0 lb

## 2018-01-24 DIAGNOSIS — C182 Malignant neoplasm of ascending colon: Secondary | ICD-10-CM | POA: Insufficient documentation

## 2018-01-24 DIAGNOSIS — D701 Agranulocytosis secondary to cancer chemotherapy: Secondary | ICD-10-CM | POA: Insufficient documentation

## 2018-01-24 DIAGNOSIS — D649 Anemia, unspecified: Secondary | ICD-10-CM

## 2018-01-24 DIAGNOSIS — Z5111 Encounter for antineoplastic chemotherapy: Secondary | ICD-10-CM | POA: Diagnosis present

## 2018-01-24 DIAGNOSIS — Z79899 Other long term (current) drug therapy: Secondary | ICD-10-CM | POA: Insufficient documentation

## 2018-01-24 DIAGNOSIS — Z7689 Persons encountering health services in other specified circumstances: Secondary | ICD-10-CM | POA: Insufficient documentation

## 2018-01-24 DIAGNOSIS — Z95828 Presence of other vascular implants and grafts: Secondary | ICD-10-CM

## 2018-01-24 DIAGNOSIS — D638 Anemia in other chronic diseases classified elsewhere: Secondary | ICD-10-CM

## 2018-01-24 DIAGNOSIS — C189 Malignant neoplasm of colon, unspecified: Secondary | ICD-10-CM

## 2018-01-24 DIAGNOSIS — T451X5A Adverse effect of antineoplastic and immunosuppressive drugs, initial encounter: Secondary | ICD-10-CM

## 2018-01-24 LAB — COMPREHENSIVE METABOLIC PANEL
ALBUMIN: 3.5 g/dL (ref 3.5–5.0)
ALK PHOS: 46 U/L (ref 38–126)
ALT: 17 U/L (ref 14–54)
AST: 25 U/L (ref 15–41)
Anion gap: 5 (ref 5–15)
BUN: 7 mg/dL (ref 6–20)
CO2: 24 mmol/L (ref 22–32)
Calcium: 8.9 mg/dL (ref 8.9–10.3)
Chloride: 106 mmol/L (ref 101–111)
Creatinine, Ser: 0.62 mg/dL (ref 0.44–1.00)
GFR calc Af Amer: >60 mL/min (ref >60)
GFR calc non Af Amer: >60 mL/min (ref >60)
GLUCOSE: 110 mg/dL — AB (ref 65–99)
POTASSIUM: 4.5 mmol/L (ref 3.5–5.1)
SODIUM: 135 mmol/L (ref 135–145)
Total Bilirubin: 0.7 mg/dL (ref 0.3–1.2)
Total Protein: 6.4 g/dL — ABNORMAL LOW (ref 6.5–8.1)

## 2018-01-24 LAB — CBC WITH DIFFERENTIAL/PLATELET
Basophils Absolute: 0 10*3/uL (ref 0–0.1)
Basophils Relative: 1 %
Eosinophils Absolute: 0 10*3/uL (ref 0–0.7)
Eosinophils Relative: 2 %
HEMATOCRIT: 32.4 % — AB (ref 35.0–47.0)
Hemoglobin: 10.8 g/dL — ABNORMAL LOW (ref 12.0–16.0)
LYMPHS ABS: 1 10*3/uL (ref 1.0–3.6)
LYMPHS PCT: 47 %
MCH: 28.4 pg (ref 26.0–34.0)
MCHC: 33.3 g/dL (ref 32.0–36.0)
MCV: 85.2 fL (ref 80.0–100.0)
MONO ABS: 0.3 10*3/uL (ref 0.2–0.9)
MONOS PCT: 16 %
NEUTROS ABS: 0.7 10*3/uL — AB (ref 1.4–6.5)
Neutrophils Relative %: 34 %
Platelets: 140 10*3/uL — ABNORMAL LOW (ref 150–440)
RBC: 3.8 MIL/uL (ref 3.80–5.20)
RDW: 16 % — AB (ref 11.5–14.5)
WBC: 2.1 10*3/uL — ABNORMAL LOW (ref 3.6–11.0)

## 2018-01-24 MED ORDER — HEPARIN SOD (PORK) LOCK FLUSH 100 UNIT/ML IV SOLN
INTRAVENOUS | Status: AC
  Filled 2018-01-24: qty 5

## 2018-01-24 MED ORDER — HEPARIN SOD (PORK) LOCK FLUSH 100 UNIT/ML IV SOLN
500.0000 [IU] | Freq: Once | INTRAVENOUS | Status: AC
  Administered 2018-01-24: 500 [IU] via INTRAVENOUS

## 2018-01-24 NOTE — Progress Notes (Signed)
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 She presented to the ER with symptoms of dizziness mostly when she stands up. She has had problems with constipation in the past and takes prune juice to relieve it. Patient is a poor historian at baseline and complained of abdominal pain in ER which led to CT abdomen. 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  CT chest without contrast showed tiny 2 mm right upper lobe pulmonary nodule. No other evidence of metastatic disease. Baseline CEA elevated at 4.8  Patient underwent exploratory laparotomy with right hemicolectomy and terminal ileum resection as well as separate small bowel resection on 12/06/2017. Operative findings showed a large cecal tumor with extramural spread to retroperitoneum mesentery, mesentery lymph nodes and parietal peritoneum. Dense inflammatory response of the retroperitoneum and small bowel.There was a second portion of small bowel that was involved and was resected. A small portion of free tumor was identified in the retroperitoneum which was sent off for examination as well.  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 Radial margin positive for invasive carcinoma 2 colon adenomas 2.5 and 1.8 cm of the ascending colon. Metastatic adenocarcinoma in 4 out  of 13 lymph nodes. 2 tumor deposits Segment of small intestine with abscess which also showed adenocarcinoma invading the distal appendix with perforation. One mesenteric lymph node negative for malignancy. Retroperitoneal tumor removal was also positive for mucinous adenocarcinoma. Tumor was grade 2. Proximal and distal margins were negative.pT4bpN2a. MSI stable  Case discussed at tumor board. PET CT will not be done at this time as it may represent post surgical changes. Radiation oncology does not recommend adjuvant RT upon completion of chemotherapy. Plan is to receive 12 cycles or 6 months of adjuvant FOLFOX chemotherapy    Interval history- she tolerated cycle 1 of folfox well. Did not have any fatigue, nausea or vomiting. Bowel movements are regular. Denies any tingling, numbness. Denies any fever  ECOG PS- 1 Pain scale- 0   Review of systems- Review of Systems  Constitutional: Negative for chills, fever, malaise/fatigue and weight loss.  HENT: Negative for congestion, ear discharge and nosebleeds.   Eyes: Negative for blurred vision.  Respiratory: Negative for cough, hemoptysis, sputum production, shortness of breath and wheezing.   Cardiovascular: Negative for chest pain, palpitations, orthopnea and claudication.  Gastrointestinal: Negative for abdominal pain, blood in stool, constipation, diarrhea, heartburn, melena, nausea and vomiting.  Genitourinary: Negative for dysuria, flank pain, frequency, hematuria and urgency.  Musculoskeletal: Negative for back pain, joint pain and myalgias.  Skin: Negative for rash.  Neurological: Negative for dizziness, tingling, focal weakness, seizures, weakness and headaches.  Endo/Heme/Allergies: Does not bruise/bleed easily.  Psychiatric/Behavioral: Negative for depression and suicidal ideas. The patient does not have insomnia.       No Known Allergies   Past Medical History:  Diagnosis Date  . Cancer [facility])      Past Surgical  History:  Procedure Laterality Date  . COLOSTOMY REVISION N/A 12/06/2017   Procedure: COLON RESECTION RIGHT;  Surgeon: Florene Glen, MD;  Location: [facility] ORS;  Service: General;  Laterality: N/A;  . DILATION AND CURETTAGE, DIAGNOSTIC / THERAPEUTIC    . PORTACATH PLACEMENT N/A 01/06/2018   Procedure: INSERTION PORT-A-CATH;  Surgeon: Vickie Epley, MD;  Location: [facility] ORS;  Service: General;  Laterality: N/A;    Social History   Socioeconomic History  . Marital status: Widowed    Spouse name: Not on file  . Number of children: Not on file  . Years of education: Not on file  . Highest education level: Not on file  Social Needs  . Financial resource strain: Patient refused  . Food insecurity - worry: Patient refused  . Food insecurity - inability: Patient refused  . Transportation needs - medical: Patient refused  . Transportation needs - non-medical: Patient refused  Occupational History  . Not on file  Tobacco Use  . Smoking status: Former Research scientist (life sciences)  . Smokeless tobacco: Never Used  Substance and Sexual Activity  . Alcohol use: No  . Drug use: No  . Sexual activity: No  Other Topics Concern  . Not on file  Social History Narrative  . Not on file    Family History  Problem Relation Age of Onset  . ALS Mother   . ALS Father      Current Outpatient Medications:  .  acetaminophen (TYLENOL) 500 MG tablet, Take 1,000 mg by mouth every 8 (eight) hours as needed for mild pain., Disp: , Rfl:  .  alendronate (FOSAMAX) 70 MG tablet, Take 70 mg by mouth once a week., Disp: , Rfl:  .  ARIPiprazole (ABILIFY) 5 MG tablet, Take 5 mg by mouth at bedtime. , Disp: , Rfl:  .  benztropine (COGENTIN) 0.5 MG tablet, Take 0.5 mg by mouth at bedtime., Disp: , Rfl:  .  Calcium 600-200 MG-UNIT tablet, Take 1 tablet by mouth daily., Disp: , Rfl:  .  calcium carbonate (TUMS - DOSED IN MG ELEMENTAL CALCIUM) 500 MG chewable tablet, Chew 1 tablet by mouth 2 (two) times daily as needed for  indigestion or heartburn., Disp: , Rfl:  .  dexamethasone (DECADRON) 4 MG tablet, Take 1 tablet (4 mg total) by mouth 2 (two) times daily with a meal. Starting the day after chemotherapy for 2 days, Disp: 30 tablet, Rfl: 0 .  divalproex (DEPAKOTE) 500 MG DR tablet, Take 500 mg by mouth at bedtime. , Disp: , Rfl:  .  lidocaine-prilocaine (EMLA) cream, Apply 1 application topically as needed. 1 hour prior to coming for each chemotherapy treatment. Place small amount over port site and place saran wrap over the cream to protect the clothing, Disp: 30 g, Rfl: 0 .  memantine (NAMENDA XR) 14 MG CP24 24 hr capsule, Take 14 mg by mouth at bedtime. , Disp: , Rfl:  .  metoprolol succinate (TOPROL-XL) 50 MG 24 hr tablet, Take 50 mg by mouth every morning. Take with or immediately following a meal. , Disp: , Rfl:  .  Multiple Vitamin (MULTIVITAMIN WITH MINERALS) TABS tablet, Take 1 tablet by mouth daily. (Patient taking differently: Take 1 tablet by mouth daily. ), Disp: 30 tablet, Rfl: 0 .  ondansetron (ZOFRAN) 8 MG tablet, Take 1 tablet (8 mg total) by mouth 2 (two) times daily as needed for nausea or vomiting. Can start using on day 3 after chemo, Disp: 20 tablet, Rfl: 0 .  perphenazine (  TRILAFON) 4 MG tablet, Take 4 mg by mouth 2 (two) times daily., Disp: , Rfl:  .  prochlorperazine (COMPAZINE) 10 MG tablet, Take 1 tablet (10 mg total) by mouth every 6 (six) hours as needed for nausea or vomiting., Disp: 30 tablet, Rfl: 0  Physical exam:  Vitals:   01/24/18 1015 01/24/18 1017  BP:  125/75  Pulse:  (!) 55  Resp:  18  Temp:  97.8 F (36.6 C)  TempSrc:  Tympanic  Weight: 188 lb (85.3 kg)    Physical Exam  Constitutional: She is oriented to person, place, and time and well-developed, well-nourished, and in no distress.  HENT:  Head: Normocephalic and atraumatic.  Eyes: EOM are normal. Pupils are equal, round, and reactive to light.  Neck: Normal range of motion.  Cardiovascular: Normal rate, regular  rhythm and normal heart sounds.  Pulmonary/Chest: Effort normal and breath sounds normal.  Abdominal: Soft. Bowel sounds are normal.  horizontal scar in the rid mid abdomen has healed well  Neurological: She is alert and oriented to person, place, and time.  Skin: Skin is warm and dry.     CMP Latest Ref Rng & Units 01/24/2018  Glucose 65 - 99 mg/dL 110(H)  BUN 6 - 20 mg/dL 7  Creatinine 0.44 - 1.00 mg/dL 0.62  Sodium 135 - 145 mmol/L 135  Potassium 3.5 - 5.1 mmol/L 4.5  Chloride 101 - 111 mmol/L 106  CO2 22 - 32 mmol/L 24  Calcium 8.9 - 10.3 mg/dL 8.9  Total Protein 6.5 - 8.1 g/dL 6.4(L)  Total Bilirubin 0.3 - 1.2 mg/dL 0.7  Alkaline Phos 38 - 126 U/L 46  AST 15 - 41 U/L 25  ALT 14 - 54 U/L 17   CBC Latest Ref Rng & Units 01/24/2018  WBC 3.6 - 11.0 K/uL 2.1(L)  Hemoglobin 12.0 - 16.0 g/dL 10.8(L)  Hematocrit 35.0 - 47.0 % 32.4(L)  Platelets 150 - 440 K/uL 140(L)    No images are attached to the encounter.  Dg Chest Port 1 View  Result Date: 01/06/2018 CLINICAL DATA:  Central venous catheter. EXAM: PORTABLE CHEST 1 VIEW COMPARISON:  CT 12/10/2016. FINDINGS: PowerPort catheter noted with tip over the superior vena cava. Stable cardiomegaly. No pulmonary venous congestion. Mild basilar atelectasis. No pleural effusion or pneumothorax. IMPRESSION: 1.  PowerPort catheter noted with tip over the superior vena cava. 2.  Mild bibasilar atelectasis and/or scarring. 3.  Cardiomegaly with normal pulmonary vascularity. Electronically Signed   By: Marcello Moores  Register   On: 01/06/2018 13:58   Dg C-arm 1-60 Min-no Report  Result Date: 01/06/2018 Fluoroscopy was utilized by the requesting physician.  No radiographic interpretation.     Assessment and plan- Patient is a 72 y.o. female at least stage IIIC pT4b pN2aMxstatus post hemicolectomy here for on treatment assessment prior to cycle 2 of FOLFOX chemotherapy  Overall patient tolerated cycle 1 of FOLFOX chemotherapy well without any  significant side effects.  Today her white count is 2.1 with an ANC of 0.7.  I will therefore hold off on chemotherapy today.  I will see her back in 1 week's time in [place] on 01/30/2018 for cycle 2 of FOLFOX chemotherapy I will plan to give her Neulasta on day 3 of pump disconnect with her subsequent chemo  Chemo induced neutropenia- continue to monitor. It should resolve in 1 week  Normocytic anemia- likely due to chronic disease. Iron studies normal. Will add B12 and folate with next set of labs  Thrombocytopenia- secondary  to chemotherapy. Continue to monitor   Visit Diagnosis 1. Malignant neoplasm of ascending colon ([place])   2. Chemotherapy induced neutropenia ([facility])   3. Anemia of chronic disease      Dr. Randa Evens, MD, MPH [facility] at [facility] Pager- [phone] 01/24/2018 10:49 AM

## 2018-01-30 ENCOUNTER — Inpatient Hospital Stay: Payer: Medicare Other

## 2018-01-30 ENCOUNTER — Encounter: Payer: Self-pay | Admitting: Oncology

## 2018-01-30 ENCOUNTER — Inpatient Hospital Stay (HOSPITAL_BASED_OUTPATIENT_CLINIC_OR_DEPARTMENT_OTHER): Payer: Medicare Other | Admitting: Oncology

## 2018-01-30 VITALS — BP 144/83 | HR 69 | Temp 97.0°F | Resp 18 | Ht 65.0 in | Wt 189.6 lb

## 2018-01-30 DIAGNOSIS — D701 Agranulocytosis secondary to cancer chemotherapy: Secondary | ICD-10-CM | POA: Diagnosis not present

## 2018-01-30 DIAGNOSIS — D649 Anemia, unspecified: Secondary | ICD-10-CM | POA: Diagnosis not present

## 2018-01-30 DIAGNOSIS — Z79899 Other long term (current) drug therapy: Secondary | ICD-10-CM

## 2018-01-30 DIAGNOSIS — C182 Malignant neoplasm of ascending colon: Secondary | ICD-10-CM

## 2018-01-30 DIAGNOSIS — T451X5A Adverse effect of antineoplastic and immunosuppressive drugs, initial encounter: Secondary | ICD-10-CM

## 2018-01-30 DIAGNOSIS — Z5111 Encounter for antineoplastic chemotherapy: Secondary | ICD-10-CM | POA: Diagnosis not present

## 2018-01-30 DIAGNOSIS — C189 Malignant neoplasm of colon, unspecified: Secondary | ICD-10-CM

## 2018-01-30 DIAGNOSIS — Z95828 Presence of other vascular implants and grafts: Secondary | ICD-10-CM

## 2018-01-30 LAB — COMPREHENSIVE METABOLIC PANEL
ALT: 22 U/L (ref 14–54)
ANION GAP: 7 (ref 5–15)
AST: 26 U/L (ref 15–41)
Albumin: 3.6 g/dL (ref 3.5–5.0)
Alkaline Phosphatase: 57 U/L (ref 38–126)
BUN: 7 mg/dL (ref 6–20)
CHLORIDE: 105 mmol/L (ref 101–111)
CO2: 23 mmol/L (ref 22–32)
CREATININE: 0.72 mg/dL (ref 0.44–1.00)
Calcium: 8.9 mg/dL (ref 8.9–10.3)
Glucose, Bld: 116 mg/dL — ABNORMAL HIGH (ref 65–99)
Potassium: 3.8 mmol/L (ref 3.5–5.1)
Sodium: 135 mmol/L (ref 135–145)
Total Bilirubin: 0.7 mg/dL (ref 0.3–1.2)
Total Protein: 6.5 g/dL (ref 6.5–8.1)

## 2018-01-30 LAB — CBC WITH DIFFERENTIAL/PLATELET
BASOS ABS: 0.1 10*3/uL (ref 0–0.1)
Basophils Relative: 3 %
EOS PCT: 3 %
Eosinophils Absolute: 0.1 10*3/uL (ref 0–0.7)
HCT: 36 % (ref 35.0–47.0)
Hemoglobin: 12.1 g/dL (ref 12.0–16.0)
LYMPHS ABS: 1.3 10*3/uL (ref 1.0–3.6)
LYMPHS PCT: 51 %
MCH: 28.4 pg (ref 26.0–34.0)
MCHC: 33.6 g/dL (ref 32.0–36.0)
MCV: 84.5 fL (ref 80.0–100.0)
MONO ABS: 0.6 10*3/uL (ref 0.2–0.9)
Monocytes Relative: 23 %
Neutro Abs: 0.5 10*3/uL — ABNORMAL LOW (ref 1.4–6.5)
Neutrophils Relative %: 20 %
PLATELETS: 257 10*3/uL (ref 150–440)
RBC: 4.26 MIL/uL (ref 3.80–5.20)
RDW: 17.2 % — AB (ref 11.5–14.5)
WBC: 2.5 10*3/uL — ABNORMAL LOW (ref 3.6–11.0)

## 2018-01-30 LAB — VITAMIN B12: Vitamin B-12: 266 pg/mL (ref 180–914)

## 2018-01-30 LAB — FOLATE: Folate: 32 ng/mL (ref >5.9)

## 2018-01-30 MED ORDER — SODIUM CHLORIDE 0.9% FLUSH
10.0000 mL | INTRAVENOUS | Status: DC | PRN
  Administered 2018-01-30: 10 mL via INTRAVENOUS
  Filled 2018-01-30: qty 10

## 2018-01-30 MED ORDER — TBO-FILGRASTIM 480 MCG/0.8ML ~~LOC~~ SOSY
480.0000 ug | PREFILLED_SYRINGE | Freq: Every day | SUBCUTANEOUS | Status: DC
  Administered 2018-01-30: 480 ug via SUBCUTANEOUS
  Filled 2018-01-30: qty 0.8

## 2018-01-30 MED ORDER — HEPARIN SOD (PORK) LOCK FLUSH 100 UNIT/ML IV SOLN
500.0000 [IU] | Freq: Once | INTRAVENOUS | Status: AC
  Administered 2018-01-30: 500 [IU] via INTRAVENOUS
  Filled 2018-01-30: qty 5

## 2018-01-30 NOTE — Addendum Note (Signed)
Addended by: Luella Cook on: 01/30/2018 09:30 AM   Modules accepted: Orders

## 2018-01-30 NOTE — Progress Notes (Signed)
No new changes noted today 

## 2018-01-30 NOTE — Progress Notes (Signed)
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 She presented to the ER with symptoms of dizziness mostly when she stands up. She has had problems with constipation in the past and takes prune juice to relieve it. Patient is a poor historian at baseline and complained of abdominal pain in ER which led to CT abdomen. 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  CT chest without contrast showed tiny 2 mm right upper lobe pulmonary nodule. No other evidence of metastatic disease. Baseline CEA elevated at 4.8  Patient underwent exploratory laparotomy with right hemicolectomy and terminal ileum resection as well as separate small bowel resection on 12/06/2017. Operative findings showed a large cecal tumor with extramural spread to retroperitoneum mesentery, mesentery lymph nodes and parietal peritoneum. Dense inflammatory response of the retroperitoneum and small bowel.There was a second portion of small bowel that was involved and was resected. A small portion of free tumor was identified in the retroperitoneum which was sent off for examination as well.  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 Radial margin positive for invasive carcinoma 2 colon adenomas 2.5 and 1.8 cm of the ascending colon. Metastatic adenocarcinoma in 4 out  of 13 lymph nodes. 2 tumor deposits Segment of small intestine with abscess which also showed adenocarcinoma invading the distal appendix with perforation. One mesenteric lymph node negative for malignancy. Retroperitoneal tumor removal was also positive for mucinous adenocarcinoma. Tumor was grade 2. Proximal and distal margins were negative.pT4bpN2a. MSI stable  Case discussed at tumor board. PET CT will not be done at this time as it may represent post surgical changes. Radiation oncology does not recommend adjuvant RT upon completion of chemotherapy.Plan is to receive 12 cycles or 6 months of adjuvant FOLFOX chemotherapy Interval history- doing well at her group home. Denies any fatigue, fever, nausea or vomiting. Her appetite is good. Bowel movements have been regular  ECOG PS- 1 Pain scale- 0  Review of systems- Review of Systems  Constitutional: Negative for chills, fever, malaise/fatigue and weight loss.  HENT: Negative for congestion, ear discharge and nosebleeds.   Eyes: Negative for blurred vision.  Respiratory: Negative for cough, hemoptysis, sputum production, shortness of breath and wheezing.   Cardiovascular: Negative for chest pain, palpitations, orthopnea and claudication.  Gastrointestinal: Negative for abdominal pain, blood in stool, constipation, diarrhea, heartburn, melena, nausea and vomiting.  Genitourinary: Negative for dysuria, flank pain, frequency, hematuria and urgency.  Musculoskeletal: Negative for back pain, joint pain and myalgias.  Skin: Negative for rash.  Neurological: Negative for dizziness, tingling, focal weakness, seizures, weakness and headaches.  Endo/Heme/Allergies: Does not bruise/bleed easily.  Psychiatric/Behavioral: Negative for depression and suicidal ideas. The patient does not have insomnia.       No Known Allergies   Past Medical History:  Diagnosis Date  . Cancer [facility])      Past Surgical History:  Procedure Laterality Date    . COLOSTOMY  REVISION N/A 12/06/2017   Procedure: COLON RESECTION RIGHT;  Surgeon: Florene Glen, MD;  Location: [facility] ORS;  Service: General;  Laterality: N/A;  . DILATION AND CURETTAGE, DIAGNOSTIC / THERAPEUTIC    . PORTACATH PLACEMENT N/A 01/06/2018   Procedure: INSERTION PORT-A-CATH;  Surgeon: Vickie Epley, MD;  Location: [facility] ORS;  Service: General;  Laterality: N/A;    Social History   Socioeconomic History  . Marital status: Widowed    Spouse name: Not on file  . Number of children: Not on file  . Years of education: Not on file  . Highest education level: Not on file  Social Needs  . Financial resource strain: Patient refused  . Food insecurity - worry: Patient refused  . Food insecurity - inability: Patient refused  . Transportation needs - medical: Patient refused  . Transportation needs - non-medical: Patient refused  Occupational History  . Not on file  Tobacco Use  . Smoking status: Former Research scientist (life sciences)  . Smokeless tobacco: Never Used  Substance and Sexual Activity  . Alcohol use: No  . Drug use: No  . Sexual activity: No  Other Topics Concern  . Not on file  Social History Narrative  . Not on file    Family History  Problem Relation Age of Onset  . ALS Mother   . ALS Father      Current Outpatient Medications:  .  acetaminophen (TYLENOL) 500 MG tablet, Take 1,000 mg by mouth every 8 (eight) hours as needed for mild pain., Disp: , Rfl:  .  alendronate (FOSAMAX) 70 MG tablet, Take 70 mg by mouth once a week., Disp: , Rfl:  .  ARIPiprazole (ABILIFY) 5 MG tablet, Take 5 mg by mouth at bedtime. , Disp: , Rfl:  .  benztropine (COGENTIN) 0.5 MG tablet, Take 0.5 mg by mouth at bedtime., Disp: , Rfl:  .  Calcium 600-200 MG-UNIT tablet, Take 1 tablet by mouth daily., Disp: , Rfl:  .  calcium carbonate (TUMS - DOSED IN MG ELEMENTAL CALCIUM) 500 MG chewable tablet, Chew 1 tablet by mouth 2 (two) times daily as needed for indigestion or heartburn., Disp: , Rfl:  .   dexamethasone (DECADRON) 4 MG tablet, Take 1 tablet (4 mg total) by mouth 2 (two) times daily with a meal. Starting the day after chemotherapy for 2 days, Disp: 30 tablet, Rfl: 0 .  divalproex (DEPAKOTE) 500 MG DR tablet, Take 500 mg by mouth at bedtime. , Disp: , Rfl:  .  lidocaine-prilocaine (EMLA) cream, Apply 1 application topically as needed. 1 hour prior to coming for each chemotherapy treatment. Place small amount over port site and place saran wrap over the cream to protect the clothing, Disp: 30 g, Rfl: 0 .  memantine (NAMENDA XR) 14 MG CP24 24 hr capsule, Take 14 mg by mouth at bedtime. , Disp: , Rfl:  .  metoprolol succinate (TOPROL-XL) 50 MG 24 hr tablet, Take 50 mg by mouth every morning. Take with or immediately following a meal. , Disp: , Rfl:  .  Multiple Vitamin (MULTIVITAMIN WITH MINERALS) TABS tablet, Take 1 tablet by mouth daily. (Patient taking differently: Take 1 tablet by mouth daily. ), Disp: 30 tablet, Rfl: 0 .  ondansetron (ZOFRAN) 8 MG tablet, Take 1 tablet (8 mg total) by mouth 2 (two) times daily as needed for nausea or vomiting. Can start using on day 3 after chemo, Disp: 20 tablet, Rfl: 0 .  perphenazine (TRILAFON) 4 MG tablet, Take 4 mg by  mouth 2 (two) times daily., Disp: , Rfl:  .  prochlorperazine (COMPAZINE) 10 MG tablet, Take 1 tablet (10 mg total) by mouth every 6 (six) hours as needed for nausea or vomiting., Disp: 30 tablet, Rfl: 0 No current facility-administered medications for this visit.   Facility-Administered Medications Ordered in Other Visits:  .  heparin lock flush 100 unit/mL, 500 Units, Intravenous, Once, Sindy Guadeloupe, MD .  sodium chloride flush (NS) 0.9 % injection 10 mL, 10 mL, Intravenous, PRN, Sindy Guadeloupe, MD  Physical exam:  Vitals:   01/30/18 0856  BP: (!) 144/83  Pulse: 69  Resp: 18  Temp: (!) 97 F (36.1 C)  TempSrc: Tympanic  Weight: 189 lb 9.5 oz (86 kg)  Height: '5\' 5"'  (1.651 m)   Physical Exam  Constitutional: She is  oriented to person, place, and time and well-developed, well-nourished, and in no distress.  HENT:  Head: Normocephalic and atraumatic.  Eyes: EOM are normal. Pupils are equal, round, and reactive to light.  Neck: Normal range of motion.  Cardiovascular: Normal rate, regular rhythm and normal heart sounds.  Pulmonary/Chest: Effort normal and breath sounds normal.  Abdominal: Soft. Bowel sounds are normal.  Surgical scar well healed  Neurological: She is alert and oriented to person, place, and time.  Skin: Skin is warm and dry.     CMP Latest Ref Rng & Units 01/24/2018  Glucose 65 - 99 mg/dL 110(H)  BUN 6 - 20 mg/dL 7  Creatinine 0.44 - 1.00 mg/dL 0.62  Sodium 135 - 145 mmol/L 135  Potassium 3.5 - 5.1 mmol/L 4.5  Chloride 101 - 111 mmol/L 106  CO2 22 - 32 mmol/L 24  Calcium 8.9 - 10.3 mg/dL 8.9  Total Protein 6.5 - 8.1 g/dL 6.4(L)  Total Bilirubin 0.3 - 1.2 mg/dL 0.7  Alkaline Phos 38 - 126 U/L 46  AST 15 - 41 U/L 25  ALT 14 - 54 U/L 17   CBC Latest Ref Rng & Units 01/30/2018  WBC 3.6 - 11.0 K/uL 2.5(L)  Hemoglobin 12.0 - 16.0 g/dL 12.1  Hematocrit 35.0 - 47.0 % 36.0  Platelets 150 - 440 K/uL 257    No images are attached to the encounter.  Dg Chest Port 1 View  Result Date: 01/06/2018 CLINICAL DATA:  Central venous catheter. EXAM: PORTABLE CHEST 1 VIEW COMPARISON:  CT 12/10/2016. FINDINGS: PowerPort catheter noted with tip over the superior vena cava. Stable cardiomegaly. No pulmonary venous congestion. Mild basilar atelectasis. No pleural effusion or pneumothorax. IMPRESSION: 1.  PowerPort catheter noted with tip over the superior vena cava. 2.  Mild bibasilar atelectasis and/or scarring. 3.  Cardiomegaly with normal pulmonary vascularity. Electronically Signed   By: Marcello Moores  Register   On: 01/06/2018 13:58   Dg C-arm 1-60 Min-no Report  Result Date: 01/06/2018 Fluoroscopy was utilized by the requesting physician.  No radiographic interpretation.     Assessment and plan-  Patient is a 72 y.o. female   at least stage IIIC pT4b pN2aMxstatus post hemicolectomyhere for on treatment assessment prior to cycle 2 of FOLFOX chemotherapy  Chemo induced neutropenia persists after 1 week. ANC 0.5 today. She theerfore cannot get chemo today. I will plan to give her neupogen today and tomorrow. Check cbc with diff on 02/01/18 and decide about neupogen based on counts. Neutropenic precautions advised.   rtc in 1 week with cbc for cycle 2 of FOLFOX with neulasta support   Visit Diagnosis 1. Colon adenocarcinoma ([place])   2. Chemotherapy induced  neutropenia ([place])      Dr. Randa Evens, MD, MPH [facility] at [facility] Pager- [phone] 01/30/2018 9:07 AM

## 2018-01-31 ENCOUNTER — Inpatient Hospital Stay: Payer: Medicare Other

## 2018-01-31 VITALS — BP 145/83 | HR 68 | Temp 97.4°F | Resp 18

## 2018-01-31 DIAGNOSIS — Z5111 Encounter for antineoplastic chemotherapy: Secondary | ICD-10-CM | POA: Diagnosis not present

## 2018-01-31 DIAGNOSIS — T451X5A Adverse effect of antineoplastic and immunosuppressive drugs, initial encounter: Principal | ICD-10-CM

## 2018-01-31 DIAGNOSIS — D701 Agranulocytosis secondary to cancer chemotherapy: Secondary | ICD-10-CM

## 2018-01-31 MED ORDER — TBO-FILGRASTIM 480 MCG/0.8ML ~~LOC~~ SOSY
480.0000 ug | PREFILLED_SYRINGE | Freq: Every day | SUBCUTANEOUS | Status: DC
  Administered 2018-01-31: 480 ug via SUBCUTANEOUS

## 2018-02-01 ENCOUNTER — Telehealth: Payer: Self-pay | Admitting: *Deleted

## 2018-02-01 ENCOUNTER — Encounter: Payer: Self-pay | Admitting: Oncology

## 2018-02-01 ENCOUNTER — Inpatient Hospital Stay: Payer: Medicare Other

## 2018-02-01 DIAGNOSIS — Z5111 Encounter for antineoplastic chemotherapy: Secondary | ICD-10-CM | POA: Diagnosis not present

## 2018-02-01 LAB — CBC WITH DIFFERENTIAL/PLATELET
BASOS ABS: 0 10*3/uL (ref 0–0.1)
Band Neutrophils: 13 %
Basophils Relative: 0 %
Eosinophils Absolute: 0.3 10*3/uL (ref 0–0.7)
Eosinophils Relative: 1 %
HCT: 37.9 % (ref 35.0–47.0)
HEMOGLOBIN: 12.5 g/dL (ref 12.0–16.0)
LYMPHS ABS: 2.8 10*3/uL (ref 1.0–3.6)
LYMPHS PCT: 9 %
MCH: 27.9 pg (ref 26.0–34.0)
MCHC: 33 g/dL (ref 32.0–36.0)
MCV: 84.5 fL (ref 80.0–100.0)
MONO ABS: 4.4 10*3/uL — AB (ref 0.2–0.9)
Monocytes Relative: 14 %
NEUTROS PCT: 63 %
Neutro Abs: 24.1 10*3/uL — ABNORMAL HIGH (ref 1.4–6.5)
PLATELETS: 266 10*3/uL (ref 150–440)
RBC: 4.48 MIL/uL (ref 3.80–5.20)
RDW: 18 % — ABNORMAL HIGH (ref 11.5–14.5)
WBC: 31.6 10*3/uL — AB (ref 3.6–11.0)

## 2018-02-01 NOTE — Progress Notes (Deleted)
Lab drawn, neutrophil count elevated from neupogen shots and patient does not need shot today.

## 2018-02-01 NOTE — Telephone Encounter (Signed)
Called down to [facility] and asked if patient got her injection neupogen today. They said no her ANC was too high to get injection. Dr. Janese Banks wanted me to call and check and I did and I spoke to [facility]

## 2018-02-05 ENCOUNTER — Other Ambulatory Visit: Payer: Self-pay | Admitting: *Deleted

## 2018-02-05 DIAGNOSIS — C189 Malignant neoplasm of colon, unspecified: Secondary | ICD-10-CM

## 2018-02-06 ENCOUNTER — Inpatient Hospital Stay: Payer: Medicare Other

## 2018-02-06 ENCOUNTER — Inpatient Hospital Stay (HOSPITAL_BASED_OUTPATIENT_CLINIC_OR_DEPARTMENT_OTHER): Payer: Medicare Other | Admitting: Oncology

## 2018-02-06 ENCOUNTER — Encounter: Payer: Self-pay | Admitting: Oncology

## 2018-02-06 ENCOUNTER — Other Ambulatory Visit: Payer: Self-pay | Admitting: *Deleted

## 2018-02-06 VITALS — BP 146/83 | HR 57 | Temp 97.3°F | Resp 18 | Wt 188.9 lb

## 2018-02-06 DIAGNOSIS — Z5111 Encounter for antineoplastic chemotherapy: Secondary | ICD-10-CM

## 2018-02-06 DIAGNOSIS — Z79899 Other long term (current) drug therapy: Secondary | ICD-10-CM | POA: Diagnosis not present

## 2018-02-06 DIAGNOSIS — C182 Malignant neoplasm of ascending colon: Secondary | ICD-10-CM

## 2018-02-06 DIAGNOSIS — D649 Anemia, unspecified: Secondary | ICD-10-CM | POA: Diagnosis not present

## 2018-02-06 DIAGNOSIS — C189 Malignant neoplasm of colon, unspecified: Secondary | ICD-10-CM

## 2018-02-06 LAB — CBC WITH DIFFERENTIAL/PLATELET
BASOS ABS: 0.1 10*3/uL (ref 0–0.1)
BASOS PCT: 1 %
Eosinophils Absolute: 0 10*3/uL (ref 0–0.7)
Eosinophils Relative: 1 %
HEMATOCRIT: 36.8 % (ref 35.0–47.0)
HEMOGLOBIN: 12.4 g/dL (ref 12.0–16.0)
Lymphocytes Relative: 28 %
Lymphs Abs: 1.6 10*3/uL (ref 1.0–3.6)
MCH: 28.2 pg (ref 26.0–34.0)
MCHC: 33.6 g/dL (ref 32.0–36.0)
MCV: 83.8 fL (ref 80.0–100.0)
Monocytes Absolute: 0.8 10*3/uL (ref 0.2–0.9)
Monocytes Relative: 15 %
NEUTROS ABS: 3.1 10*3/uL (ref 1.4–6.5)
NEUTROS PCT: 55 %
Platelets: 159 10*3/uL (ref 150–440)
RBC: 4.38 MIL/uL (ref 3.80–5.20)
RDW: 17.3 % — ABNORMAL HIGH (ref 11.5–14.5)
WBC: 5.7 10*3/uL (ref 3.6–11.0)

## 2018-02-06 LAB — COMPREHENSIVE METABOLIC PANEL
ALT: 20 U/L (ref 14–54)
ANION GAP: 12 (ref 5–15)
AST: 24 U/L (ref 15–41)
Albumin: 3.6 g/dL (ref 3.5–5.0)
Alkaline Phosphatase: 68 U/L (ref 38–126)
BUN: 6 mg/dL (ref 6–20)
CALCIUM: 8.4 mg/dL — AB (ref 8.9–10.3)
CO2: 23 mmol/L (ref 22–32)
Chloride: 100 mmol/L — ABNORMAL LOW (ref 101–111)
Creatinine, Ser: 0.67 mg/dL (ref 0.44–1.00)
GFR calc non Af Amer: >60 mL/min (ref >60)
Glucose, Bld: 102 mg/dL — ABNORMAL HIGH (ref 65–99)
POTASSIUM: 3.9 mmol/L (ref 3.5–5.1)
SODIUM: 135 mmol/L (ref 135–145)
TOTAL PROTEIN: 6.6 g/dL (ref 6.5–8.1)
Total Bilirubin: 0.5 mg/dL (ref 0.3–1.2)

## 2018-02-06 MED ORDER — DEXAMETHASONE SODIUM PHOSPHATE 10 MG/ML IJ SOLN
10.0000 mg | Freq: Once | INTRAMUSCULAR | Status: AC
  Administered 2018-02-06: 10 mg via INTRAVENOUS

## 2018-02-06 MED ORDER — LEUCOVORIN CALCIUM INJECTION 350 MG
800.0000 mg | Freq: Once | INTRAVENOUS | Status: AC
  Administered 2018-02-06: 800 mg via INTRAVENOUS
  Filled 2018-02-06: qty 40
  Filled 2018-02-06: qty 25

## 2018-02-06 MED ORDER — FLUOROURACIL CHEMO INJECTION 2.5 GM/50ML
400.0000 mg/m2 | Freq: Once | INTRAVENOUS | Status: AC
  Administered 2018-02-06: 800 mg via INTRAVENOUS
  Filled 2018-02-06 (×3): qty 16

## 2018-02-06 MED ORDER — DEXAMETHASONE SODIUM PHOSPHATE 10 MG/ML IJ SOLN
INTRAMUSCULAR | Status: AC
  Filled 2018-02-06: qty 1

## 2018-02-06 MED ORDER — PALONOSETRON HCL INJECTION 0.25 MG/5ML
0.2500 mg | Freq: Once | INTRAVENOUS | Status: AC
  Administered 2018-02-06: 0.25 mg via INTRAVENOUS

## 2018-02-06 MED ORDER — LORATADINE 10 MG PO TABS: 10.0000 mg | ORAL_TABLET | Freq: Every day | ORAL | 1 refills | Status: AC | PRN

## 2018-02-06 MED ORDER — PALONOSETRON HCL INJECTION 0.25 MG/5ML
INTRAVENOUS | Status: AC
  Filled 2018-02-06: qty 5

## 2018-02-06 MED ORDER — SODIUM CHLORIDE 0.9% FLUSH
10.0000 mL | INTRAVENOUS | Status: AC | PRN
  Administered 2018-02-06: 10 mL via INTRAVENOUS
  Filled 2018-02-06: qty 10

## 2018-02-06 MED ORDER — HEPARIN SOD (PORK) LOCK FLUSH 100 UNIT/ML IV SOLN: 500.0000 [IU] | Freq: Once | INTRAVENOUS | Status: AC

## 2018-02-06 MED ORDER — DEXTROSE 5 % IV SOLN
Freq: Once | INTRAVENOUS | Status: AC
  Administered 2018-02-06: 09:00:00 via INTRAVENOUS
  Filled 2018-02-06: qty 1000

## 2018-02-06 MED ORDER — FLUOROURACIL CHEMO INJECTION 5 GM/100ML
2400.0000 mg/m2 | INTRAVENOUS | Status: AC
  Administered 2018-02-06: 4800 mg via INTRAVENOUS
  Filled 2018-02-06 (×2): qty 96

## 2018-02-06 MED ORDER — SODIUM CHLORIDE 0.9% FLUSH
10.0000 mL | INTRAVENOUS | Status: DC | PRN
  Filled 2018-02-06: qty 10

## 2018-02-06 MED ORDER — OXALIPLATIN CHEMO INJECTION 100 MG/20ML
85.0000 mg/m2 | Freq: Once | INTRAVENOUS | Status: AC
  Administered 2018-02-06: 170 mg via INTRAVENOUS
  Filled 2018-02-06 (×2): qty 34

## 2018-02-06 NOTE — Progress Notes (Signed)
Patient here with caregiver today for labs and treatment with Folfox. She states that she is feeling well and denies having any pain.

## 2018-02-06 NOTE — Addendum Note (Signed)
Addended by: Luella Cook on: 02/06/2018 09:20 AM   Modules accepted: Orders

## 2018-02-06 NOTE — Progress Notes (Signed)
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 She presented to the ER with symptoms of dizziness mostly when she stands up. She has had problems with constipation in the past and takes prune juice to relieve it. Patient is a poor historian at baseline and complained of abdominal pain in ER which led to CT abdomen. 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  CT chest without contrast showed tiny 2 mm right upper lobe pulmonary nodule. No other evidence of metastatic disease. Baseline CEA elevated at 4.8  Patient underwent exploratory laparotomy with right hemicolectomy and terminal ileum resection as well as separate small bowel resection on 12/06/2017. Operative findings showed a large cecal tumor with extramural spread to retroperitoneum mesentery, mesentery lymph nodes and parietal peritoneum. Dense inflammatory response of the retroperitoneum and small bowel.There was a second portion of small bowel that was involved and was resected. A small portion of free tumor was identified in the retroperitoneum which was sent off for examination as well.  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 Radial margin positive for invasive carcinoma 2 colon adenomas 2.5 and 1.8 cm of the ascending colon. Metastatic adenocarcinoma in 4 out  of 13 lymph nodes. 2 tumor deposits Segment of small intestine with abscess which also showed adenocarcinoma invading the distal appendix with perforation. One mesenteric lymph node negative for malignancy. Retroperitoneal tumor removal was also positive for mucinous adenocarcinoma. Tumor was grade 2. Proximal and distal margins were negative.pT4bpN2a. MSI stable  Case discussed at tumor board. PET CT will not be done at this time as it may represent post surgical changes. Radiation oncology does not recommend adjuvant RT upon completion of chemotherapy.Plan is to receive 12 cycles or 6 months of adjuvant FOLFOX chemotherapy    Interval history- she is doing well. Her appetite is good. Bowel movements are regular. She denies any pain. Denies any fever, cough, tingling numbness in her extremities  ECOG PS- 1 Pain scale- 0 Opioid associated constipation-no  Review of systems- Review of Systems  Constitutional: Negative for chills, fever, malaise/fatigue and weight loss.  HENT: Negative for congestion, ear discharge and nosebleeds.   Eyes: Negative for blurred vision.  Respiratory: Negative for cough, hemoptysis, sputum production, shortness of breath and wheezing.   Cardiovascular: Negative for chest pain, palpitations, orthopnea and claudication.  Gastrointestinal: Negative for abdominal pain, blood in stool, constipation, diarrhea, heartburn, melena, nausea and vomiting.  Genitourinary: Negative for dysuria, flank pain, frequency, hematuria and urgency.  Musculoskeletal: Negative for back pain, joint pain and myalgias.  Skin: Negative for rash.  Neurological: Negative for dizziness, tingling, focal weakness, seizures, weakness and headaches.  Endo/Heme/Allergies: Does not bruise/bleed easily.  Psychiatric/Behavioral: Negative for depression and suicidal ideas. The patient does not have insomnia.       No Known Allergies   Past Medical History:  Diagnosis Date  . Cancer [facility])       Past Surgical History:  Procedure Laterality Date  . COLOSTOMY REVISION N/A 12/06/2017   Procedure: COLON RESECTION RIGHT;  Surgeon: Florene Glen, MD;  Location: [facility] ORS;  Service: General;  Laterality: N/A;  . DILATION AND CURETTAGE, DIAGNOSTIC / THERAPEUTIC    . PORTACATH PLACEMENT N/A 01/06/2018   Procedure: INSERTION PORT-A-CATH;  Surgeon: Vickie Epley, MD;  Location: [facility] ORS;  Service: General;  Laterality: N/A;    Social History   Socioeconomic History  . Marital status: Widowed    Spouse name: Not on file  . Number of children: Not on file  . Years of education: Not on file  . Highest education level: Not on file  Social Needs  . Financial resource strain: Patient refused  . Food insecurity - worry: Patient refused  . Food insecurity - inability: Patient refused  . Transportation needs - medical: Patient refused  . Transportation needs - non-medical: Patient refused  Occupational History  . Not on file  Tobacco Use  . Smoking status: Former Research scientist (life sciences)  . Smokeless tobacco: Never Used  Substance and Sexual Activity  . Alcohol use: No  . Drug use: No  . Sexual activity: No  Other Topics Concern  . Not on file  Social History Narrative  . Not on file    Family History  Problem Relation Age of Onset  . ALS Mother   . ALS Father      Current Outpatient Medications:  .  acetaminophen (TYLENOL) 500 MG tablet, Take 1,000 mg by mouth every 8 (eight) hours as needed for mild pain., Disp: , Rfl:  .  alendronate (FOSAMAX) 70 MG tablet, Take 70 mg by mouth once a week., Disp: , Rfl:  .  ARIPiprazole (ABILIFY) 5 MG tablet, Take 5 mg by mouth at bedtime. , Disp: , Rfl:  .  benztropine (COGENTIN) 0.5 MG tablet, Take 0.5 mg by mouth at bedtime., Disp: , Rfl:  .  Calcium 600-200 MG-UNIT tablet, Take 1 tablet by mouth daily., Disp: , Rfl:  .  calcium carbonate (TUMS - DOSED IN MG ELEMENTAL CALCIUM) 500 MG chewable tablet, Chew 1 tablet by mouth 2 (two) times  daily as needed for indigestion or heartburn., Disp: , Rfl:  .  dexamethasone (DECADRON) 4 MG tablet, Take 1 tablet (4 mg total) by mouth 2 (two) times daily with a meal. Starting the day after chemotherapy for 2 days, Disp: 30 tablet, Rfl: 0 .  divalproex (DEPAKOTE) 500 MG DR tablet, Take 500 mg by mouth at bedtime. , Disp: , Rfl:  .  lidocaine-prilocaine (EMLA) cream, Apply 1 application topically as needed. 1 hour prior to coming for each chemotherapy treatment. Place small amount over port site and place saran wrap over the cream to protect the clothing, Disp: 30 g, Rfl: 0 .  memantine (NAMENDA XR) 14 MG CP24 24 hr capsule, Take 14 mg by mouth at bedtime. , Disp: , Rfl:  .  metoprolol succinate (TOPROL-XL) 50 MG 24 hr tablet, Take 50 mg by mouth every morning. Take with or immediately following a meal. , Disp: , Rfl:  .  Multiple Vitamin (MULTIVITAMIN WITH MINERALS) TABS tablet, Take 1 tablet by mouth daily. (Patient taking differently: Take 1 tablet by mouth daily. ), Disp: 30 tablet, Rfl: 0 .  ondansetron (ZOFRAN) 8 MG tablet, Take 1 tablet (8 mg total) by mouth 2 (two) times daily as needed for nausea or vomiting. Can start using on day 3 after chemo, Disp: 20 tablet, Rfl: 0 .  perphenazine (TRILAFON) 4  MG tablet, Take 4 mg by mouth 2 (two) times daily., Disp: , Rfl:  .  prochlorperazine (COMPAZINE) 10 MG tablet, Take 1 tablet (10 mg total) by mouth every 6 (six) hours as needed for nausea or vomiting., Disp: 30 tablet, Rfl: 0 No current facility-administered medications for this visit.   Facility-Administered Medications Ordered in Other Visits:  .  fluorouracil (ADRUCIL) 4,800 mg in sodium chloride 0.9 % 54 mL chemo infusion, 2,400 mg/m2 (Treatment Plan Recorded), Intravenous, 1 day or 1 dose, Sindy Guadeloupe, MD .  fluorouracil (ADRUCIL) chemo injection 800 mg, 400 mg/m2 (Treatment Plan Recorded), Intravenous, Once, Sindy Guadeloupe, MD .  heparin lock flush 100 unit/mL, 500 Units, Intravenous,  Once, Sindy Guadeloupe, MD .  leucovorin 796 mg in dextrose 5 % 250 mL infusion, 400 mg/m2 (Treatment Plan Recorded), Intravenous, Once, Sindy Guadeloupe, MD .  oxaliplatin (ELOXATIN) 170 mg in dextrose 5 % 500 mL chemo infusion, 85 mg/m2 (Treatment Plan Recorded), Intravenous, Once, Sindy Guadeloupe, MD .  sodium chloride flush (NS) 0.9 % injection 10 mL, 10 mL, Intravenous, PRN, Sindy Guadeloupe, MD  Physical exam:  Vitals:   02/06/18 0854  BP: (!) 146/83  Pulse: (!) 57  Resp: 18  Temp: (!) 97.3 F (36.3 C)  TempSrc: Tympanic  Weight: 188 lb 15 oz (85.7 kg)   Physical Exam  Constitutional: She is oriented to person, place, and time and well-developed, well-nourished, and in no distress.  HENT:  Head: Normocephalic and atraumatic.  Eyes: EOM are normal. Pupils are equal, round, and reactive to light.  Neck: Normal range of motion.  Cardiovascular: Normal rate, regular rhythm and normal heart sounds.  Pulmonary/Chest: Effort normal and breath sounds normal.  Abdominal: Soft. Bowel sounds are normal.  Horizontal surgical wound in the right abdomen well healed  Neurological: She is alert and oriented to person, place, and time.  Skin: Skin is warm and dry.     CMP Latest Ref Rng & Units 02/06/2018  Glucose 65 - 99 mg/dL 102(H)  BUN 6 - 20 mg/dL 6  Creatinine 0.44 - 1.00 mg/dL 0.67  Sodium 135 - 145 mmol/L 135  Potassium 3.5 - 5.1 mmol/L 3.9  Chloride 101 - 111 mmol/L 100(L)  CO2 22 - 32 mmol/L 23  Calcium 8.9 - 10.3 mg/dL 8.4(L)  Total Protein 6.5 - 8.1 g/dL 6.6  Total Bilirubin 0.3 - 1.2 mg/dL 0.5  Alkaline Phos 38 - 126 U/L 68  AST 15 - 41 U/L 24  ALT 14 - 54 U/L 20   CBC Latest Ref Rng & Units 02/06/2018  WBC 3.6 - 11.0 K/uL 5.7  Hemoglobin 12.0 - 16.0 g/dL 12.4  Hematocrit 35.0 - 47.0 % 36.8  Platelets 150 - 440 K/uL 159     Assessment and plan- Patient is a 72 y.o. female at least stage IIIC pT4b pN2aMxstatus post hemicolectomyhere for on treatment assessment prior to  cycle2of FOLFOX chemotherapy  Counts are ok to proceed with cycle 2 of folfox chemotherapy today. She will get neulasta on day 3 when she comes for disconnect due to chemo induced neutropenia with cycle 1. I will see ehr back in 2 weeks for cycle 3 of folfox chemotherapy with cbc/ cmp  HR today in 50's. Will touch based with dr. Humphrey Rolls if she needs to adjust her metoprolol dose   Visit Diagnosis 1. Colon adenocarcinoma ([place])   2. Encounter for antineoplastic chemotherapy      Dr. Randa Evens, MD, MPH [facility]  at [facility] Pager- [phone] 02/06/2018 9:07 AM

## 2018-02-07 ENCOUNTER — Ambulatory Visit: Payer: Self-pay

## 2018-02-07 ENCOUNTER — Ambulatory Visit: Payer: Self-pay | Admitting: Oncology

## 2018-02-07 ENCOUNTER — Other Ambulatory Visit: Payer: Self-pay

## 2018-02-08 ENCOUNTER — Inpatient Hospital Stay: Payer: Medicare Other

## 2018-02-08 VITALS — BP 135/85 | HR 60 | Temp 96.5°F | Resp 18

## 2018-02-08 DIAGNOSIS — Z5111 Encounter for antineoplastic chemotherapy: Secondary | ICD-10-CM | POA: Diagnosis not present

## 2018-02-08 DIAGNOSIS — C189 Malignant neoplasm of colon, unspecified: Secondary | ICD-10-CM

## 2018-02-08 MED ORDER — HEPARIN SOD (PORK) LOCK FLUSH 100 UNIT/ML IV SOLN
500.0000 [IU] | Freq: Once | INTRAVENOUS | Status: AC | PRN
  Administered 2018-02-08: 500 [IU]

## 2018-02-08 MED ORDER — SODIUM CHLORIDE 0.9% FLUSH
10.0000 mL | INTRAVENOUS | Status: DC | PRN
  Administered 2018-02-08: 10 mL
  Filled 2018-02-08: qty 10

## 2018-02-08 MED ORDER — PEGFILGRASTIM INJECTION 6 MG/0.6ML ~~LOC~~
6.0000 mg | PREFILLED_SYRINGE | Freq: Once | SUBCUTANEOUS | Status: AC
  Administered 2018-02-08: 6 mg via SUBCUTANEOUS

## 2018-02-08 MED ORDER — HEPARIN SOD (PORK) LOCK FLUSH 100 UNIT/ML IV SOLN
INTRAVENOUS | Status: AC
  Filled 2018-02-08: qty 5

## 2018-02-20 ENCOUNTER — Inpatient Hospital Stay: Payer: Medicare Other

## 2018-02-20 ENCOUNTER — Encounter: Payer: Self-pay | Admitting: Oncology

## 2018-02-20 ENCOUNTER — Inpatient Hospital Stay: Payer: Medicare Other | Attending: Oncology | Admitting: Oncology

## 2018-02-20 VITALS — BP 135/49 | HR 69 | Temp 96.1°F | Ht 65.0 in | Wt 190.7 lb

## 2018-02-20 VITALS — BP 150/80 | HR 76 | Temp 96.3°F | Resp 19

## 2018-02-20 DIAGNOSIS — C189 Malignant neoplasm of colon, unspecified: Secondary | ICD-10-CM

## 2018-02-20 DIAGNOSIS — Z5111 Encounter for antineoplastic chemotherapy: Secondary | ICD-10-CM | POA: Insufficient documentation

## 2018-02-20 DIAGNOSIS — C182 Malignant neoplasm of ascending colon: Secondary | ICD-10-CM

## 2018-02-20 DIAGNOSIS — Z79899 Other long term (current) drug therapy: Secondary | ICD-10-CM | POA: Insufficient documentation

## 2018-02-20 DIAGNOSIS — D696 Thrombocytopenia, unspecified: Secondary | ICD-10-CM | POA: Insufficient documentation

## 2018-02-20 DIAGNOSIS — Z7689 Persons encountering health services in other specified circumstances: Secondary | ICD-10-CM | POA: Diagnosis not present

## 2018-02-20 LAB — CBC WITH DIFFERENTIAL/PLATELET
BASOS ABS: 0 10*3/uL (ref 0–0.1)
BASOS PCT: 0 %
Eosinophils Absolute: 0.1 10*3/uL (ref 0–0.7)
Eosinophils Relative: 1 %
HEMATOCRIT: 37.3 % (ref 35.0–47.0)
HEMOGLOBIN: 12.4 g/dL (ref 12.0–16.0)
Lymphocytes Relative: 20 %
Lymphs Abs: 2.2 10*3/uL (ref 1.0–3.6)
MCH: 28.6 pg (ref 26.0–34.0)
MCHC: 33.3 g/dL (ref 32.0–36.0)
MCV: 85.9 fL (ref 80.0–100.0)
Monocytes Absolute: 0.9 10*3/uL (ref 0.2–0.9)
Monocytes Relative: 8 %
NEUTROS ABS: 7.7 10*3/uL — AB (ref 1.4–6.5)
NEUTROS PCT: 71 %
Platelets: 169 10*3/uL (ref 150–440)
RBC: 4.34 MIL/uL (ref 3.80–5.20)
RDW: 19 % — AB (ref 11.5–14.5)
WBC: 10.9 10*3/uL (ref 3.6–11.0)

## 2018-02-20 LAB — COMPREHENSIVE METABOLIC PANEL
ALK PHOS: 84 U/L (ref 38–126)
ALT: 16 U/L (ref 14–54)
ANION GAP: 8 (ref 5–15)
AST: 26 U/L (ref 15–41)
Albumin: 3.8 g/dL (ref 3.5–5.0)
BILIRUBIN TOTAL: 0.6 mg/dL (ref 0.3–1.2)
BUN: 6 mg/dL (ref 6–20)
CALCIUM: 8.8 mg/dL — AB (ref 8.9–10.3)
CO2: 24 mmol/L (ref 22–32)
Chloride: 102 mmol/L (ref 101–111)
Creatinine, Ser: 0.7 mg/dL (ref 0.44–1.00)
Glucose, Bld: 107 mg/dL — ABNORMAL HIGH (ref 65–99)
Potassium: 3.8 mmol/L (ref 3.5–5.1)
SODIUM: 134 mmol/L — AB (ref 135–145)
TOTAL PROTEIN: 6.5 g/dL (ref 6.5–8.1)

## 2018-02-20 MED ORDER — OXALIPLATIN CHEMO INJECTION 100 MG/20ML
85.0000 mg/m2 | Freq: Once | INTRAVENOUS | Status: AC
  Administered 2018-02-20: 170 mg via INTRAVENOUS
  Filled 2018-02-20: qty 34

## 2018-02-20 MED ORDER — DEXAMETHASONE SODIUM PHOSPHATE 10 MG/ML IJ SOLN
10.0000 mg | Freq: Once | INTRAMUSCULAR | Status: AC
  Administered 2018-02-20: 10 mg via INTRAVENOUS
  Filled 2018-02-20: qty 1

## 2018-02-20 MED ORDER — SODIUM CHLORIDE 0.9 % IV SOLN: 10.0000 mg | Freq: Once | INTRAVENOUS | Status: DC

## 2018-02-20 MED ORDER — DEXTROSE 5 % IV SOLN
Freq: Once | INTRAVENOUS | Status: AC
  Administered 2018-02-20: 10:00:00 via INTRAVENOUS
  Filled 2018-02-20: qty 1000

## 2018-02-20 MED ORDER — SODIUM CHLORIDE 0.9 % IV SOLN
2400.0000 mg/m2 | INTRAVENOUS | Status: AC
  Administered 2018-02-20: 4800 mg via INTRAVENOUS
  Filled 2018-02-20 (×2): qty 96

## 2018-02-20 MED ORDER — PALONOSETRON HCL INJECTION 0.25 MG/5ML
0.2500 mg | Freq: Once | INTRAVENOUS | Status: AC
  Administered 2018-02-20: 0.25 mg via INTRAVENOUS
  Filled 2018-02-20: qty 5

## 2018-02-20 MED ORDER — LEUCOVORIN CALCIUM INJECTION 350 MG
800.0000 mg | Freq: Once | INTRAVENOUS | Status: AC
  Administered 2018-02-20: 800 mg via INTRAVENOUS
  Filled 2018-02-20: qty 25
  Filled 2018-02-20: qty 40

## 2018-02-20 MED ORDER — SODIUM CHLORIDE 0.9% FLUSH
10.0000 mL | INTRAVENOUS | Status: DC | PRN
  Administered 2018-02-20: 10 mL
  Filled 2018-02-20: qty 10

## 2018-02-20 MED ORDER — FLUOROURACIL CHEMO INJECTION 2.5 GM/50ML
400.0000 mg/m2 | Freq: Once | INTRAVENOUS | Status: AC
  Administered 2018-02-20: 800 mg via INTRAVENOUS
  Filled 2018-02-20 (×2): qty 16

## 2018-02-20 NOTE — Progress Notes (Signed)
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 She presented to the ER with symptoms of dizziness mostly when she stands up. She has had problems with constipation in the past and takes prune juice to relieve it. Patient is a poor historian at baseline and complained of abdominal pain in ER which led to CT abdomen. 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  CT chest without contrast showed tiny 2 mm right upper lobe pulmonary nodule. No other evidence of metastatic disease. Baseline CEA elevated at 4.8  Patient underwent exploratory laparotomy with right hemicolectomy and terminal ileum resection as well as separate small bowel resection on 12/06/2017. Operative findings showed a large cecal tumor with extramural spread to retroperitoneum mesentery, mesentery lymph nodes and parietal peritoneum. Dense inflammatory response of the retroperitoneum and small bowel.There was a second portion of small bowel that was involved and was resected. A small portion of free tumor was identified in the retroperitoneum which was sent off for examination as well.  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 Radial margin positive for invasive carcinoma 2 colon adenomas 2.5 and 1.8 cm of the ascending colon. Metastatic adenocarcinoma in 4 out  of 13 lymph nodes. 2 tumor deposits Segment of small intestine with abscess which also showed adenocarcinoma invading the distal appendix with perforation. One mesenteric lymph node negative for malignancy. Retroperitoneal tumor removal was also positive for mucinous adenocarcinoma. Tumor was grade 2. Proximal and distal margins were negative.pT4bpN2a. MSI stable  Case discussed at tumor board. PET CT will not be done at this time as it may represent post surgical changes. Radiation oncology does not recommend adjuvant RT upon completion of chemotherapy.Plan is to receive 12 cycles or 6 months of adjuvant FOLFOX chemotherapy    Interval history- she is tolerating chemo well so far. Denies any fatigue, nausea, vomiting. Bowel movements have been regular. Denies any abdominal pain. Denies any tingling numbness in hands and feet  ECOG PS- 1 Pain scale- 0  Review of systems- Review of Systems  Constitutional: Negative for chills, fever, malaise/fatigue and weight loss.  HENT: Negative for congestion, ear discharge and nosebleeds.   Eyes: Negative for blurred vision.  Respiratory: Negative for cough, hemoptysis, sputum production, shortness of breath and wheezing.   Cardiovascular: Negative for chest pain, palpitations, orthopnea and claudication.  Gastrointestinal: Negative for abdominal pain, blood in stool, constipation, diarrhea, heartburn, melena, nausea and vomiting.  Genitourinary: Negative for dysuria, flank pain, frequency, hematuria and urgency.  Musculoskeletal: Negative for back pain, joint pain and myalgias.  Skin: Negative for rash.  Neurological: Negative for dizziness, tingling, focal weakness, seizures, weakness and headaches.  Endo/Heme/Allergies: Does not bruise/bleed easily.  Psychiatric/Behavioral: Negative for depression and suicidal ideas. The patient does not have insomnia.      No Known Allergies   Past Medical History:  Diagnosis Date  . Cancer [facility])       Past Surgical History:  Procedure Laterality Date  . COLOSTOMY REVISION N/A 12/06/2017   Procedure: COLON RESECTION RIGHT;  Surgeon: Florene Glen, MD;  Location: [facility] ORS;  Service: General;  Laterality: N/A;  . DILATION AND CURETTAGE, DIAGNOSTIC / THERAPEUTIC    . PORTACATH PLACEMENT N/A 01/06/2018   Procedure: INSERTION PORT-A-CATH;  Surgeon: Vickie Epley, MD;  Location: [facility] ORS;  Service: General;  Laterality: N/A;    Social History   Socioeconomic History  . Marital status: Widowed    Spouse name: Not on file  . Number of children: Not on file  . Years of education: Not on file  . Highest education level: Not on file  Occupational History  . Not on file  Social Needs  . Financial resource strain: Patient refused  . Food insecurity:    Worry: Patient refused    Inability: Patient refused  . Transportation needs:    Medical: Patient refused    Non-medical: Patient refused  Tobacco Use  . Smoking status: Former Research scientist (life sciences)  . Smokeless tobacco: Never Used  Substance and Sexual Activity  . Alcohol use: No  . Drug use: No  . Sexual activity: Never  Lifestyle  . Physical activity:    Days per week: Patient refused    Minutes per session: Patient refused  . Stress: Patient refused  Relationships  . Social connections:    Talks on phone: Patient refused    Gets together: Patient refused    Attends religious service: Patient refused    Active member of club or organization: Patient refused    Attends meetings of clubs or organizations: Patient refused    Relationship status: Patient refused  . Intimate partner violence:    Fear of current or ex partner: Patient refused    Emotionally abused: Patient refused    Physically abused: Patient refused    Forced sexual activity: Patient refused  Other Topics Concern  . Not on file  Social History Narrative  . Not on file    Family History  Problem Relation Age of Onset  . ALS Mother   . ALS Father       Current Outpatient Medications:  .  acetaminophen (TYLENOL) 500 MG tablet, Take 1,000 mg by mouth every 8 (eight) hours as needed for mild pain., Disp: , Rfl:  .  alendronate (FOSAMAX) 70 MG tablet, Take 70 mg by mouth once a week., Disp: , Rfl:  .  ARIPiprazole (ABILIFY) 5 MG tablet, Take 5 mg by mouth at bedtime. , Disp: , Rfl:  .  benztropine (COGENTIN) 0.5 MG tablet, Take 0.5 mg by mouth at bedtime., Disp: , Rfl:  .  Calcium 600-200 MG-UNIT tablet, Take 1 tablet by mouth daily., Disp: , Rfl:  .  calcium carbonate (TUMS - DOSED IN MG ELEMENTAL CALCIUM) 500 MG chewable tablet, Chew 1 tablet by mouth 2 (two) times daily as needed for indigestion or heartburn., Disp: , Rfl:  .  dexamethasone (DECADRON) 4 MG tablet, Take 1 tablet (4 mg total) by mouth 2 (two) times daily with a meal. Starting the day after chemotherapy for 2 days, Disp: 30 tablet, Rfl: 0 .  divalproex (DEPAKOTE) 500 MG DR tablet, Take 500 mg by mouth at bedtime. , Disp: , Rfl:  .  lidocaine-prilocaine (EMLA) cream, Apply 1 application topically as needed. 1 hour prior to coming for each chemotherapy treatment. Place small amount over port site and place saran wrap over the cream to protect the clothing, Disp: 30 g, Rfl: 0 .  loratadine (CLARITIN) 10 MG tablet, Take 1 tablet (10 mg total) by mouth daily as needed for allergies. Take 1 tablet the day before pump removal, 1 tablet the day of pump removal and 1 tablet the day after pump removal. Take as directed with each cycle of  chemotherapy treatment, Disp: 30 tablet, Rfl: 1 .  memantine (NAMENDA XR) 14 MG CP24 24 hr capsule, Take 14 mg by mouth at bedtime. , Disp: , Rfl:  .  metoprolol succinate (TOPROL-XL) 50 MG 24 hr tablet, Take 50 mg by mouth every morning. Take with or immediately following a meal. , Disp: , Rfl:  .  Multiple Vitamin (MULTIVITAMIN WITH MINERALS) TABS tablet, Take 1 tablet by mouth daily. (Patient taking differently: Take 1 tablet by mouth daily. ), Disp: 30  tablet, Rfl: 0 .  ondansetron (ZOFRAN) 8 MG tablet, Take 1 tablet (8 mg total) by mouth 2 (two) times daily as needed for nausea or vomiting. Can start using on day 3 after chemo, Disp: 20 tablet, Rfl: 0 .  perphenazine (TRILAFON) 4 MG tablet, Take 4 mg by mouth 2 (two) times daily., Disp: , Rfl:  .  prochlorperazine (COMPAZINE) 10 MG tablet, Take 1 tablet (10 mg total) by mouth every 6 (six) hours as needed for nausea or vomiting., Disp: 30 tablet, Rfl: 0 No current facility-administered medications for this visit.   Facility-Administered Medications Ordered in Other Visits:  .  fluorouracil (ADRUCIL) 4,800 mg in sodium chloride 0.9 % 54 mL chemo infusion, 2,400 mg/m2 (Treatment Plan Recorded), Intravenous, 1 day or 1 dose, Sindy Guadeloupe, MD .  fluorouracil (ADRUCIL) chemo injection 800 mg, 400 mg/m2 (Treatment Plan Recorded), Intravenous, Once, Sindy Guadeloupe, MD .  heparin lock flush 100 unit/mL, 500 Units, Intravenous, Once, Sindy Guadeloupe, MD .  leucovorin 800 mg in dextrose 5 % 250 mL infusion, 800 mg, Intravenous, Once, Sindy Guadeloupe, MD .  oxaliplatin (ELOXATIN) 170 mg in dextrose 5 % 500 mL chemo infusion, 85 mg/m2 (Treatment Plan Recorded), Intravenous, Once, Sindy Guadeloupe, MD .  sodium chloride flush (NS) 0.9 % injection 10 mL, 10 mL, Intravenous, PRN, Sindy Guadeloupe, MD, 10 mL at 02/06/18 0845 .  sodium chloride flush (NS) 0.9 % injection 10 mL, 10 mL, Intracatheter, PRN, Sindy Guadeloupe, MD  Physical exam:  Vitals:   02/20/18 0857  BP: (!) 135/49  Pulse: 69  Temp: (!) 96.1 F (35.6 C)  TempSrc: Tympanic  SpO2: 98%  Weight: 190 lb 11.2 oz (86.5 kg)  Height: '5\' 5"'  (1.651 m)   Physical Exam  Constitutional: She is oriented to person, place, and time and well-developed, well-nourished, and in no distress.  HENT:  Head: Normocephalic and atraumatic.  Eyes: Pupils are equal, round, and reactive to light. EOM are normal.  Neck: Normal range of motion.  Cardiovascular: Normal  rate, regular rhythm and normal heart sounds.  Pulmonary/Chest: Effort normal and breath sounds normal.  Abdominal: Soft. Bowel sounds are normal.  Surgical scar well healed  Musculoskeletal: She exhibits edema (b/l ankle edema).  Neurological: She is alert and oriented to person, place, and time.  Skin: Skin is warm and dry.     CMP Latest Ref Rng & Units 02/20/2018  Glucose 65 - 99 mg/dL 107(H)  BUN 6 - 20 mg/dL 6  Creatinine 0.44 - 1.00 mg/dL 0.70  Sodium 135 - 145 mmol/L 134(L)  Potassium 3.5 - 5.1 mmol/L 3.8  Chloride 101 - 111 mmol/L 102  CO2 22 -  32 mmol/L 24  Calcium 8.9 - 10.3 mg/dL 8.8(L)  Total Protein 6.5 - 8.1 g/dL 6.5  Total Bilirubin 0.3 - 1.2 mg/dL 0.6  Alkaline Phos 38 - 126 U/L 84  AST 15 - 41 U/L 26  ALT 14 - 54 U/L 16   CBC Latest Ref Rng & Units 02/20/2018  WBC 3.6 - 11.0 K/uL 10.9  Hemoglobin 12.0 - 16.0 g/dL 12.4  Hematocrit 35.0 - 47.0 % 37.3  Platelets 150 - 440 K/uL 169     Assessment and plan- Patient is a 72 y.o. female at least stage IIIC pT4b pN2aMxstatus post hemicolectomyhere for on treatment assessment prior to cycle3of FOLFOX chemotherapy  Tolerating chemo well so far. No neuropathy. Counts are ok to proceed with cycle 3 of FOLFOX chemotherapy today. She will receive neulasta on day 3 due to chemo induced neutropenia with cycle 1. I will plan to repeat scans after cycle 6  rtc in 2 weeks- cbc/ cmp/ CEA for cycle 4 of FOLFOX and see me     Visit Diagnosis 1. Colon adenocarcinoma ([place])   2. Encounter for antineoplastic chemotherapy      Dr. Randa Evens, MD, MPH [facility] Pager- [phone] 02/20/2018 9:35 AM

## 2018-02-20 NOTE — Progress Notes (Signed)
No new changes noted today 

## 2018-02-21 ENCOUNTER — Ambulatory Visit: Payer: Self-pay | Admitting: Oncology

## 2018-02-21 ENCOUNTER — Ambulatory Visit: Payer: Self-pay

## 2018-02-21 ENCOUNTER — Other Ambulatory Visit: Payer: Self-pay

## 2018-02-22 ENCOUNTER — Inpatient Hospital Stay: Payer: Medicare Other

## 2018-02-22 DIAGNOSIS — Z5111 Encounter for antineoplastic chemotherapy: Secondary | ICD-10-CM | POA: Diagnosis not present

## 2018-02-22 DIAGNOSIS — C189 Malignant neoplasm of colon, unspecified: Secondary | ICD-10-CM

## 2018-02-22 MED ORDER — SODIUM CHLORIDE 0.9% FLUSH
10.0000 mL | INTRAVENOUS | Status: DC | PRN
  Administered 2018-02-22: 10 mL
  Filled 2018-02-22: qty 10

## 2018-02-22 MED ORDER — HEPARIN SOD (PORK) LOCK FLUSH 100 UNIT/ML IV SOLN
500.0000 [IU] | Freq: Once | INTRAVENOUS | Status: AC | PRN
  Administered 2018-02-22: 500 [IU]

## 2018-02-22 MED ORDER — HEPARIN SOD (PORK) LOCK FLUSH 100 UNIT/ML IV SOLN
INTRAVENOUS | Status: AC
  Filled 2018-02-22: qty 5

## 2018-02-22 MED ORDER — PEGFILGRASTIM INJECTION 6 MG/0.6ML ~~LOC~~
6.0000 mg | PREFILLED_SYRINGE | Freq: Once | SUBCUTANEOUS | Status: AC
  Administered 2018-02-22: 6 mg via SUBCUTANEOUS
  Filled 2018-02-22: qty 0.6

## 2018-03-06 ENCOUNTER — Inpatient Hospital Stay: Payer: Medicare Other

## 2018-03-06 ENCOUNTER — Encounter: Payer: Self-pay | Admitting: Oncology

## 2018-03-06 ENCOUNTER — Inpatient Hospital Stay (HOSPITAL_BASED_OUTPATIENT_CLINIC_OR_DEPARTMENT_OTHER): Payer: Medicare Other | Admitting: Oncology

## 2018-03-06 VITALS — BP 167/78 | HR 68 | Temp 97.7°F | Resp 18

## 2018-03-06 VITALS — BP 147/48 | HR 63 | Temp 97.4°F | Resp 18 | Ht 65.0 in | Wt 189.7 lb

## 2018-03-06 DIAGNOSIS — C182 Malignant neoplasm of ascending colon: Secondary | ICD-10-CM | POA: Diagnosis not present

## 2018-03-06 DIAGNOSIS — Z5111 Encounter for antineoplastic chemotherapy: Secondary | ICD-10-CM | POA: Diagnosis not present

## 2018-03-06 DIAGNOSIS — C189 Malignant neoplasm of colon, unspecified: Secondary | ICD-10-CM

## 2018-03-06 DIAGNOSIS — Z79899 Other long term (current) drug therapy: Secondary | ICD-10-CM | POA: Diagnosis not present

## 2018-03-06 DIAGNOSIS — D696 Thrombocytopenia, unspecified: Secondary | ICD-10-CM | POA: Diagnosis not present

## 2018-03-06 LAB — COMPREHENSIVE METABOLIC PANEL
ALBUMIN: 3.6 g/dL (ref 3.5–5.0)
ALT: 19 U/L (ref 14–54)
AST: 30 U/L (ref 15–41)
Alkaline Phosphatase: 84 U/L (ref 38–126)
Anion gap: 9 (ref 5–15)
CO2: 23 mmol/L (ref 22–32)
Calcium: 8.8 mg/dL — ABNORMAL LOW (ref 8.9–10.3)
Chloride: 100 mmol/L — ABNORMAL LOW (ref 101–111)
Creatinine, Ser: 0.6 mg/dL (ref 0.44–1.00)
GFR calc Af Amer: >60 mL/min (ref >60)
GLUCOSE: 103 mg/dL — AB (ref 65–99)
POTASSIUM: 3.6 mmol/L (ref 3.5–5.1)
SODIUM: 132 mmol/L — AB (ref 135–145)
Total Bilirubin: 0.6 mg/dL (ref 0.3–1.2)
Total Protein: 6.5 g/dL (ref 6.5–8.1)

## 2018-03-06 LAB — CBC WITH DIFFERENTIAL/PLATELET
Basophils Absolute: 0 10*3/uL (ref 0–0.1)
Basophils Relative: 0 %
EOS PCT: 0 %
Eosinophils Absolute: 0 10*3/uL (ref 0–0.7)
HCT: 35.2 % (ref 35.0–47.0)
Hemoglobin: 11.7 g/dL — ABNORMAL LOW (ref 12.0–16.0)
LYMPHS ABS: 2 10*3/uL (ref 1.0–3.6)
Lymphocytes Relative: 26 %
MCH: 28.5 pg (ref 26.0–34.0)
MCHC: 33.3 g/dL (ref 32.0–36.0)
MCV: 85.6 fL (ref 80.0–100.0)
Monocytes Absolute: 0.8 10*3/uL (ref 0.2–0.9)
Monocytes Relative: 11 %
Neutro Abs: 4.8 10*3/uL (ref 1.4–6.5)
Neutrophils Relative %: 63 %
PLATELETS: 91 10*3/uL — AB (ref 150–440)
RBC: 4.11 MIL/uL (ref 3.80–5.20)
RDW: 20.4 % — ABNORMAL HIGH (ref 11.5–14.5)
WBC: 7.7 10*3/uL (ref 3.6–11.0)

## 2018-03-06 MED ORDER — LEUCOVORIN CALCIUM INJECTION 350 MG
800.0000 mg | Freq: Once | INTRAMUSCULAR | Status: AC
  Administered 2018-03-06: 800 mg via INTRAVENOUS
  Filled 2018-03-06 (×3): qty 40

## 2018-03-06 MED ORDER — SODIUM CHLORIDE 0.9 % IV SOLN
2400.0000 mg/m2 | INTRAVENOUS | Status: AC
  Administered 2018-03-06: 4800 mg via INTRAVENOUS
  Filled 2018-03-06 (×4): qty 96

## 2018-03-06 MED ORDER — HEPARIN SOD (PORK) LOCK FLUSH 100 UNIT/ML IV SOLN: 500.0000 [IU] | Freq: Once | INTRAVENOUS | Status: DC | PRN

## 2018-03-06 MED ORDER — DEXTROSE 5 % IV SOLN
Freq: Once | INTRAVENOUS | Status: AC
  Administered 2018-03-06: 10:00:00 via INTRAVENOUS
  Filled 2018-03-06: qty 1000

## 2018-03-06 MED ORDER — SODIUM CHLORIDE 0.9% FLUSH
10.0000 mL | INTRAVENOUS | Status: DC | PRN
  Administered 2018-03-06: 10 mL
  Filled 2018-03-06: qty 10

## 2018-03-06 MED ORDER — DEXAMETHASONE SODIUM PHOSPHATE 10 MG/ML IJ SOLN
10.0000 mg | Freq: Once | INTRAMUSCULAR | Status: AC
  Administered 2018-03-06: 10 mg via INTRAVENOUS

## 2018-03-06 MED ORDER — FLUOROURACIL CHEMO INJECTION 2.5 GM/50ML
400.0000 mg/m2 | Freq: Once | INTRAVENOUS | Status: AC
  Administered 2018-03-06: 800 mg via INTRAVENOUS
  Filled 2018-03-06 (×2): qty 16

## 2018-03-06 MED ORDER — PALONOSETRON HCL INJECTION 0.25 MG/5ML
0.2500 mg | Freq: Once | INTRAVENOUS | Status: AC
  Administered 2018-03-06: 0.25 mg via INTRAVENOUS

## 2018-03-06 MED ORDER — OXALIPLATIN CHEMO INJECTION 100 MG/20ML
65.0000 mg/m2 | Freq: Once | INTRAVENOUS | Status: AC
  Administered 2018-03-06: 130 mg via INTRAVENOUS
  Filled 2018-03-06 (×4): qty 26

## 2018-03-06 NOTE — Progress Notes (Signed)
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 She presented to the ER with symptoms of dizziness mostly when she stands up. She has had problems with constipation in the past and takes prune juice to relieve it. Patient is a poor historian at baseline and complained of abdominal pain in ER which led to CT abdomen. 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  CT chest without contrast showed tiny 2 mm right upper lobe pulmonary nodule. No other evidence of metastatic disease. Baseline CEA elevated at 4.8  Patient underwent exploratory laparotomy with right hemicolectomy and terminal ileum resection as well as separate small bowel resection on 12/06/2017. Operative findings showed a large cecal tumor with extramural spread to retroperitoneum mesentery, mesentery lymph nodes and parietal peritoneum. Dense inflammatory response of the retroperitoneum and small bowel.There was a second portion of small bowel that was involved and was resected. A small portion of free tumor was identified in the retroperitoneum which was sent off for examination as well.  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 Radial margin positive for invasive carcinoma 2 colon adenomas 2.5 and 1.8 cm of the ascending colon. Metastatic adenocarcinoma in 4 out  of 13 lymph nodes. 2 tumor deposits Segment of small intestine with abscess which also showed adenocarcinoma invading the distal appendix with perforation. One mesenteric lymph node negative for malignancy. Retroperitoneal tumor removal was also positive for mucinous adenocarcinoma. Tumor was grade 2. Proximal and distal margins were negative.pT4bpN2a. MSI stable  Case discussed at tumor board. PET CT will not be done at this time as it may represent post surgical changes. Radiation oncology does not recommend adjuvant RT upon completion of chemotherapy.Plan is to receive 12 cycles or 6 months of adjuvant FOLFOX chemotherapy   Interval history- she is toleratign ehr chemotherapy well so far. Denies any fatigue, nausea, vomiting. Denies any tingling numbness in her extremities. She even had ice cream twice in the alst 2 weeks without any symptoms of cold sensitivity  ECOG PS- 1 Pain scale- 0   Review of systems- Review of Systems  Constitutional: Negative for chills, fever, malaise/fatigue and weight loss.  HENT: Negative for congestion, ear discharge and nosebleeds.   Eyes: Negative for blurred vision.  Respiratory: Negative for cough, hemoptysis, sputum production, shortness of breath and wheezing.   Cardiovascular: Negative for chest pain, palpitations, orthopnea and claudication.  Gastrointestinal: Negative for abdominal pain, blood in stool, constipation, diarrhea, heartburn, melena, nausea and vomiting.  Genitourinary: Negative for dysuria, flank pain, frequency, hematuria and urgency.  Musculoskeletal: Negative for back pain, joint pain and myalgias.  Skin: Negative for rash.  Neurological: Negative for dizziness, tingling, focal weakness, seizures, weakness and headaches.  Endo/Heme/Allergies: Does not bruise/bleed easily.  Psychiatric/Behavioral: Negative for depression and suicidal ideas. The patient does not have insomnia.      No Known Allergies   Past Medical  History:  Diagnosis Date  . Cancer ([place])  Past Surgical History:  Procedure Laterality Date  . COLOSTOMY REVISION N/A 12/06/2017   Procedure: COLON RESECTION RIGHT;  Surgeon: Florene Glen, MD;  Location: [facility] ORS;  Service: General;  Laterality: N/A;  . DILATION AND CURETTAGE, DIAGNOSTIC / THERAPEUTIC    . PORTACATH PLACEMENT N/A 01/06/2018   Procedure: INSERTION PORT-A-CATH;  Surgeon: Vickie Epley, MD;  Location: [facility] ORS;  Service: General;  Laterality: N/A;    Social History   Socioeconomic History  . Marital status: Widowed    Spouse name: Not on file  . Number of children: Not on file  . Years of education: Not on file  . Highest education level: Not on file  Occupational History  . Not on file  Social Needs  . Financial resource strain: Patient refused  . Food insecurity:    Worry: Patient refused    Inability: Patient refused  . Transportation needs:    Medical: Patient refused    Non-medical: Patient refused  Tobacco Use  . Smoking status: Former Research scientist (life sciences)  . Smokeless tobacco: Never Used  Substance and Sexual Activity  . Alcohol use: No  . Drug use: No  . Sexual activity: Never  Lifestyle  . Physical activity:    Days per week: Patient refused    Minutes per session: Patient refused  . Stress: Patient refused  Relationships  . Social connections:    Talks on phone: Patient refused    Gets together: Patient refused    Attends religious service: Patient refused    Active member of club or organization: Patient refused    Attends meetings of clubs or organizations: Patient refused    Relationship status: Patient refused  . Intimate partner violence:    Fear of current or ex partner: Patient refused    Emotionally abused: Patient refused    Physically abused: Patient refused    Forced sexual activity: Patient refused  Other Topics Concern  . Not on file  Social History Narrative  . Not on file    Family History  Problem Relation Age of  Onset  . ALS Mother   . ALS Father      Current Outpatient Medications:  .  acetaminophen (TYLENOL) 500 MG tablet, Take 1,000 mg by mouth every 8 (eight) hours as needed for mild pain., Disp: , Rfl:  .  alendronate (FOSAMAX) 70 MG tablet, Take 70 mg by mouth once a week., Disp: , Rfl:  .  ARIPiprazole (ABILIFY) 5 MG tablet, Take 5 mg by mouth at bedtime. , Disp: , Rfl:  .  benztropine (COGENTIN) 0.5 MG tablet, Take 0.5 mg by mouth at bedtime., Disp: , Rfl:  .  Calcium 600-200 MG-UNIT tablet, Take 1 tablet by mouth daily., Disp: , Rfl:  .  calcium carbonate (TUMS - DOSED IN MG ELEMENTAL CALCIUM) 500 MG chewable tablet, Chew 1 tablet by mouth 2 (two) times daily as needed for indigestion or heartburn., Disp: , Rfl:  .  divalproex (DEPAKOTE) 500 MG DR tablet, Take 500 mg by mouth at bedtime. , Disp: , Rfl:  .  lidocaine-prilocaine (EMLA) cream, Apply 1 application topically as needed. 1 hour prior to coming for each chemotherapy treatment. Place small amount over port site and place saran wrap over the cream to protect the clothing, Disp: 30 g, Rfl: 0 .  loratadine (CLARITIN) 10 MG tablet, Take 1 tablet (10 mg total) by mouth daily as needed for allergies. Take 1 tablet the day before pump removal, 1 tablet the day of  pump removal and 1 tablet the day after pump removal. Take as directed with each cycle of  chemotherapy treatment, Disp: 30 tablet, Rfl: 1 .  memantine (NAMENDA XR) 14 MG CP24 24 hr capsule, Take 14 mg by mouth at bedtime. , Disp: , Rfl:  .  metoprolol succinate (TOPROL-XL) 50 MG 24 hr tablet, Take 50 mg by mouth every morning. Take with or immediately following a meal. , Disp: , Rfl:  .  Multiple Vitamin (MULTIVITAMIN WITH MINERALS) TABS tablet, Take 1 tablet by mouth daily. (Patient taking differently: Take 1 tablet by mouth daily. ), Disp: 30 tablet, Rfl: 0 .  ondansetron (ZOFRAN) 8 MG tablet, Take 1 tablet (8 mg total) by mouth 2 (two) times daily as needed for nausea or vomiting.  Can start using on day 3 after chemo, Disp: 20 tablet, Rfl: 0 .  perphenazine (TRILAFON) 4 MG tablet, Take 4 mg by mouth 2 (two) times daily., Disp: , Rfl:  .  prochlorperazine (COMPAZINE) 10 MG tablet, Take 1 tablet (10 mg total) by mouth every 6 (six) hours as needed for nausea or vomiting., Disp: 30 tablet, Rfl: 0 .  dexamethasone (DECADRON) 4 MG tablet, Take 1 tablet (4 mg total) by mouth 2 (two) times daily with a meal. Starting the day after chemotherapy for 2 days, Disp: 30 tablet, Rfl: 0 No current facility-administered medications for this visit.   Facility-Administered Medications Ordered in Other Visits:  .  fluorouracil (ADRUCIL) 4,800 mg in sodium chloride 0.9 % 54 mL chemo infusion, 2,400 mg/m2 (Treatment Plan Recorded), Intravenous, 1 day or 1 dose, Sindy Guadeloupe, MD .  fluorouracil (ADRUCIL) chemo injection 800 mg, 400 mg/m2 (Treatment Plan Recorded), Intravenous, Once, Sindy Guadeloupe, MD .  heparin lock flush 100 unit/mL, 500 Units, Intravenous, Once, Sindy Guadeloupe, MD .  heparin lock flush 100 unit/mL, 500 Units, Intracatheter, Once PRN, Sindy Guadeloupe, MD .  leucovorin 800 mg in dextrose 5 % 250 mL infusion, 800 mg, Intravenous, Once, Sindy Guadeloupe, MD, Last Rate: 145 mL/hr at 03/06/18 1043, 800 mg at 03/06/18 1043 .  oxaliplatin (ELOXATIN) 130 mg in dextrose 5 % 500 mL chemo infusion, 65 mg/m2 (Treatment Plan Recorded), Intravenous, Once, Sindy Guadeloupe, MD, Last Rate: 263 mL/hr at 03/06/18 1038, 130 mg at 03/06/18 1038 .  sodium chloride flush (NS) 0.9 % injection 10 mL, 10 mL, Intravenous, PRN, Sindy Guadeloupe, MD, 10 mL at 02/06/18 0845 .  sodium chloride flush (NS) 0.9 % injection 10 mL, 10 mL, Intracatheter, PRN, Sindy Guadeloupe, MD .  sodium chloride flush (NS) 0.9 % injection 10 mL, 10 mL, Intracatheter, PRN, Sindy Guadeloupe, MD, 10 mL at 03/06/18 0900  Physical exam:  Vitals:   03/06/18 0900 03/06/18 0920  BP: (!) (P) 157/77 (!) 147/48  Pulse: (P) 64 63  Resp: (P) 18  18  Temp: (!) (P) 94 F (34.4 C) (!) 97.4 F (36.3 C)  TempSrc: (P) Tympanic Tympanic  SpO2:  99%  Weight:  189 lb 11.3 oz (86 kg)  Height:  '5\' 5"'  (1.651 m)   Physical Exam  Constitutional: She is oriented to person, place, and time.  HENT:  Head: Normocephalic and atraumatic.  Eyes: Pupils are equal, round, and reactive to light. EOM are normal.  Neck: Normal range of motion.  Cardiovascular: Normal rate, regular rhythm and normal heart sounds.  Pulmonary/Chest: Effort normal and breath sounds normal.  Abdominal: Soft. Bowel sounds are normal.  Musculoskeletal: She exhibits edema (  trace b/l ankle).  Neurological: She is alert and oriented to person, place, and time.  Skin: Skin is warm and dry.     CMP Latest Ref Rng & Units 03/06/2018  Glucose 65 - 99 mg/dL 103(H)  BUN 6 - 20 mg/dL <5(L)  Creatinine 0.44 - 1.00 mg/dL 0.60  Sodium 135 - 145 mmol/L 132(L)  Potassium 3.5 - 5.1 mmol/L 3.6  Chloride 101 - 111 mmol/L 100(L)  CO2 22 - 32 mmol/L 23  Calcium 8.9 - 10.3 mg/dL 8.8(L)  Total Protein 6.5 - 8.1 g/dL 6.5  Total Bilirubin 0.3 - 1.2 mg/dL 0.6  Alkaline Phos 38 - 126 U/L 84  AST 15 - 41 U/L 30  ALT 14 - 54 U/L 19   CBC Latest Ref Rng & Units 03/06/2018  WBC 3.6 - 11.0 K/uL 7.7  Hemoglobin 12.0 - 16.0 g/dL 11.7(L)  Hematocrit 35.0 - 47.0 % 35.2  Platelets 150 - 440 K/uL 91(L)      Assessment and plan- Patient is a 72 y.o. female female at least stage IIIC pT4b pN2aMxstatus post hemicolectomyhere for on treatment assessment prior to cycle4of adjuvant FOLFOX chemotherapy   patient has mild thrombocytopenia with platelet count of 91 today. Will therefore dose reduce oxaliplatin to 65 mg/meter square. She will get her cycle 4 of FOLFOX chemotherapy today. She gets neulasta on day 3 when she comes for pump disconnect. CEA pending from today  RTC in 2 weeks with cbc/ cmp prior to cycle 5 of FOLFOX chemotherapy   Visit Diagnosis 1. Colon adenocarcinoma ([place])   2.  Encounter for antineoplastic chemotherapy      Dr. Randa Evens, MD, MPH [facility] at [facility] [phone] 03/06/2018 11:40 AM

## 2018-03-06 NOTE — Progress Notes (Signed)
No new changes noted today 

## 2018-03-07 LAB — CEA: CEA: 4.2 ng/mL (ref 0.0–4.7)

## 2018-03-08 ENCOUNTER — Inpatient Hospital Stay: Payer: Medicare Other

## 2018-03-08 VITALS — BP 132/69 | HR 64 | Temp 97.8°F | Resp 18

## 2018-03-08 DIAGNOSIS — C189 Malignant neoplasm of colon, unspecified: Secondary | ICD-10-CM

## 2018-03-08 DIAGNOSIS — Z5111 Encounter for antineoplastic chemotherapy: Secondary | ICD-10-CM | POA: Diagnosis not present

## 2018-03-08 MED ORDER — HEPARIN SOD (PORK) LOCK FLUSH 100 UNIT/ML IV SOLN
500.0000 [IU] | Freq: Once | INTRAVENOUS | Status: AC | PRN
  Administered 2018-03-08: 500 [IU]

## 2018-03-08 MED ORDER — SODIUM CHLORIDE 0.9% FLUSH
10.0000 mL | INTRAVENOUS | Status: DC | PRN
  Administered 2018-03-08: 10 mL
  Filled 2018-03-08: qty 10

## 2018-03-08 MED ORDER — PEGFILGRASTIM INJECTION 6 MG/0.6ML ~~LOC~~
6.0000 mg | PREFILLED_SYRINGE | Freq: Once | SUBCUTANEOUS | Status: AC
  Administered 2018-03-08: 6 mg via SUBCUTANEOUS

## 2018-03-20 ENCOUNTER — Encounter: Payer: Self-pay | Admitting: Oncology

## 2018-03-20 ENCOUNTER — Inpatient Hospital Stay (HOSPITAL_BASED_OUTPATIENT_CLINIC_OR_DEPARTMENT_OTHER): Payer: Medicare Other | Admitting: Oncology

## 2018-03-20 ENCOUNTER — Inpatient Hospital Stay: Payer: Medicare Other

## 2018-03-20 VITALS — BP 152/82 | HR 64 | Temp 94.0°F | Resp 20 | Ht 65.0 in | Wt 188.9 lb

## 2018-03-20 DIAGNOSIS — C189 Malignant neoplasm of colon, unspecified: Secondary | ICD-10-CM

## 2018-03-20 DIAGNOSIS — Z79899 Other long term (current) drug therapy: Secondary | ICD-10-CM | POA: Diagnosis not present

## 2018-03-20 DIAGNOSIS — T451X5A Adverse effect of antineoplastic and immunosuppressive drugs, initial encounter: Secondary | ICD-10-CM

## 2018-03-20 DIAGNOSIS — Z5111 Encounter for antineoplastic chemotherapy: Secondary | ICD-10-CM | POA: Diagnosis not present

## 2018-03-20 DIAGNOSIS — C182 Malignant neoplasm of ascending colon: Secondary | ICD-10-CM

## 2018-03-20 DIAGNOSIS — D6959 Other secondary thrombocytopenia: Secondary | ICD-10-CM

## 2018-03-20 DIAGNOSIS — D696 Thrombocytopenia, unspecified: Secondary | ICD-10-CM

## 2018-03-20 LAB — COMPREHENSIVE METABOLIC PANEL
ALK PHOS: 88 U/L (ref 38–126)
ALT: 25 U/L (ref 14–54)
AST: 32 U/L (ref 15–41)
Albumin: 3.6 g/dL (ref 3.5–5.0)
Anion gap: 6 (ref 5–15)
BUN: 8 mg/dL (ref 6–20)
CHLORIDE: 104 mmol/L (ref 101–111)
CO2: 26 mmol/L (ref 22–32)
CREATININE: 0.63 mg/dL (ref 0.44–1.00)
Calcium: 9.1 mg/dL (ref 8.9–10.3)
GFR calc Af Amer: >60 mL/min (ref >60)
GFR calc non Af Amer: >60 mL/min (ref >60)
Glucose, Bld: 110 mg/dL — ABNORMAL HIGH (ref 65–99)
Potassium: 3.4 mmol/L — ABNORMAL LOW (ref 3.5–5.1)
SODIUM: 136 mmol/L (ref 135–145)
Total Bilirubin: 0.6 mg/dL (ref 0.3–1.2)
Total Protein: 6.1 g/dL — ABNORMAL LOW (ref 6.5–8.1)

## 2018-03-20 LAB — CBC WITH DIFFERENTIAL/PLATELET
Basophils Absolute: 0 10*3/uL (ref 0–0.1)
Basophils Relative: 0 %
EOS ABS: 0 10*3/uL (ref 0–0.7)
EOS PCT: 1 %
HCT: 34.6 % — ABNORMAL LOW (ref 35.0–47.0)
Hemoglobin: 11.5 g/dL — ABNORMAL LOW (ref 12.0–16.0)
LYMPHS ABS: 1.8 10*3/uL (ref 1.0–3.6)
Lymphocytes Relative: 23 %
MCH: 29.2 pg (ref 26.0–34.0)
MCHC: 33.3 g/dL (ref 32.0–36.0)
MCV: 87.8 fL (ref 80.0–100.0)
MONO ABS: 1.1 10*3/uL — AB (ref 0.2–0.9)
MONOS PCT: 15 %
Neutro Abs: 4.7 10*3/uL (ref 1.4–6.5)
Neutrophils Relative %: 61 %
PLATELETS: 58 10*3/uL — AB (ref 150–440)
RBC: 3.95 MIL/uL (ref 3.80–5.20)
RDW: 23.7 % — AB (ref 11.5–14.5)
WBC: 7.6 10*3/uL (ref 3.6–11.0)

## 2018-03-20 MED ORDER — SODIUM CHLORIDE 0.9 % IV SOLN: 2400.0000 mg/m2 | INTRAVENOUS | Status: DC

## 2018-03-20 MED ORDER — SODIUM CHLORIDE 0.9% FLUSH
10.0000 mL | INTRAVENOUS | Status: DC | PRN
  Administered 2018-03-20: 10 mL via INTRAVENOUS
  Filled 2018-03-20: qty 10

## 2018-03-20 MED ORDER — HEPARIN SOD (PORK) LOCK FLUSH 100 UNIT/ML IV SOLN
500.0000 [IU] | Freq: Once | INTRAVENOUS | Status: AC
  Administered 2018-03-20: 500 [IU] via INTRAVENOUS

## 2018-03-20 MED ORDER — HEPARIN SOD (PORK) LOCK FLUSH 100 UNIT/ML IV SOLN
INTRAVENOUS | Status: AC
  Filled 2018-03-20: qty 5

## 2018-03-20 MED ORDER — LEUCOVORIN CALCIUM INJECTION 350 MG: 800.0000 mg | Freq: Once | INTRAVENOUS | Status: DC

## 2018-03-20 MED ORDER — FLUOROURACIL CHEMO INJECTION 2.5 GM/50ML: 400.0000 mg/m2 | Freq: Once | INTRAVENOUS | Status: DC

## 2018-03-20 MED ORDER — OXALIPLATIN CHEMO INJECTION 100 MG/20ML: 65.0000 mg/m2 | Freq: Once | INTRAVENOUS | Status: DC

## 2018-03-20 NOTE — Addendum Note (Signed)
Addended by: Luella Cook on: 03/20/2018 12:20 PM   Modules accepted: Orders

## 2018-03-20 NOTE — Progress Notes (Signed)
   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 She presented to the ER with symptoms of dizziness mostly when she stands up. She has had problems with constipation in the past and takes prune juice to relieve it. Patient is a poor historian at baseline and complained of abdominal pain in ER which led to CT abdomen. 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  CT chest without contrast showed tiny 2 mm right upper lobe pulmonary nodule. No other evidence of metastatic disease. Baseline CEA elevated at 4.8  Patient underwent exploratory laparotomy with right hemicolectomy and terminal ileum resection as well as separate small bowel resection on 12/06/2017. Operative findings showed a large cecal tumor with extramural spread to retroperitoneum mesentery, mesentery lymph nodes and parietal peritoneum. Dense inflammatory response of the retroperitoneum and small bowel.There was a second portion of small bowel that was involved and was resected. A small portion of free tumor was identified in the retroperitoneum which was sent off for examination as well.  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 Radial margin positive for invasive carcinoma 2 colon adenomas 2.5 and 1.8 cm of the ascending colon. Metastatic adenocarcinoma in 4 out  of 13 lymph nodes. 2 tumor deposits Segment of small intestine with abscess which also showed adenocarcinoma invading the distal appendix with perforation. One mesenteric lymph node negative for malignancy. Retroperitoneal tumor removal was also positive for mucinous adenocarcinoma. Tumor was grade 2. Proximal and distal margins were negative.pT4bpN2a. MSI stable  Case discussed at tumor board. PET CT will not be done at this time as it may represent post surgical changes. Radiation oncology does not recommend adjuvant RT upon completion of chemotherapy.Plan is to receive 12 cycles or 6 months of adjuvant FOLFOX chemotherapy   Interval history- she feels well. Denies any nausea, vomiting, fatigue. Has mild cold sensitivity but denies other complaints  ECOG PS- 1 Pain scale- 0   Review of systems- Review of Systems  Constitutional: Negative for chills, fever, malaise/fatigue and weight loss.  HENT: Negative for congestion, ear discharge and nosebleeds.   Eyes: Negative for blurred vision.  Respiratory: Negative for cough, hemoptysis, sputum production, shortness of breath and wheezing.   Cardiovascular: Negative for chest pain, palpitations, orthopnea and claudication.  Gastrointestinal: Negative for abdominal pain, blood in stool, constipation, diarrhea, heartburn, melena, nausea and vomiting.  Genitourinary: Negative for dysuria, flank pain, frequency, hematuria and urgency.  Musculoskeletal: Negative for back pain, joint pain and myalgias.  Skin: Negative for rash.  Neurological: Negative for dizziness, tingling, focal weakness, seizures, weakness and headaches.  Endo/Heme/Allergies: Does not bruise/bleed easily.  Psychiatric/Behavioral: Negative for depression and suicidal ideas. The patient does not have insomnia.     No Known Allergies   Past Medical History:  Diagnosis Date  . Cancer ([facility])      Past Surgical History:  Procedure Laterality Date  . COLOSTOMY  REVISION N/A 12/06/2017   Procedure: COLON RESECTION   RIGHT;  Surgeon: Florene Glen, MD;  Location: [facility] ORS;  Service: General;  Laterality: N/A;  . DILATION AND CURETTAGE, DIAGNOSTIC / THERAPEUTIC    . PORTACATH PLACEMENT N/A 01/06/2018   Procedure: INSERTION PORT-A-CATH;  Surgeon: Vickie Epley, MD;  Location: [facility] ORS;  Service: General;  Laterality: N/A;    Social History   Socioeconomic History  . Marital status: Widowed    Spouse name: Not on file  . Number of children: Not on file  . Years of education: Not on file  . Highest education level: Not on file  Occupational History  . Not on file  Social Needs  . Financial resource strain: Patient refused  . Food insecurity:    Worry: Patient refused    Inability: Patient refused  . Transportation needs:    Medical: Patient refused    Non-medical: Patient refused  Tobacco Use  . Smoking status: Former Research scientist (life sciences)  . Smokeless tobacco: Never Used  Substance and Sexual Activity  . Alcohol use: No  . Drug use: No  . Sexual activity: Never  Lifestyle  . Physical activity:    Days per week: Patient refused    Minutes per session: Patient refused  . Stress: Patient refused  Relationships  . Social connections:    Talks on phone: Patient refused    Gets together: Patient refused    Attends religious service: Patient refused    Active member of club or organization: Patient refused    Attends meetings of clubs or organizations: Patient refused    Relationship status: Patient refused  . Intimate partner violence:    Fear of current or ex partner: Patient refused    Emotionally abused: Patient refused    Physically abused: Patient refused    Forced sexual activity: Patient refused  Other Topics Concern  . Not on file  Social History Narrative  . Not on file    Family History  Problem Relation Age of Onset  . ALS Mother   . ALS Father      Current Outpatient Medications:  .  acetaminophen (TYLENOL) 500 MG  tablet, Take 1,000 mg by mouth every 8 (eight) hours as needed for mild pain., Disp: , Rfl:  .  alendronate (FOSAMAX) 70 MG tablet, Take 70 mg by mouth once a week., Disp: , Rfl:  .  ARIPiprazole (ABILIFY) 5 MG tablet, Take 5 mg by mouth at bedtime. , Disp: , Rfl:  .  benztropine (COGENTIN) 0.5 MG tablet, Take 0.5 mg by mouth at bedtime., Disp: , Rfl:  .  Calcium 600-200 MG-UNIT tablet, Take 1 tablet by mouth daily., Disp: , Rfl:  .  calcium carbonate (TUMS - DOSED IN MG ELEMENTAL CALCIUM) 500 MG chewable tablet, Chew 1 tablet by mouth 2 (two) times daily as needed for indigestion or heartburn., Disp: , Rfl:  .  divalproex (DEPAKOTE) 500 MG DR tablet, Take 500 mg by mouth at bedtime. , Disp: , Rfl:  .  lidocaine-prilocaine (EMLA) cream, Apply 1 application topically as needed. 1 hour prior to coming for each chemotherapy treatment. Place small amount over port site and place saran wrap over the cream to protect the clothing, Disp: 30 g, Rfl: 0 .  loratadine (CLARITIN) 10 MG tablet, Take 1 tablet (10 mg total) by mouth daily as needed for allergies. Take 1 tablet the day before pump removal, 1 tablet the day of pump removal and 1 tablet the day after pump removal. Take as directed with each cycle of  chemotherapy treatment, Disp: 30 tablet, Rfl: 1 .  memantine (NAMENDA XR) 14 MG CP24 24 hr capsule, Take 14 mg by mouth at bedtime. , Disp: , Rfl:  .  metoprolol succinate (TOPROL-XL) 50 MG 24 hr tablet, Take 50 mg by mouth every morning. Take with or immediately following a meal. , Disp: , Rfl:  .  Multiple Vitamin (MULTIVITAMIN WITH MINERALS) TABS tablet, Take 1 tablet by mouth daily. (Patient taking differently: Take 1 tablet by mouth daily. ), Disp: 30 tablet, Rfl: 0 .  ondansetron (ZOFRAN) 8 MG tablet, Take 1 tablet (8 mg total) by mouth 2 (two) times daily as needed for nausea or vomiting. Can start using on day 3 after chemo, Disp: 20 tablet, Rfl: 0 .  perphenazine (TRILAFON) 4 MG tablet, Take 4 mg by  mouth 2 (two) times daily., Disp: , Rfl:  .  prochlorperazine (COMPAZINE) 10 MG tablet, Take 1 tablet (10 mg total) by mouth every 6 (six) hours as needed for nausea or vomiting., Disp: 30 tablet, Rfl: 0 .  dexamethasone (DECADRON) 4 MG tablet, Take 1 tablet (4 mg total) by mouth 2 (two) times daily with a meal. Starting the day after chemotherapy for 2 days, Disp: 30 tablet, Rfl: 0 No current facility-administered medications for this visit.   Facility-Administered Medications Ordered in Other Visits:  .  heparin lock flush 100 unit/mL, 500 Units, Intravenous, Once, ,  C, MD .  heparin lock flush 100 unit/mL, 500 Units, Intracatheter, Once PRN, ,  C, MD .  sodium chloride flush (NS) 0.9 % injection 10 mL, 10 mL, Intravenous, PRN, ,  C, MD, 10 mL at 02/06/18 0845 .  sodium chloride flush (NS) 0.9 % injection 10 mL, 10 mL, Intracatheter, PRN, ,  C, MD .  sodium chloride flush (NS) 0.9 % injection 10 mL, 10 mL, Intracatheter, PRN, ,  C, MD, 10 mL at 03/06/18 0900 .  sodium chloride flush (NS) 0.9 % injection 10 mL, 10 mL, Intravenous, PRN, ,  C, MD, 10 mL at 03/20/18 0912  Physical exam:  Vitals:   03/20/18 0902 03/20/18 0920  BP: (!) 152/82   Pulse: 64   Resp: 20   Temp: (!) 94 F (34.4 C)   TempSrc: Tympanic   Weight:  188 lb 14.4 oz (85.7 kg)  Height:  5' 5" (1.651 m)   Physical Exam  Constitutional: She is oriented to person, place, and time. She appears well-developed and well-nourished.  HENT:  Head: Normocephalic and atraumatic.  Eyes: Pupils are equal, round, and reactive to light. EOM are normal.  Neck: Normal range of motion.  Cardiovascular: Normal rate, regular rhythm and normal heart sounds.  Pulmonary/Chest: Effort normal and breath sounds normal.  Abdominal: Soft. Bowel sounds are normal.  Transverse surgical scar well healed  Musculoskeletal: She exhibits edema (b/l ankle edema).  Neurological: She is alert  and oriented to person, place, and time.  Skin: Skin is warm and dry.     CMP Latest Ref Rng & Units 03/20/2018  Glucose 65 - 99 mg/dL 110(H)  BUN 6 - 20 mg/dL 8  Creatinine 0.44 - 1.00 mg/dL 0.63  Sodium 135 - 145 mmol/L 136  Potassium 3.5 - 5.1 mmol/L 3.4(L)  Chloride 101 - 111 mmol/L 104  CO2 22 - 32 mmol/L 26  Calcium 8.9 - 10.3 mg/dL 9.1  Total Protein 6.5 - 8.1 g/dL 6.1(L)  Total Bilirubin 0.3 - 1.2 mg/dL 0.6  Alkaline Phos 38 - 126 U/L 88  AST   15 - 41 U/L 32  ALT 14 - 54 U/L 25   CBC Latest Ref Rng & Units 03/20/2018  WBC 3.6 - 11.0 K/uL 7.6  Hemoglobin 12.0 - 16.0 g/dL 11.5(L)  Hematocrit 35.0 - 47.0 % 34.6(L)  Platelets 150 - 440 K/uL 58(L)     Assessment and plan- Patient is a 72 y.o. female at least stage IIIC pT4b pN2aMxstatus post hemicolectomyhere for on treatment assessment prior to cycle5of adjuvant FOLFOX chemotherapy  Platelet count is 58 today. We will therefore hold chemotherapy today. RTC in 1 week with cbc, bmp. If platelet count >75, ok to proceed with chemotherapy. She will be seen in 3 weeks by covering practitioner for cycle 6 of FOLFOX chemotherapy. She does get neulasta on day 3 of chemotherapy. Repeat scans CT chest abdomen pelvis with contrast 4 weeks from now.  I will see her 5 weeks from now for cycle 7 of FOLFOX chemotherapy     Visit Diagnosis 1. Colon adenocarcinoma ([place])   2. Chemotherapy-induced thrombocytopenia      Dr. Randa Evens, MD, MPH [facility] at [facility] [phone] 03/20/2018 10:14 AM

## 2018-03-20 NOTE — Progress Notes (Signed)
Pt doing good, no c/o. Eating well and drinking good and bowels are good

## 2018-03-27 ENCOUNTER — Other Ambulatory Visit: Payer: Self-pay | Admitting: Oncology

## 2018-03-27 ENCOUNTER — Inpatient Hospital Stay: Payer: Medicare Other | Attending: Oncology

## 2018-03-27 ENCOUNTER — Inpatient Hospital Stay: Payer: Medicare Other

## 2018-03-27 ENCOUNTER — Ambulatory Visit: Payer: Medicare Other

## 2018-03-27 VITALS — BP 150/85 | HR 48 | Temp 94.5°F | Resp 20

## 2018-03-27 DIAGNOSIS — Z79899 Other long term (current) drug therapy: Secondary | ICD-10-CM | POA: Diagnosis not present

## 2018-03-27 DIAGNOSIS — C182 Malignant neoplasm of ascending colon: Secondary | ICD-10-CM | POA: Diagnosis not present

## 2018-03-27 DIAGNOSIS — Z87891 Personal history of nicotine dependence: Secondary | ICD-10-CM | POA: Insufficient documentation

## 2018-03-27 DIAGNOSIS — Z5111 Encounter for antineoplastic chemotherapy: Secondary | ICD-10-CM | POA: Insufficient documentation

## 2018-03-27 DIAGNOSIS — D696 Thrombocytopenia, unspecified: Secondary | ICD-10-CM | POA: Diagnosis not present

## 2018-03-27 DIAGNOSIS — Z7689 Persons encountering health services in other specified circumstances: Secondary | ICD-10-CM | POA: Diagnosis not present

## 2018-03-27 DIAGNOSIS — T451X5A Adverse effect of antineoplastic and immunosuppressive drugs, initial encounter: Secondary | ICD-10-CM

## 2018-03-27 DIAGNOSIS — C189 Malignant neoplasm of colon, unspecified: Secondary | ICD-10-CM

## 2018-03-27 DIAGNOSIS — D6959 Other secondary thrombocytopenia: Secondary | ICD-10-CM

## 2018-03-27 LAB — CBC WITH DIFFERENTIAL/PLATELET
BASOS ABS: 0.1 10*3/uL (ref 0–0.1)
Basophils Relative: 1 %
EOS ABS: 0 10*3/uL (ref 0–0.7)
Eosinophils Relative: 1 %
HCT: 34.7 % — ABNORMAL LOW (ref 35.0–47.0)
HEMOGLOBIN: 11.6 g/dL — AB (ref 12.0–16.0)
LYMPHS ABS: 1.4 10*3/uL (ref 1.0–3.6)
Lymphocytes Relative: 29 %
MCH: 29.9 pg (ref 26.0–34.0)
MCHC: 33.5 g/dL (ref 32.0–36.0)
MCV: 89 fL (ref 80.0–100.0)
Monocytes Absolute: 0.6 10*3/uL (ref 0.2–0.9)
Monocytes Relative: 13 %
Neutro Abs: 2.6 10*3/uL (ref 1.4–6.5)
Neutrophils Relative %: 56 %
Platelets: 124 10*3/uL — ABNORMAL LOW (ref 150–440)
RBC: 3.9 MIL/uL (ref 3.80–5.20)
RDW: 25.6 % — ABNORMAL HIGH (ref 11.5–14.5)
WBC: 4.6 10*3/uL (ref 3.6–11.0)

## 2018-03-27 LAB — BASIC METABOLIC PANEL
Anion gap: 8 (ref 5–15)
BUN: 7 mg/dL (ref 6–20)
CHLORIDE: 102 mmol/L (ref 101–111)
CO2: 23 mmol/L (ref 22–32)
CREATININE: 0.66 mg/dL (ref 0.44–1.00)
Calcium: 8.8 mg/dL — ABNORMAL LOW (ref 8.9–10.3)
GFR calc Af Amer: >60 mL/min (ref >60)
Glucose, Bld: 129 mg/dL — ABNORMAL HIGH (ref 65–99)
Potassium: 3.7 mmol/L (ref 3.5–5.1)
SODIUM: 133 mmol/L — AB (ref 135–145)

## 2018-03-27 MED ORDER — DEXAMETHASONE SODIUM PHOSPHATE 10 MG/ML IJ SOLN
10.0000 mg | Freq: Once | INTRAMUSCULAR | Status: AC
  Administered 2018-03-27: 10 mg via INTRAVENOUS
  Filled 2018-03-27: qty 1

## 2018-03-27 MED ORDER — LEUCOVORIN CALCIUM INJECTION 350 MG
800.0000 mg | Freq: Once | INTRAVENOUS | Status: AC
  Administered 2018-03-27: 800 mg via INTRAVENOUS
  Filled 2018-03-27 (×2): qty 40

## 2018-03-27 MED ORDER — PALONOSETRON HCL INJECTION 0.25 MG/5ML
0.2500 mg | Freq: Once | INTRAVENOUS | Status: AC
  Administered 2018-03-27: 0.25 mg via INTRAVENOUS
  Filled 2018-03-27: qty 5

## 2018-03-27 MED ORDER — SODIUM CHLORIDE 0.9% FLUSH
10.0000 mL | INTRAVENOUS | Status: AC | PRN
  Administered 2018-03-27: 10 mL via INTRAVENOUS
  Filled 2018-03-27: qty 10

## 2018-03-27 MED ORDER — HEPARIN SOD (PORK) LOCK FLUSH 100 UNIT/ML IV SOLN: 500.0000 [IU] | Freq: Once | INTRAVENOUS | Status: AC

## 2018-03-27 MED ORDER — DEXTROSE 5 % IV SOLN
65.0000 mg/m2 | Freq: Once | INTRAVENOUS | Status: AC
  Administered 2018-03-27: 130 mg via INTRAVENOUS
  Filled 2018-03-27 (×2): qty 26

## 2018-03-27 MED ORDER — SODIUM CHLORIDE 0.9 % IV SOLN: 10.0000 mg | Freq: Once | INTRAVENOUS | Status: DC

## 2018-03-27 MED ORDER — FLUOROURACIL CHEMO INJECTION 2.5 GM/50ML
400.0000 mg/m2 | Freq: Once | INTRAVENOUS | Status: AC
  Administered 2018-03-27: 800 mg via INTRAVENOUS
  Filled 2018-03-27 (×3): qty 16

## 2018-03-27 MED ORDER — SODIUM CHLORIDE 0.9 % IV SOLN
2400.0000 mg/m2 | INTRAVENOUS | Status: AC
  Administered 2018-03-27: 4800 mg via INTRAVENOUS
  Filled 2018-03-27 (×3): qty 96

## 2018-03-27 MED ORDER — DEXTROSE 5 % IV SOLN
Freq: Once | INTRAVENOUS | Status: AC
  Administered 2018-03-27: 10:00:00 via INTRAVENOUS
  Filled 2018-03-27: qty 1000

## 2018-03-27 NOTE — Patient Instructions (Signed)

## 2018-03-29 ENCOUNTER — Inpatient Hospital Stay: Payer: Medicare Other

## 2018-03-29 VITALS — BP 130/78 | HR 66 | Temp 97.3°F | Resp 20

## 2018-03-29 DIAGNOSIS — Z5111 Encounter for antineoplastic chemotherapy: Secondary | ICD-10-CM | POA: Diagnosis not present

## 2018-03-29 DIAGNOSIS — C189 Malignant neoplasm of colon, unspecified: Secondary | ICD-10-CM

## 2018-03-29 MED ORDER — SODIUM CHLORIDE 0.9% FLUSH
10.0000 mL | INTRAVENOUS | Status: DC | PRN
  Administered 2018-03-29: 10 mL
  Filled 2018-03-29: qty 10

## 2018-03-29 MED ORDER — HEPARIN SOD (PORK) LOCK FLUSH 100 UNIT/ML IV SOLN
500.0000 [IU] | Freq: Once | INTRAVENOUS | Status: AC | PRN
  Administered 2018-03-29: 500 [IU]

## 2018-03-29 MED ORDER — PEGFILGRASTIM INJECTION 6 MG/0.6ML ~~LOC~~
6.0000 mg | PREFILLED_SYRINGE | Freq: Once | SUBCUTANEOUS | Status: AC
  Administered 2018-03-29: 6 mg via SUBCUTANEOUS

## 2018-04-10 ENCOUNTER — Inpatient Hospital Stay (HOSPITAL_BASED_OUTPATIENT_CLINIC_OR_DEPARTMENT_OTHER): Payer: Medicare Other | Admitting: Oncology

## 2018-04-10 ENCOUNTER — Inpatient Hospital Stay: Payer: Medicare Other

## 2018-04-10 ENCOUNTER — Encounter: Payer: Self-pay | Admitting: Oncology

## 2018-04-10 VITALS — BP 109/78 | HR 57 | Temp 97.1°F | Resp 18 | Ht 65.0 in | Wt 187.8 lb

## 2018-04-10 DIAGNOSIS — D696 Thrombocytopenia, unspecified: Secondary | ICD-10-CM | POA: Diagnosis not present

## 2018-04-10 DIAGNOSIS — C189 Malignant neoplasm of colon, unspecified: Secondary | ICD-10-CM

## 2018-04-10 DIAGNOSIS — Z79899 Other long term (current) drug therapy: Secondary | ICD-10-CM | POA: Diagnosis not present

## 2018-04-10 DIAGNOSIS — Z7689 Persons encountering health services in other specified circumstances: Secondary | ICD-10-CM | POA: Diagnosis not present

## 2018-04-10 DIAGNOSIS — C182 Malignant neoplasm of ascending colon: Secondary | ICD-10-CM | POA: Diagnosis not present

## 2018-04-10 DIAGNOSIS — Z95828 Presence of other vascular implants and grafts: Secondary | ICD-10-CM

## 2018-04-10 DIAGNOSIS — Z87891 Personal history of nicotine dependence: Secondary | ICD-10-CM | POA: Diagnosis not present

## 2018-04-10 DIAGNOSIS — Z5111 Encounter for antineoplastic chemotherapy: Secondary | ICD-10-CM | POA: Diagnosis not present

## 2018-04-10 DIAGNOSIS — R600 Localized edema: Secondary | ICD-10-CM

## 2018-04-10 LAB — COMPREHENSIVE METABOLIC PANEL
ALT: 16 U/L (ref 14–54)
AST: 30 U/L (ref 15–41)
Albumin: 3.6 g/dL (ref 3.5–5.0)
Alkaline Phosphatase: 77 U/L (ref 38–126)
Anion gap: 8 (ref 5–15)
BUN: 8 mg/dL (ref 6–20)
CHLORIDE: 107 mmol/L (ref 101–111)
CO2: 23 mmol/L (ref 22–32)
Calcium: 9.1 mg/dL (ref 8.9–10.3)
Creatinine, Ser: 0.71 mg/dL (ref 0.44–1.00)
GFR calc Af Amer: >60 mL/min (ref >60)
GFR calc non Af Amer: >60 mL/min (ref >60)
Glucose, Bld: 100 mg/dL — ABNORMAL HIGH (ref 65–99)
POTASSIUM: 3.9 mmol/L (ref 3.5–5.1)
SODIUM: 138 mmol/L (ref 135–145)
Total Bilirubin: 0.7 mg/dL (ref 0.3–1.2)
Total Protein: 6.5 g/dL (ref 6.5–8.1)

## 2018-04-10 LAB — CBC WITH DIFFERENTIAL/PLATELET
Basophils Absolute: 0 10*3/uL (ref 0–0.1)
Basophils Relative: 1 %
Eosinophils Absolute: 0 10*3/uL (ref 0–0.7)
Eosinophils Relative: 1 %
HCT: 33.9 % — ABNORMAL LOW (ref 35.0–47.0)
HEMOGLOBIN: 11.4 g/dL — AB (ref 12.0–16.0)
LYMPHS ABS: 1.2 10*3/uL (ref 1.0–3.6)
LYMPHS PCT: 40 %
MCH: 30.8 pg (ref 26.0–34.0)
MCHC: 33.5 g/dL (ref 32.0–36.0)
MCV: 92 fL (ref 80.0–100.0)
Monocytes Absolute: 0.4 10*3/uL (ref 0.2–0.9)
Monocytes Relative: 14 %
Neutro Abs: 1.3 10*3/uL — ABNORMAL LOW (ref 1.4–6.5)
Neutrophils Relative %: 44 %
Platelets: 35 10*3/uL — ABNORMAL LOW (ref 150–440)
RBC: 3.69 MIL/uL — AB (ref 3.80–5.20)
RDW: 25.2 % — ABNORMAL HIGH (ref 11.5–14.5)
WBC: 2.9 10*3/uL — AB (ref 3.6–11.0)

## 2018-04-10 MED ORDER — SODIUM CHLORIDE 0.9% FLUSH
10.0000 mL | INTRAVENOUS | Status: DC | PRN
  Administered 2018-04-10: 10 mL via INTRAVENOUS
  Filled 2018-04-10: qty 10

## 2018-04-10 MED ORDER — HEPARIN SOD (PORK) LOCK FLUSH 100 UNIT/ML IV SOLN
500.0000 [IU] | Freq: Once | INTRAVENOUS | Status: AC
  Administered 2018-04-10: 500 [IU] via INTRAVENOUS

## 2018-04-10 NOTE — Progress Notes (Signed)
Scheduled for Folfox today. Unable to get chemo treatment today due to platelets being 35.

## 2018-04-10 NOTE — Progress Notes (Signed)
Pt has occ. Has cold sensitivity and some tingling with it.  She has occ. Diarrhea but not often. She strains sometimes and the person who helps her tells her to no straining.

## 2018-04-10 NOTE — Progress Notes (Signed)
Hematology/Oncology Consult note [facility]  Telephone:([phone] [phone]  Patient Care Team: Lin Givens, MD as PCP - General (Family Medicine) Clent Jacks, RN as Registered Nurse   Name of the patient: Latoya Jones  [id number]  09/11/1946   Date of visit: 04/10/18  Diagnosis-adenocarcinoma of the colon s/p hemicolectomy  Chief complaint/ Reason for visit- Treatment prior to cycle 6 of FOLFOX  Heme/Onc history: Patient is a 72 year old female with a h/o developmental and mood disoredr who resides at a group home. She presented to the ER with symptoms of dizziness mostly when she stands up. She has had problems with constipation in the past and takes prune juice to relieve it. Patient is a poor historian at baseline and complained of abdominal pain in ER which led to CT abdomen. 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  CT chest without contrast showed tiny 2 mm right upper lobe pulmonary nodule. No other evidence of metastatic disease. Baseline CEA elevated at 4.8  Patient underwent exploratory laparotomy with right hemicolectomy and terminal ileum resection as well as separate small bowel resection on 12/06/2017. Operative findings showed a large cecal tumor with extramural spread to retroperitoneum mesentery, mesentery lymph nodes and parietal peritoneum. Dense inflammatory response of the retroperitoneum and small bowel.There was a second portion of small bowel that was involved and was resected. A small portion of free tumor was identified in the retroperitoneum which was sent off for examination as well.  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 Radial margin positive for invasive carcinoma 2 colon adenomas 2.5 and 1.8 cm of the ascending colon. Metastatic adenocarcinoma in 4 out of 13 lymph nodes. 2 tumor deposits Segment of small intestine with  abscess which also showed adenocarcinoma invading the distal appendix with perforation. One mesenteric lymph node negative for malignancy. Retroperitoneal tumor removal was also positive for mucinous adenocarcinoma. Tumor was grade 2. Proximal and distal margins were negative.[id number]. MSI stable  Case discussed at tumor board. PET CT will not be done at this time as it may represent post surgical changes. Radiation oncology does not recommend adjuvant RT upon completion of chemotherapy.Plan is to receive 12 cycles or 6 months of adjuvant FOLFOX chemotherapy   Interval history-She was last evaluated by primary medical oncologist Dr. Janese Banks on 04/19/18 prior to cycle 5 of FOLFOX chemotherapy.  At that visit she was feeling well and only complained of mild cold sensitivity.  FOLFOX cycle 5 was held due to thrombocytopenia.  She was to return in 1 week for reconsideration of cycle 5 as long as her platelet count was greater than 75,000.  Cycle 5 of FOLFOX was given on 03/27/18 with a DC pump on 03/29/2017.  Platelet count had improved to 124,000.  Today she feels well and continues to complain of cold sensitivity in bilateral upper extremities.  Caregiver tells me she is eating and sleeping well.  Offers no new complaints.  ECOG PS- 1 Pain scale- 0   Review of systems- Review of Systems  Constitutional: Negative.  Negative for chills, fever, malaise/fatigue and weight loss.  HENT: Negative for congestion and ear pain.   Eyes: Negative.  Negative for blurred vision and double vision.  Respiratory: Negative.  Negative for cough, sputum production and shortness of breath.   Cardiovascular: Negative.  Negative for chest pain, palpitations and leg swelling.  Gastrointestinal: Negative.  Negative for abdominal pain, constipation, diarrhea, nausea and vomiting.  Genitourinary: Negative for dysuria, frequency and urgency.  Musculoskeletal: Negative for back pain and falls.  Skin: Negative.  Negative for  rash.  Neurological: Positive for sensory change (Cold sensitivity). Negative for weakness and headaches.  Endo/Heme/Allergies: Negative.  Does not bruise/bleed easily.  Psychiatric/Behavioral: Negative.  Negative for depression. The patient is not nervous/anxious and does not have insomnia.     No Known Allergies   Past Medical History:  Diagnosis Date  . Cancer [facility])      Past Surgical History:  Procedure Laterality Date  . COLOSTOMY REVISION N/A 12/06/2017   Procedure: COLON RESECTION RIGHT;  Surgeon: Florene Glen, MD;  Location: [facility] ORS;  Service: General;  Laterality: N/A;  . DILATION AND CURETTAGE, DIAGNOSTIC / THERAPEUTIC    . PORTACATH PLACEMENT N/A 01/06/2018   Procedure: INSERTION PORT-A-CATH;  Surgeon: Vickie Epley, MD;  Location: [facility] ORS;  Service: General;  Laterality: N/A;    Social History   Socioeconomic History  . Marital status: Widowed    Spouse name: Not on file  . Number of children: Not on file  . Years of education: Not on file  . Highest education level: Not on file  Occupational History  . Not on file  Social Needs  . Financial resource strain: Patient refused  . Food insecurity:    Worry: Patient refused    Inability: Patient refused  . Transportation needs:    Medical: Patient refused    Non-medical: Patient refused  Tobacco Use  . Smoking status: Former Research scientist (life sciences)  . Smokeless tobacco: Never Used  Substance and Sexual Activity  . Alcohol use: No  . Drug use: No  . Sexual activity: Never  Lifestyle  . Physical activity:    Days per week: Patient refused    Minutes per session: Patient refused  . Stress: Patient refused  Relationships  . Social connections:    Talks on phone: Patient refused    Gets together: Patient refused    Attends religious service: Patient refused    Active member of club or organization: Patient refused    Attends meetings of clubs or organizations: Patient refused    Relationship status: Patient  refused  . Intimate partner violence:    Fear of current or ex partner: Patient refused    Emotionally abused: Patient refused    Physically abused: Patient refused    Forced sexual activity: Patient refused  Other Topics Concern  . Not on file  Social History Narrative  . Not on file    Family History  Problem Relation Age of Onset  . ALS Mother   . ALS Father      Current Outpatient Medications:  .  acetaminophen (TYLENOL) 500 MG tablet, Take 1,000 mg by mouth every 8 (eight) hours as needed for mild pain., Disp: , Rfl:  .  alendronate (FOSAMAX) 70 MG tablet, Take 70 mg by mouth once a week., Disp: , Rfl:  .  ARIPiprazole (ABILIFY) 5 MG tablet, Take 5 mg by mouth at bedtime. , Disp: , Rfl:  .  benztropine (COGENTIN) 0.5 MG tablet, Take 0.5 mg by mouth at bedtime., Disp: , Rfl:  .  Calcium 600-200 MG-UNIT tablet, Take 1 tablet by mouth daily., Disp: , Rfl:  .  calcium carbonate (TUMS - DOSED IN MG ELEMENTAL CALCIUM) 500 MG chewable tablet, Chew 1 tablet by mouth 2 (two) times daily as needed for indigestion or heartburn., Disp: , Rfl:  .  dexamethasone (DECADRON) 4 MG tablet, Take 1 tablet (  4 mg total) by mouth 2 (two) times daily with a meal. Starting the day after chemotherapy for 2 days, Disp: 30 tablet, Rfl: 0 .  divalproex (DEPAKOTE) 500 MG DR tablet, Take 500 mg by mouth at bedtime. , Disp: , Rfl:  .  lidocaine-prilocaine (EMLA) cream, Apply 1 application topically as needed. 1 hour prior to coming for each chemotherapy treatment. Place small amount over port site and place saran wrap over the cream to protect the clothing, Disp: 30 g, Rfl: 0 .  loratadine (CLARITIN) 10 MG tablet, Take 1 tablet (10 mg total) by mouth daily as needed for allergies. Take 1 tablet the day before pump removal, 1 tablet the day of pump removal and 1 tablet the day after pump removal. Take as directed with each cycle of  chemotherapy treatment, Disp: 30 tablet, Rfl: 1 .  memantine (NAMENDA XR) 14 MG  CP24 24 hr capsule, Take 14 mg by mouth at bedtime. , Disp: , Rfl:  .  metoprolol succinate (TOPROL-XL) 50 MG 24 hr tablet, Take 50 mg by mouth every morning. Take with or immediately following a meal. , Disp: , Rfl:  .  Multiple Vitamin (MULTIVITAMIN WITH MINERALS) TABS tablet, Take 1 tablet by mouth daily. (Patient taking differently: Take 1 tablet by mouth daily. ), Disp: 30 tablet, Rfl: 0 .  ondansetron (ZOFRAN) 8 MG tablet, Take 1 tablet (8 mg total) by mouth 2 (two) times daily as needed for nausea or vomiting. Can start using on day 3 after chemo, Disp: 20 tablet, Rfl: 0 .  perphenazine (TRILAFON) 4 MG tablet, Take 4 mg by mouth 2 (two) times daily., Disp: , Rfl:  .  prochlorperazine (COMPAZINE) 10 MG tablet, Take 1 tablet (10 mg total) by mouth every 6 (six) hours as needed for nausea or vomiting., Disp: 30 tablet, Rfl: 0 No current facility-administered medications for this visit.   Facility-Administered Medications Ordered in Other Visits:  .  heparin lock flush 100 unit/mL, 500 Units, Intravenous, Once, Randa Evens C, MD .  heparin lock flush 100 unit/mL, 500 Units, Intracatheter, Once PRN, Sindy Guadeloupe, MD .  heparin lock flush 100 unit/mL, 500 Units, Intravenous, Once, Sindy Guadeloupe, MD .  sodium chloride flush (NS) 0.9 % injection 10 mL, 10 mL, Intravenous, PRN, Sindy Guadeloupe, MD, 10 mL at 02/06/18 0845 .  sodium chloride flush (NS) 0.9 % injection 10 mL, 10 mL, Intracatheter, PRN, Sindy Guadeloupe, MD .  sodium chloride flush (NS) 0.9 % injection 10 mL, 10 mL, Intracatheter, PRN, Sindy Guadeloupe, MD, 10 mL at 03/06/18 0900 .  sodium chloride flush (NS) 0.9 % injection 10 mL, 10 mL, Intravenous, PRN, Sindy Guadeloupe, MD, 10 mL at 03/27/18 0909 .  sodium chloride flush (NS) 0.9 % injection 10 mL, 10 mL, Intravenous, PRN, Jacquelin Hawking, NP, 10 mL at 04/10/18 0930  Physical exam:  Vitals:   04/10/18 0949  BP: 109/78  Pulse: (!) 57  Resp: 18  Temp: (!) 97.1 F (36.2 C)    TempSrc: Tympanic  Weight: 187 lb 12.8 oz (85.2 kg)  Height: 5' 5" (1.651 m)   Physical Exam  Constitutional: She is oriented to person, place, and time. Vital signs are normal.  HENT:  Head: Normocephalic and atraumatic.  Eyes: Pupils are equal, round, and reactive to light.  Neck: Normal range of motion.  Cardiovascular: Normal rate, regular rhythm and normal heart sounds.  No murmur heard. Pulmonary/Chest: Effort normal and breath sounds normal.  She has no wheezes.  Abdominal: Soft. Normal appearance and bowel sounds are normal. She exhibits no distension. There is no tenderness.  Musculoskeletal: Normal range of motion. She exhibits edema (+2 pitting edema in bilateral ankles).  Neurological: She is alert and oriented to person, place, and time.  Skin: Skin is warm and dry. No rash noted.  Psychiatric: Judgment normal.     CMP Latest Ref Rng & Units 04/10/2018  Glucose 65 - 99 mg/dL 100(H)  BUN 6 - 20 mg/dL 8  Creatinine 0.44 - 1.00 mg/dL 0.71  Sodium 135 - 145 mmol/L 138  Potassium 3.5 - 5.1 mmol/L 3.9  Chloride 101 - 111 mmol/L 107  CO2 22 - 32 mmol/L 23  Calcium 8.9 - 10.3 mg/dL 9.1  Total Protein 6.5 - 8.1 g/dL 6.5  Total Bilirubin 0.3 - 1.2 mg/dL 0.7  Alkaline Phos 38 - 126 U/L 77  AST 15 - 41 U/L 30  ALT 14 - 54 U/L 16   CBC Latest Ref Rng & Units 04/10/2018  WBC 3.6 - 11.0 K/uL 2.9(L)  Hemoglobin 12.0 - 16.0 g/dL 11.4(L)  Hematocrit 35.0 - 47.0 % 33.9(L)  Platelets 150 - 440 K/uL 35(L)     Assessment and plan- Patient is a 72 y.o. female at least stage IIIC pT4b pN2aMxstatus post hemicolectomyhere for on treatment assessment prior to cycle6of adjuvant FOLFOX chemotherapy.  1. Stage IIIc colon cancer: Platelet count is 35 today.  We will hold chemotherapy.  Patient asked to return to clinic in 1 week CBC, BMP but  unfortunately next Monday is a holiday.  Caregiver asking if she can return as scheduled in 2 weeks on April 24, 2018 for reconsideration of cycle  6 of FOLFOX chemotherapy.  Spoke at length with caregiver and patient regarding side effects of thrombocytopenia including risk for bleeding.  They understand if she develops any injury/fall or bleeding is unable to be stopped with pressure dressing to report to the emergency room immediately.   Has repeat CT chest/abdomen/pelvis with contrast at the end of the month.  Keep as scheduled.  We will just keep scheduled appointment with Dr. Janese Banks on 04/24/2018 for reconsideration of cycle 6 of adjuvant FOLFOX chemotherapy.  Patient and caregiver okay with this plan.  2. Bilateral lower extremity edema: Caregiver states this is no worse.  Has had a work-up for peripheral edema in the past which was negative.  Continue with TED hose and elevation as needed for edema.  Greater than 50% was spent in counseling and coordination of care with this patient including but not limited to discussion of the relevant topics above (See A&P) including, but not limited to diagnosis and management of acute and chronic medical conditions.   Visit Diagnosis 1. Malignant neoplasm of ascending colon ([place])   2. Thrombocytopenia ([place])   3. Bilateral edema of lower extremity     Faythe Casa, NP 04/10/2018 12:04 PM

## 2018-04-20 ENCOUNTER — Ambulatory Visit
Admission: RE | Admit: 2018-04-20 | Discharge: 2018-04-20 | Disposition: A | Discharge status: Home / Self Care | Payer: Medicare Other | Source: Ambulatory Visit | Attending: Oncology | Admitting: Oncology

## 2018-04-20 DIAGNOSIS — C189 Malignant neoplasm of colon, unspecified: Secondary | ICD-10-CM | POA: Insufficient documentation

## 2018-04-20 DIAGNOSIS — Z9049 Acquired absence of other specified parts of digestive tract: Secondary | ICD-10-CM | POA: Diagnosis not present

## 2018-04-20 DIAGNOSIS — R932 Abnormal findings on diagnostic imaging of liver and biliary tract: Secondary | ICD-10-CM | POA: Insufficient documentation

## 2018-04-20 DIAGNOSIS — R1903 Right lower quadrant abdominal swelling, mass and lump: Secondary | ICD-10-CM | POA: Insufficient documentation

## 2018-04-20 DIAGNOSIS — Z9889 Other specified postprocedural states: Secondary | ICD-10-CM | POA: Diagnosis not present

## 2018-04-20 DIAGNOSIS — R918 Other nonspecific abnormal finding of lung field: Secondary | ICD-10-CM | POA: Diagnosis not present

## 2018-04-20 MED ORDER — IOPAMIDOL (ISOVUE-300) INJECTION 61%
100.0000 mL | Freq: Once | INTRAVENOUS | Status: AC | PRN
  Administered 2018-04-20: 100 mL via INTRAVENOUS

## 2018-04-24 ENCOUNTER — Other Ambulatory Visit: Payer: Self-pay | Admitting: Oncology

## 2018-04-24 ENCOUNTER — Inpatient Hospital Stay: Payer: Medicare Other

## 2018-04-24 ENCOUNTER — Inpatient Hospital Stay: Payer: Medicare Other | Attending: Oncology | Admitting: Oncology

## 2018-04-24 VITALS — BP 145/81 | HR 66 | Temp 95.6°F | Resp 18 | Ht 65.0 in | Wt 188.1 lb

## 2018-04-24 VITALS — BP 145/81 | HR 66 | Temp 95.6°F | Resp 18

## 2018-04-24 DIAGNOSIS — Z5111 Encounter for antineoplastic chemotherapy: Secondary | ICD-10-CM | POA: Insufficient documentation

## 2018-04-24 DIAGNOSIS — Z79899 Other long term (current) drug therapy: Secondary | ICD-10-CM | POA: Diagnosis not present

## 2018-04-24 DIAGNOSIS — D6959 Other secondary thrombocytopenia: Secondary | ICD-10-CM

## 2018-04-24 DIAGNOSIS — Z7689 Persons encountering health services in other specified circumstances: Secondary | ICD-10-CM | POA: Insufficient documentation

## 2018-04-24 DIAGNOSIS — T451X5S Adverse effect of antineoplastic and immunosuppressive drugs, sequela: Secondary | ICD-10-CM | POA: Insufficient documentation

## 2018-04-24 DIAGNOSIS — C189 Malignant neoplasm of colon, unspecified: Secondary | ICD-10-CM

## 2018-04-24 DIAGNOSIS — R911 Solitary pulmonary nodule: Secondary | ICD-10-CM | POA: Diagnosis not present

## 2018-04-24 DIAGNOSIS — C786 Secondary malignant neoplasm of retroperitoneum and peritoneum: Secondary | ICD-10-CM | POA: Insufficient documentation

## 2018-04-24 DIAGNOSIS — R9389 Abnormal findings on diagnostic imaging of other specified body structures: Secondary | ICD-10-CM

## 2018-04-24 DIAGNOSIS — Z87891 Personal history of nicotine dependence: Secondary | ICD-10-CM | POA: Diagnosis not present

## 2018-04-24 DIAGNOSIS — C182 Malignant neoplasm of ascending colon: Secondary | ICD-10-CM | POA: Diagnosis not present

## 2018-04-24 DIAGNOSIS — T451X5A Adverse effect of antineoplastic and immunosuppressive drugs, initial encounter: Secondary | ICD-10-CM

## 2018-04-24 LAB — COMPREHENSIVE METABOLIC PANEL
ALBUMIN: 3.6 g/dL (ref 3.5–5.0)
ALK PHOS: 62 U/L (ref 38–126)
ALT: 12 U/L — AB (ref 14–54)
ANION GAP: 10 (ref 5–15)
AST: 23 U/L (ref 15–41)
BUN: 10 mg/dL (ref 6–20)
CALCIUM: 9.2 mg/dL (ref 8.9–10.3)
CHLORIDE: 105 mmol/L (ref 101–111)
CO2: 23 mmol/L (ref 22–32)
Creatinine, Ser: 0.67 mg/dL (ref 0.44–1.00)
GFR calc Af Amer: >60 mL/min (ref >60)
GFR calc non Af Amer: >60 mL/min (ref >60)
GLUCOSE: 104 mg/dL — AB (ref 65–99)
Potassium: 3.8 mmol/L (ref 3.5–5.1)
SODIUM: 138 mmol/L (ref 135–145)
Total Bilirubin: 0.8 mg/dL (ref 0.3–1.2)
Total Protein: 6.6 g/dL (ref 6.5–8.1)

## 2018-04-24 LAB — CBC WITH DIFFERENTIAL/PLATELET
BASOS PCT: 1 %
Basophils Absolute: 0 10*3/uL (ref 0–0.1)
Eosinophils Absolute: 0 10*3/uL (ref 0–0.7)
Eosinophils Relative: 0 %
HEMATOCRIT: 36.7 % (ref 35.0–47.0)
HEMOGLOBIN: 12.3 g/dL (ref 12.0–16.0)
Lymphocytes Relative: 14 %
Lymphs Abs: 1 10*3/uL (ref 1.0–3.6)
MCH: 32 pg (ref 26.0–34.0)
MCHC: 33.6 g/dL (ref 32.0–36.0)
MCV: 95.2 fL (ref 80.0–100.0)
MONO ABS: 1 10*3/uL — AB (ref 0.2–0.9)
MONOS PCT: 13 %
NEUTROS ABS: 5.2 10*3/uL (ref 1.4–6.5)
Neutrophils Relative %: 72 %
Platelets: 70 10*3/uL — ABNORMAL LOW (ref 150–440)
RBC: 3.85 MIL/uL (ref 3.80–5.20)
RDW: 23.8 % — AB (ref 11.5–14.5)
WBC: 7.3 10*3/uL (ref 3.6–11.0)

## 2018-04-24 MED ORDER — HEPARIN SOD (PORK) LOCK FLUSH 100 UNIT/ML IV SOLN
500.0000 [IU] | Freq: Once | INTRAVENOUS | Status: AC | PRN
  Administered 2018-04-24: 500 [IU]

## 2018-04-24 MED ORDER — SODIUM CHLORIDE 0.9% FLUSH
10.0000 mL | INTRAVENOUS | Status: DC | PRN
  Administered 2018-04-24: 10 mL
  Filled 2018-04-24: qty 10

## 2018-04-24 NOTE — Progress Notes (Signed)
Tumor board 

## 2018-04-24 NOTE — Progress Notes (Signed)
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 She presented to the ER with symptoms of dizziness mostly when she stands up. She has had problems with constipation in the past and takes prune juice to relieve it. Patient is a poor historian at baseline and complained of abdominal pain in ER which led to CT abdomen. 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  CT chest without contrast showed tiny 2 mm right upper lobe pulmonary nodule. No other evidence of metastatic disease. Baseline CEA elevated at 4.8  Patient underwent exploratory laparotomy with right hemicolectomy and terminal ileum resection as well as separate small bowel resection on 12/06/2017. Operative findings showed a large cecal tumor with extramural spread to retroperitoneum mesentery, mesentery lymph nodes and parietal peritoneum. Dense inflammatory response of the retroperitoneum and small bowel.There was a second portion of small bowel that was involved and was resected. A small portion of free tumor was identified in the retroperitoneum which was sent off for examination as well.  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 Radial margin positive for invasive carcinoma 2 colon adenomas 2.5 and 1.8 cm of the ascending colon. Metastatic adenocarcinoma in 4 out  of 13 lymph nodes. 2 tumor deposits Segment of small intestine with abscess which also showed adenocarcinoma invading the distal appendix with perforation. One mesenteric lymph node negative for malignancy. Retroperitoneal tumor removal was also positive for mucinous adenocarcinoma. Tumor was grade 2. Proximal and distal margins were negative.[id number]. MSI stable  Case discussed at tumor board. PET CT will not be done at this time as it may represent post surgical changes. Radiation oncology does not recommend adjuvant RT upon completion of chemotherapy.Plan is to receive 12 cycles or 6 months of adjuvant FOLFOX chemotherapy    Interval history- patient reports feeling well. She has had mild RUQ abdominal pain since yesterday. Last BM 3 days ago. Denies other complaints  ECOG PS- 1 Pain scale- 3 Opioid associated constipation- no  Review of systems- Review of Systems  Constitutional: Negative for chills, fever, malaise/fatigue and weight loss.  HENT: Negative for congestion, ear discharge and nosebleeds.   Eyes: Negative for blurred vision.  Respiratory: Negative for cough, hemoptysis, sputum production, shortness of breath and wheezing.   Cardiovascular: Negative for chest pain, palpitations, orthopnea and claudication.  Gastrointestinal: Positive for abdominal pain. Negative for blood in stool, constipation, diarrhea, heartburn, melena, nausea and vomiting.  Genitourinary: Negative for dysuria, flank pain, frequency, hematuria and urgency.  Musculoskeletal: Negative for back pain, joint pain and myalgias.  Skin: Negative for rash.  Neurological: Negative for dizziness, tingling, focal weakness, seizures, weakness and headaches.  Endo/Heme/Allergies: Does not bruise/bleed easily.  Psychiatric/Behavioral: Negative for depression and suicidal ideas. The patient does not have insomnia.      No Known Allergies   Past Medical History:  Diagnosis Date  . Cancer [facility])      Past  Surgical History:  Procedure Laterality Date  . COLOSTOMY REVISION N/A 12/06/2017   Procedure: COLON RESECTION RIGHT;  Surgeon: Florene Glen, MD;  Location: [facility] ORS;  Service: General;  Laterality: N/A;  . DILATION AND CURETTAGE, DIAGNOSTIC / THERAPEUTIC    . PORTACATH PLACEMENT N/A 01/06/2018   Procedure: INSERTION PORT-A-CATH;  Surgeon: Vickie Epley, MD;  Location: [facility] ORS;  Service: General;  Laterality: N/A;    Social History   Socioeconomic History  . Marital status: Widowed    Spouse name: Not on file  . Number of children: Not on file  . Years of education: Not on file  . Highest education level: Not on file  Occupational History  . Not on file  Social Needs  . Financial resource strain: Patient refused  . Food insecurity:    Worry: Patient refused    Inability: Patient refused  . Transportation needs:    Medical: Patient refused    Non-medical: Patient refused  Tobacco Use  . Smoking status: Former Research scientist (life sciences)  . Smokeless tobacco: Never Used  Substance and Sexual Activity  . Alcohol use: No  . Drug use: No  . Sexual activity: Never  Lifestyle  . Physical activity:    Days per week: Patient refused    Minutes per session: Patient refused  . Stress: Patient refused  Relationships  . Social connections:    Talks on phone: Patient refused    Gets together: Patient refused    Attends religious service: Patient refused    Active member of club or organization: Patient refused    Attends meetings of clubs or organizations: Patient refused    Relationship status: Patient refused  . Intimate partner violence:    Fear of current or ex partner: Patient refused    Emotionally abused: Patient refused    Physically abused: Patient refused    Forced sexual activity: Patient refused  Other Topics Concern  . Not on file  Social History Narrative  . Not on file    Family History  Problem Relation Age of Onset  . ALS Mother   . ALS Father      Current  Outpatient Medications:  .  alendronate (FOSAMAX) 70 MG tablet, Take 70 mg by mouth once a week., Disp: , Rfl:  .  ARIPiprazole (ABILIFY) 5 MG tablet, Take 5 mg by mouth at bedtime. , Disp: , Rfl:  .  benztropine (COGENTIN) 0.5 MG tablet, Take 0.5 mg by mouth at bedtime., Disp: , Rfl:  .  Calcium 600-200 MG-UNIT tablet, Take 1 tablet by mouth daily., Disp: , Rfl:  .  calcium carbonate (TUMS - DOSED IN MG ELEMENTAL CALCIUM) 500 MG chewable tablet, Chew 1 tablet by mouth 2 (two) times daily as needed for indigestion or heartburn., Disp: , Rfl:  .  divalproex (DEPAKOTE) 500 MG DR tablet, Take 500 mg by mouth at bedtime. , Disp: , Rfl:  .  loratadine (CLARITIN) 10 MG tablet, Take 1 tablet (10 mg total) by mouth daily as needed for allergies. Take 1 tablet the day before pump removal, 1 tablet the day of pump removal and 1 tablet the day after pump removal. Take as directed with each cycle of  chemotherapy treatment, Disp: 30 tablet, Rfl: 1 .  memantine (NAMENDA XR) 14 MG CP24 24 hr capsule, Take 14 mg by mouth at bedtime. , Disp: , Rfl:  .  metoprolol succinate (TOPROL-XL) 50 MG 24 hr tablet, Take 50 mg by mouth every morning. Take with or  immediately following a meal. , Disp: , Rfl:  .  Multiple Vitamin (MULTIVITAMIN WITH MINERALS) TABS tablet, Take 1 tablet by mouth daily. (Patient taking differently: Take 1 tablet by mouth daily. ), Disp: 30 tablet, Rfl: 0 .  perphenazine (TRILAFON) 4 MG tablet, Take 4 mg by mouth 2 (two) times daily., Disp: , Rfl:  .  acetaminophen (TYLENOL) 500 MG tablet, Take 1,000 mg by mouth every 8 (eight) hours as needed for mild pain., Disp: , Rfl:  .  dexamethasone (DECADRON) 4 MG tablet, Take 1 tablet (4 mg total) by mouth 2 (two) times daily with a meal. Starting the day after chemotherapy for 2 days (Patient not taking: Reported on 04/24/2018), Disp: 30 tablet, Rfl: 0 .  lidocaine-prilocaine (EMLA) cream, Apply 1 application topically as needed. 1 hour prior to coming for each  chemotherapy treatment. Place small amount over port site and place saran wrap over the cream to protect the clothing (Patient not taking: Reported on 04/24/2018), Disp: 30 g, Rfl: 0 .  ondansetron (ZOFRAN) 8 MG tablet, Take 1 tablet (8 mg total) by mouth 2 (two) times daily as needed for nausea or vomiting. Can start using on day 3 after chemo (Patient not taking: Reported on 04/24/2018), Disp: 20 tablet, Rfl: 0 .  prochlorperazine (COMPAZINE) 10 MG tablet, Take 1 tablet (10 mg total) by mouth every 6 (six) hours as needed for nausea or vomiting. (Patient not taking: Reported on 04/24/2018), Disp: 30 tablet, Rfl: 0 No current facility-administered medications for this visit.   Facility-Administered Medications Ordered in Other Visits:  .  heparin lock flush 100 unit/mL, 500 Units, Intravenous, Once, Randa Evens C, MD .  heparin lock flush 100 unit/mL, 500 Units, Intracatheter, Once PRN, Sindy Guadeloupe, MD .  heparin lock flush 100 unit/mL, 500 Units, Intravenous, Once, Sindy Guadeloupe, MD .  sodium chloride flush (NS) 0.9 % injection 10 mL, 10 mL, Intravenous, PRN, Sindy Guadeloupe, MD, 10 mL at 02/06/18 0845 .  sodium chloride flush (NS) 0.9 % injection 10 mL, 10 mL, Intracatheter, PRN, Sindy Guadeloupe, MD .  sodium chloride flush (NS) 0.9 % injection 10 mL, 10 mL, Intracatheter, PRN, Sindy Guadeloupe, MD, 10 mL at 03/06/18 0900 .  sodium chloride flush (NS) 0.9 % injection 10 mL, 10 mL, Intravenous, PRN, Sindy Guadeloupe, MD, 10 mL at 03/27/18 0909 .  sodium chloride flush (NS) 0.9 % injection 10 mL, 10 mL, Intracatheter, PRN, Sindy Guadeloupe, MD, 10 mL at 04/24/18 0905  Physical exam:  Vitals:   04/24/18 0902 04/24/18 0915  BP: (!) 145/81   Pulse: 66   Resp: 20 18  Temp: (!) 95.6 F (35.3 C)   TempSrc: Tympanic   SpO2:  99%  Weight:  188 lb 0.8 oz (85.3 kg)  Height:  '5\' 5"'  (1.651 m)   Physical Exam  Constitutional: She is oriented to person, place, and time.  HENT:  Head: Normocephalic and  atraumatic.  Eyes: Pupils are equal, round, and reactive to light. EOM are normal.  Neck: Normal range of motion.  Cardiovascular: Normal rate, regular rhythm and normal heart sounds.  Pulmonary/Chest: Effort normal and breath sounds normal.  Abdominal: Soft. Bowel sounds are normal.  Neurological: She is alert and oriented to person, place, and time.  Skin: Skin is warm and dry.     CMP Latest Ref Rng & Units 04/24/2018  Glucose 65 - 99 mg/dL 104(H)  BUN 6 - 20 mg/dL 10  Creatinine 0.44 -  1.00 mg/dL 0.67  Sodium 135 - 145 mmol/L 138  Potassium 3.5 - 5.1 mmol/L 3.8  Chloride 101 - 111 mmol/L 105  CO2 22 - 32 mmol/L 23  Calcium 8.9 - 10.3 mg/dL 9.2  Total Protein 6.5 - 8.1 g/dL 6.6  Total Bilirubin 0.3 - 1.2 mg/dL 0.8  Alkaline Phos 38 - 126 U/L 62  AST 15 - 41 U/L 23  ALT 14 - 54 U/L 12(L)   CBC Latest Ref Rng & Units 04/24/2018  WBC 3.6 - 11.0 K/uL 7.3  Hemoglobin 12.0 - 16.0 g/dL 12.3  Hematocrit 35.0 - 47.0 % 36.7  Platelets 150 - 440 K/uL 70(L)    No images are attached to the encounter.  Ct Chest W Contrast  Result Date: 04/20/2018 CLINICAL DATA:  Patient with history of colon cancer status post hemicolectomy. EXAM: CT CHEST, ABDOMEN, AND PELVIS WITH CONTRAST TECHNIQUE: Multidetector CT imaging of the chest, abdomen and pelvis was performed following the standard protocol during bolus administration of intravenous contrast. CONTRAST:  181m ISOVUE-300 IOPAMIDOL (ISOVUE-300) INJECTION 61% COMPARISON:  CT CAP 12/03/2017 FINDINGS: CT CHEST FINDINGS Cardiovascular: Right anterior chest wall Port-A-Cath is present with tip terminating in the superior vena cava. Trace fluid superior pericardial recess. Normal heart size. Mediastinum/Nodes: No enlarged axillary, mediastinal or hilar lymphadenopathy. Esophagus is normal in appearance. Lungs/Pleura: Central airways are patent. Bandlike atelectasis within the left lower lobe and lingula. No large area of pulmonary consolidation. No pleural  effusion or pneumothorax. Similar-appearing 3 mm right upper lobe nodule (image 36; series 4). Musculoskeletal: Thoracic spine degenerative changes. No aggressive or acute appearing osseous lesions. CT ABDOMEN PELVIS FINDINGS Hepatobiliary: Liver is normal in size and contour. There is patchy low-attenuation peripherally, predominately within the right hepatic lobe (image 55; series 2). Additionally within the right hepatic lobe there is an 8 mm flash filling lesion (image 52; series 2). Gallbladder is unremarkable. No intrahepatic or extrahepatic biliary ductal dilatation. Pancreas: Unremarkable Spleen: Unremarkable Adrenals/Urinary Tract: Stable 1 cm left adrenal nodule and mild right adrenal thickening. Stable 9 mm right adrenal nodule (image 7; series 2). There is a 2 cm cyst within the interpolar region of the left kidney. There is a 5.1 cm cyst exophytic off the inferior pole of the right kidney. No hydronephrosis. Urinary bladder is unremarkable. 2 mm stone interpolar region left kidney (image 82; series 6). Stomach/Bowel: Large amount of stool within the rectum and distal colon. Patient status post distal ileal resection and right hemicolectomy. There is a 2.9 x 1.7 cm soft tissue mass which appears to be involving the distal small bowel within the right lower quadrant (image 98; series 2). Mild nodularity of the peritoneum within the right pericolic gutter (image 73; series 6). Reference nodule measures 1.0 cm (image 82; series 2). Additional small nodules/lymph nodes are demonstrated within mesentery in the right hemiabdomen (image 88; series 2). Normal morphology of the stomach. No free fluid or free intraperitoneal air. Vascular/Lymphatic: Normal caliber abdominal aorta. Peripheral calcified atherosclerotic plaque. No retroperitoneal lymphadenopathy. Reproductive: Uterus and left adnexal structures unremarkable. There is small amount of fluid and irregularity involving the right adnexal structures (image  97; series 2). Other: Small bowel containing right anterior abdominal wall hernia. Musculoskeletal: Lumbar spine degenerative changes. No aggressive or acute appearing osseous lesions. IMPRESSION: 1. Patient status post interval distal ileal resection and ascending colectomy for removal of a large mass. There is peritoneal nodularity within the right pericolic gutter which may represent postsurgical change and/or residual disease. 2. There is  a soft tissue mass which appears to be involving the distal small bowel within the right lower quadrant which may represent residual/recurrent disease. 3. There is soft tissue irregularity and fluid within the right adnexa which may represent postsurgical changes or residual/recurrent disease. 4. Large amount of stool within the rectum as can be seen with constipation. 5. Patchy peripheral low attenuation throughout the liver which may represent interval steatosis. Recommend attention on follow-up. Additionally, this can be further evaluated with MRI as clinically indicated. 6. Indeterminate flash filling lesion within the right hepatic lobe, potentially flash filling hemangioma. Recommend attention on follow-up. 7. Stable bilateral adrenal nodules. Recommend attention on follow-up. Electronically Signed   By: Lovey Newcomer M.D.   On: 04/20/2018 15:33   Ct Abdomen Pelvis W Contrast  Result Date: 04/20/2018 CLINICAL DATA:  Patient with history of colon cancer status post hemicolectomy. EXAM: CT CHEST, ABDOMEN, AND PELVIS WITH CONTRAST TECHNIQUE: Multidetector CT imaging of the chest, abdomen and pelvis was performed following the standard protocol during bolus administration of intravenous contrast. CONTRAST:  13m ISOVUE-300 IOPAMIDOL (ISOVUE-300) INJECTION 61% COMPARISON:  CT CAP 12/03/2017 FINDINGS: CT CHEST FINDINGS Cardiovascular: Right anterior chest wall Port-A-Cath is present with tip terminating in the superior vena cava. Trace fluid superior pericardial recess.  Normal heart size. Mediastinum/Nodes: No enlarged axillary, mediastinal or hilar lymphadenopathy. Esophagus is normal in appearance. Lungs/Pleura: Central airways are patent. Bandlike atelectasis within the left lower lobe and lingula. No large area of pulmonary consolidation. No pleural effusion or pneumothorax. Similar-appearing 3 mm right upper lobe nodule (image 36; series 4). Musculoskeletal: Thoracic spine degenerative changes. No aggressive or acute appearing osseous lesions. CT ABDOMEN PELVIS FINDINGS Hepatobiliary: Liver is normal in size and contour. There is patchy low-attenuation peripherally, predominately within the right hepatic lobe (image 55; series 2). Additionally within the right hepatic lobe there is an 8 mm flash filling lesion (image 52; series 2). Gallbladder is unremarkable. No intrahepatic or extrahepatic biliary ductal dilatation. Pancreas: Unremarkable Spleen: Unremarkable Adrenals/Urinary Tract: Stable 1 cm left adrenal nodule and mild right adrenal thickening. Stable 9 mm right adrenal nodule (image 7; series 2). There is a 2 cm cyst within the interpolar region of the left kidney. There is a 5.1 cm cyst exophytic off the inferior pole of the right kidney. No hydronephrosis. Urinary bladder is unremarkable. 2 mm stone interpolar region left kidney (image 82; series 6). Stomach/Bowel: Large amount of stool within the rectum and distal colon. Patient status post distal ileal resection and right hemicolectomy. There is a 2.9 x 1.7 cm soft tissue mass which appears to be involving the distal small bowel within the right lower quadrant (image 98; series 2). Mild nodularity of the peritoneum within the right pericolic gutter (image 73; series 6). Reference nodule measures 1.0 cm (image 82; series 2). Additional small nodules/lymph nodes are demonstrated within mesentery in the right hemiabdomen (image 88; series 2). Normal morphology of the stomach. No free fluid or free intraperitoneal air.  Vascular/Lymphatic: Normal caliber abdominal aorta. Peripheral calcified atherosclerotic plaque. No retroperitoneal lymphadenopathy. Reproductive: Uterus and left adnexal structures unremarkable. There is small amount of fluid and irregularity involving the right adnexal structures (image 97; series 2). Other: Small bowel containing right anterior abdominal wall hernia. Musculoskeletal: Lumbar spine degenerative changes. No aggressive or acute appearing osseous lesions. IMPRESSION: 1. Patient status post interval distal ileal resection and ascending colectomy for removal of a large mass. There is peritoneal nodularity within the right pericolic gutter which may represent postsurgical change and/or  residual disease. 2. There is a soft tissue mass which appears to be involving the distal small bowel within the right lower quadrant which may represent residual/recurrent disease. 3. There is soft tissue irregularity and fluid within the right adnexa which may represent postsurgical changes or residual/recurrent disease. 4. Large amount of stool within the rectum as can be seen with constipation. 5. Patchy peripheral low attenuation throughout the liver which may represent interval steatosis. Recommend attention on follow-up. Additionally, this can be further evaluated with MRI as clinically indicated. 6. Indeterminate flash filling lesion within the right hepatic lobe, potentially flash filling hemangioma. Recommend attention on follow-up. 7. Stable bilateral adrenal nodules. Recommend attention on follow-up. Electronically Signed   By: Lovey Newcomer M.D.   On: 04/20/2018 15:33     Assessment and plan- Patient is a 72 y.o. female at least stage IIIC pT4b pN2aMxstatus post hemicolectomyhere for on treatment assessment prior to cycle6of adjuvantFOLFOX chemotherapy  Last chemotherapy was 4 weeks ago. 2 weeks ago chemo was held due to thrombocytopenia. Today platelet counts have improved to 70 from 35. I will  still hold chemotherapy until plate;et counts 75. Given significant thrombocytopenia despite reduced dose of oxaliplatin, I will have to hold further doses of oxaliplatin. I will see her back in 1 weeks time with cbc for possible chemotherapy  I have personally reviewed CT abdomen /chest images independently and discussed findings with her and her caregiver. There is peritoneal nodularity noted along with soft tissue mass involving the small bowel and irregularity along the adnexa- which may be resiual disease. Given the extent of her tumor at the time of initial surgery- she likely has gross disease left behind. I will discuss her case at tumor board this week if addition of avastin would be warranted at this time   Encouraged her to try miralax and senna for constipation. Cause of abdominal pain unclear- possibly due to constipation. Continue to monitor    Visit Diagnosis 1. Chemotherapy-induced thrombocytopenia   2. Abnormal CT scan      Dr. Randa Evens, MD, MPH [facility] [phone] 04/24/2018 12:02 PM

## 2018-04-27 ENCOUNTER — Other Ambulatory Visit: Payer: Self-pay | Admitting: Oncology

## 2018-05-01 ENCOUNTER — Inpatient Hospital Stay: Payer: Medicare Other

## 2018-05-01 ENCOUNTER — Encounter: Payer: Self-pay | Admitting: Oncology

## 2018-05-01 ENCOUNTER — Inpatient Hospital Stay (HOSPITAL_BASED_OUTPATIENT_CLINIC_OR_DEPARTMENT_OTHER): Payer: Medicare Other | Admitting: Oncology

## 2018-05-01 ENCOUNTER — Other Ambulatory Visit: Payer: Self-pay | Admitting: *Deleted

## 2018-05-01 VITALS — BP 142/59 | HR 54 | Temp 97.6°F | Resp 18 | Wt 190.1 lb

## 2018-05-01 VITALS — BP 140/82 | HR 60 | Temp 97.0°F | Resp 18

## 2018-05-01 DIAGNOSIS — C182 Malignant neoplasm of ascending colon: Secondary | ICD-10-CM | POA: Diagnosis not present

## 2018-05-01 DIAGNOSIS — T451X5S Adverse effect of antineoplastic and immunosuppressive drugs, sequela: Secondary | ICD-10-CM | POA: Diagnosis not present

## 2018-05-01 DIAGNOSIS — D6959 Other secondary thrombocytopenia: Secondary | ICD-10-CM

## 2018-05-01 DIAGNOSIS — C189 Malignant neoplasm of colon, unspecified: Secondary | ICD-10-CM

## 2018-05-01 DIAGNOSIS — Z79899 Other long term (current) drug therapy: Secondary | ICD-10-CM | POA: Diagnosis not present

## 2018-05-01 DIAGNOSIS — C786 Secondary malignant neoplasm of retroperitoneum and peritoneum: Secondary | ICD-10-CM | POA: Diagnosis not present

## 2018-05-01 DIAGNOSIS — R911 Solitary pulmonary nodule: Secondary | ICD-10-CM

## 2018-05-01 DIAGNOSIS — Z5111 Encounter for antineoplastic chemotherapy: Secondary | ICD-10-CM

## 2018-05-01 DIAGNOSIS — T451X5A Adverse effect of antineoplastic and immunosuppressive drugs, initial encounter: Secondary | ICD-10-CM

## 2018-05-01 LAB — COMPREHENSIVE METABOLIC PANEL
ALT: 13 U/L — AB (ref 14–54)
ANION GAP: 10 (ref 5–15)
AST: 23 U/L (ref 15–41)
Albumin: 3.7 g/dL (ref 3.5–5.0)
Alkaline Phosphatase: 68 U/L (ref 38–126)
BUN: 9 mg/dL (ref 6–20)
CALCIUM: 9.1 mg/dL (ref 8.9–10.3)
CHLORIDE: 99 mmol/L — AB (ref 101–111)
CO2: 23 mmol/L (ref 22–32)
CREATININE: 0.68 mg/dL (ref 0.44–1.00)
Glucose, Bld: 99 mg/dL (ref 65–99)
Potassium: 4.1 mmol/L (ref 3.5–5.1)
Sodium: 132 mmol/L — ABNORMAL LOW (ref 135–145)
Total Bilirubin: 0.9 mg/dL (ref 0.3–1.2)
Total Protein: 6.8 g/dL (ref 6.5–8.1)

## 2018-05-01 LAB — PROTEIN, URINE, RANDOM: TOTAL PROTEIN, URINE: NEGATIVE mg/dL

## 2018-05-01 LAB — CBC WITH DIFFERENTIAL/PLATELET
BASOS PCT: 1 %
Basophils Absolute: 0 10*3/uL (ref 0–0.1)
EOS ABS: 0 10*3/uL (ref 0–0.7)
Eosinophils Relative: 1 %
HCT: 35.4 % (ref 35.0–47.0)
HEMOGLOBIN: 12 g/dL (ref 12.0–16.0)
Lymphocytes Relative: 31 %
Lymphs Abs: 1.2 10*3/uL (ref 1.0–3.6)
MCH: 32.4 pg (ref 26.0–34.0)
MCHC: 34 g/dL (ref 32.0–36.0)
MCV: 95.1 fL (ref 80.0–100.0)
Monocytes Absolute: 0.4 10*3/uL (ref 0.2–0.9)
Monocytes Relative: 12 %
NEUTROS PCT: 55 %
Neutro Abs: 2.1 10*3/uL (ref 1.4–6.5)
Platelets: 96 10*3/uL — ABNORMAL LOW (ref 150–440)
RBC: 3.72 MIL/uL — AB (ref 3.80–5.20)
RDW: 20 % — ABNORMAL HIGH (ref 11.5–14.5)
WBC: 3.8 10*3/uL (ref 3.6–11.0)

## 2018-05-01 MED ORDER — HEPARIN SOD (PORK) LOCK FLUSH 100 UNIT/ML IV SOLN: 500.0000 [IU] | Freq: Once | INTRAVENOUS | Status: DC | PRN

## 2018-05-01 MED ORDER — SODIUM CHLORIDE 0.9 % IV SOLN
900.0000 mg | Freq: Once | INTRAVENOUS | Status: AC
  Administered 2018-05-01: 900 mg via INTRAVENOUS
  Filled 2018-05-01: qty 32

## 2018-05-01 MED ORDER — LEUCOVORIN CALCIUM INJECTION 350 MG: 800.0000 mg | Freq: Once | INTRAVENOUS | Status: DC

## 2018-05-01 MED ORDER — PALONOSETRON HCL INJECTION 0.25 MG/5ML
0.2500 mg | Freq: Once | INTRAVENOUS | Status: AC
  Administered 2018-05-01: 0.25 mg via INTRAVENOUS
  Filled 2018-05-01: qty 5

## 2018-05-01 MED ORDER — LEUCOVORIN CALCIUM INJECTION 100 MG
20.0000 mg/m2 | Freq: Once | INTRAMUSCULAR | Status: AC
  Administered 2018-05-01: 40 mg via INTRAVENOUS
  Filled 2018-05-01: qty 2

## 2018-05-01 MED ORDER — SODIUM CHLORIDE 0.9 % IV SOLN
2400.0000 mg/m2 | INTRAVENOUS | Status: DC
  Administered 2018-05-01: 4800 mg via INTRAVENOUS
  Filled 2018-05-01: qty 96

## 2018-05-01 MED ORDER — SODIUM CHLORIDE 0.9% FLUSH
10.0000 mL | INTRAVENOUS | Status: DC | PRN
  Administered 2018-05-01: 10 mL
  Filled 2018-05-01: qty 10

## 2018-05-01 MED ORDER — DEXAMETHASONE SODIUM PHOSPHATE 10 MG/ML IJ SOLN
10.0000 mg | Freq: Once | INTRAMUSCULAR | Status: AC
  Administered 2018-05-01: 10 mg via INTRAVENOUS
  Filled 2018-05-01: qty 1

## 2018-05-01 MED ORDER — SODIUM CHLORIDE 0.9 % IV SOLN
Freq: Once | INTRAVENOUS | Status: AC
  Administered 2018-05-01: 10:00:00 via INTRAVENOUS
  Filled 2018-05-01: qty 1000

## 2018-05-01 NOTE — Progress Notes (Signed)
No new changes noted today 

## 2018-05-01 NOTE — Progress Notes (Signed)
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 She presented to the ER with symptoms of dizziness mostly when she stands up. She has had problems with constipation in the past and takes prune juice to relieve it. Patient is a poor historian at baseline and complained of abdominal pain in ER which led to CT abdomen. 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  CT chest without contrast showed tiny 2 mm right upper lobe pulmonary nodule. No other evidence of metastatic disease. Baseline CEA elevated at 4.8  Patient underwent exploratory laparotomy with right hemicolectomy and terminal ileum resection as well as separate small bowel resection on 12/06/2017. Operative findings showed a large cecal tumor with extramural spread to retroperitoneum mesentery, mesentery lymph nodes and parietal peritoneum. Dense inflammatory response of the retroperitoneum and small bowel.There was a second portion of small bowel that was involved and was resected. A small portion of free tumor was identified in the retroperitoneum which was sent off for examination as well.  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 Radial margin positive for invasive carcinoma 2 colon adenomas 2.5 and 1.8 cm of the ascending colon. Metastatic adenocarcinoma in 4 out  of 13 lymph nodes. 2 tumor deposits Segment of small intestine with abscess which also showed adenocarcinoma invading the distal appendix with perforation. One mesenteric lymph node negative for malignancy. Retroperitoneal tumor removal was also positive for mucinous adenocarcinoma. Tumor was grade 2. Proximal and distal margins were negative.[id number]. MSI stable. KRAS mutation positive  Case discussed at tumor board. PET CT will not be done at this time as it may represent post surgical changes. Radiation oncology does not recommend adjuvant RT upon completion of chemotherapy.Plan is to receive 12 cycles or 6 months of adjuvant FOLFOX chemotherapy    Interval history- patient feels at her baseline. deneis any fatigue, nausea/ vomiting. No bleeding or bruising  ECOG PS- 1 Pain scale- 0  Review of systems- Review of Systems  Constitutional: Negative for chills, fever, malaise/fatigue and weight loss.  HENT: Negative for congestion, ear discharge and nosebleeds.   Eyes: Negative for blurred vision.  Respiratory: Negative for cough, hemoptysis, sputum production, shortness of breath and wheezing.   Cardiovascular: Negative for chest pain, palpitations, orthopnea and claudication.  Gastrointestinal: Negative for abdominal pain, blood in stool, constipation, diarrhea, heartburn, melena, nausea and vomiting.  Genitourinary: Negative for dysuria, flank pain, frequency, hematuria and urgency.  Musculoskeletal: Negative for back pain, joint pain and myalgias.  Skin: Negative for rash.  Neurological: Negative for dizziness, tingling, focal weakness, seizures, weakness and headaches.  Endo/Heme/Allergies: Does not bruise/bleed easily.  Psychiatric/Behavioral: Negative for depression and suicidal ideas. The patient does not have insomnia.      No Known Allergies   Past Medical History:  Diagnosis Date  . Cancer [facility])      Past Surgical History:  Procedure Laterality Date  .  COLOSTOMY REVISION  N/A 12/06/2017   Procedure: COLON RESECTION RIGHT;  Surgeon: Florene Glen, MD;  Location: [facility] ORS;  Service: General;  Laterality: N/A;  . DILATION AND CURETTAGE, DIAGNOSTIC / THERAPEUTIC    . PORTACATH PLACEMENT N/A 01/06/2018   Procedure: INSERTION PORT-A-CATH;  Surgeon: Vickie Epley, MD;  Location: [facility] ORS;  Service: General;  Laterality: N/A;    Social History   Socioeconomic History  . Marital status: Widowed    Spouse name: Not on file  . Number of children: Not on file  . Years of education: Not on file  . Highest education level: Not on file  Occupational History  . Not on file  Social Needs  . Financial resource strain: Patient refused  . Food insecurity:    Worry: Patient refused    Inability: Patient refused  . Transportation needs:    Medical: Patient refused    Non-medical: Patient refused  Tobacco Use  . Smoking status: Former Research scientist (life sciences)  . Smokeless tobacco: Never Used  Substance and Sexual Activity  . Alcohol use: No  . Drug use: No  . Sexual activity: Never  Lifestyle  . Physical activity:    Days per week: Patient refused    Minutes per session: Patient refused  . Stress: Patient refused  Relationships  . Social connections:    Talks on phone: Patient refused    Gets together: Patient refused    Attends religious service: Patient refused    Active member of club or organization: Patient refused    Attends meetings of clubs or organizations: Patient refused    Relationship status: Patient refused  . Intimate partner violence:    Fear of current or ex partner: Patient refused    Emotionally abused: Patient refused    Physically abused: Patient refused    Forced sexual activity: Patient refused  Other Topics Concern  . Not on file  Social History Narrative  . Not on file    Family History  Problem Relation Age of Onset  . ALS Mother   . ALS Father      Current Outpatient Medications:  .  alendronate (FOSAMAX) 70  MG tablet, Take 70 mg by mouth once a week., Disp: , Rfl:  .  ARIPiprazole (ABILIFY) 5 MG tablet, Take 5 mg by mouth at bedtime. , Disp: , Rfl:  .  benztropine (COGENTIN) 0.5 MG tablet, Take 0.5 mg by mouth at bedtime., Disp: , Rfl:  .  Calcium 600-200 MG-UNIT tablet, Take 1 tablet by mouth daily., Disp: , Rfl:  .  calcium carbonate (TUMS - DOSED IN MG ELEMENTAL CALCIUM) 500 MG chewable tablet, Chew 1 tablet by mouth 2 (two) times daily as needed for indigestion or heartburn., Disp: , Rfl:  .  divalproex (DEPAKOTE) 500 MG DR tablet, Take 500 mg by mouth at bedtime. , Disp: , Rfl:  .  loratadine (CLARITIN) 10 MG tablet, Take 1 tablet (10 mg total) by mouth daily as needed for allergies. Take 1 tablet the day before pump removal, 1 tablet the day of pump removal and 1 tablet the day after pump removal. Take as directed with each cycle of  chemotherapy treatment, Disp: 30 tablet, Rfl: 1 .  memantine (NAMENDA XR) 14 MG CP24 24 hr capsule, Take 14 mg by mouth at bedtime. , Disp: , Rfl:  .  metoprolol succinate (TOPROL-XL) 50 MG 24 hr tablet, Take 50 mg by mouth every morning. Take with or immediately following a meal. , Disp: , Rfl:  .  Multiple Vitamin (MULTIVITAMIN WITH MINERALS) TABS tablet, Take 1 tablet by mouth daily. (Patient taking differently: Take 1 tablet by mouth daily. ), Disp: 30 tablet, Rfl: 0 .  perphenazine (TRILAFON) 4 MG tablet, Take 4 mg by mouth 2 (two) times daily., Disp: , Rfl:  .  acetaminophen (TYLENOL) 500 MG tablet, Take 1,000 mg by mouth every 8 (eight) hours as needed for mild pain., Disp: , Rfl:  .  dexamethasone (DECADRON) 4 MG tablet, Take 1 tablet (4 mg total) by mouth 2 (two) times daily with a meal. Starting the day after chemotherapy for 2 days (Patient not taking: Reported on 04/24/2018), Disp: 30 tablet, Rfl: 0 .  lidocaine-prilocaine (EMLA) cream, Apply 1 application topically as needed. 1 hour prior to coming for each chemotherapy treatment. Place small amount over port  site and place saran wrap over the cream to protect the clothing (Patient not taking: Reported on 04/24/2018), Disp: 30 g, Rfl: 0 .  ondansetron (ZOFRAN) 8 MG tablet, Take 1 tablet (8 mg total) by mouth 2 (two) times daily as needed for nausea or vomiting. Can start using on day 3 after chemo (Patient not taking: Reported on 04/24/2018), Disp: 20 tablet, Rfl: 0 .  prochlorperazine (COMPAZINE) 10 MG tablet, Take 1 tablet (10 mg total) by mouth every 6 (six) hours as needed for nausea or vomiting. (Patient not taking: Reported on 04/24/2018), Disp: 30 tablet, Rfl: 0 No current facility-administered medications for this visit.   Facility-Administered Medications Ordered in Other Visits:  .  0.9 %  sodium chloride infusion, , Intravenous, Once, Sindy Guadeloupe, MD .  fluorouracil (ADRUCIL) 4,800 mg in sodium chloride 0.9 % 54 mL chemo infusion, 2,400 mg/m2 (Treatment Plan Recorded), Intravenous, 1 day or 1 dose, Sindy Guadeloupe, MD .  heparin lock flush 100 unit/mL, 500 Units, Intravenous, Once, Sindy Guadeloupe, MD .  heparin lock flush 100 unit/mL, 500 Units, Intracatheter, Once PRN, Sindy Guadeloupe, MD .  heparin lock flush 100 unit/mL, 500 Units, Intravenous, Once, Sindy Guadeloupe, MD .  heparin lock flush 100 unit/mL, 500 Units, Intracatheter, Once PRN, Sindy Guadeloupe, MD .  leucovorin injection 40 mg, 20 mg/m2, Intravenous, Once, Sindy Guadeloupe, MD .  sodium chloride flush (NS) 0.9 % injection 10 mL, 10 mL, Intravenous, PRN, Sindy Guadeloupe, MD, 10 mL at 02/06/18 0845 .  sodium chloride flush (NS) 0.9 % injection 10 mL, 10 mL, Intracatheter, PRN, Sindy Guadeloupe, MD .  sodium chloride flush (NS) 0.9 % injection 10 mL, 10 mL, Intracatheter, PRN, Sindy Guadeloupe, MD, 10 mL at 03/06/18 0900 .  sodium chloride flush (NS) 0.9 % injection 10 mL, 10 mL, Intravenous, PRN, Sindy Guadeloupe, MD, 10 mL at 03/27/18 0909 .  sodium chloride flush (NS) 0.9 % injection 10 mL, 10 mL, Intracatheter, PRN, Sindy Guadeloupe, MD, 10 mL  at 05/01/18 0850  Physical exam:  Vitals:   05/01/18 0844  BP: (!) 142/59  Pulse: (!) 54  Resp: 18  Temp: 97.6 F (36.4 C)  TempSrc: Tympanic  SpO2: 100%  Weight: 190 lb 2.4 oz (86.3 kg)   Physical Exam  Constitutional: She is oriented to person, place, and time. She appears well-developed and well-nourished.  HENT:  Head: Normocephalic and atraumatic.  Eyes: Pupils are equal, round, and reactive to light. EOM are normal.  Neck: Normal range of motion.  Cardiovascular: Normal rate, regular rhythm and normal heart sounds.  Pulmonary/Chest: Effort normal and breath sounds normal.  Abdominal: Soft. Bowel sounds are normal.  Musculoskeletal: She exhibits edema (b/l ankle edema).  Neurological: She is alert and oriented to person, place, and time.  Skin: Skin is warm and dry.     CMP Latest Ref Rng & Units 05/01/2018  Glucose 65 - 99 mg/dL 99  BUN 6 - 20 mg/dL 9  Creatinine 0.44 - 1.00 mg/dL 0.68  Sodium 135 - 145 mmol/L 132(L)  Potassium 3.5 - 5.1 mmol/L 4.1  Chloride 101 - 111 mmol/L 99(L)  CO2 22 - 32 mmol/L 23  Calcium 8.9 - 10.3 mg/dL 9.1  Total Protein 6.5 - 8.1 g/dL 6.8  Total Bilirubin 0.3 - 1.2 mg/dL 0.9  Alkaline Phos 38 - 126 U/L 68  AST 15 - 41 U/L 23  ALT 14 - 54 U/L 13(L)   CBC Latest Ref Rng & Units 05/01/2018  WBC 3.6 - 11.0 K/uL 3.8  Hemoglobin 12.0 - 16.0 g/dL 12.0  Hematocrit 35.0 - 47.0 % 35.4  Platelets 150 - 440 K/uL 96(L)    No images are attached to the encounter.  Ct Chest W Contrast  Result Date: 04/20/2018 CLINICAL DATA:  Patient with history of colon cancer status post hemicolectomy. EXAM: CT CHEST, ABDOMEN, AND PELVIS WITH CONTRAST TECHNIQUE: Multidetector CT imaging of the chest, abdomen and pelvis was performed following the standard protocol during bolus administration of intravenous contrast. CONTRAST:  182m ISOVUE-300 IOPAMIDOL (ISOVUE-300) INJECTION 61% COMPARISON:  CT CAP 12/03/2017 FINDINGS: CT CHEST FINDINGS Cardiovascular: Right  anterior chest wall Port-A-Cath is present with tip terminating in the superior vena cava. Trace fluid superior pericardial recess. Normal heart size. Mediastinum/Nodes: No enlarged axillary, mediastinal or hilar lymphadenopathy. Esophagus is normal in appearance. Lungs/Pleura: Central airways are patent. Bandlike atelectasis within the left lower lobe and lingula. No large area of pulmonary consolidation. No pleural effusion or pneumothorax. Similar-appearing 3 mm right upper lobe nodule (image 36; series 4). Musculoskeletal: Thoracic spine degenerative changes. No aggressive or acute appearing osseous lesions. CT ABDOMEN PELVIS FINDINGS Hepatobiliary: Liver is normal in size and contour. There is patchy low-attenuation peripherally, predominately within the right hepatic lobe (image 55; series 2). Additionally within the right hepatic lobe there is an 8 mm flash filling lesion (image 52; series 2). Gallbladder is unremarkable. No intrahepatic or extrahepatic biliary ductal dilatation. Pancreas: Unremarkable Spleen: Unremarkable Adrenals/Urinary Tract: Stable 1 cm left adrenal nodule and mild right adrenal thickening. Stable 9 mm right adrenal nodule (image 7; series 2). There is a 2 cm cyst within the interpolar region of the left kidney. There is a 5.1 cm cyst exophytic off the inferior pole of the right kidney. No hydronephrosis. Urinary bladder is unremarkable. 2 mm stone interpolar region left kidney (image 82; series 6). Stomach/Bowel: Large amount of stool within the rectum and distal colon. Patient status post distal ileal resection and right hemicolectomy. There is a 2.9 x 1.7 cm soft tissue mass which appears to be involving the distal small bowel within the right lower quadrant (image 98; series 2). Mild nodularity of the peritoneum within the right pericolic gutter (image 73; series 6). Reference nodule measures 1.0 cm (image 82; series 2). Additional small nodules/lymph nodes are demonstrated within  mesentery in the right hemiabdomen (image 88; series 2). Normal morphology of the stomach. No free fluid or free intraperitoneal air. Vascular/Lymphatic: Normal caliber abdominal aorta. Peripheral calcified atherosclerotic plaque. No retroperitoneal lymphadenopathy. Reproductive: Uterus and left adnexal structures unremarkable. There is small amount of fluid and irregularity involving the right adnexal structures (image  97; series 2). Other: Small bowel containing right anterior abdominal wall hernia. Musculoskeletal: Lumbar spine degenerative changes. No aggressive or acute appearing osseous lesions. IMPRESSION: 1. Patient status post interval distal ileal resection and ascending colectomy for removal of a large mass. There is peritoneal nodularity within the right pericolic gutter which may represent postsurgical change and/or residual disease. 2. There is a soft tissue mass which appears to be involving the distal small bowel within the right lower quadrant which may represent residual/recurrent disease. 3. There is soft tissue irregularity and fluid within the right adnexa which may represent postsurgical changes or residual/recurrent disease. 4. Large amount of stool within the rectum as can be seen with constipation. 5. Patchy peripheral low attenuation throughout the liver which may represent interval steatosis. Recommend attention on follow-up. Additionally, this can be further evaluated with MRI as clinically indicated. 6. Indeterminate flash filling lesion within the right hepatic lobe, potentially flash filling hemangioma. Recommend attention on follow-up. 7. Stable bilateral adrenal nodules. Recommend attention on follow-up. Electronically Signed   By: Lovey Newcomer M.D.   On: 04/20/2018 15:33   Ct Abdomen Pelvis W Contrast  Result Date: 04/20/2018 CLINICAL DATA:  Patient with history of colon cancer status post hemicolectomy. EXAM: CT CHEST, ABDOMEN, AND PELVIS WITH CONTRAST TECHNIQUE: Multidetector  CT imaging of the chest, abdomen and pelvis was performed following the standard protocol during bolus administration of intravenous contrast. CONTRAST:  140m ISOVUE-300 IOPAMIDOL (ISOVUE-300) INJECTION 61% COMPARISON:  CT CAP 12/03/2017 FINDINGS: CT CHEST FINDINGS Cardiovascular: Right anterior chest wall Port-A-Cath is present with tip terminating in the superior vena cava. Trace fluid superior pericardial recess. Normal heart size. Mediastinum/Nodes: No enlarged axillary, mediastinal or hilar lymphadenopathy. Esophagus is normal in appearance. Lungs/Pleura: Central airways are patent. Bandlike atelectasis within the left lower lobe and lingula. No large area of pulmonary consolidation. No pleural effusion or pneumothorax. Similar-appearing 3 mm right upper lobe nodule (image 36; series 4). Musculoskeletal: Thoracic spine degenerative changes. No aggressive or acute appearing osseous lesions. CT ABDOMEN PELVIS FINDINGS Hepatobiliary: Liver is normal in size and contour. There is patchy low-attenuation peripherally, predominately within the right hepatic lobe (image 55; series 2). Additionally within the right hepatic lobe there is an 8 mm flash filling lesion (image 52; series 2). Gallbladder is unremarkable. No intrahepatic or extrahepatic biliary ductal dilatation. Pancreas: Unremarkable Spleen: Unremarkable Adrenals/Urinary Tract: Stable 1 cm left adrenal nodule and mild right adrenal thickening. Stable 9 mm right adrenal nodule (image 7; series 2). There is a 2 cm cyst within the interpolar region of the left kidney. There is a 5.1 cm cyst exophytic off the inferior pole of the right kidney. No hydronephrosis. Urinary bladder is unremarkable. 2 mm stone interpolar region left kidney (image 82; series 6). Stomach/Bowel: Large amount of stool within the rectum and distal colon. Patient status post distal ileal resection and right hemicolectomy. There is a 2.9 x 1.7 cm soft tissue mass which appears to be  involving the distal small bowel within the right lower quadrant (image 98; series 2). Mild nodularity of the peritoneum within the right pericolic gutter (image 73; series 6). Reference nodule measures 1.0 cm (image 82; series 2). Additional small nodules/lymph nodes are demonstrated within mesentery in the right hemiabdomen (image 88; series 2). Normal morphology of the stomach. No free fluid or free intraperitoneal air. Vascular/Lymphatic: Normal caliber abdominal aorta. Peripheral calcified atherosclerotic plaque. No retroperitoneal lymphadenopathy. Reproductive: Uterus and left adnexal structures unremarkable. There is small amount of fluid and irregularity involving  the right adnexal structures (image 97; series 2). Other: Small bowel containing right anterior abdominal wall hernia. Musculoskeletal: Lumbar spine degenerative changes. No aggressive or acute appearing osseous lesions. IMPRESSION: 1. Patient status post interval distal ileal resection and ascending colectomy for removal of a large mass. There is peritoneal nodularity within the right pericolic gutter which may represent postsurgical change and/or residual disease. 2. There is a soft tissue mass which appears to be involving the distal small bowel within the right lower quadrant which may represent residual/recurrent disease. 3. There is soft tissue irregularity and fluid within the right adnexa which may represent postsurgical changes or residual/recurrent disease. 4. Large amount of stool within the rectum as can be seen with constipation. 5. Patchy peripheral low attenuation throughout the liver which may represent interval steatosis. Recommend attention on follow-up. Additionally, this can be further evaluated with MRI as clinically indicated. 6. Indeterminate flash filling lesion within the right hepatic lobe, potentially flash filling hemangioma. Recommend attention on follow-up. 7. Stable bilateral adrenal nodules. Recommend attention on  follow-up. Electronically Signed   By: Lovey Newcomer M.D.   On: 04/20/2018 15:33     Assessment and plan- Patient is a 72 y.o. female at least stage IIIC pT4b pN2aMxstatus post hemicolectomyhere for on treatment assessment prior to cycle 6 of FOLFOX chemotherapy  I reviewed patients CT scan at tumor board last week and obtained consensus from my colleagues as well. Given the peritoneal nodularity along with soft tissue mass in the small bowel- this likely represents metastatic disease. I doubt there will be a role for reeat surgery in the future but this can be looked into after she completes 12 cycles of chemotherapy  Patient has had had prolonged thrombocytopenia now 5 weeks post chemotherapy. I will therefore drop her oxaliplatin at this point. I will also hold the 5FU bolus today and only proceed with LV and infusional 5FU. I will however add avastin at this time given that I am treating her as stage IV disease. Discussed risks and benefits of avastin including all but not limited to heart attacks, stroke and risk of thromboembolic events. Patient and her caregiver understand and she is willing to proceed as planned.  Check urine protein today and in 2 weeks. I will see her back in 2 weeks with cbc/cmp for cycle 7 of 5FU chemotherapy with avastin. Based on her counts, I will decide about adding back the 5FU bolus. She gets neulasta with every cycle.      Visit Diagnosis 1. Colon adenocarcinoma ([place])   2. Encounter for antineoplastic chemotherapy   3. Chemotherapy-induced thrombocytopenia      Dr. Randa Evens, MD, MPH [facility] [phone] 05/01/2018 11:14 AM

## 2018-05-02 LAB — CEA: CEA: 3.8 ng/mL (ref 0.0–4.7)

## 2018-05-03 ENCOUNTER — Inpatient Hospital Stay: Payer: Medicare Other

## 2018-05-03 VITALS — BP 142/81 | HR 57 | Temp 95.1°F | Resp 18

## 2018-05-03 DIAGNOSIS — C189 Malignant neoplasm of colon, unspecified: Secondary | ICD-10-CM

## 2018-05-03 DIAGNOSIS — Z5111 Encounter for antineoplastic chemotherapy: Secondary | ICD-10-CM | POA: Diagnosis not present

## 2018-05-03 MED ORDER — PEGFILGRASTIM INJECTION 6 MG/0.6ML ~~LOC~~
6.0000 mg | PREFILLED_SYRINGE | Freq: Once | SUBCUTANEOUS | Status: AC
  Administered 2018-05-03: 6 mg via SUBCUTANEOUS

## 2018-05-03 MED ORDER — HEPARIN SOD (PORK) LOCK FLUSH 100 UNIT/ML IV SOLN
500.0000 [IU] | Freq: Once | INTRAVENOUS | Status: AC | PRN
  Administered 2018-05-03: 500 [IU]

## 2018-05-03 MED ORDER — SODIUM CHLORIDE 0.9% FLUSH
10.0000 mL | INTRAVENOUS | Status: DC | PRN
  Administered 2018-05-03: 10 mL
  Filled 2018-05-03: qty 10

## 2018-05-15 ENCOUNTER — Inpatient Hospital Stay: Payer: Medicare Other

## 2018-05-15 ENCOUNTER — Telehealth: Payer: Self-pay

## 2018-05-15 ENCOUNTER — Encounter: Payer: Self-pay | Admitting: Oncology

## 2018-05-15 ENCOUNTER — Inpatient Hospital Stay (HOSPITAL_BASED_OUTPATIENT_CLINIC_OR_DEPARTMENT_OTHER): Payer: Medicare Other | Admitting: Oncology

## 2018-05-15 VITALS — BP 154/66 | HR 65 | Temp 97.9°F | Resp 18 | Wt 190.5 lb

## 2018-05-15 DIAGNOSIS — C189 Malignant neoplasm of colon, unspecified: Secondary | ICD-10-CM

## 2018-05-15 DIAGNOSIS — Z79899 Other long term (current) drug therapy: Secondary | ICD-10-CM | POA: Diagnosis not present

## 2018-05-15 DIAGNOSIS — T451X5S Adverse effect of antineoplastic and immunosuppressive drugs, sequela: Secondary | ICD-10-CM

## 2018-05-15 DIAGNOSIS — Z5112 Encounter for antineoplastic immunotherapy: Secondary | ICD-10-CM

## 2018-05-15 DIAGNOSIS — D6959 Other secondary thrombocytopenia: Secondary | ICD-10-CM | POA: Diagnosis not present

## 2018-05-15 DIAGNOSIS — Z5111 Encounter for antineoplastic chemotherapy: Secondary | ICD-10-CM | POA: Diagnosis not present

## 2018-05-15 DIAGNOSIS — C786 Secondary malignant neoplasm of retroperitoneum and peritoneum: Secondary | ICD-10-CM

## 2018-05-15 DIAGNOSIS — C182 Malignant neoplasm of ascending colon: Secondary | ICD-10-CM

## 2018-05-15 DIAGNOSIS — R911 Solitary pulmonary nodule: Secondary | ICD-10-CM

## 2018-05-15 DIAGNOSIS — T451X5A Adverse effect of antineoplastic and immunosuppressive drugs, initial encounter: Secondary | ICD-10-CM

## 2018-05-15 LAB — CBC WITH DIFFERENTIAL/PLATELET
BASOS PCT: 1 %
Basophils Absolute: 0 10*3/uL (ref 0–0.1)
EOS ABS: 0.1 10*3/uL (ref 0–0.7)
EOS PCT: 1 %
HCT: 37.7 % (ref 35.0–47.0)
Hemoglobin: 12.9 g/dL (ref 12.0–16.0)
LYMPHS ABS: 1.4 10*3/uL (ref 1.0–3.6)
Lymphocytes Relative: 21 %
MCH: 33.1 pg (ref 26.0–34.0)
MCHC: 34.1 g/dL (ref 32.0–36.0)
MCV: 96.9 fL (ref 80.0–100.0)
MONOS PCT: 6 %
Monocytes Absolute: 0.4 10*3/uL (ref 0.2–0.9)
Neutro Abs: 4.7 10*3/uL (ref 1.4–6.5)
Neutrophils Relative %: 71 %
Platelets: 108 10*3/uL — ABNORMAL LOW (ref 150–440)
RBC: 3.89 MIL/uL (ref 3.80–5.20)
RDW: 18.3 % — ABNORMAL HIGH (ref 11.5–14.5)
WBC: 6.6 10*3/uL (ref 3.6–11.0)

## 2018-05-15 LAB — COMPREHENSIVE METABOLIC PANEL
ALBUMIN: 3.8 g/dL (ref 3.5–5.0)
ALK PHOS: 90 U/L (ref 38–126)
ALT: 14 U/L (ref 14–54)
AST: 23 U/L (ref 15–41)
Anion gap: 11 (ref 5–15)
BUN: 9 mg/dL (ref 6–20)
CALCIUM: 9.1 mg/dL (ref 8.9–10.3)
CHLORIDE: 103 mmol/L (ref 101–111)
CO2: 22 mmol/L (ref 22–32)
CREATININE: 0.76 mg/dL (ref 0.44–1.00)
GFR calc non Af Amer: >60 mL/min (ref >60)
GLUCOSE: 106 mg/dL — AB (ref 65–99)
Potassium: 3.8 mmol/L (ref 3.5–5.1)
SODIUM: 136 mmol/L (ref 135–145)
Total Bilirubin: 0.7 mg/dL (ref 0.3–1.2)
Total Protein: 6.8 g/dL (ref 6.5–8.1)

## 2018-05-15 LAB — PROTEIN, URINE, RANDOM: TOTAL PROTEIN, URINE: 30 mg/dL

## 2018-05-15 MED ORDER — SODIUM CHLORIDE 0.9 % IV SOLN
Freq: Once | INTRAVENOUS | Status: AC
  Administered 2018-05-15: 10:00:00 via INTRAVENOUS
  Filled 2018-05-15: qty 1000

## 2018-05-15 MED ORDER — DEXAMETHASONE SODIUM PHOSPHATE 10 MG/ML IJ SOLN
10.0000 mg | Freq: Once | INTRAMUSCULAR | Status: AC
  Administered 2018-05-15: 10 mg via INTRAVENOUS

## 2018-05-15 MED ORDER — PALONOSETRON HCL INJECTION 0.25 MG/5ML
INTRAVENOUS | Status: AC
  Filled 2018-05-15: qty 5

## 2018-05-15 MED ORDER — PALONOSETRON HCL INJECTION 0.25 MG/5ML
0.2500 mg | Freq: Once | INTRAVENOUS | Status: AC
  Administered 2018-05-15: 0.25 mg via INTRAVENOUS

## 2018-05-15 MED ORDER — FLUOROURACIL CHEMO INJECTION 2.5 GM/50ML
400.0000 mg/m2 | Freq: Once | INTRAVENOUS | Status: AC
  Administered 2018-05-15: 800 mg via INTRAVENOUS
  Filled 2018-05-15: qty 16

## 2018-05-15 MED ORDER — LEUCOVORIN CALCIUM INJECTION 100 MG
20.0000 mg/m2 | Freq: Once | INTRAMUSCULAR | Status: AC
  Administered 2018-05-15: 40 mg via INTRAVENOUS
  Filled 2018-05-15: qty 2

## 2018-05-15 MED ORDER — LEUCOVORIN CALCIUM INJECTION 350 MG: 800.0000 mg | Freq: Once | INTRAVENOUS | Status: DC

## 2018-05-15 MED ORDER — DEXAMETHASONE SODIUM PHOSPHATE 10 MG/ML IJ SOLN
INTRAMUSCULAR | Status: AC
  Filled 2018-05-15: qty 1

## 2018-05-15 MED ORDER — SODIUM CHLORIDE 0.9 % IV SOLN: 10.0000 mg | Freq: Once | INTRAVENOUS | Status: DC

## 2018-05-15 MED ORDER — DEXTROSE 5 % IV SOLN
Freq: Once | INTRAVENOUS | Status: DC
  Filled 2018-05-15: qty 1000

## 2018-05-15 MED ORDER — SODIUM CHLORIDE 0.9 % IV SOLN
2400.0000 mg/m2 | INTRAVENOUS | Status: AC
  Administered 2018-05-15: 4800 mg via INTRAVENOUS
  Filled 2018-05-15: qty 96

## 2018-05-15 MED ORDER — SODIUM CHLORIDE 0.9 % IV SOLN
900.0000 mg | Freq: Once | INTRAVENOUS | Status: AC
  Administered 2018-05-15: 900 mg via INTRAVENOUS
  Filled 2018-05-15: qty 32

## 2018-05-15 NOTE — Progress Notes (Signed)
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 She presented to the ER with symptoms of dizziness mostly when she stands up. She has had problems with constipation in the past and takes prune juice to relieve it. Patient is a poor historian at baseline and complained of abdominal pain in ER which led to CT abdomen. 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  CT chest without contrast showed tiny 2 mm right upper lobe pulmonary nodule. No other evidence of metastatic disease. Baseline CEA elevated at 4.8  Patient underwent exploratory laparotomy with right hemicolectomy and terminal ileum resection as well as separate small bowel resection on 12/06/2017. Operative findings showed a large cecal tumor with extramural spread to retroperitoneum mesentery, mesentery lymph nodes and parietal peritoneum. Dense inflammatory response of the retroperitoneum and small bowel.There was a second portion of small bowel that was involved and was resected. A small portion of free tumor was identified in the retroperitoneum which was sent off for examination as well.  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 Radial margin positive for invasive carcinoma 2 colon adenomas 2.5 and 1.8 cm of the ascending colon. Metastatic adenocarcinoma in 4 out  of 13 lymph nodes. 2 tumor deposits Segment of small intestine with abscess which also showed adenocarcinoma invading the distal appendix with perforation. One mesenteric lymph node negative for malignancy. Retroperitoneal tumor removal was also positive for mucinous adenocarcinoma. Tumor was grade 2. Proximal and distal margins were negative.[id number]. MSI stable. KRAS mutation positive  Case discussed at tumor board. PET CT will not be done at this time as it may represent post surgical changes. Radiation oncology does not recommend adjuvant RT upon completion of chemotherapy.she was found to have recurrent small bowel soft tissue mass and peritoneal nodularity after 5 cycles of folfox. FOLFOX avastin to be given with a palliative intent   Interval history- she is tolerating chemotherapy well so far without significant side effects. No nausea, vomiting or fatigue.   ECOG PS- 1 Pain scale- 0   Review of systems- Review of Systems  Constitutional: Negative for chills, fever, malaise/fatigue and weight loss.  HENT: Negative for congestion, ear discharge and nosebleeds.   Eyes: Negative for blurred vision.  Respiratory: Negative for cough, hemoptysis, sputum production, shortness of breath and wheezing.   Cardiovascular: Negative for chest pain, palpitations, orthopnea and claudication.  Gastrointestinal: Negative for abdominal pain, blood in stool, constipation, diarrhea, heartburn, melena, nausea and vomiting.  Genitourinary: Negative for dysuria, flank pain, frequency, hematuria and urgency.  Musculoskeletal: Negative for back pain, joint pain and myalgias.  Skin: Negative for rash.  Neurological: Negative for dizziness, tingling, focal weakness, seizures, weakness and headaches.  Endo/Heme/Allergies: Does not bruise/bleed easily.  Psychiatric/Behavioral: Negative for depression and suicidal ideas. The patient does not have insomnia.       No Known Allergies   Past Medical  History:  Diagnosis Date  .  [facility])      Past Surgical History:  Procedure Laterality Date  . COLOSTOMY REVISION N/A 12/06/2017   Procedure: COLON RESECTION RIGHT;  Surgeon: Florene Glen, MD;  Location: [facility] ORS;  Service: General;  Laterality: N/A;  . DILATION AND CURETTAGE, DIAGNOSTIC / THERAPEUTIC    . PORTACATH PLACEMENT N/A 01/06/2018   Procedure: INSERTION PORT-A-CATH;  Surgeon: Vickie Epley, MD;  Location: [facility] ORS;  Service: General;  Laterality: N/A;    Social History   Socioeconomic History  . Marital status: Widowed    Spouse name: Not on file  . Number of children: Not on file  . Years of education: Not on file  . Highest education level: Not on file  Occupational History  . Not on file  Social Needs  . Financial resource strain: Patient refused  . Food insecurity:    Worry: Patient refused    Inability: Patient refused  . Transportation needs:    Medical: Patient refused    Non-medical: Patient refused  Tobacco Use  . Smoking status: Former Research scientist (life sciences)  . Smokeless tobacco: Never Used  Substance and Sexual Activity  . Alcohol use: No  . Drug use: No  . Sexual activity: Never  Lifestyle  . Physical activity:    Days per week: Patient refused    Minutes per session: Patient refused  . Stress: Patient refused  Relationships  . Social connections:    Talks on phone: Patient refused    Gets together: Patient refused    Attends religious service: Patient refused    Active member of club or organization: Patient refused    Attends meetings of clubs or organizations: Patient refused    Relationship status: Patient refused  . Intimate partner violence:    Fear of current or ex partner: Patient refused    Emotionally abused: Patient refused    Physically abused: Patient refused    Forced sexual activity: Patient refused  Other Topics Concern  . Not on file  Social History Narrative  . Not on file    Family History  Problem Relation Age of  Onset  . ALS Mother   . ALS Father      Current Outpatient Medications:  .  alendronate (FOSAMAX) 70 MG tablet, Take 70 mg by mouth once a week., Disp: , Rfl:  .  ARIPiprazole (ABILIFY) 5 MG tablet, Take 5 mg by mouth at bedtime. , Disp: , Rfl:  .  benztropine (COGENTIN) 0.5 MG tablet, Take 0.5 mg by mouth at bedtime., Disp: , Rfl:  .  Calcium 600-200 MG-UNIT tablet, Take 1 tablet by mouth daily., Disp: , Rfl:  .  calcium carbonate (TUMS - DOSED IN MG ELEMENTAL CALCIUM) 500 MG chewable tablet, Chew 1 tablet by mouth 2 (two) times daily as needed for indigestion or heartburn., Disp: , Rfl:  .  divalproex (DEPAKOTE) 500 MG DR tablet, Take 500 mg by mouth at bedtime. , Disp: , Rfl:  .  loratadine (CLARITIN) 10 MG tablet, Take 1 tablet (10 mg total) by mouth daily as needed for allergies. Take 1 tablet the day before pump removal, 1 tablet the day of pump removal and 1 tablet the day after pump removal. Take as directed with each cycle of  chemotherapy treatment, Disp: 30 tablet, Rfl: 1 .  memantine (NAMENDA XR) 14 MG CP24 24 hr capsule, Take 14 mg by mouth at bedtime. , Disp: , Rfl:  .  metoprolol succinate (TOPROL-XL) 50 MG 24 hr tablet, Take 50  mg by mouth every morning. Take with or immediately following a meal. , Disp: , Rfl:  .  perphenazine (TRILAFON) 4 MG tablet, Take 4 mg by mouth 2 (two) times daily., Disp: , Rfl:  .  acetaminophen (TYLENOL) 500 MG tablet, Take 1,000 mg by mouth every 8 (eight) hours as needed for mild pain., Disp: , Rfl:  .  dexamethasone (DECADRON) 4 MG tablet, Take 1 tablet (4 mg total) by mouth 2 (two) times daily with a meal. Starting the day after chemotherapy for 2 days (Patient not taking: Reported on 04/24/2018), Disp: 30 tablet, Rfl: 0 .  lidocaine-prilocaine (EMLA) cream, Apply 1 application topically as needed. 1 hour prior to coming for each chemotherapy treatment. Place small amount over port site and place saran wrap over the cream to protect the clothing  (Patient not taking: Reported on 04/24/2018), Disp: 30 g, Rfl: 0 .  Multiple Vitamin (MULTIVITAMIN WITH MINERALS) TABS tablet, Take 1 tablet by mouth daily. (Patient not taking: Reported on 05/15/2018), Disp: 30 tablet, Rfl: 0 .  ondansetron (ZOFRAN) 8 MG tablet, Take 1 tablet (8 mg total) by mouth 2 (two) times daily as needed for nausea or vomiting. Can start using on day 3 after chemo (Patient not taking: Reported on 04/24/2018), Disp: 20 tablet, Rfl: 0 .  prochlorperazine (COMPAZINE) 10 MG tablet, Take 1 tablet (10 mg total) by mouth every 6 (six) hours as needed for nausea or vomiting. (Patient not taking: Reported on 04/24/2018), Disp: 30 tablet, Rfl: 0 No current facility-administered medications for this visit.   Facility-Administered Medications Ordered in Other Visits:  .  dextrose 5 % solution, , Intravenous, Once, Sindy Guadeloupe, MD .  fluorouracil (ADRUCIL) 4,800 mg in sodium chloride 0.9 % 54 mL chemo infusion, 2,400 mg/m2 (Treatment Plan Recorded), Intravenous, 1 day or 1 dose, Sindy Guadeloupe, MD, 4,800 mg at 05/15/18 1036 .  heparin lock flush 100 unit/mL, 500 Units, Intravenous, Once, Sindy Guadeloupe, MD .  heparin lock flush 100 unit/mL, 500 Units, Intracatheter, Once PRN, Sindy Guadeloupe, MD .  heparin lock flush 100 unit/mL, 500 Units, Intravenous, Once, Sindy Guadeloupe, MD .  sodium chloride flush (NS) 0.9 % injection 10 mL, 10 mL, Intravenous, PRN, Sindy Guadeloupe, MD, 10 mL at 02/06/18 0845 .  sodium chloride flush (NS) 0.9 % injection 10 mL, 10 mL, Intracatheter, PRN, Sindy Guadeloupe, MD .  sodium chloride flush (NS) 0.9 % injection 10 mL, 10 mL, Intracatheter, PRN, Sindy Guadeloupe, MD, 10 mL at 03/06/18 0900 .  sodium chloride flush (NS) 0.9 % injection 10 mL, 10 mL, Intravenous, PRN, Sindy Guadeloupe, MD, 10 mL at 03/27/18 0909  Physical exam:  Vitals:   05/15/18 0900  BP: (!) 154/66  Pulse: 65  Resp: 18  Temp: 97.9 F (36.6 C)  TempSrc: Tympanic  SpO2: 100%  Weight: 190 lb 7.6  oz (86.4 kg)   Physical Exam  Constitutional: She is oriented to person, place, and time. She appears well-developed and well-nourished.  HENT:  Head: Normocephalic and atraumatic.  Eyes: Pupils are equal, round, and reactive to light. EOM are normal.  Neck: Normal range of motion.  Cardiovascular: Normal rate, regular rhythm and normal heart sounds.  Pulmonary/Chest: Effort normal and breath sounds normal.  Abdominal: Soft. Bowel sounds are normal.  Musculoskeletal: She exhibits edema (trace b/l ankle).  Neurological: She is alert and oriented to person, place, and time.  Skin: Skin is warm and dry.  CMP Latest Ref Rng & Units 05/15/2018  Glucose 65 - 99 mg/dL 106(H)  BUN 6 - 20 mg/dL 9  Creatinine 0.44 - 1.00 mg/dL 0.76  Sodium 135 - 145 mmol/L 136  Potassium 3.5 - 5.1 mmol/L 3.8  Chloride 101 - 111 mmol/L 103  CO2 22 - 32 mmol/L 22  Calcium 8.9 - 10.3 mg/dL 9.1  Total Protein 6.5 - 8.1 g/dL 6.8  Total Bilirubin 0.3 - 1.2 mg/dL 0.7  Alkaline Phos 38 - 126 U/L 90  AST 15 - 41 U/L 23  ALT 14 - 54 U/L 14   CBC Latest Ref Rng & Units 05/15/2018  WBC 3.6 - 11.0 K/uL 6.6  Hemoglobin 12.0 - 16.0 g/dL 12.9  Hematocrit 35.0 - 47.0 % 37.7  Platelets 150 - 440 K/uL 108(L)    No images are attached to the encounter.  Ct Chest W Contrast  Result Date: 04/20/2018 CLINICAL DATA:  Patient with history of colon cancer status post hemicolectomy. EXAM: CT CHEST, ABDOMEN, AND PELVIS WITH CONTRAST TECHNIQUE: Multidetector CT imaging of the chest, abdomen and pelvis was performed following the standard protocol during bolus administration of intravenous contrast. CONTRAST:  178m ISOVUE-300 IOPAMIDOL (ISOVUE-300) INJECTION 61% COMPARISON:  CT CAP 12/03/2017 FINDINGS: CT CHEST FINDINGS Cardiovascular: Right anterior chest wall Port-A-Cath is present with tip terminating in the superior vena cava. Trace fluid superior pericardial recess. Normal heart size. Mediastinum/Nodes: No enlarged  axillary, mediastinal or hilar lymphadenopathy. Esophagus is normal in appearance. Lungs/Pleura: Central airways are patent. Bandlike atelectasis within the left lower lobe and lingula. No large area of pulmonary consolidation. No pleural effusion or pneumothorax. Similar-appearing 3 mm right upper lobe nodule (image 36; series 4). Musculoskeletal: Thoracic spine degenerative changes. No aggressive or acute appearing osseous lesions. CT ABDOMEN PELVIS FINDINGS Hepatobiliary: Liver is normal in size and contour. There is patchy low-attenuation peripherally, predominately within the right hepatic lobe (image 55; series 2). Additionally within the right hepatic lobe there is an 8 mm flash filling lesion (image 52; series 2). Gallbladder is unremarkable. No intrahepatic or extrahepatic biliary ductal dilatation. Pancreas: Unremarkable Spleen: Unremarkable Adrenals/Urinary Tract: Stable 1 cm left adrenal nodule and mild right adrenal thickening. Stable 9 mm right adrenal nodule (image 7; series 2). There is a 2 cm cyst within the interpolar region of the left kidney. There is a 5.1 cm cyst exophytic off the inferior pole of the right kidney. No hydronephrosis. Urinary bladder is unremarkable. 2 mm stone interpolar region left kidney (image 82; series 6). Stomach/Bowel: Large amount of stool within the rectum and distal colon. Patient status post distal ileal resection and right hemicolectomy. There is a 2.9 x 1.7 cm soft tissue mass which appears to be involving the distal small bowel within the right lower quadrant (image 98; series 2). Mild nodularity of the peritoneum within the right pericolic gutter (image 73; series 6). Reference nodule measures 1.0 cm (image 82; series 2). Additional small nodules/lymph nodes are demonstrated within mesentery in the right hemiabdomen (image 88; series 2). Normal morphology of the stomach. No free fluid or free intraperitoneal air. Vascular/Lymphatic: Normal caliber abdominal  aorta. Peripheral calcified atherosclerotic plaque. No retroperitoneal lymphadenopathy. Reproductive: Uterus and left adnexal structures unremarkable. There is small amount of fluid and irregularity involving the right adnexal structures (image 97; series 2). Other: Small bowel containing right anterior abdominal wall hernia. Musculoskeletal: Lumbar spine degenerative changes. No aggressive or acute appearing osseous lesions. IMPRESSION: 1. Patient status post interval distal ileal resection and ascending colectomy for  removal of a large mass. There is peritoneal nodularity within the right pericolic gutter which may represent postsurgical change and/or residual disease. 2. There is a soft tissue mass which appears to be involving the distal small bowel within the right lower quadrant which may represent residual/recurrent disease. 3. There is soft tissue irregularity and fluid within the right adnexa which may represent postsurgical changes or residual/recurrent disease. 4. Large amount of stool within the rectum as can be seen with constipation. 5. Patchy peripheral low attenuation throughout the liver which may represent interval steatosis. Recommend attention on follow-up. Additionally, this can be further evaluated with MRI as clinically indicated. 6. Indeterminate flash filling lesion within the right hepatic lobe, potentially flash filling hemangioma. Recommend attention on follow-up. 7. Stable bilateral adrenal nodules. Recommend attention on follow-up. Electronically Signed   By: Lovey Newcomer M.D.   On: 04/20/2018 15:33   Ct Abdomen Pelvis W Contrast  Result Date: 04/20/2018 CLINICAL DATA:  Patient with history of colon cancer status post hemicolectomy. EXAM: CT CHEST, ABDOMEN, AND PELVIS WITH CONTRAST TECHNIQUE: Multidetector CT imaging of the chest, abdomen and pelvis was performed following the standard protocol during bolus administration of intravenous contrast. CONTRAST:  157m ISOVUE-300 IOPAMIDOL  (ISOVUE-300) INJECTION 61% COMPARISON:  CT CAP 12/03/2017 FINDINGS: CT CHEST FINDINGS Cardiovascular: Right anterior chest wall Port-A-Cath is present with tip terminating in the superior vena cava. Trace fluid superior pericardial recess. Normal heart size. Mediastinum/Nodes: No enlarged axillary, mediastinal or hilar lymphadenopathy. Esophagus is normal in appearance. Lungs/Pleura: Central airways are patent. Bandlike atelectasis within the left lower lobe and lingula. No large area of pulmonary consolidation. No pleural effusion or pneumothorax. Similar-appearing 3 mm right upper lobe nodule (image 36; series 4). Musculoskeletal: Thoracic spine degenerative changes. No aggressive or acute appearing osseous lesions. CT ABDOMEN PELVIS FINDINGS Hepatobiliary: Liver is normal in size and contour. There is patchy low-attenuation peripherally, predominately within the right hepatic lobe (image 55; series 2). Additionally within the right hepatic lobe there is an 8 mm flash filling lesion (image 52; series 2). Gallbladder is unremarkable. No intrahepatic or extrahepatic biliary ductal dilatation. Pancreas: Unremarkable Spleen: Unremarkable Adrenals/Urinary Tract: Stable 1 cm left adrenal nodule and mild right adrenal thickening. Stable 9 mm right adrenal nodule (image 7; series 2). There is a 2 cm cyst within the interpolar region of the left kidney. There is a 5.1 cm cyst exophytic off the inferior pole of the right kidney. No hydronephrosis. Urinary bladder is unremarkable. 2 mm stone interpolar region left kidney (image 82; series 6). Stomach/Bowel: Large amount of stool within the rectum and distal colon. Patient status post distal ileal resection and right hemicolectomy. There is a 2.9 x 1.7 cm soft tissue mass which appears to be involving the distal small bowel within the right lower quadrant (image 98; series 2). Mild nodularity of the peritoneum within the right pericolic gutter (image 73; series 6). Reference  nodule measures 1.0 cm (image 82; series 2). Additional small nodules/lymph nodes are demonstrated within mesentery in the right hemiabdomen (image 88; series 2). Normal morphology of the stomach. No free fluid or free intraperitoneal air. Vascular/Lymphatic: Normal caliber abdominal aorta. Peripheral calcified atherosclerotic plaque. No retroperitoneal lymphadenopathy. Reproductive: Uterus and left adnexal structures unremarkable. There is small amount of fluid and irregularity involving the right adnexal structures (image 97; series 2). Other: Small bowel containing right anterior abdominal wall hernia. Musculoskeletal: Lumbar spine degenerative changes. No aggressive or acute appearing osseous lesions. IMPRESSION: 1. Patient status post interval distal ileal  resection and ascending colectomy for removal of a large mass. There is peritoneal nodularity within the right pericolic gutter which may represent postsurgical change and/or residual disease. 2. There is a soft tissue mass which appears to be involving the distal small bowel within the right lower quadrant which may represent residual/recurrent disease. 3. There is soft tissue irregularity and fluid within the right adnexa which may represent postsurgical changes or residual/recurrent disease. 4. Large amount of stool within the rectum as can be seen with constipation. 5. Patchy peripheral low attenuation throughout the liver which may represent interval steatosis. Recommend attention on follow-up. Additionally, this can be further evaluated with MRI as clinically indicated. 6. Indeterminate flash filling lesion within the right hepatic lobe, potentially flash filling hemangioma. Recommend attention on follow-up. 7. Stable bilateral adrenal nodules. Recommend attention on follow-up. Electronically Signed   By: Lovey Newcomer M.D.   On: 04/20/2018 15:33     Assessment and plan- Patient is a 72 y.o. female at least stage IIIC pT4b pN2aMxstatus post  hemicolectomy (probably stage IV given peritoneal nodularity) here for on treatment assessment prior to cycle 7 of FOLFOX/ avastin chemotherapy  Counts ok to proceed with cycle 7 of 5FU and avastin today. oxaliplatin has been discontinued due to worsening thrombocytopenia. I will however add the bolus 5FU back. Urin protein +1. Continue avastin. Continue to monitor BP which has been in 140's-150's. Will touch base with her pcp to adjust her bp meds given that she is on avastin.  I will see her back in 2 weeks time with cbc/cmp for cycle 8 of 5FU/ avastin. She gets neulasta on day 3.      Visit Diagnosis 1. Colon adenocarcinoma ([place])   2. Encounter for antineoplastic chemotherapy   3. Chemotherapy-induced thrombocytopenia   4. Encounter for monoclonal antibody treatment for malignancy      Dr. Randa Evens, MD, MPH [facility] [phone] 05/15/2018 11:49 AM

## 2018-05-15 NOTE — Progress Notes (Signed)
No new changes noted today 

## 2018-05-15 NOTE — Telephone Encounter (Signed)
Per Dr. Janese Banks / I [facility] and spoke with Vicente Males to inform her PCP that the patient b/p has been running high with coming to the office and would like for her to be followed a little closely for monitoring.I also informed her that with the new medication that she is going to start (avastatin) it will also increase her blood pressure. Ms Vicente Males was agreeable and understanding.

## 2018-05-17 ENCOUNTER — Inpatient Hospital Stay: Payer: Medicare Other

## 2018-05-17 DIAGNOSIS — Z5111 Encounter for antineoplastic chemotherapy: Secondary | ICD-10-CM | POA: Diagnosis not present

## 2018-05-17 DIAGNOSIS — C189 Malignant neoplasm of colon, unspecified: Secondary | ICD-10-CM

## 2018-05-17 MED ORDER — HEPARIN SOD (PORK) LOCK FLUSH 100 UNIT/ML IV SOLN
500.0000 [IU] | Freq: Once | INTRAVENOUS | Status: AC | PRN
  Administered 2018-05-17: 500 [IU]

## 2018-05-17 MED ORDER — PEGFILGRASTIM INJECTION 6 MG/0.6ML ~~LOC~~
6.0000 mg | PREFILLED_SYRINGE | Freq: Once | SUBCUTANEOUS | Status: AC
  Administered 2018-05-17: 6 mg via SUBCUTANEOUS

## 2018-05-17 MED ORDER — SODIUM CHLORIDE 0.9% FLUSH
10.0000 mL | INTRAVENOUS | Status: DC | PRN
  Administered 2018-05-17: 10 mL
  Filled 2018-05-17: qty 10

## 2018-05-17 NOTE — Progress Notes (Signed)
Caregiver mentioned patient has some red spots   on her neck, pt denies itching or pain. There is 1 raised area or small nodule that is flesh colored on the back of her neck as well as the small rash area. Patient states that raised bump has been there a long time.

## 2018-05-29 ENCOUNTER — Inpatient Hospital Stay: Payer: Medicare Other | Attending: Oncology | Admitting: Oncology

## 2018-05-29 ENCOUNTER — Encounter: Payer: Self-pay | Admitting: Oncology

## 2018-05-29 ENCOUNTER — Inpatient Hospital Stay: Payer: Medicare Other

## 2018-05-29 VITALS — BP 193/76 | HR 55 | Temp 97.3°F | Resp 18 | Ht 65.0 in | Wt 189.5 lb

## 2018-05-29 DIAGNOSIS — C182 Malignant neoplasm of ascending colon: Secondary | ICD-10-CM | POA: Diagnosis not present

## 2018-05-29 DIAGNOSIS — Z5111 Encounter for antineoplastic chemotherapy: Secondary | ICD-10-CM | POA: Diagnosis present

## 2018-05-29 DIAGNOSIS — R030 Elevated blood-pressure reading, without diagnosis of hypertension: Secondary | ICD-10-CM | POA: Diagnosis not present

## 2018-05-29 DIAGNOSIS — D696 Thrombocytopenia, unspecified: Secondary | ICD-10-CM | POA: Insufficient documentation

## 2018-05-29 DIAGNOSIS — I160 Hypertensive urgency: Secondary | ICD-10-CM | POA: Diagnosis not present

## 2018-05-29 DIAGNOSIS — C189 Malignant neoplasm of colon, unspecified: Secondary | ICD-10-CM

## 2018-05-29 DIAGNOSIS — Z79899 Other long term (current) drug therapy: Secondary | ICD-10-CM | POA: Diagnosis not present

## 2018-05-29 LAB — COMPREHENSIVE METABOLIC PANEL
ALBUMIN: 3.8 g/dL (ref 3.5–5.0)
ALK PHOS: 85 U/L (ref 38–126)
ALT: 14 U/L (ref 0–44)
ANION GAP: 11 (ref 5–15)
AST: 21 U/L (ref 15–41)
BUN: 8 mg/dL (ref 8–23)
CALCIUM: 9.1 mg/dL (ref 8.9–10.3)
CHLORIDE: 101 mmol/L (ref 98–111)
CO2: 23 mmol/L (ref 22–32)
Creatinine, Ser: 0.66 mg/dL (ref 0.44–1.00)
GFR calc non Af Amer: >60 mL/min (ref >60)
Glucose, Bld: 102 mg/dL — ABNORMAL HIGH (ref 70–99)
POTASSIUM: 4.1 mmol/L (ref 3.5–5.1)
SODIUM: 135 mmol/L (ref 135–145)
Total Bilirubin: 0.6 mg/dL (ref 0.3–1.2)
Total Protein: 6.7 g/dL (ref 6.5–8.1)

## 2018-05-29 LAB — CBC WITH DIFFERENTIAL/PLATELET
BASOS PCT: 1 %
Basophils Absolute: 0.1 10*3/uL (ref 0–0.1)
Eosinophils Absolute: 0 10*3/uL (ref 0–0.7)
Eosinophils Relative: 1 %
HEMATOCRIT: 36.6 % (ref 35.0–47.0)
HEMOGLOBIN: 12.5 g/dL (ref 12.0–16.0)
LYMPHS PCT: 29 %
Lymphs Abs: 1.6 10*3/uL (ref 1.0–3.6)
MCH: 34 pg (ref 26.0–34.0)
MCHC: 34.1 g/dL (ref 32.0–36.0)
MCV: 99.6 fL (ref 80.0–100.0)
MONOS PCT: 10 %
Monocytes Absolute: 0.6 10*3/uL (ref 0.2–0.9)
NEUTROS ABS: 3.1 10*3/uL (ref 1.4–6.5)
NEUTROS PCT: 59 %
Platelets: 120 10*3/uL — ABNORMAL LOW (ref 150–440)
RBC: 3.67 MIL/uL — ABNORMAL LOW (ref 3.80–5.20)
RDW: 16.8 % — ABNORMAL HIGH (ref 11.5–14.5)
WBC: 5.3 10*3/uL (ref 3.6–11.0)

## 2018-05-29 MED ORDER — HEPARIN SOD (PORK) LOCK FLUSH 100 UNIT/ML IV SOLN
500.0000 [IU] | Freq: Once | INTRAVENOUS | Status: AC
  Administered 2018-05-29: 500 [IU] via INTRAVENOUS

## 2018-05-29 MED ORDER — SODIUM CHLORIDE 0.9% FLUSH
10.0000 mL | INTRAVENOUS | Status: DC | PRN
  Administered 2018-05-29: 10 mL via INTRAVENOUS
  Filled 2018-05-29: qty 10

## 2018-05-29 NOTE — Progress Notes (Signed)
No new changes noted today 

## 2018-05-29 NOTE — Progress Notes (Signed)
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 She presented to the ER with symptoms of dizziness mostly when she stands up. She has had problems with constipation in the past and takes prune juice to relieve it. Patient is a poor historian at baseline and complained of abdominal pain in ER which led to CT abdomen. 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  CT chest without contrast showed tiny 2 mm right upper lobe pulmonary nodule. No other evidence of metastatic disease. Baseline CEA elevated at 4.8  Patient underwent exploratory laparotomy with right hemicolectomy and terminal ileum resection as well as separate small bowel resection on 12/06/2017. Operative findings showed a large cecal tumor with extramural spread to retroperitoneum mesentery, mesentery lymph nodes and parietal peritoneum. Dense inflammatory response of the retroperitoneum and small bowel.There was a second portion of small bowel that was involved and was resected. A small portion of free tumor was identified in the retroperitoneum which was sent off for examination as well.  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 Radial margin positive for invasive carcinoma 2 colon adenomas 2.5 and 1.8 cm of the  ascending colon. Metastatic adenocarcinoma in 4 out of 13 lymph nodes. 2 tumor deposits Segment of small intestine with abscess which also showed adenocarcinoma invading the distal appendix with perforation. One mesenteric lymph node negative for malignancy. Retroperitoneal tumor removal was also positive for mucinous adenocarcinoma. Tumor was grade 2. Proximal and distal margins were negative.pT4bpN2a. MSI stable. KRAS mutation positive  Case discussed at tumor board. PET CT will not be done at this time as it may represent post surgical changes. Radiation oncology does not recommend adjuvant RT upon completion of chemotherapy.she was found to have recurrent small bowel soft tissue mass and peritoneal nodularity after 5 cycles of folfox. FOLFOX avastin to be given with a palliative intent. oxaliplatin discontinued after 5 cycles due to progressive thrombocytopenia requiring treatment interruptions   Interval history- she feels well today. Denies any chest pain, sob or headaches.   ECOG PS- 1 Pain scale- 0 Opioid associated constipation- no  Review of systems- Review of Systems  Constitutional: Negative for chills, fever, malaise/fatigue and weight loss.  HENT: Negative for congestion, ear discharge and nosebleeds.   Eyes: Negative for blurred vision.  Respiratory: Negative for cough, hemoptysis, sputum production, shortness of breath and wheezing.   Cardiovascular: Negative for chest pain, palpitations, orthopnea and claudication.  Gastrointestinal: Negative for abdominal pain, blood in stool, constipation, diarrhea, heartburn, melena, nausea and vomiting.  Genitourinary: Negative for dysuria, flank pain, frequency, hematuria and urgency.  Musculoskeletal: Negative for back pain, joint pain and myalgias.  Skin: Negative for rash.  Neurological: Negative for dizziness, tingling, focal weakness, seizures, weakness and headaches.  Endo/Heme/Allergies: Does not bruise/bleed easily.    Psychiatric/Behavioral: Negative for depression and suicidal ideas. The patient does  not have insomnia.       No Known Allergies   Past Medical History:  Diagnosis Date  . Cancer [facility])      Past Surgical History:  Procedure Laterality Date  . COLOSTOMY REVISION N/A 12/06/2017   Procedure: COLON RESECTION RIGHT;  Surgeon: Florene Glen, MD;  Location: [facility] ORS;  Service: General;  Laterality: N/A;  . DILATION AND CURETTAGE, DIAGNOSTIC / THERAPEUTIC    . PORTACATH PLACEMENT N/A 01/06/2018   Procedure: INSERTION PORT-A-CATH;  Surgeon: Vickie Epley, MD;  Location: [facility] ORS;  Service: General;  Laterality: N/A;    Social History   Socioeconomic History  . Marital status: Widowed    Spouse name: Not on file  . Number of children: Not on file  . Years of education: Not on file  . Highest education level: Not on file  Occupational History  . Not on file  Social Needs  . Financial resource strain: Patient refused  . Food insecurity:    Worry: Patient refused    Inability: Patient refused  . Transportation needs:    Medical: Patient refused    Non-medical: Patient refused  Tobacco Use  . Smoking status: Former Research scientist (life sciences)  . Smokeless tobacco: Never Used  Substance and Sexual Activity  . Alcohol use: No  . Drug use: No  . Sexual activity: Never  Lifestyle  . Physical activity:    Days per week: Patient refused    Minutes per session: Patient refused  . Stress: Patient refused  Relationships  . Social connections:    Talks on phone: Patient refused    Gets together: Patient refused    Attends religious service: Patient refused    Active member of club or organization: Patient refused    Attends meetings of clubs or organizations: Patient refused    Relationship status: Patient refused  . Intimate partner violence:    Fear of current or ex partner: Patient refused    Emotionally abused: Patient refused    Physically abused: Patient refused    Forced sexual  activity: Patient refused  Other Topics Concern  . Not on file  Social History Narrative  . Not on file    Family History  Problem Relation Age of Onset  . ALS Mother   . ALS Father      Current Outpatient Medications:  .  acetaminophen (TYLENOL) 500 MG tablet, Take 1,000 mg by mouth every 8 (eight) hours as needed for mild pain., Disp: , Rfl:  .  alendronate (FOSAMAX) 70 MG tablet, Take 70 mg by mouth once a week., Disp: , Rfl:  .  ARIPiprazole (ABILIFY) 5 MG tablet, Take 5 mg by mouth at bedtime. , Disp: , Rfl:  .  benztropine (COGENTIN) 0.5 MG tablet, Take 0.5 mg by mouth at bedtime., Disp: , Rfl:  .  Calcium 600-200 MG-UNIT tablet, Take 1 tablet by mouth daily., Disp: , Rfl:  .  calcium carbonate (TUMS - DOSED IN MG ELEMENTAL CALCIUM) 500 MG chewable tablet, Chew 1 tablet by mouth 2 (two) times daily as needed for indigestion or heartburn., Disp: , Rfl:  .  dexamethasone (DECADRON) 4 MG tablet, Take 1 tablet (4 mg total) by mouth 2 (two) times daily with a meal. Starting the day after chemotherapy for 2 days (Patient not taking: Reported on 04/24/2018), Disp: 30 tablet, Rfl: 0 .  divalproex (DEPAKOTE) 500 MG DR tablet, Take 500 mg by mouth at bedtime. , Disp: , Rfl:  .  lidocaine-prilocaine (EMLA) cream,  Apply 1 application topically as needed. 1 hour prior to coming for each chemotherapy treatment. Place small amount over port site and place saran wrap over the cream to protect the clothing (Patient not taking: Reported on 04/24/2018), Disp: 30 g, Rfl: 0 .  loratadine (CLARITIN) 10 MG tablet, Take 1 tablet (10 mg total) by mouth daily as needed for allergies. Take 1 tablet the day before pump removal, 1 tablet the day of pump removal and 1 tablet the day after pump removal. Take as directed with each cycle of  chemotherapy treatment, Disp: 30 tablet, Rfl: 1 .  memantine (NAMENDA XR) 14 MG CP24 24 hr capsule, Take 14 mg by mouth at bedtime. , Disp: , Rfl:  .  metoprolol succinate  (TOPROL-XL) 50 MG 24 hr tablet, Take 50 mg by mouth every morning. Take with or immediately following a meal. , Disp: , Rfl:  .  Multiple Vitamin (MULTIVITAMIN WITH MINERALS) TABS tablet, Take 1 tablet by mouth daily. (Patient not taking: Reported on 05/15/2018), Disp: 30 tablet, Rfl: 0 .  ondansetron (ZOFRAN) 8 MG tablet, Take 1 tablet (8 mg total) by mouth 2 (two) times daily as needed for nausea or vomiting. Can start using on day 3 after chemo (Patient not taking: Reported on 04/24/2018), Disp: 20 tablet, Rfl: 0 .  perphenazine (TRILAFON) 4 MG tablet, Take 4 mg by mouth 2 (two) times daily., Disp: , Rfl:  .  prochlorperazine (COMPAZINE) 10 MG tablet, Take 1 tablet (10 mg total) by mouth every 6 (six) hours as needed for nausea or vomiting. (Patient not taking: Reported on 04/24/2018), Disp: 30 tablet, Rfl: 0 No current facility-administered medications for this visit.   Facility-Administered Medications Ordered in Other Visits:  .  dextrose 5 % solution, , Intravenous, Once, Randa Evens C, MD .  heparin lock flush 100 unit/mL, 500 Units, Intravenous, Once, Sindy Guadeloupe, MD .  heparin lock flush 100 unit/mL, 500 Units, Intracatheter, Once PRN, Sindy Guadeloupe, MD .  heparin lock flush 100 unit/mL, 500 Units, Intravenous, Once, Sindy Guadeloupe, MD .  sodium chloride flush (NS) 0.9 % injection 10 mL, 10 mL, Intravenous, PRN, Sindy Guadeloupe, MD, 10 mL at 02/06/18 0845 .  sodium chloride flush (NS) 0.9 % injection 10 mL, 10 mL, Intracatheter, PRN, Sindy Guadeloupe, MD .  sodium chloride flush (NS) 0.9 % injection 10 mL, 10 mL, Intracatheter, PRN, Sindy Guadeloupe, MD, 10 mL at 03/06/18 0900 .  sodium chloride flush (NS) 0.9 % injection 10 mL, 10 mL, Intravenous, PRN, Sindy Guadeloupe, MD, 10 mL at 03/27/18 0909  Physical exam:  Vitals:   05/29/18 0907  BP: (!) 193/76  Pulse: (!) 55  Resp: 18  Temp: (!) 97.3 F (36.3 C)  TempSrc: Tympanic  SpO2: 99%  Weight: 189 lb 7.8 oz (86 kg)  Height: '5\' 5"'   (1.651 m)   Physical Exam  Constitutional: She is oriented to person, place, and time. She appears well-developed and well-nourished.  HENT:  Head: Normocephalic and atraumatic.  Eyes: Pupils are equal, round, and reactive to light. EOM are normal.  Neck: Normal range of motion.  Cardiovascular: Normal rate, regular rhythm and normal heart sounds.  Pulmonary/Chest: Effort normal and breath sounds normal.  Abdominal: Soft. Bowel sounds are normal.  Musculoskeletal: She exhibits edema (trace b/l ankle edema).  Neurological: She is alert and oriented to person, place, and time.  Skin: Skin is warm and dry.     CMP Latest Ref Rng &  Units 05/29/2018  Glucose 70 - 99 mg/dL 102(H)  BUN 8 - 23 mg/dL 8  Creatinine 0.44 - 1.00 mg/dL 0.66  Sodium 135 - 145 mmol/L 135  Potassium 3.5 - 5.1 mmol/L 4.1  Chloride 98 - 111 mmol/L 101  CO2 22 - 32 mmol/L 23  Calcium 8.9 - 10.3 mg/dL 9.1  Total Protein 6.5 - 8.1 g/dL 6.7  Total Bilirubin 0.3 - 1.2 mg/dL 0.6  Alkaline Phos 38 - 126 U/L 85  AST 15 - 41 U/L 21  ALT 0 - 44 U/L 14   CBC Latest Ref Rng & Units 05/15/2018  WBC 3.6 - 11.0 K/uL 6.6  Hemoglobin 12.0 - 16.0 g/dL 12.9  Hematocrit 35.0 - 47.0 % 37.7  Platelets 150 - 440 K/uL 108(L)      Assessment and plan- Patient is a 72 y.o. female at least stage IIIC pT4b pN2aMxstatus post hemicolectomy (probably stage IV given peritoneal nodularity) here for on treatment assessment prior to cycle 8 of 5FU/ avastin  Her counts are ok to proceed with treatment today. However, her BP is uncontrolled and on repeat check it was 189/82. I will therefore be holding chemo today. I have spoken to her caregiver and she has agreed to take the patient to her pcp today. She is only on metoprolol for her hypertension which has been worse since starting avastin.  If systolic BP is not less than 140-150, I will not be able to offer her avasstin. Her BP needs to be managed closely.  I will see her back in 1 weeks  time. No labs. She gets 5FU +/- avastin.    Visit Diagnosis 1. Malignant neoplasm of ascending colon ([place])   2. Hypertensive urgency      Dr. Randa Evens, MD, MPH [facility] [phone] 05/29/2018 10:16 AM

## 2018-05-29 NOTE — Progress Notes (Signed)
Patient's blood pressure elevated this morning to 186/89 in the infusion suite.  Patient unable to provide clean sample for urine protein lab.  Dr. Janese Banks spoke with caregiver to make sure patient is seen by primary.  Caregiver verbalized understanding.  Patient not treated today due to elevated blood pressure.

## 2018-06-05 ENCOUNTER — Encounter: Payer: Self-pay | Admitting: Oncology

## 2018-06-05 ENCOUNTER — Inpatient Hospital Stay (HOSPITAL_BASED_OUTPATIENT_CLINIC_OR_DEPARTMENT_OTHER): Payer: Medicare Other | Admitting: Oncology

## 2018-06-05 ENCOUNTER — Inpatient Hospital Stay: Payer: Medicare Other

## 2018-06-05 VITALS — BP 145/72 | Resp 18

## 2018-06-05 VITALS — BP 150/67 | HR 56 | Temp 98.3°F | Resp 18 | Ht 65.0 in | Wt 185.4 lb

## 2018-06-05 DIAGNOSIS — C189 Malignant neoplasm of colon, unspecified: Secondary | ICD-10-CM

## 2018-06-05 DIAGNOSIS — Z79899 Other long term (current) drug therapy: Secondary | ICD-10-CM

## 2018-06-05 DIAGNOSIS — D6959 Other secondary thrombocytopenia: Secondary | ICD-10-CM

## 2018-06-05 DIAGNOSIS — R030 Elevated blood-pressure reading, without diagnosis of hypertension: Secondary | ICD-10-CM | POA: Diagnosis not present

## 2018-06-05 DIAGNOSIS — Z5111 Encounter for antineoplastic chemotherapy: Secondary | ICD-10-CM | POA: Diagnosis not present

## 2018-06-05 DIAGNOSIS — I160 Hypertensive urgency: Secondary | ICD-10-CM

## 2018-06-05 DIAGNOSIS — T451X5A Adverse effect of antineoplastic and immunosuppressive drugs, initial encounter: Secondary | ICD-10-CM

## 2018-06-05 DIAGNOSIS — C182 Malignant neoplasm of ascending colon: Secondary | ICD-10-CM | POA: Diagnosis not present

## 2018-06-05 DIAGNOSIS — D696 Thrombocytopenia, unspecified: Secondary | ICD-10-CM | POA: Diagnosis not present

## 2018-06-05 MED ORDER — DEXAMETHASONE SODIUM PHOSPHATE 10 MG/ML IJ SOLN
10.0000 mg | Freq: Once | INTRAMUSCULAR | Status: AC
  Administered 2018-06-05: 10 mg via INTRAVENOUS
  Filled 2018-06-05: qty 1

## 2018-06-05 MED ORDER — DEXAMETHASONE SODIUM PHOSPHATE 10 MG/ML IJ SOLN: 10.0000 mg | Freq: Once | INTRAMUSCULAR | Status: AC

## 2018-06-05 MED ORDER — FLUOROURACIL CHEMO INJECTION 2.5 GM/50ML
400.0000 mg/m2 | Freq: Once | INTRAVENOUS | Status: AC
  Administered 2018-06-05: 800 mg via INTRAVENOUS
  Filled 2018-06-05: qty 16

## 2018-06-05 MED ORDER — SODIUM CHLORIDE 0.9 % IV SOLN: 10.0000 mg | Freq: Once | INTRAVENOUS | Status: DC

## 2018-06-05 MED ORDER — FLUOROURACIL CHEMO INJECTION 5 GM/100ML
2400.0000 mg/m2 | INTRAVENOUS | Status: DC
  Administered 2018-06-05: 4800 mg via INTRAVENOUS
  Filled 2018-06-05: qty 96

## 2018-06-05 MED ORDER — LEUCOVORIN CALCIUM INJECTION 100 MG
20.0000 mg/m2 | Freq: Once | INTRAMUSCULAR | Status: AC
  Administered 2018-06-05: 40 mg via INTRAVENOUS
  Filled 2018-06-05: qty 2

## 2018-06-05 MED ORDER — SODIUM CHLORIDE 0.9% FLUSH
10.0000 mL | INTRAVENOUS | Status: DC | PRN
  Administered 2018-06-05: 10 mL
  Filled 2018-06-05: qty 10

## 2018-06-05 MED ORDER — DEXTROSE 5 % IV SOLN
Freq: Once | INTRAVENOUS | Status: AC
  Administered 2018-06-05: 10:00:00 via INTRAVENOUS
  Filled 2018-06-05: qty 1000

## 2018-06-05 MED ORDER — HEPARIN SOD (PORK) LOCK FLUSH 100 UNIT/ML IV SOLN: 500.0000 [IU] | Freq: Once | INTRAVENOUS | Status: DC | PRN

## 2018-06-05 MED ORDER — PALONOSETRON HCL INJECTION 0.25 MG/5ML
0.2500 mg | Freq: Once | INTRAVENOUS | Status: AC
  Administered 2018-06-05: 0.25 mg via INTRAVENOUS
  Filled 2018-06-05: qty 5

## 2018-06-05 MED ORDER — LEUCOVORIN CALCIUM INJECTION 350 MG: 800.0000 mg | Freq: Once | INTRAVENOUS | Status: DC

## 2018-06-05 NOTE — Patient Instructions (Signed)
Fluorouracil, 5-FU injection What is this medicine? FLUOROURACIL, 5-FU (flure oh YOOR a sil) is a chemotherapy drug. It slows the growth of cancer cells. This medicine is used to treat many types of cancer like breast cancer, colon or rectal cancer, pancreatic cancer, and stomach cancer. This medicine may be used for other purposes; ask your health care provider or pharmacist if you have questions. COMMON BRAND NAME(S): Adrucil What should I tell my health care provider before I take this medicine? They need to know if you have any of these conditions: -blood disorders -dihydropyrimidine dehydrogenase (DPD) deficiency -infection (especially a virus infection such as chickenpox, cold sores, or herpes) -kidney disease -liver disease -malnourished, poor nutrition -recent or ongoing radiation therapy -an unusual or allergic reaction to fluorouracil, other chemotherapy, other medicines, foods, dyes, or preservatives -pregnant or trying to get pregnant -breast-feeding How should I use this medicine? This drug is given as an infusion or injection into a vein. It is administered in a hospital or clinic by a specially trained health care professional. Talk to your pediatrician regarding the use of this medicine in children. Special care may be needed. Overdosage: If you think you have taken too much of this medicine contact a poison control center or emergency room at once. NOTE: This medicine is only for you. Do not share this medicine with others. What if I miss a dose? It is important not to miss your dose. Call your doctor or health care professional if you are unable to keep an appointment. What may interact with this medicine? -allopurinol -cimetidine -dapsone -digoxin -hydroxyurea -leucovorin -levamisole -medicines for seizures like ethotoin, fosphenytoin, phenytoin -medicines to increase blood counts like filgrastim, pegfilgrastim, sargramostim -medicines that treat or prevent blood  clots like warfarin, enoxaparin, and dalteparin -methotrexate -metronidazole -pyrimethamine -some other chemotherapy drugs like busulfan, cisplatin, estramustine, vinblastine -trimethoprim -trimetrexate -vaccines Talk to your doctor or health care professional before taking any of these medicines: -acetaminophen -aspirin -ibuprofen -ketoprofen -naproxen This list may not describe all possible interactions. Give your health care provider a list of all the medicines, herbs, non-prescription drugs, or dietary supplements you use. Also tell them if you smoke, drink alcohol, or use illegal drugs. Some items may interact with your medicine. What should I watch for while using this medicine? Visit your doctor for checks on your progress. This drug may make you feel generally unwell. This is not uncommon, as chemotherapy can affect healthy cells as well as cancer cells. Report any side effects. Continue your course of treatment even though you feel ill unless your doctor tells you to stop. In some cases, you may be given additional medicines to help with side effects. Follow all directions for their use. Call your doctor or health care professional for advice if you get a fever, chills or sore throat, or other symptoms of a cold or flu. Do not treat yourself. This drug decreases your body's ability to fight infections. Try to avoid being around people who are sick. This medicine may increase your risk to bruise or bleed. Call your doctor or health care professional if you notice any unusual bleeding. Be careful brushing and flossing your teeth or using a toothpick because you may get an infection or bleed more easily. If you have any dental work done, tell your dentist you are receiving this medicine. Avoid taking products that contain aspirin, acetaminophen, ibuprofen, naproxen, or ketoprofen unless instructed by your doctor. These medicines may hide a fever. Do not become pregnant while taking this  medicine. Women should inform their doctor if they wish to become pregnant or think they might be pregnant. There is a potential for serious side effects to an unborn child. Talk to your health care professional or pharmacist for more information. Do not breast-feed an infant while taking this medicine. Men should inform their doctor if they wish to father a child. This medicine may lower sperm counts. Do not treat diarrhea with over the counter products. Contact your doctor if you have diarrhea that lasts more than 2 days or if it is severe and watery. This medicine can make you more sensitive to the sun. Keep out of the sun. If you cannot avoid being in the sun, wear protective clothing and use sunscreen. Do not use sun lamps or tanning beds/booths. What side effects may I notice from receiving this medicine? Side effects that you should report to your doctor or health care professional as soon as possible: -allergic reactions like skin rash, itching or hives, swelling of the face, lips, or tongue -low blood counts - this medicine may decrease the number of white blood cells, red blood cells and platelets. You may be at increased risk for infections and bleeding. -signs of infection - fever or chills, cough, sore throat, pain or difficulty passing urine -signs of decreased platelets or bleeding - bruising, pinpoint red spots on the skin, black, tarry stools, blood in the urine -signs of decreased red blood cells - unusually weak or tired, fainting spells, lightheadedness -breathing problems -changes in vision -chest pain -mouth sores -nausea and vomiting -pain, swelling, redness at site where injected -pain, tingling, numbness in the hands or feet -redness, swelling, or sores on hands or feet -stomach pain -unusual bleeding Side effects that usually do not require medical attention (report to your doctor or health care professional if they continue or are bothersome): -changes in finger or  toe nails -diarrhea -dry or itchy skin -hair loss -headache -loss of appetite -sensitivity of eyes to the light -stomach upset -unusually teary eyes This list may not describe all possible side effects. Call your doctor for medical advice about side effects. You may report side effects to FDA at 1-800-FDA-1088. Where should I keep my medicine? This drug is given in a hospital or clinic and will not be stored at home. NOTE: This sheet is a summary. It may not cover all possible information. If you have questions about this medicine, talk to your doctor, pharmacist, or health care provider.  2018 Elsevier/Gold Standard (2008-03-13 13:53:16) Leucovorin injection What is this medicine? LEUCOVORIN (loo koe VOR in) is used to prevent or treat the harmful effects of some medicines. This medicine is used to treat anemia caused by a low amount of folic acid in the body. It is also used with 5-fluorouracil (5-FU) to treat colon cancer. This medicine may be used for other purposes; ask your health care provider or pharmacist if you have questions. What should I tell my health care provider before I take this medicine? They need to know if you have any of these conditions: -anemia from low levels of vitamin B-12 in the blood -an unusual or allergic reaction to leucovorin, folic acid, other medicines, foods, dyes, or preservatives -pregnant or trying to get pregnant -breast-feeding How should I use this medicine? This medicine is for injection into a muscle or into a vein. It is given by a health care professional in a hospital or clinic setting. Talk to your pediatrician regarding the use of this medicine in   children. Special care may be needed. Overdosage: If you think you have taken too much of this medicine contact a poison control center or emergency room at once. NOTE: This medicine is only for you. Do not share this medicine with others. What if I miss a dose? This does not apply. What may  interact with this medicine? -capecitabine -fluorouracil -phenobarbital -phenytoin -primidone -trimethoprim-sulfamethoxazole This list may not describe all possible interactions. Give your health care provider a list of all the medicines, herbs, non-prescription drugs, or dietary supplements you use. Also tell them if you smoke, drink alcohol, or use illegal drugs. Some items may interact with your medicine. What should I watch for while using this medicine? Your condition will be monitored carefully while you are receiving this medicine. This medicine may increase the side effects of 5-fluorouracil, 5-FU. Tell your doctor or health care professional if you have diarrhea or mouth sores that do not get better or that get worse. What side effects may I notice from receiving this medicine? Side effects that you should report to your doctor or health care professional as soon as possible: -allergic reactions like skin rash, itching or hives, swelling of the face, lips, or tongue -breathing problems -fever, infection -mouth sores -unusual bleeding or bruising -unusually weak or tired Side effects that usually do not require medical attention (report to your doctor or health care professional if they continue or are bothersome): -constipation or diarrhea -loss of appetite -nausea, vomiting This list may not describe all possible side effects. Call your doctor for medical advice about side effects. You may report side effects to FDA at 1-800-FDA-1088. Where should I keep my medicine? This drug is given in a hospital or clinic and will not be stored at home. NOTE: This sheet is a summary. It may not cover all possible information. If you have questions about this medicine, talk to your doctor, pharmacist, or health care provider.  2018 Elsevier/Gold Standard (2008-05-14 16:50:29)

## 2018-06-05 NOTE — Progress Notes (Signed)
Hematology/Oncology Consult note [facility]  [phone] [phone]  Patient Care Team: Lin Givens, MD as PCP - General (Family Medicine) Clent Jacks, RN as Registered Nurse   Name of the patient: Latoya Jones  [id number]  25-Jul-1946   Date of visit: 06/05/18  Diagnosis- adenocarcinoma of the colon at least stage IIIC pT4b pN2aMxstatus post hemicolectomy (possibly stage IV given peritoneal nodularity seen on CT)   Chief complaint/ Reason for visit- on treatment assessment prior to cycle 8 of 5FU/ avastin chemotherapy  Heme/Onc history: Patient is a 72 year old female with a h/o developmental and mood disoredr who resides at a group home. She presented to the ER with symptoms of dizziness mostly when she stands up. She has had problems with constipation in the past and takes prune juice to relieve it. Patient is a poor historian at baseline and complained of abdominal pain in ER which led to CT abdomen. 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  CT chest without contrast showed tiny 2 mm right upper lobe pulmonary nodule. No other evidence of metastatic disease. Baseline CEA elevated at 4.8  Patient underwent exploratory laparotomy with right hemicolectomy and terminal ileum resection as well as separate small bowel resection on 12/06/2017. Operative findings showed a large cecal tumor with extramural spread to retroperitoneum mesentery, mesentery lymph nodes and parietal peritoneum. Dense inflammatory response of the retroperitoneum and small bowel.There was a second portion of small bowel that was involved and was resected. A small portion of free tumor was identified in the retroperitoneum which was sent off for examination as well.  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 Radial margin positive for invasive carcinoma 2 colon adenomas 2.5 and 1.8  cm of the ascending colon. Metastatic adenocarcinoma in 4 out of 13 lymph nodes. 2 tumor deposits Segment of small intestine with abscess which also showed adenocarcinoma invading the distal appendix with perforation. One mesenteric lymph node negative for malignancy. Retroperitoneal tumor removal was also positive for mucinous adenocarcinoma. Tumor was grade 2. Proximal and distal margins were negative.[id number]. MSI stable. KRAS mutation positive  Case discussed at tumor board. PET CT will not be done at this time as it may represent post surgical changes. Radiation oncology does not recommend adjuvant RT upon completion of chemotherapy.she was found to have recurrent small bowel soft tissue mass and peritoneal nodularity after 5 cycles of folfox. FOLFOX avastin to be given with a palliative intent. oxaliplatin discontinued after 5 cycles due to progressive thrombocytopenia requiring treatment interruptions  Interval history- she is doing well. Denies any complaints today. She was started on amlodipine by her pcp a week ago. Her systolic BP has been running between 140-160 mostly 150/160 at group home.   ECOG PS- 1 Pain scale- 0 Opioid associated constipation- no  Review of systems- Review of Systems  Constitutional: Negative for chills, fever, malaise/fatigue and weight loss.  HENT: Negative for congestion, ear discharge and nosebleeds.   Eyes: Negative for blurred vision.  Respiratory: Negative for cough, hemoptysis, sputum production, shortness of breath and wheezing.   Cardiovascular: Negative for chest pain, palpitations, orthopnea and claudication.  Gastrointestinal: Negative for abdominal pain, blood in stool, constipation, diarrhea, heartburn, melena, nausea and vomiting.  Genitourinary: Negative for dysuria, flank pain, frequency, hematuria and urgency.  Musculoskeletal: Negative for back pain, joint pain and myalgias.  Skin: Negative for rash.  Neurological: Negative for  dizziness, tingling, focal weakness, seizures, weakness  and headaches.  Endo/Heme/Allergies: Does not bruise/bleed easily.  Psychiatric/Behavioral: Negative for depression and suicidal ideas. The patient does not have insomnia.      No Known Allergies   Past Medical History:  Diagnosis Date  . Cancer [facility])      Past Surgical History:  Procedure Laterality Date  . COLOSTOMY REVISION N/A 12/06/2017   Procedure: COLON RESECTION RIGHT;  Surgeon: Florene Glen, MD;  Location: [facility] ORS;  Service: General;  Laterality: N/A;  . DILATION AND CURETTAGE, DIAGNOSTIC / THERAPEUTIC    . PORTACATH PLACEMENT N/A 01/06/2018   Procedure: INSERTION PORT-A-CATH;  Surgeon: Vickie Epley, MD;  Location: [facility] ORS;  Service: General;  Laterality: N/A;    Social History   Socioeconomic History  . Marital status: Widowed    Spouse name: Not on file  . Number of children: Not on file  . Years of education: Not on file  . Highest education level: Not on file  Occupational History  . Not on file  Social Needs  . Financial resource strain: Patient refused  . Food insecurity:    Worry: Patient refused    Inability: Patient refused  . Transportation needs:    Medical: Patient refused    Non-medical: Patient refused  Tobacco Use  . Smoking status: Former Research scientist (life sciences)  . Smokeless tobacco: Never Used  Substance and Sexual Activity  . Alcohol use: No  . Drug use: No  . Sexual activity: Never  Lifestyle  . Physical activity:    Days per week: Patient refused    Minutes per session: Patient refused  . Stress: Patient refused  Relationships  . Social connections:    Talks on phone: Patient refused    Gets together: Patient refused    Attends religious service: Patient refused    Active member of club or organization: Patient refused    Attends meetings of clubs or organizations: Patient refused    Relationship status: Patient refused  . Intimate partner violence:    Fear of current or ex  partner: Patient refused    Emotionally abused: Patient refused    Physically abused: Patient refused    Forced sexual activity: Patient refused  Other Topics Concern  . Not on file  Social History Narrative  . Not on file    Family History  Problem Relation Age of Onset  . ALS Mother   . ALS Father      Current Outpatient Medications:  .  acetaminophen (TYLENOL) 500 MG tablet, Take 1,000 mg by mouth every 8 (eight) hours as needed for mild pain., Disp: , Rfl:  .  alendronate (FOSAMAX) 70 MG tablet, Take 70 mg by mouth once a week., Disp: , Rfl:  .  amLODipine (NORVASC) 10 MG tablet, Take 10 mg by mouth daily., Disp: , Rfl:  .  ARIPiprazole (ABILIFY) 5 MG tablet, Take 5 mg by mouth at bedtime. , Disp: , Rfl:  .  benztropine (COGENTIN) 0.5 MG tablet, Take 0.5 mg by mouth at bedtime., Disp: , Rfl:  .  Calcium 600-200 MG-UNIT tablet, Take 1 tablet by mouth daily., Disp: , Rfl:  .  divalproex (DEPAKOTE) 500 MG DR tablet, Take 500 mg by mouth at bedtime. , Disp: , Rfl:  .  loratadine (CLARITIN) 10 MG tablet, Take 1 tablet (10 mg total) by mouth daily as needed for allergies. Take 1 tablet the day before pump removal, 1 tablet the day of pump removal and 1 tablet the day after pump removal. Take as  directed with each cycle of  chemotherapy treatment, Disp: 30 tablet, Rfl: 1 .  memantine (NAMENDA XR) 14 MG CP24 24 hr capsule, Take 14 mg by mouth at bedtime. , Disp: , Rfl:  .  metoprolol succinate (TOPROL-XL) 50 MG 24 hr tablet, Take 50 mg by mouth every morning. Take with or immediately following a meal. , Disp: , Rfl:  .  perphenazine (TRILAFON) 4 MG tablet, Take 4 mg by mouth 2 (two) times daily., Disp: , Rfl:  .  calcium carbonate (TUMS - DOSED IN MG ELEMENTAL CALCIUM) 500 MG chewable tablet, Chew 1 tablet by mouth 2 (two) times daily as needed for indigestion or heartburn., Disp: , Rfl:  .  dexamethasone (DECADRON) 4 MG tablet, Take 1 tablet (4 mg total) by mouth 2 (two) times daily with a  meal. Starting the day after chemotherapy for 2 days (Patient not taking: Reported on 04/24/2018), Disp: 30 tablet, Rfl: 0 .  lidocaine-prilocaine (EMLA) cream, Apply 1 application topically as needed. 1 hour prior to coming for each chemotherapy treatment. Place small amount over port site and place saran wrap over the cream to protect the clothing (Patient not taking: Reported on 04/24/2018), Disp: 30 g, Rfl: 0 .  Multiple Vitamin (MULTIVITAMIN WITH MINERALS) TABS tablet, Take 1 tablet by mouth daily. (Patient not taking: Reported on 05/15/2018), Disp: 30 tablet, Rfl: 0 .  ondansetron (ZOFRAN) 8 MG tablet, Take 1 tablet (8 mg total) by mouth 2 (two) times daily as needed for nausea or vomiting. Can start using on day 3 after chemo (Patient not taking: Reported on 04/24/2018), Disp: 20 tablet, Rfl: 0 .  prochlorperazine (COMPAZINE) 10 MG tablet, Take 1 tablet (10 mg total) by mouth every 6 (six) hours as needed for nausea or vomiting. (Patient not taking: Reported on 04/24/2018), Disp: 30 tablet, Rfl: 0 No current facility-administered medications for this visit.   Facility-Administered Medications Ordered in Other Visits:  .  dexamethasone (DECADRON) injection 10 mg, 10 mg, Intravenous, Once, Sindy Guadeloupe, MD .  dextrose 5 % solution, , Intravenous, Once, Sindy Guadeloupe, MD .  fluorouracil (ADRUCIL) 4,800 mg in sodium chloride 0.9 % 54 mL chemo infusion, 2,400 mg/m2 (Treatment Plan Recorded), Intravenous, 1 day or 1 dose, Sindy Guadeloupe, MD .  fluorouracil (ADRUCIL) chemo injection 800 mg, 400 mg/m2 (Treatment Plan Recorded), Intravenous, Once, Sindy Guadeloupe, MD .  heparin lock flush 100 unit/mL, 500 Units, Intravenous, Once, Sindy Guadeloupe, MD .  heparin lock flush 100 unit/mL, 500 Units, Intracatheter, Once PRN, Sindy Guadeloupe, MD .  heparin lock flush 100 unit/mL, 500 Units, Intravenous, Once, Sindy Guadeloupe, MD .  heparin lock flush 100 unit/mL, 500 Units, Intracatheter, Once PRN, Sindy Guadeloupe,  MD .  leucovorin injection 40 mg, 20 mg/m2, Intravenous, Once, Sindy Guadeloupe, MD .  sodium chloride flush (NS) 0.9 % injection 10 mL, 10 mL, Intravenous, PRN, Sindy Guadeloupe, MD, 10 mL at 02/06/18 0845 .  sodium chloride flush (NS) 0.9 % injection 10 mL, 10 mL, Intracatheter, PRN, Sindy Guadeloupe, MD .  sodium chloride flush (NS) 0.9 % injection 10 mL, 10 mL, Intracatheter, PRN, Sindy Guadeloupe, MD, 10 mL at 03/06/18 0900 .  sodium chloride flush (NS) 0.9 % injection 10 mL, 10 mL, Intravenous, PRN, Sindy Guadeloupe, MD, 10 mL at 03/27/18 0909 .  sodium chloride flush (NS) 0.9 % injection 10 mL, 10 mL, Intracatheter, PRN, Sindy Guadeloupe, MD, 10 mL at 06/05/18 1000  Physical exam:  Vitals:   06/05/18 0910  BP: (!) 150/67  Pulse: (!) 56  Resp: 18  Temp: 98.3 F (36.8 C)  TempSrc: Tympanic  SpO2: 100%  Weight: 185 lb 6.5 oz (84.1 kg)  Height: _0  (1.651 m)   Physical Exam  Constitutional: She is oriented to person, place, and time. She appears well-developed and well-nourished.  HENT:  Head: Normocephalic and atraumatic.  Eyes: Pupils are equal, round, and reactive to light. EOM are normal.  Neck: Normal range of motion.  Cardiovascular: Normal rate, regular rhythm and normal heart sounds.  Pulmonary/Chest: Effort normal and breath sounds normal.  Abdominal: Soft. Bowel sounds are normal.  Transverse surgical scar  Musculoskeletal: She exhibits edema (b/l ankle edema).  Neurological: She is alert and oriented to person, place, and time.  Skin: Skin is warm and dry.     CMP Latest Ref Rng & Units 05/29/2018  Glucose 70 - 99 mg/dL 102(H)  BUN 8 - 23 mg/dL 8  Creatinine 0.44 - 1.00 mg/dL 0.66  Sodium 135 - 145 mmol/L 135  Potassium 3.5 - 5.1 mmol/L 4.1  Chloride 98 - 111 mmol/L 101  CO2 22 - 32 mmol/L 23  Calcium 8.9 - 10.3 mg/dL 9.1  Total Protein 6.5 - 8.1 g/dL 6.7  Total Bilirubin 0.3 - 1.2 mg/dL 0.6  Alkaline Phos 38 - 126 U/L 85  AST 15 - 41 U/L 21  ALT 0 - 44 U/L 14    CBC Latest Ref Rng & Units 05/29/2018  WBC 3.6 - 11.0 K/uL 5.3  Hemoglobin 12.0 - 16.0 g/dL 12.5  Hematocrit 35.0 - 47.0 % 36.6  Platelets 150 - 440 K/uL 120(L)      Assessment and plan- Patient is a 72 y.o. female at least stage IIIC pT4b pN2aMxstatus post hemicolectomy(probably stage IV given peritoneal nodularity) . She is here for on treatment assessment pror to cycle 8 of 5FU/ avastin chemotherapy today  Counts from last week ok to proceed with cycle 8 of 6f chemotherapy. neulasta on day 3. oxaliplatin has been discontinued due to persistent thrombocytopenia despite treatment interruptions. Her systolic BP has been running in 150's. I would like it to be lower than /=140 for me to restart avastin.   I will therefore hold her avastin today.I have asked her caregiver to keep daily tab of her BP. If BP remains consistently >140, she needs to get in touch with her pcp regarding modifying her BP regimen. I will see her back in 2 weeks for 557f+- avastin with cbc/ cmp and urine protein random. Repeat imaging scans in 6 weeks     Visit Diagnosis 1. Colon adenocarcinoma ([place]  2. Encounter for antineoplastic chemotherapy   3. Chemotherapy-induced thrombocytopenia      Dr. ArRanda EvensMD, MPH CHLiberty Amb[facility] Surgery [facility] LLCt AlAdvent[id number]/15/2019 10:23 AM

## 2018-06-05 NOTE — Progress Notes (Signed)
No new changes noted today 

## 2018-06-07 ENCOUNTER — Inpatient Hospital Stay: Payer: Medicare Other

## 2018-06-07 DIAGNOSIS — C189 Malignant neoplasm of colon, unspecified: Secondary | ICD-10-CM

## 2018-06-07 DIAGNOSIS — Z5111 Encounter for antineoplastic chemotherapy: Secondary | ICD-10-CM | POA: Diagnosis not present

## 2018-06-07 MED ORDER — HEPARIN SOD (PORK) LOCK FLUSH 100 UNIT/ML IV SOLN
500.0000 [IU] | Freq: Once | INTRAVENOUS | Status: AC | PRN
  Administered 2018-06-07: 500 [IU]

## 2018-06-07 MED ORDER — PEGFILGRASTIM INJECTION 6 MG/0.6ML ~~LOC~~
6.0000 mg | PREFILLED_SYRINGE | Freq: Once | SUBCUTANEOUS | Status: AC
  Administered 2018-06-07: 6 mg via SUBCUTANEOUS

## 2018-06-07 MED ORDER — SODIUM CHLORIDE 0.9% FLUSH
10.0000 mL | INTRAVENOUS | Status: DC | PRN
  Administered 2018-06-07: 10 mL
  Filled 2018-06-07: qty 10

## 2018-06-19 ENCOUNTER — Inpatient Hospital Stay: Payer: Medicare Other

## 2018-06-19 ENCOUNTER — Encounter: Payer: Self-pay | Admitting: Oncology

## 2018-06-19 ENCOUNTER — Inpatient Hospital Stay (HOSPITAL_BASED_OUTPATIENT_CLINIC_OR_DEPARTMENT_OTHER): Payer: Medicare Other | Admitting: Oncology

## 2018-06-19 VITALS — BP 108/73 | HR 80 | Temp 97.3°F | Resp 18 | Ht 65.0 in | Wt 189.8 lb

## 2018-06-19 VITALS — BP 152/80 | HR 59 | Temp 96.0°F | Resp 18

## 2018-06-19 DIAGNOSIS — I160 Hypertensive urgency: Secondary | ICD-10-CM

## 2018-06-19 DIAGNOSIS — Z5111 Encounter for antineoplastic chemotherapy: Secondary | ICD-10-CM | POA: Diagnosis not present

## 2018-06-19 DIAGNOSIS — R030 Elevated blood-pressure reading, without diagnosis of hypertension: Secondary | ICD-10-CM

## 2018-06-19 DIAGNOSIS — D6959 Other secondary thrombocytopenia: Secondary | ICD-10-CM

## 2018-06-19 DIAGNOSIS — C182 Malignant neoplasm of ascending colon: Secondary | ICD-10-CM

## 2018-06-19 DIAGNOSIS — C189 Malignant neoplasm of colon, unspecified: Secondary | ICD-10-CM

## 2018-06-19 DIAGNOSIS — D696 Thrombocytopenia, unspecified: Secondary | ICD-10-CM

## 2018-06-19 DIAGNOSIS — Z79899 Other long term (current) drug therapy: Secondary | ICD-10-CM

## 2018-06-19 DIAGNOSIS — T451X5A Adverse effect of antineoplastic and immunosuppressive drugs, initial encounter: Secondary | ICD-10-CM

## 2018-06-19 LAB — COMPREHENSIVE METABOLIC PANEL
ALK PHOS: 93 U/L (ref 38–126)
ALT: 17 U/L (ref 0–44)
AST: 23 U/L (ref 15–41)
Albumin: 4.1 g/dL (ref 3.5–5.0)
Anion gap: 11 (ref 5–15)
BUN: 12 mg/dL (ref 8–23)
CO2: 22 mmol/L (ref 22–32)
CREATININE: 0.75 mg/dL (ref 0.44–1.00)
Calcium: 9.2 mg/dL (ref 8.9–10.3)
Chloride: 101 mmol/L (ref 98–111)
GFR calc Af Amer: >60 mL/min (ref >60)
GFR calc non Af Amer: >60 mL/min (ref >60)
Glucose, Bld: 105 mg/dL — ABNORMAL HIGH (ref 70–99)
POTASSIUM: 3.8 mmol/L (ref 3.5–5.1)
SODIUM: 134 mmol/L — AB (ref 135–145)
TOTAL PROTEIN: 6.8 g/dL (ref 6.5–8.1)
Total Bilirubin: 0.8 mg/dL (ref 0.3–1.2)

## 2018-06-19 LAB — CBC WITH DIFFERENTIAL/PLATELET
BASOS ABS: 0.1 10*3/uL (ref 0–0.1)
Basophils Relative: 1 %
EOS ABS: 0.1 10*3/uL (ref 0–0.7)
Eosinophils Relative: 1 %
HCT: 40.2 % (ref 35.0–47.0)
HEMOGLOBIN: 13.8 g/dL (ref 12.0–16.0)
LYMPHS ABS: 1.9 10*3/uL (ref 1.0–3.6)
Lymphocytes Relative: 28 %
MCH: 34.1 pg — ABNORMAL HIGH (ref 26.0–34.0)
MCHC: 34.4 g/dL (ref 32.0–36.0)
MCV: 99.2 fL (ref 80.0–100.0)
Monocytes Absolute: 0.5 10*3/uL (ref 0.2–0.9)
Monocytes Relative: 8 %
NEUTROS PCT: 62 %
Neutro Abs: 4.3 10*3/uL (ref 1.4–6.5)
PLATELETS: 128 10*3/uL — AB (ref 150–440)
RBC: 4.05 MIL/uL (ref 3.80–5.20)
RDW: 16.2 % — ABNORMAL HIGH (ref 11.5–14.5)
WBC: 6.9 10*3/uL (ref 3.6–11.0)

## 2018-06-19 LAB — PROTEIN, URINE, RANDOM: Total Protein, Urine: 100 mg/dL

## 2018-06-19 MED ORDER — FLUOROURACIL CHEMO INJECTION 2.5 GM/50ML
400.0000 mg/m2 | Freq: Once | INTRAVENOUS | Status: AC
  Administered 2018-06-19: 800 mg via INTRAVENOUS
  Filled 2018-06-19: qty 16

## 2018-06-19 MED ORDER — DEXAMETHASONE SODIUM PHOSPHATE 10 MG/ML IJ SOLN
10.0000 mg | Freq: Once | INTRAMUSCULAR | Status: AC
  Administered 2018-06-19: 10 mg via INTRAVENOUS
  Filled 2018-06-19: qty 1

## 2018-06-19 MED ORDER — LEUCOVORIN CALCIUM INJECTION 100 MG
20.0000 mg/m2 | Freq: Once | INTRAMUSCULAR | Status: AC
  Administered 2018-06-19: 40 mg via INTRAVENOUS
  Filled 2018-06-19: qty 2

## 2018-06-19 MED ORDER — SODIUM CHLORIDE 0.9 % IV SOLN
2400.0000 mg/m2 | INTRAVENOUS | Status: DC
  Administered 2018-06-19: 4800 mg via INTRAVENOUS
  Filled 2018-06-19: qty 50
  Filled 2018-06-19: qty 96

## 2018-06-19 MED ORDER — SODIUM CHLORIDE 0.9 % IV SOLN
900.0000 mg | Freq: Once | INTRAVENOUS | Status: AC
  Administered 2018-06-19: 900 mg via INTRAVENOUS
  Filled 2018-06-19: qty 32

## 2018-06-19 MED ORDER — SODIUM CHLORIDE 0.9 % IV SOLN
Freq: Once | INTRAVENOUS | Status: AC
  Administered 2018-06-19: 11:00:00 via INTRAVENOUS
  Filled 2018-06-19: qty 1000

## 2018-06-19 MED ORDER — LEUCOVORIN CALCIUM INJECTION 350 MG: 800.0000 mg | Freq: Once | INTRAVENOUS | Status: DC

## 2018-06-19 MED ORDER — PALONOSETRON HCL INJECTION 0.25 MG/5ML
0.2500 mg | Freq: Once | INTRAVENOUS | Status: AC
  Administered 2018-06-19: 0.25 mg via INTRAVENOUS
  Filled 2018-06-19: qty 5

## 2018-06-19 NOTE — Addendum Note (Signed)
Addended by: Vito Berger on: 06/19/2018 10:54 AM   Modules accepted: Orders

## 2018-06-19 NOTE — Progress Notes (Signed)
Hematology/Oncology Consult note [facility]  [phone] [phone]  Patient Care Team: Lin Givens, MD as PCP - General (Family Medicine) Clent Jacks, RN as Registered Nurse   Name of the patient: Latoya Jones  [id number]  03-30-1946   Date of visit: 06/19/18  Diagnosis- adenocarcinoma of the colon at least stage IIIC pT4b pN2aMxstatus post hemicolectomy(possibly stage IV given peritoneal nodularity seen on CT)   Chief complaint/ Reason for visit-on treatment assessment prior to cycle #9 of FOLFOX and Avastin chemotherapy  Heme/Onc history: Patient is a 72 year old female with a h/o developmental and mood disoredr who resides at a group home. She presented to the ER with symptoms of dizziness mostly when she stands up. She has had problems with constipation in the past and takes prune juice to relieve it. Patient is a poor historian at baseline and complained of abdominal pain in ER which led to CT abdomen. 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  CT chest without contrast showed tiny 2 mm right upper lobe pulmonary nodule. No other evidence of metastatic disease. Baseline CEA elevated at 4.8  Patient underwent exploratory laparotomy with right hemicolectomy and terminal ileum resection as well as separate small bowel resection on 12/06/2017. Operative findings showed a large cecal tumor with extramural spread to retroperitoneum mesentery, mesentery lymph nodes and parietal peritoneum. Dense inflammatory response of the retroperitoneum and small bowel.There was a second portion of small bowel that was involved and was resected. A small portion of free tumor was identified in the retroperitoneum which was sent off for examination as well.  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 Radial margin positive for invasive carcinoma 2 colon adenomas 2.5  and 1.8 cm of the ascending colon. Metastatic adenocarcinoma in 4 out of 13 lymph nodes. 2 tumor deposits Segment of small intestine with abscess which also showed adenocarcinoma invading the distal appendix with perforation. One mesenteric lymph node negative for malignancy. Retroperitoneal tumor removal was also positive for mucinous adenocarcinoma. Tumor was grade 2. Proximal and distal margins were negative.pT4bpN2a. MSI stable. KRAS mutation positive  Case discussed at tumor board. PET CT will not be done at this time as it may represent post surgical changes. Radiation oncology does not recommend adjuvant RT upon completion of chemotherapy.she was found to have recurrent small bowel soft tissue mass and peritoneal nodularity after 5 cycles of folfox. FOLFOX avastin to be given with a palliative intent. oxaliplatin discontinued after 5 cycles due to progressive thrombocytopenia requiring treatment interruptions    Interval history- overall she is feeling well. No nausea, fatigue. Denies any tingling numbness  ECOG PS- 1 Pain scale- 0 Opioid associated constipation- no  Review of systems- Review of Systems  Constitutional: Negative for chills, fever, malaise/fatigue and weight loss.  HENT: Negative for congestion, ear discharge and nosebleeds.   Eyes: Negative for blurred vision.  Respiratory: Negative for cough, hemoptysis, sputum production, shortness of breath and wheezing.   Cardiovascular: Negative for chest pain, palpitations, orthopnea and claudication.  Gastrointestinal: Negative for abdominal pain, blood in stool, constipation, diarrhea, heartburn, melena, nausea and vomiting.  Genitourinary: Negative for dysuria, flank pain, frequency, hematuria and urgency.  Musculoskeletal: Negative for back pain, joint pain and myalgias.  Skin: Negative for rash.  Neurological: Negative for dizziness, tingling, focal weakness, seizures, weakness and headaches.  Endo/Heme/Allergies:  Does not bruise/bleed easily.  Psychiatric/Behavioral: Negative for depression and suicidal ideas. The patient does not  have insomnia.       No Known Allergies   Past Medical History:  Diagnosis Date  . Cancer [facility])      Past Surgical History:  Procedure Laterality Date  . COLOSTOMY REVISION N/A 12/06/2017   Procedure: COLON RESECTION RIGHT;  Surgeon: Florene Glen, MD;  Location: [facility] ORS;  Service: General;  Laterality: N/A;  . DILATION AND CURETTAGE, DIAGNOSTIC / THERAPEUTIC    . PORTACATH PLACEMENT N/A 01/06/2018   Procedure: INSERTION PORT-A-CATH;  Surgeon: Vickie Epley, MD;  Location: [facility] ORS;  Service: General;  Laterality: N/A;    Social History   Socioeconomic History  . Marital status: Widowed    Spouse name: Not on file  . Number of children: Not on file  . Years of education: Not on file  . Highest education level: Not on file  Occupational History  . Not on file  Social Needs  . Financial resource strain: Patient refused  . Food insecurity:    Worry: Patient refused    Inability: Patient refused  . Transportation needs:    Medical: Patient refused    Non-medical: Patient refused  Tobacco Use  . Smoking status: Former Research scientist (life sciences)  . Smokeless tobacco: Never Used  Substance and Sexual Activity  . Alcohol use: No  . Drug use: No  . Sexual activity: Never  Lifestyle  . Physical activity:    Days per week: Patient refused    Minutes per session: Patient refused  . Stress: Patient refused  Relationships  . Social connections:    Talks on phone: Patient refused    Gets together: Patient refused    Attends religious service: Patient refused    Active member of club or organization: Patient refused    Attends meetings of clubs or organizations: Patient refused    Relationship status: Patient refused  . Intimate partner violence:    Fear of current or ex partner: Patient refused    Emotionally abused: Patient refused    Physically abused: Patient  refused    Forced sexual activity: Patient refused  Other Topics Concern  . Not on file  Social History Narrative  . Not on file    Family History  Problem Relation Age of Onset  . ALS Mother   . ALS Father      Current Outpatient Medications:  .  acetaminophen (TYLENOL) 500 MG tablet, Take 1,000 mg by mouth every 8 (eight) hours as needed for mild pain., Disp: , Rfl:  .  alendronate (FOSAMAX) 70 MG tablet, Take 70 mg by mouth once a week., Disp: , Rfl:  .  amLODipine (NORVASC) 10 MG tablet, Take 10 mg by mouth daily., Disp: , Rfl:  .  ARIPiprazole (ABILIFY) 5 MG tablet, Take 5 mg by mouth at bedtime. , Disp: , Rfl:  .  benztropine (COGENTIN) 0.5 MG tablet, Take 0.5 mg by mouth at bedtime., Disp: , Rfl:  .  Calcium 600-200 MG-UNIT tablet, Take 1 tablet by mouth daily., Disp: , Rfl:  .  calcium carbonate (TUMS - DOSED IN MG ELEMENTAL CALCIUM) 500 MG chewable tablet, Chew 1 tablet by mouth 2 (two) times daily as needed for indigestion or heartburn., Disp: , Rfl:  .  dexamethasone (DECADRON) 4 MG tablet, Take 1 tablet (4 mg total) by mouth 2 (two) times daily with a meal. Starting the day after chemotherapy for 2 days (Patient not taking: Reported on 04/24/2018), Disp: 30 tablet, Rfl: 0 .  divalproex (DEPAKOTE) 500 MG DR tablet, Take  500 mg by mouth at bedtime. , Disp: , Rfl:  .  lidocaine-prilocaine (EMLA) cream, Apply 1 application topically as needed. 1 hour prior to coming for each chemotherapy treatment. Place small amount over port site and place saran wrap over the cream to protect the clothing (Patient not taking: Reported on 04/24/2018), Disp: 30 g, Rfl: 0 .  loratadine (CLARITIN) 10 MG tablet, Take 1 tablet (10 mg total) by mouth daily as needed for allergies. Take 1 tablet the day before pump removal, 1 tablet the day of pump removal and 1 tablet the day after pump removal. Take as directed with each cycle of  chemotherapy treatment, Disp: 30 tablet, Rfl: 1 .  memantine (NAMENDA XR) 14  MG CP24 24 hr capsule, Take 14 mg by mouth at bedtime. , Disp: , Rfl:  .  metoprolol succinate (TOPROL-XL) 50 MG 24 hr tablet, Take 50 mg by mouth every morning. Take with or immediately following a meal. , Disp: , Rfl:  .  Multiple Vitamin (MULTIVITAMIN WITH MINERALS) TABS tablet, Take 1 tablet by mouth daily. (Patient not taking: Reported on 05/15/2018), Disp: 30 tablet, Rfl: 0 .  ondansetron (ZOFRAN) 8 MG tablet, Take 1 tablet (8 mg total) by mouth 2 (two) times daily as needed for nausea or vomiting. Can start using on day 3 after chemo (Patient not taking: Reported on 04/24/2018), Disp: 20 tablet, Rfl: 0 .  perphenazine (TRILAFON) 4 MG tablet, Take 4 mg by mouth 2 (two) times daily., Disp: , Rfl:  .  prochlorperazine (COMPAZINE) 10 MG tablet, Take 1 tablet (10 mg total) by mouth every 6 (six) hours as needed for nausea or vomiting. (Patient not taking: Reported on 04/24/2018), Disp: 30 tablet, Rfl: 0 No current facility-administered medications for this visit.   Facility-Administered Medications Ordered in Other Visits:  .  dextrose 5 % solution, , Intravenous, Once, Randa Evens C, MD .  heparin lock flush 100 unit/mL, 500 Units, Intravenous, Once, Sindy Guadeloupe, MD .  heparin lock flush 100 unit/mL, 500 Units, Intracatheter, Once PRN, Sindy Guadeloupe, MD .  heparin lock flush 100 unit/mL, 500 Units, Intravenous, Once, Sindy Guadeloupe, MD .  sodium chloride flush (NS) 0.9 % injection 10 mL, 10 mL, Intravenous, PRN, Sindy Guadeloupe, MD, 10 mL at 02/06/18 0845 .  sodium chloride flush (NS) 0.9 % injection 10 mL, 10 mL, Intracatheter, PRN, Sindy Guadeloupe, MD .  sodium chloride flush (NS) 0.9 % injection 10 mL, 10 mL, Intracatheter, PRN, Sindy Guadeloupe, MD, 10 mL at 03/06/18 0900 .  sodium chloride flush (NS) 0.9 % injection 10 mL, 10 mL, Intravenous, PRN, Sindy Guadeloupe, MD, 10 mL at 03/27/18 0909  Physical exam:  Vitals:   06/19/18 0957 06/19/18 0958  BP: 108/73   Pulse: 80   Resp: 18   Temp:   (!) 97.3 F (36.3 C)  TempSrc: Tympanic Tympanic  SpO2: 100%   Weight: 189 lb 13.1 oz (86.1 kg)   Height: _0  (1.651 m)    Physical Exam  Constitutional: She is oriented to person, place, and time.  HENT:  Head: Normocephalic and atraumatic.  Eyes: EOM are normal.  Neck: Normal range of motion.  Cardiovascular: Normal rate, regular rhythm and normal heart sounds.  Pulmonary/Chest: Effort normal and breath sounds normal.  Abdominal: Soft. Bowel sounds are normal.  Musculoskeletal: She exhibits edema (b/l ankle edema chronic).  Neurological: She is alert and oriented to person, place, and time.  Skin: Skin is  warm and dry.     CMP Latest Ref Rng & Units 05/29/2018  Glucose 70 - 99 mg/dL 102(H)  BUN 8 - 23 mg/dL 8  Creatinine 0.44 - 1.00 mg/dL 0.66  Sodium 135 - 145 mmol/L 135  Potassium 3.5 - 5.1 mmol/L 4.1  Chloride 98 - 111 mmol/L 101  CO2 22 - 32 mmol/L 23  Calcium 8.9 - 10.3 mg/dL 9.1  Total Protein 6.5 - 8.1 g/dL 6.7  Total Bilirubin 0.3 - 1.2 mg/dL 0.6  Alkaline Phos 38 - 126 U/L 85  AST 15 - 41 U/L 21  ALT 0 - 44 U/L 14   CBC Latest Ref Rng & Units 05/29/2018  WBC 3.6 - 11.0 K/uL 5.3  Hemoglobin 12.0 - 16.0 g/dL 12.5  Hematocrit 35.0 - 47.0 % 36.6  Platelets 150 - 440 K/uL 120(L)      Assessment and plan- Patient is a 72 y.o. female at least stage IIIC pT4b pN2aMxstatus post hemicolectomy(probably stage IV given peritoneal nodularity).  She is here for on treatment assessment prior to cycle #9 of 5-FU/ Avastin chemotherapy  Counts okay to proceed with cycle #9 of 5-FU and Avastin chemotherapy today.  Her blood pressure today is in the 100 and at her group home her blood pressure has been consistently in the 120s but never more than 1 4150.  I will therefore restart her a Avastin today.  I will see her back in 2 weeks time with CBC and CMP for cycle #10 of 5-FU and Avastin chemotherapy.  Plan to repeat scans after 12 cycles   Mild thrombocytopenia: Likely  secondary to chemotherapy continue to monitor     Visit Diagnosis 1. Colon adenocarcinoma ([place])   2. Encounter for antineoplastic chemotherapy   3. Chemotherapy-induced thrombocytopenia      Dr. Randa Evens, MD, MPH [facility] at [facility] [phone] 06/19/2018 10:28 AM

## 2018-06-19 NOTE — Progress Notes (Signed)
No new changes noted today 

## 2018-06-21 ENCOUNTER — Inpatient Hospital Stay: Payer: Medicare Other

## 2018-06-21 VITALS — BP 135/80 | HR 57 | Temp 96.7°F | Resp 18

## 2018-06-21 DIAGNOSIS — Z5111 Encounter for antineoplastic chemotherapy: Secondary | ICD-10-CM | POA: Diagnosis not present

## 2018-06-21 DIAGNOSIS — C189 Malignant neoplasm of colon, unspecified: Secondary | ICD-10-CM

## 2018-06-21 MED ORDER — HEPARIN SOD (PORK) LOCK FLUSH 100 UNIT/ML IV SOLN
500.0000 [IU] | Freq: Once | INTRAVENOUS | Status: AC | PRN
  Administered 2018-06-21: 500 [IU]

## 2018-06-21 MED ORDER — PEGFILGRASTIM INJECTION 6 MG/0.6ML ~~LOC~~
6.0000 mg | PREFILLED_SYRINGE | Freq: Once | SUBCUTANEOUS | Status: AC
  Administered 2018-06-21: 6 mg via SUBCUTANEOUS

## 2018-06-21 MED ORDER — SODIUM CHLORIDE 0.9% FLUSH
10.0000 mL | INTRAVENOUS | Status: DC | PRN
  Administered 2018-06-21: 10 mL
  Filled 2018-06-21: qty 10

## 2018-07-03 ENCOUNTER — Other Ambulatory Visit: Payer: Self-pay | Admitting: *Deleted

## 2018-07-03 ENCOUNTER — Encounter: Payer: Self-pay | Admitting: Oncology

## 2018-07-03 ENCOUNTER — Inpatient Hospital Stay: Payer: Medicare Other | Attending: Oncology | Admitting: Oncology

## 2018-07-03 ENCOUNTER — Inpatient Hospital Stay: Payer: Medicare Other

## 2018-07-03 VITALS — BP 140/70 | HR 68 | Temp 97.0°F | Resp 18

## 2018-07-03 VITALS — BP 136/60 | HR 56 | Temp 97.4°F | Resp 18 | Ht 65.0 in | Wt 192.0 lb

## 2018-07-03 DIAGNOSIS — Z87891 Personal history of nicotine dependence: Secondary | ICD-10-CM

## 2018-07-03 DIAGNOSIS — Z5111 Encounter for antineoplastic chemotherapy: Secondary | ICD-10-CM

## 2018-07-03 DIAGNOSIS — D6959 Other secondary thrombocytopenia: Secondary | ICD-10-CM | POA: Diagnosis not present

## 2018-07-03 DIAGNOSIS — C182 Malignant neoplasm of ascending colon: Secondary | ICD-10-CM | POA: Diagnosis not present

## 2018-07-03 DIAGNOSIS — R911 Solitary pulmonary nodule: Secondary | ICD-10-CM | POA: Insufficient documentation

## 2018-07-03 DIAGNOSIS — Z79899 Other long term (current) drug therapy: Secondary | ICD-10-CM | POA: Diagnosis not present

## 2018-07-03 DIAGNOSIS — Z9049 Acquired absence of other specified parts of digestive tract: Secondary | ICD-10-CM | POA: Diagnosis not present

## 2018-07-03 DIAGNOSIS — C189 Malignant neoplasm of colon, unspecified: Secondary | ICD-10-CM

## 2018-07-03 DIAGNOSIS — Z5112 Encounter for antineoplastic immunotherapy: Secondary | ICD-10-CM

## 2018-07-03 LAB — COMPREHENSIVE METABOLIC PANEL
ALBUMIN: 4 g/dL (ref 3.5–5.0)
ALT: 20 U/L (ref 0–44)
AST: 28 U/L (ref 15–41)
Alkaline Phosphatase: 101 U/L (ref 38–126)
Anion gap: 13 (ref 5–15)
BILIRUBIN TOTAL: 0.8 mg/dL (ref 0.3–1.2)
BUN: 10 mg/dL (ref 8–23)
CO2: 21 mmol/L — ABNORMAL LOW (ref 22–32)
CREATININE: 0.81 mg/dL (ref 0.44–1.00)
Calcium: 9.1 mg/dL (ref 8.9–10.3)
Chloride: 99 mmol/L (ref 98–111)
GFR calc Af Amer: >60 mL/min (ref >60)
GLUCOSE: 113 mg/dL — AB (ref 70–99)
POTASSIUM: 3.9 mmol/L (ref 3.5–5.1)
Sodium: 133 mmol/L — ABNORMAL LOW (ref 135–145)
TOTAL PROTEIN: 6.9 g/dL (ref 6.5–8.1)

## 2018-07-03 LAB — CBC WITH DIFFERENTIAL/PLATELET
Basophils Absolute: 0 10*3/uL (ref 0–0.1)
Basophils Relative: 1 %
Eosinophils Absolute: 0.1 10*3/uL (ref 0–0.7)
Eosinophils Relative: 1 %
HCT: 40 % (ref 35.0–47.0)
HEMOGLOBIN: 13.6 g/dL (ref 12.0–16.0)
Lymphocytes Relative: 31 %
Lymphs Abs: 2 10*3/uL (ref 1.0–3.6)
MCH: 34.3 pg — AB (ref 26.0–34.0)
MCHC: 34 g/dL (ref 32.0–36.0)
MCV: 100.8 fL — ABNORMAL HIGH (ref 80.0–100.0)
MONOS PCT: 9 %
Monocytes Absolute: 0.6 10*3/uL (ref 0.2–0.9)
NEUTROS PCT: 58 %
Neutro Abs: 3.7 10*3/uL (ref 1.4–6.5)
Platelets: 127 10*3/uL — ABNORMAL LOW (ref 150–440)
RBC: 3.97 MIL/uL (ref 3.80–5.20)
RDW: 17.1 % — ABNORMAL HIGH (ref 11.5–14.5)
WBC: 6.5 10*3/uL (ref 3.6–11.0)

## 2018-07-03 LAB — PROTEIN, URINE, RANDOM: TOTAL PROTEIN, URINE: NEGATIVE mg/dL

## 2018-07-03 MED ORDER — SODIUM CHLORIDE 0.9% FLUSH
10.0000 mL | INTRAVENOUS | Status: DC | PRN
  Administered 2018-07-03: 10 mL
  Filled 2018-07-03: qty 10

## 2018-07-03 MED ORDER — LIDOCAINE-PRILOCAINE 2.5-2.5 % EX CREA: 1.0000 "application " | TOPICAL_CREAM | CUTANEOUS | 0 refills | Status: AC | PRN

## 2018-07-03 MED ORDER — SODIUM CHLORIDE 0.9 % IV SOLN
2400.0000 mg/m2 | INTRAVENOUS | Status: DC
  Administered 2018-07-03: 4800 mg via INTRAVENOUS
  Filled 2018-07-03: qty 96

## 2018-07-03 MED ORDER — LEUCOVORIN CALCIUM INJECTION 100 MG
20.0000 mg/m2 | Freq: Once | INTRAMUSCULAR | Status: AC
  Administered 2018-07-03: 40 mg via INTRAVENOUS
  Filled 2018-07-03: qty 2

## 2018-07-03 MED ORDER — LEUCOVORIN CALCIUM INJECTION 350 MG: 800.0000 mg | Freq: Once | INTRAVENOUS | Status: DC

## 2018-07-03 MED ORDER — PALONOSETRON HCL INJECTION 0.25 MG/5ML
0.2500 mg | Freq: Once | INTRAVENOUS | Status: AC
  Administered 2018-07-03: 0.25 mg via INTRAVENOUS
  Filled 2018-07-03: qty 5

## 2018-07-03 MED ORDER — SODIUM CHLORIDE 0.9 % IV SOLN
900.0000 mg | Freq: Once | INTRAVENOUS | Status: AC
  Administered 2018-07-03: 900 mg via INTRAVENOUS
  Filled 2018-07-03: qty 32

## 2018-07-03 MED ORDER — DEXAMETHASONE SODIUM PHOSPHATE 10 MG/ML IJ SOLN
10.0000 mg | Freq: Once | INTRAMUSCULAR | Status: AC
  Administered 2018-07-03: 10 mg via INTRAVENOUS
  Filled 2018-07-03: qty 1

## 2018-07-03 MED ORDER — SODIUM CHLORIDE 0.9 % IV SOLN
Freq: Once | INTRAVENOUS | Status: AC
  Administered 2018-07-03: 10:00:00 via INTRAVENOUS
  Filled 2018-07-03: qty 1000

## 2018-07-03 MED ORDER — FLUOROURACIL CHEMO INJECTION 2.5 GM/50ML
400.0000 mg/m2 | Freq: Once | INTRAVENOUS | Status: AC
  Administered 2018-07-03: 800 mg via INTRAVENOUS
  Filled 2018-07-03: qty 16

## 2018-07-03 MED ORDER — SODIUM CHLORIDE 0.9 % IV SOLN: 10.0000 mg | Freq: Once | INTRAVENOUS | Status: DC

## 2018-07-03 MED ORDER — HEPARIN SOD (PORK) LOCK FLUSH 100 UNIT/ML IV SOLN: 500.0000 [IU] | Freq: Once | INTRAVENOUS | Status: DC | PRN

## 2018-07-03 NOTE — Progress Notes (Signed)
Hematology/Oncology Consult note [facility]  Telephone:([phone] [phone]  Patient Care Team: Lin Givens, MD as PCP - General (Family Medicine) Clent Jacks, RN as Registered Nurse   Name of the patient: Latoya Jones  [id number]  05/28/1946   Date of visit: 07/03/18  Diagnosis- adenocarcinoma of the colon at least stage IIIC pT4b pN2aMxstatus post hemicolectomy(possibly stage IV given peritoneal nodularity seen on CT)  Chief complaint/ Reason for visit-on treatment assessment prior to cycle #10 of 5-FU and Avastin chemotherapy  Heme/Onc history: Patient is a 72 year old female with a h/o developmental and mood disoredr who resides at a group home. She presented to the ER with symptoms of dizziness mostly when she stands up. She has had problems with constipation in the past and takes prune juice to relieve it. Patient is a poor historian at baseline and complained of abdominal pain in ER which led to CT abdomen. 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  CT chest without contrast showed tiny 2 mm right upper lobe pulmonary nodule. No other evidence of metastatic disease. Baseline CEA elevated at 4.8  Patient underwent exploratory laparotomy with right hemicolectomy and terminal ileum resection as well as separate small bowel resection on 12/06/2017. Operative findings showed a large cecal tumor with extramural spread to retroperitoneum mesentery, mesentery lymph nodes and parietal peritoneum. Dense inflammatory response of the retroperitoneum and small bowel.There was a second portion of small bowel that was involved and was resected. A small portion of free tumor was identified in the retroperitoneum which was sent off for examination as well.  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 Radial margin positive for invasive carcinoma 2 colon adenomas 2.5 and  1.8 cm of the ascending colon. Metastatic adenocarcinoma in 4 out of 13 lymph nodes. 2 tumor deposits Segment of small intestine with abscess which also showed adenocarcinoma invading the distal appendix with perforation. One mesenteric lymph node negative for malignancy. Retroperitoneal tumor removal was also positive for mucinous adenocarcinoma. Tumor was grade 2. Proximal and distal margins were negative.pT4bpN2a. MSI stable. KRAS mutation positive  Case discussed at tumor board. PET CT will not be done at this time as it may represent post surgical changes. Radiation oncology does not recommend adjuvant RT upon completion of chemotherapy.she was found to have recurrent small bowel soft tissue mass and peritoneal nodularity after 5 cycles of folfox. FOLFOX avastin to be given with a palliative intent. oxaliplatin discontinued after 5 cycles due to progressive thrombocytopenia requiring treatment interruptions  Interval history-she is doing well.  Denies any complaints today.  She has unable to give [place] 24-hour urine protein collection.  Denies any fatigue abdominal pain nausea vomiting diarrhea or constipation  ECOG PS- 1 Pain scale- 0 Opioid associated constipation- no  Review of systems- Review of Systems  Constitutional: Negative for chills, fever, malaise/fatigue and weight loss.  HENT: Negative for congestion, ear discharge and nosebleeds.   Eyes: Negative for blurred vision.  Respiratory: Negative for cough, hemoptysis, sputum production, shortness of breath and wheezing.   Cardiovascular: Negative for chest pain, palpitations, orthopnea and claudication.  Gastrointestinal: Negative for abdominal pain, blood in stool, constipation, diarrhea, heartburn, melena, nausea and vomiting.  Genitourinary: Negative for dysuria, flank pain, frequency, hematuria and urgency.  Musculoskeletal: Negative for back pain, joint pain and myalgias.  Skin: Negative for rash.  Neurological:  Negative for dizziness, tingling, focal weakness, seizures, weakness and headaches.  Endo/Heme/Allergies: Does  not bruise/bleed easily.  Psychiatric/Behavioral: Negative for depression and suicidal ideas. The patient does not have insomnia.       No Known Allergies   Past Medical History:  Diagnosis Date  . Cancer [facility])      Past Surgical History:  Procedure Laterality Date  . COLOSTOMY REVISION N/A 12/06/2017   Procedure: COLON RESECTION RIGHT;  Surgeon: Florene Glen, MD;  Location: [facility] ORS;  Service: General;  Laterality: N/A;  . DILATION AND CURETTAGE, DIAGNOSTIC / THERAPEUTIC    . PORTACATH PLACEMENT N/A 01/06/2018   Procedure: INSERTION PORT-A-CATH;  Surgeon: Vickie Epley, MD;  Location: [facility] ORS;  Service: General;  Laterality: N/A;    Social History   Socioeconomic History  . Marital status: Widowed    Spouse name: Not on file  . Number of children: Not on file  . Years of education: Not on file  . Highest education level: Not on file  Occupational History  . Not on file  Social Needs  . Financial resource strain: Patient refused  . Food insecurity:    Worry: Patient refused    Inability: Patient refused  . Transportation needs:    Medical: Patient refused    Non-medical: Patient refused  Tobacco Use  . Smoking status: Former Research scientist (life sciences)  . Smokeless tobacco: Never Used  Substance and Sexual Activity  . Alcohol use: No  . Drug use: No  . Sexual activity: Never  Lifestyle  . Physical activity:    Days per week: Patient refused    Minutes per session: Patient refused  . Stress: Patient refused  Relationships  . Social connections:    Talks on phone: Patient refused    Gets together: Patient refused    Attends religious service: Patient refused    Active member of club or organization: Patient refused    Attends meetings of clubs or organizations: Patient refused    Relationship status: Patient refused  . Intimate partner violence:    Fear of  current or ex partner: Patient refused    Emotionally abused: Patient refused    Physically abused: Patient refused    Forced sexual activity: Patient refused  Other Topics Concern  . Not on file  Social History Narrative  . Not on file    Family History  Problem Relation Age of Onset  . ALS Mother   . ALS Father      Current Outpatient Medications:  .  alendronate (FOSAMAX) 70 MG tablet, Take 70 mg by mouth once a week., Disp: , Rfl:  .  amLODipine (NORVASC) 10 MG tablet, Take 10 mg by mouth daily., Disp: , Rfl:  .  ARIPiprazole (ABILIFY) 5 MG tablet, Take 5 mg by mouth at bedtime. , Disp: , Rfl:  .  benztropine (COGENTIN) 0.5 MG tablet, Take 0.5 mg by mouth at bedtime., Disp: , Rfl:  .  Calcium 600-200 MG-UNIT tablet, Take 1 tablet by mouth daily., Disp: , Rfl:  .  calcium carbonate (TUMS - DOSED IN MG ELEMENTAL CALCIUM) 500 MG chewable tablet, Chew 1 tablet by mouth 2 (two) times daily as needed for indigestion or heartburn., Disp: , Rfl:  .  divalproex (DEPAKOTE) 500 MG DR tablet, Take 500 mg by mouth at bedtime. , Disp: , Rfl:  .  loratadine (CLARITIN) 10 MG tablet, Take 1 tablet (10 mg total) by mouth daily as needed for allergies. Take 1 tablet the day before pump removal, 1 tablet the day of pump removal and 1 tablet the day  after pump removal. Take as directed with each cycle of  chemotherapy treatment, Disp: 30 tablet, Rfl: 1 .  memantine (NAMENDA XR) 14 MG CP24 24 hr capsule, Take 14 mg by mouth at bedtime. , Disp: , Rfl:  .  metoprolol succinate (TOPROL-XL) 50 MG 24 hr tablet, Take 50 mg by mouth every morning. Take with or immediately following a meal. , Disp: , Rfl:  .  perphenazine (TRILAFON) 4 MG tablet, Take 4 mg by mouth 2 (two) times daily., Disp: , Rfl:  .  acetaminophen (TYLENOL) 500 MG tablet, Take 1,000 mg by mouth every 8 (eight) hours as needed for mild pain., Disp: , Rfl:  .  dexamethasone (DECADRON) 4 MG tablet, Take 1 tablet (4 mg total) by mouth 2 (two)  times daily with a meal. Starting the day after chemotherapy for 2 days (Patient not taking: Reported on 04/24/2018), Disp: 30 tablet, Rfl: 0 .  lidocaine-prilocaine (EMLA) cream, Apply 1 application topically as needed. 1 hour prior to  each chemotherapy treatment. Place small amount over port site and place saran wrap over the cream to protect clothing (Patient not taking: Reported on 07/03/2018), Disp: 30 g, Rfl: 0 .  Multiple Vitamin (MULTIVITAMIN WITH MINERALS) TABS tablet, Take 1 tablet by mouth daily. (Patient not taking: Reported on 05/15/2018), Disp: 30 tablet, Rfl: 0 .  ondansetron (ZOFRAN) 8 MG tablet, Take 1 tablet (8 mg total) by mouth 2 (two) times daily as needed for nausea or vomiting. Can start using on day 3 after chemo (Patient not taking: Reported on 04/24/2018), Disp: 20 tablet, Rfl: 0 .  prochlorperazine (COMPAZINE) 10 MG tablet, Take 1 tablet (10 mg total) by mouth every 6 (six) hours as needed for nausea or vomiting. (Patient not taking: Reported on 04/24/2018), Disp: 30 tablet, Rfl: 0 No current facility-administered medications for this visit.   Facility-Administered Medications Ordered in Other Visits:  .  dextrose 5 % solution, , Intravenous, Once, Randa Evens C, MD .  heparin lock flush 100 unit/mL, 500 Units, Intravenous, Once, Sindy Guadeloupe, MD .  heparin lock flush 100 unit/mL, 500 Units, Intracatheter, Once PRN, Sindy Guadeloupe, MD .  heparin lock flush 100 unit/mL, 500 Units, Intravenous, Once, Sindy Guadeloupe, MD .  sodium chloride flush (NS) 0.9 % injection 10 mL, 10 mL, Intravenous, PRN, Sindy Guadeloupe, MD, 10 mL at 02/06/18 0845 .  sodium chloride flush (NS) 0.9 % injection 10 mL, 10 mL, Intracatheter, PRN, Sindy Guadeloupe, MD .  sodium chloride flush (NS) 0.9 % injection 10 mL, 10 mL, Intracatheter, PRN, Sindy Guadeloupe, MD, 10 mL at 03/06/18 0900 .  sodium chloride flush (NS) 0.9 % injection 10 mL, 10 mL, Intravenous, PRN, Sindy Guadeloupe, MD, 10 mL at 03/27/18  0909  Physical exam:  Vitals:   07/03/18 0911  BP: 136/60  Pulse: (!) 56  Resp: 18  Temp: (!) 97.4 F (36.3 C)  TempSrc: Tympanic  SpO2: 100%  Weight: 192 lb 0.3 oz (87.1 kg)  Height: _0  (1.651 m)   Physical Exam  Constitutional: She is oriented to person, place, and time. She appears well-developed and well-nourished.  HENT:  Head: Normocephalic and atraumatic.  Eyes: Pupils are equal, round, and reactive to light. EOM are normal.  Neck: Normal range of motion.  Cardiovascular: Normal rate, regular rhythm and normal heart sounds.  Pulmonary/Chest: Effort normal and breath sounds normal.  Abdominal: Soft. Bowel sounds are normal.  Musculoskeletal: She exhibits edema (Bilateral ankle edema).  Neurological: She is alert and oriented to person, place, and time.  Skin: Skin is warm and dry.     CMP Latest Ref Rng & Units 07/03/2018  Glucose 70 - 99 mg/dL 113(H)  BUN 8 - 23 mg/dL 10  Creatinine 0.44 - 1.00 mg/dL 0.81  Sodium 135 - 145 mmol/L 133(L)  Potassium 3.5 - 5.1 mmol/L 3.9  Chloride 98 - 111 mmol/L 99  CO2 22 - 32 mmol/L 21(L)  Calcium 8.9 - 10.3 mg/dL 9.1  Total Protein 6.5 - 8.1 g/dL 6.9  Total Bilirubin 0.3 - 1.2 mg/dL 0.8  Alkaline Phos 38 - 126 U/L 101  AST 15 - 41 U/L 28  ALT 0 - 44 U/L 20   CBC Latest Ref Rng & Units 07/03/2018  WBC 3.6 - 11.0 K/uL 6.5  Hemoglobin 12.0 - 16.0 g/dL 13.6  Hematocrit 35.0 - 47.0 % 40.0  Platelets 150 - 440 K/uL 127(L)     Assessment and plan- Patient is a 72 y.o. female at least stage IIIC pT4b pN2aMxstatus post hemicolectomy(probably stage IV given peritoneal nodularity).   She is here for on treatment assessment prior to cycle 10 of 5-FU Avastin chemotherapy  Patient is mild chemo induced thrombocytopenia but counts are otherwise okay to proceed with cycle 10 of 5-FU a Avastin chemotherapy today.  Her blood pressure is stable and her urine protein is negative.  I will see her back in 2 weeks for cycle 11 of  chemotherapy with CBC and CMP.  Her CEA has always been normal.  I will plan to get scans after 12 cycles   Visit Diagnosis 1. Colon adenocarcinoma ([place])   2. Encounter for antineoplastic chemotherapy   3. Encounter for monoclonal antibody treatment for malignancy      Dr. Randa Evens, MD, MPH [facility] at [facility] [phone] 07/03/2018 4:11 PM

## 2018-07-05 ENCOUNTER — Other Ambulatory Visit: Payer: Self-pay | Admitting: Urgent Care

## 2018-07-05 ENCOUNTER — Inpatient Hospital Stay: Payer: Medicare Other

## 2018-07-05 VITALS — BP 142/81 | HR 61 | Temp 96.1°F | Resp 18

## 2018-07-05 DIAGNOSIS — Z5111 Encounter for antineoplastic chemotherapy: Secondary | ICD-10-CM | POA: Diagnosis not present

## 2018-07-05 DIAGNOSIS — C189 Malignant neoplasm of colon, unspecified: Secondary | ICD-10-CM

## 2018-07-05 MED ORDER — SODIUM CHLORIDE 0.9% FLUSH
10.0000 mL | INTRAVENOUS | Status: DC | PRN
Start: 2018-07-05 — End: 2018-07-05
  Administered 2018-07-05: 10 mL
  Filled 2018-07-05: qty 10

## 2018-07-05 MED ORDER — PEGFILGRASTIM INJECTION 6 MG/0.6ML ~~LOC~~
6.0000 mg | PREFILLED_SYRINGE | Freq: Once | SUBCUTANEOUS | Status: AC
  Administered 2018-07-05: 6 mg via SUBCUTANEOUS

## 2018-07-05 MED ORDER — HEPARIN SOD (PORK) LOCK FLUSH 100 UNIT/ML IV SOLN
500.0000 [IU] | Freq: Once | INTRAVENOUS | Status: AC | PRN
  Administered 2018-07-05: 500 [IU]

## 2018-07-17 ENCOUNTER — Encounter: Payer: Self-pay | Admitting: Oncology

## 2018-07-17 ENCOUNTER — Inpatient Hospital Stay: Payer: Medicare Other

## 2018-07-17 ENCOUNTER — Inpatient Hospital Stay (HOSPITAL_BASED_OUTPATIENT_CLINIC_OR_DEPARTMENT_OTHER): Payer: Medicare Other | Admitting: Oncology

## 2018-07-17 VITALS — BP 139/80 | HR 60 | Temp 97.6°F | Resp 18 | Ht 65.0 in | Wt 191.8 lb

## 2018-07-17 DIAGNOSIS — C189 Malignant neoplasm of colon, unspecified: Secondary | ICD-10-CM

## 2018-07-17 DIAGNOSIS — R911 Solitary pulmonary nodule: Secondary | ICD-10-CM

## 2018-07-17 DIAGNOSIS — C182 Malignant neoplasm of ascending colon: Secondary | ICD-10-CM

## 2018-07-17 DIAGNOSIS — Z5111 Encounter for antineoplastic chemotherapy: Secondary | ICD-10-CM | POA: Diagnosis not present

## 2018-07-17 DIAGNOSIS — Z87891 Personal history of nicotine dependence: Secondary | ICD-10-CM | POA: Diagnosis not present

## 2018-07-17 DIAGNOSIS — Z9049 Acquired absence of other specified parts of digestive tract: Secondary | ICD-10-CM

## 2018-07-17 DIAGNOSIS — D6959 Other secondary thrombocytopenia: Secondary | ICD-10-CM | POA: Diagnosis not present

## 2018-07-17 DIAGNOSIS — Z79899 Other long term (current) drug therapy: Secondary | ICD-10-CM

## 2018-07-17 LAB — CBC WITH DIFFERENTIAL/PLATELET
Basophils Absolute: 0.1 10*3/uL (ref 0–0.1)
Basophils Relative: 1 %
Eosinophils Absolute: 0.1 10*3/uL (ref 0–0.7)
Eosinophils Relative: 1 %
HEMATOCRIT: 38.4 % (ref 35.0–47.0)
Hemoglobin: 13.1 g/dL (ref 12.0–16.0)
LYMPHS ABS: 1.8 10*3/uL (ref 1.0–3.6)
Lymphocytes Relative: 19 %
MCH: 34.4 pg — AB (ref 26.0–34.0)
MCHC: 34.1 g/dL (ref 32.0–36.0)
MCV: 100.9 fL — AB (ref 80.0–100.0)
MONO ABS: 1 10*3/uL — AB (ref 0.2–0.9)
MONOS PCT: 10 %
Neutro Abs: 6.7 10*3/uL — ABNORMAL HIGH (ref 1.4–6.5)
Neutrophils Relative %: 69 %
PLATELETS: 114 10*3/uL — AB (ref 150–440)
RBC: 3.8 MIL/uL (ref 3.80–5.20)
RDW: 18.1 % — AB (ref 11.5–14.5)
WBC: 9.7 10*3/uL (ref 3.6–11.0)

## 2018-07-17 LAB — COMPREHENSIVE METABOLIC PANEL
ALBUMIN: 3.9 g/dL (ref 3.5–5.0)
ALT: 17 U/L (ref 0–44)
AST: 27 U/L (ref 15–41)
Alkaline Phosphatase: 110 U/L (ref 38–126)
Anion gap: 12 (ref 5–15)
BILIRUBIN TOTAL: 0.8 mg/dL (ref 0.3–1.2)
BUN: 10 mg/dL (ref 8–23)
CHLORIDE: 97 mmol/L — AB (ref 98–111)
CO2: 21 mmol/L — AB (ref 22–32)
Calcium: 9.1 mg/dL (ref 8.9–10.3)
Creatinine, Ser: 0.72 mg/dL (ref 0.44–1.00)
GFR calc Af Amer: >60 mL/min (ref >60)
GFR calc non Af Amer: >60 mL/min (ref >60)
GLUCOSE: 92 mg/dL (ref 70–99)
POTASSIUM: 3.7 mmol/L (ref 3.5–5.1)
SODIUM: 130 mmol/L — AB (ref 135–145)
TOTAL PROTEIN: 6.7 g/dL (ref 6.5–8.1)

## 2018-07-17 LAB — PROTEIN, URINE, RANDOM: TOTAL PROTEIN, URINE: NEGATIVE mg/dL

## 2018-07-17 MED ORDER — PALONOSETRON HCL INJECTION 0.25 MG/5ML
0.2500 mg | Freq: Once | INTRAVENOUS | Status: AC
  Administered 2018-07-17: 0.25 mg via INTRAVENOUS
  Filled 2018-07-17: qty 5

## 2018-07-17 MED ORDER — SODIUM CHLORIDE 0.9 % IV SOLN
2400.0000 mg/m2 | INTRAVENOUS | Status: DC
  Administered 2018-07-17: 4800 mg via INTRAVENOUS
  Filled 2018-07-17: qty 96

## 2018-07-17 MED ORDER — SODIUM CHLORIDE 0.9 % IV SOLN
900.0000 mg | Freq: Once | INTRAVENOUS | Status: AC
  Administered 2018-07-17: 900 mg via INTRAVENOUS
  Filled 2018-07-17: qty 4

## 2018-07-17 MED ORDER — SODIUM CHLORIDE 0.9 % IV SOLN
Freq: Once | INTRAVENOUS | Status: AC
  Administered 2018-07-17: 11:00:00 via INTRAVENOUS
  Filled 2018-07-17: qty 250

## 2018-07-17 MED ORDER — SODIUM CHLORIDE 0.9% FLUSH
10.0000 mL | INTRAVENOUS | Status: DC | PRN
  Administered 2018-07-17: 10 mL via INTRAVENOUS
  Filled 2018-07-17: qty 10

## 2018-07-17 MED ORDER — FLUOROURACIL CHEMO INJECTION 2.5 GM/50ML
400.0000 mg/m2 | Freq: Once | INTRAVENOUS | Status: AC
  Administered 2018-07-17: 800 mg via INTRAVENOUS
  Filled 2018-07-17: qty 16

## 2018-07-17 MED ORDER — LEUCOVORIN CALCIUM INJECTION 100 MG
20.0000 mg/m2 | Freq: Once | INTRAMUSCULAR | Status: AC
  Administered 2018-07-17: 40 mg via INTRAVENOUS
  Filled 2018-07-17: qty 2

## 2018-07-17 MED ORDER — DEXAMETHASONE SODIUM PHOSPHATE 10 MG/ML IJ SOLN
10.0000 mg | Freq: Once | INTRAMUSCULAR | Status: AC
  Administered 2018-07-17: 10 mg via INTRAVENOUS
  Filled 2018-07-17: qty 1

## 2018-07-17 MED ORDER — DEXTROSE 5 % IV SOLN
Freq: Once | INTRAVENOUS | Status: AC
  Administered 2018-07-17: 12:00:00 via INTRAVENOUS
  Filled 2018-07-17: qty 250

## 2018-07-17 MED ORDER — LEUCOVORIN CALCIUM INJECTION 350 MG
800.0000 mg | Freq: Once | INTRAVENOUS | Status: DC
  Filled 2018-07-17: qty 40

## 2018-07-17 NOTE — Progress Notes (Signed)
No new changes noted today 

## 2018-07-17 NOTE — Progress Notes (Signed)
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 She presented to the ER with symptoms of dizziness mostly when she stands up. She has had problems with constipation in the past and takes prune juice to relieve it. Patient is a poor historian at baseline and complained of abdominal pain in ER which led to CT abdomen. 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  CT chest without contrast showed tiny 2 mm right upper lobe pulmonary nodule. No other evidence of metastatic disease. Baseline CEA elevated at 4.8  Patient underwent exploratory laparotomy with right hemicolectomy and terminal ileum resection as well as separate small bowel resection on 12/06/2017. Operative findings showed a large cecal tumor with extramural spread to retroperitoneum mesentery, mesentery lymph nodes and parietal peritoneum. Dense inflammatory response of the retroperitoneum and small bowel.There was a second portion of small bowel that was involved and was resected. A small portion of free tumor was identified in the retroperitoneum which was sent off for examination as well.  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 Radial margin positive for invasive carcinoma 2 colon adenomas 2.5  and 1.8 cm of the ascending colon. Metastatic adenocarcinoma in 4 out of 13 lymph nodes. 2 tumor deposits Segment of small intestine with abscess which also showed adenocarcinoma invading the distal appendix with perforation. One mesenteric lymph node negative for malignancy. Retroperitoneal tumor removal was also positive for mucinous adenocarcinoma. Tumor was grade 2. Proximal and distal margins were negative.pT4bpN2a. MSI stable. KRAS mutation positive  Case discussed at tumor board. PET CT will not be done at this time as it may represent post surgical changes. Radiation oncology does not recommend adjuvant RT upon completion of chemotherapy.she was found to have recurrent small bowel soft tissue mass and peritoneal nodularity after 5 cycles of folfox. FOLFOX avastin to be given with a palliative intent. oxaliplatin discontinued after 5 cycles due to progressive thrombocytopenia requiring treatment interruptions  Interval history-patient had one episode of confusion at her group night she received her last chemotherapy.  However it resolved the next morning.  Other than that she is doing well and she reports no fatigue, tingling numbness in her extremities.  She has occasional constipation which is controlled with bowel medications  ECOG PS- 1 Pain scale- 0 Opioid associated constipation- no  Review of systems- Review of Systems  Constitutional: Negative for chills, fever, malaise/fatigue and weight loss.  HENT: Negative for congestion, ear discharge and nosebleeds.   Eyes: Negative for blurred vision.  Respiratory: Negative for cough, hemoptysis, sputum production, shortness of breath and wheezing.   Cardiovascular: Negative for chest pain, palpitations, orthopnea and claudication.  Gastrointestinal: Negative for abdominal pain, blood in stool, constipation, diarrhea, heartburn, melena, nausea and vomiting.  Genitourinary: Negative for dysuria, flank pain, frequency, hematuria and  urgency.  Musculoskeletal: Negative for back pain, joint pain  and myalgias.  Skin: Negative for rash.  Neurological: Negative for dizziness, tingling, focal weakness, seizures, weakness and headaches.  Endo/Heme/Allergies: Does not bruise/bleed easily.  Psychiatric/Behavioral: Negative for depression and suicidal ideas. The patient does not have insomnia.      No Known Allergies   Past Medical History:  Diagnosis Date  . Cancer [facility])      Past Surgical History:  Procedure Laterality Date  . COLOSTOMY REVISION N/A 12/06/2017   Procedure: COLON RESECTION RIGHT;  Surgeon: Florene Glen, MD;  Location: [facility] ORS;  Service: General;  Laterality: N/A;  . DILATION AND CURETTAGE, DIAGNOSTIC / THERAPEUTIC    . PORTACATH PLACEMENT N/A 01/06/2018   Procedure: INSERTION PORT-A-CATH;  Surgeon: Vickie Epley, MD;  Location: [facility] ORS;  Service: General;  Laterality: N/A;    Social History   Socioeconomic History  . Marital status: Widowed    Spouse name: Not on file  . Number of children: Not on file  . Years of education: Not on file  . Highest education level: Not on file  Occupational History  . Not on file  Social Needs  . Financial resource strain: Patient refused  . Food insecurity:    Worry: Patient refused    Inability: Patient refused  . Transportation needs:    Medical: Patient refused    Non-medical: Patient refused  Tobacco Use  . Smoking status: Former Research scientist (life sciences)  . Smokeless tobacco: Never Used  Substance and Sexual Activity  . Alcohol use: No  . Drug use: No  . Sexual activity: Never  Lifestyle  . Physical activity:    Days per week: Patient refused    Minutes per session: Patient refused  . Stress: Patient refused  Relationships  . Social connections:    Talks on phone: Patient refused    Gets together: Patient refused    Attends religious service: Patient refused    Active member of club or organization: Patient refused    Attends meetings of clubs or  organizations: Patient refused    Relationship status: Patient refused  . Intimate partner violence:    Fear of current or ex partner: Patient refused    Emotionally abused: Patient refused    Physically abused: Patient refused    Forced sexual activity: Patient refused  Other Topics Concern  . Not on file  Social History Narrative  . Not on file    Family History  Problem Relation Age of Onset  . ALS Mother   . ALS Father      Current Outpatient Medications:  .  acetaminophen (TYLENOL) 500 MG tablet, Take 1,000 mg by mouth every 8 (eight) hours as needed for mild pain., Disp: , Rfl:  .  alendronate (FOSAMAX) 70 MG tablet, Take 70 mg by mouth once a week., Disp: , Rfl:  .  amLODipine (NORVASC) 10 MG tablet, Take 10 mg by mouth daily., Disp: , Rfl:  .  ARIPiprazole (ABILIFY) 5 MG tablet, Take 5 mg by mouth at bedtime. , Disp: , Rfl:  .  benztropine (COGENTIN) 0.5 MG tablet, Take 0.5 mg by mouth at bedtime., Disp: , Rfl:  .  Calcium 600-200 MG-UNIT tablet, Take 1 tablet by mouth daily., Disp: , Rfl:  .  calcium carbonate (TUMS - DOSED IN MG ELEMENTAL CALCIUM) 500 MG chewable tablet, Chew 1 tablet by mouth 2 (two) times daily as needed for indigestion or heartburn., Disp: , Rfl:  .  divalproex (DEPAKOTE) 500 MG DR tablet, Take 500 mg by mouth at  bedtime. , Disp: , Rfl:  .  loratadine (CLARITIN) 10 MG tablet, Take 1 tablet (10 mg total) by mouth daily as needed for allergies. Take 1 tablet the day before pump removal, 1 tablet the day of pump removal and 1 tablet the day after pump removal. Take as directed with each cycle of  chemotherapy treatment, Disp: 30 tablet, Rfl: 1 .  memantine (NAMENDA XR) 14 MG CP24 24 hr capsule, Take 14 mg by mouth at bedtime. , Disp: , Rfl:  .  metoprolol succinate (TOPROL-XL) 50 MG 24 hr tablet, Take 50 mg by mouth every morning. Take with or immediately following a meal. , Disp: , Rfl:  .  perphenazine (TRILAFON) 4 MG tablet, Take 4 mg by mouth 2 (two)  times daily., Disp: , Rfl:  .  dexamethasone (DECADRON) 4 MG tablet, Take 1 tablet (4 mg total) by mouth 2 (two) times daily with a meal. Starting the day after chemotherapy for 2 days (Patient not taking: Reported on 04/24/2018), Disp: 30 tablet, Rfl: 0 .  lidocaine-prilocaine (EMLA) cream, Apply 1 application topically as needed. 1 hour prior to  each chemotherapy treatment. Place small amount over port site and place saran wrap over the cream to protect clothing (Patient not taking: Reported on 07/03/2018), Disp: 30 g, Rfl: 0 .  Multiple Vitamin (MULTIVITAMIN WITH MINERALS) TABS tablet, Take 1 tablet by mouth daily. (Patient not taking: Reported on 05/15/2018), Disp: 30 tablet, Rfl: 0 .  ondansetron (ZOFRAN) 8 MG tablet, Take 1 tablet (8 mg total) by mouth 2 (two) times daily as needed for nausea or vomiting. Can start using on day 3 after chemo (Patient not taking: Reported on 04/24/2018), Disp: 20 tablet, Rfl: 0 .  prochlorperazine (COMPAZINE) 10 MG tablet, Take 1 tablet (10 mg total) by mouth every 6 (six) hours as needed for nausea or vomiting. (Patient not taking: Reported on 04/24/2018), Disp: 30 tablet, Rfl: 0 No current facility-administered medications for this visit.   Facility-Administered Medications Ordered in Other Visits:  .  dextrose 5 % solution, , Intravenous, Once, Sindy Guadeloupe, MD .  fluorouracil (ADRUCIL) 4,800 mg in sodium chloride 0.9 % 54 mL chemo infusion, 2,400 mg/m2 (Treatment Plan Recorded), Intravenous, 1 day or 1 dose, Sindy Guadeloupe, MD, 4,800 mg at 07/17/18 1200 .  heparin lock flush 100 unit/mL, 500 Units, Intravenous, Once, Randa Evens C, MD .  heparin lock flush 100 unit/mL, 500 Units, Intracatheter, Once PRN, Sindy Guadeloupe, MD .  heparin lock flush 100 unit/mL, 500 Units, Intravenous, Once, Sindy Guadeloupe, MD .  sodium chloride flush (NS) 0.9 % injection 10 mL, 10 mL, Intravenous, PRN, Sindy Guadeloupe, MD, 10 mL at 02/06/18 0845 .  sodium chloride flush (NS) 0.9 %  injection 10 mL, 10 mL, Intracatheter, PRN, Sindy Guadeloupe, MD .  sodium chloride flush (NS) 0.9 % injection 10 mL, 10 mL, Intracatheter, PRN, Sindy Guadeloupe, MD, 10 mL at 03/06/18 0900 .  sodium chloride flush (NS) 0.9 % injection 10 mL, 10 mL, Intravenous, PRN, Sindy Guadeloupe, MD, 10 mL at 03/27/18 0909 .  sodium chloride flush (NS) 0.9 % injection 10 mL, 10 mL, Intravenous, PRN, Sindy Guadeloupe, MD, 10 mL at 07/17/18 0924  Physical exam:  Vitals:   07/17/18 0952  BP: 139/80  Pulse: 60  Resp: 18  Temp: 97.6 F (36.4 C)  TempSrc: Tympanic  Weight: 191 lb 12.8 oz (87 kg)  Height: '5\' 5"'  (1.651 m)   Physical  Exam  Constitutional: She is oriented to person, place, and time.  She is obese.  Does not appear to be in any acute distress  HENT:  Head: Normocephalic and atraumatic.  Eyes: Pupils are equal, round, and reactive to light. EOM are normal.  Neck: Normal range of motion.  Cardiovascular: Normal rate, regular rhythm and normal heart sounds.  Pulmonary/Chest: Effort normal and breath sounds normal.  Abdominal: Soft. Bowel sounds are normal.  Obese  Musculoskeletal: She exhibits edema (Bilateral ankle chronic).  Neurological: She is alert and oriented to person, place, and time.  Skin: Skin is warm and dry.     CMP Latest Ref Rng & Units 07/17/2018  Glucose 70 - 99 mg/dL 92  BUN 8 - 23 mg/dL 10  Creatinine 0.44 - 1.00 mg/dL 0.72  Sodium 135 - 145 mmol/L 130(L)  Potassium 3.5 - 5.1 mmol/L 3.7  Chloride 98 - 111 mmol/L 97(L)  CO2 22 - 32 mmol/L 21(L)  Calcium 8.9 - 10.3 mg/dL 9.1  Total Protein 6.5 - 8.1 g/dL 6.7  Total Bilirubin 0.3 - 1.2 mg/dL 0.8  Alkaline Phos 38 - 126 U/L 110  AST 15 - 41 U/L 27  ALT 0 - 44 U/L 17   CBC Latest Ref Rng & Units 07/17/2018  WBC 3.6 - 11.0 K/uL 9.7  Hemoglobin 12.0 - 16.0 g/dL 13.1  Hematocrit 35.0 - 47.0 % 38.4  Platelets 150 - 440 K/uL 114(L)      Assessment and plan- Patient is a 72 y.o. female at least stage IIIC pT4b  pN2aMxstatus post hemicolectomy(probably stage IV given peritoneal nodularity). She is here for on treatment assessment prior to cycle 11 of 5-FU and Avastin chemotherapy  Patient's blood pressure is stable and urine shows no protein.  She can proceed with a Avastin along with 5-FU chemotherapy today.  Oxaliplatin has been on hold because of persistent thrombocytopenia requiring multiple dose interruptions.  I will plan to get a repeat CT chest abdomen and pelvis prior to the next cycle of treatment and I will see her back in 2 weeks with CBC CMP for cycle 12 of 5-FU and Avastin chemotherapy   Visit Diagnosis 1. Colon adenocarcinoma ([place])   2. Encounter for antineoplastic chemotherapy      Dr. Randa Evens, MD, MPH [facility] [phone] 07/17/2018 12:50 PM

## 2018-07-19 ENCOUNTER — Inpatient Hospital Stay: Payer: Medicare Other

## 2018-07-19 VITALS — BP 131/82 | HR 55 | Temp 95.8°F | Resp 18

## 2018-07-19 DIAGNOSIS — C189 Malignant neoplasm of colon, unspecified: Secondary | ICD-10-CM

## 2018-07-19 DIAGNOSIS — Z5111 Encounter for antineoplastic chemotherapy: Secondary | ICD-10-CM | POA: Diagnosis not present

## 2018-07-19 MED ORDER — HEPARIN SOD (PORK) LOCK FLUSH 100 UNIT/ML IV SOLN
500.0000 [IU] | Freq: Once | INTRAVENOUS | Status: AC | PRN
  Administered 2018-07-19: 500 [IU]

## 2018-07-19 MED ORDER — SODIUM CHLORIDE 0.9% FLUSH
10.0000 mL | INTRAVENOUS | Status: DC | PRN
  Administered 2018-07-19: 10 mL
  Filled 2018-07-19: qty 10

## 2018-07-19 MED ORDER — PEGFILGRASTIM INJECTION 6 MG/0.6ML ~~LOC~~
6.0000 mg | PREFILLED_SYRINGE | Freq: Once | SUBCUTANEOUS | Status: AC
  Administered 2018-07-19: 6 mg via SUBCUTANEOUS

## 2018-07-27 ENCOUNTER — Ambulatory Visit
Admission: RE | Admit: 2018-07-27 | Discharge: 2018-07-27 | Disposition: A | Discharge status: Home / Self Care | Payer: Medicare Other | Source: Ambulatory Visit | Attending: Oncology | Admitting: Oncology

## 2018-07-27 DIAGNOSIS — R1909 Other intra-abdominal and pelvic swelling, mass and lump: Secondary | ICD-10-CM | POA: Insufficient documentation

## 2018-07-27 DIAGNOSIS — C189 Malignant neoplasm of colon, unspecified: Secondary | ICD-10-CM | POA: Insufficient documentation

## 2018-07-27 DIAGNOSIS — K7689 Other specified diseases of liver: Secondary | ICD-10-CM | POA: Insufficient documentation

## 2018-07-27 DIAGNOSIS — R918 Other nonspecific abnormal finding of lung field: Secondary | ICD-10-CM | POA: Diagnosis not present

## 2018-07-27 MED ORDER — IOPAMIDOL (ISOVUE-300) INJECTION 61%
100.0000 mL | Freq: Once | INTRAVENOUS | Status: AC | PRN
  Administered 2018-07-27: 100 mL via INTRAVENOUS

## 2018-07-28 ENCOUNTER — Other Ambulatory Visit: Payer: Self-pay | Admitting: *Deleted

## 2018-07-28 DIAGNOSIS — C189 Malignant neoplasm of colon, unspecified: Secondary | ICD-10-CM

## 2018-07-31 ENCOUNTER — Ambulatory Visit: Payer: Medicare Other | Admitting: Oncology

## 2018-07-31 ENCOUNTER — Inpatient Hospital Stay: Payer: Medicare Other

## 2018-07-31 ENCOUNTER — Encounter: Payer: Self-pay | Admitting: Oncology

## 2018-07-31 ENCOUNTER — Ambulatory Visit: Payer: Medicare Other

## 2018-07-31 ENCOUNTER — Other Ambulatory Visit: Payer: Medicare Other

## 2018-07-31 ENCOUNTER — Inpatient Hospital Stay: Payer: Medicare Other | Attending: Oncology | Admitting: Oncology

## 2018-07-31 VITALS — BP 135/76 | HR 72

## 2018-07-31 VITALS — BP 142/75 | HR 63 | Temp 97.6°F | Resp 18 | Ht 65.0 in | Wt 190.5 lb

## 2018-07-31 DIAGNOSIS — C785 Secondary malignant neoplasm of large intestine and rectum: Secondary | ICD-10-CM | POA: Diagnosis not present

## 2018-07-31 DIAGNOSIS — C786 Secondary malignant neoplasm of retroperitoneum and peritoneum: Secondary | ICD-10-CM | POA: Diagnosis not present

## 2018-07-31 DIAGNOSIS — C787 Secondary malignant neoplasm of liver and intrahepatic bile duct: Secondary | ICD-10-CM | POA: Diagnosis not present

## 2018-07-31 DIAGNOSIS — Z87891 Personal history of nicotine dependence: Secondary | ICD-10-CM | POA: Diagnosis not present

## 2018-07-31 DIAGNOSIS — Z9049 Acquired absence of other specified parts of digestive tract: Secondary | ICD-10-CM | POA: Diagnosis not present

## 2018-07-31 DIAGNOSIS — E878 Other disorders of electrolyte and fluid balance, not elsewhere classified: Secondary | ICD-10-CM | POA: Insufficient documentation

## 2018-07-31 DIAGNOSIS — R911 Solitary pulmonary nodule: Secondary | ICD-10-CM | POA: Insufficient documentation

## 2018-07-31 DIAGNOSIS — C189 Malignant neoplasm of colon, unspecified: Secondary | ICD-10-CM

## 2018-07-31 DIAGNOSIS — Z79899 Other long term (current) drug therapy: Secondary | ICD-10-CM | POA: Diagnosis not present

## 2018-07-31 DIAGNOSIS — Z5111 Encounter for antineoplastic chemotherapy: Secondary | ICD-10-CM | POA: Diagnosis present

## 2018-07-31 DIAGNOSIS — D6959 Other secondary thrombocytopenia: Secondary | ICD-10-CM | POA: Diagnosis not present

## 2018-07-31 DIAGNOSIS — C182 Malignant neoplasm of ascending colon: Secondary | ICD-10-CM | POA: Diagnosis not present

## 2018-07-31 DIAGNOSIS — Z5112 Encounter for antineoplastic immunotherapy: Secondary | ICD-10-CM

## 2018-07-31 DIAGNOSIS — E871 Hypo-osmolality and hyponatremia: Secondary | ICD-10-CM | POA: Insufficient documentation

## 2018-07-31 LAB — CBC WITH DIFFERENTIAL/PLATELET
BASOS ABS: 0.1 10*3/uL (ref 0–0.1)
Basophils Relative: 1 %
Eosinophils Absolute: 0.1 10*3/uL (ref 0–0.7)
Eosinophils Relative: 1 %
HEMATOCRIT: 38 % (ref 35.0–47.0)
Hemoglobin: 12.9 g/dL (ref 12.0–16.0)
LYMPHS PCT: 17 %
Lymphs Abs: 1.3 10*3/uL (ref 1.0–3.6)
MCH: 34.3 pg — ABNORMAL HIGH (ref 26.0–34.0)
MCHC: 34.1 g/dL (ref 32.0–36.0)
MCV: 100.8 fL — AB (ref 80.0–100.0)
MONO ABS: 0.9 10*3/uL (ref 0.2–0.9)
MONOS PCT: 12 %
Neutro Abs: 5.2 10*3/uL (ref 1.4–6.5)
Neutrophils Relative %: 69 %
PLATELETS: 111 10*3/uL — AB (ref 150–440)
RBC: 3.77 MIL/uL — ABNORMAL LOW (ref 3.80–5.20)
RDW: 19 % — ABNORMAL HIGH (ref 11.5–14.5)
WBC: 7.5 10*3/uL (ref 3.6–11.0)

## 2018-07-31 LAB — COMPREHENSIVE METABOLIC PANEL
ALBUMIN: 3.8 g/dL (ref 3.5–5.0)
ALK PHOS: 110 U/L (ref 38–126)
ALT: 20 U/L (ref 0–44)
AST: 27 U/L (ref 15–41)
Anion gap: 10 (ref 5–15)
BILIRUBIN TOTAL: 1.1 mg/dL (ref 0.3–1.2)
BUN: 8 mg/dL (ref 8–23)
CALCIUM: 8.9 mg/dL (ref 8.9–10.3)
CO2: 21 mmol/L — ABNORMAL LOW (ref 22–32)
Chloride: 96 mmol/L — ABNORMAL LOW (ref 98–111)
Creatinine, Ser: 0.78 mg/dL (ref 0.44–1.00)
GFR calc Af Amer: >60 mL/min (ref >60)
GFR calc non Af Amer: >60 mL/min (ref >60)
GLUCOSE: 105 mg/dL — AB (ref 70–99)
POTASSIUM: 4 mmol/L (ref 3.5–5.1)
SODIUM: 127 mmol/L — AB (ref 135–145)
TOTAL PROTEIN: 6.6 g/dL (ref 6.5–8.1)

## 2018-07-31 MED ORDER — IRINOTECAN HCL CHEMO INJECTION 100 MG/5ML
150.0000 mg/m2 | Freq: Once | INTRAVENOUS | Status: AC
  Administered 2018-07-31: 300 mg via INTRAVENOUS
  Filled 2018-07-31: qty 15

## 2018-07-31 MED ORDER — LEUCOVORIN CALCIUM INJECTION 350 MG
800.0000 mg | Freq: Once | INTRAVENOUS | Status: AC
  Administered 2018-07-31: 800 mg via INTRAVENOUS
  Filled 2018-07-31: qty 5

## 2018-07-31 MED ORDER — DEXAMETHASONE SODIUM PHOSPHATE 10 MG/ML IJ SOLN
10.0000 mg | Freq: Once | INTRAMUSCULAR | Status: AC
  Administered 2018-07-31: 10 mg via INTRAVENOUS
  Filled 2018-07-31: qty 1

## 2018-07-31 MED ORDER — SODIUM CHLORIDE 0.9 % IV SOLN
900.0000 mg | Freq: Once | INTRAVENOUS | Status: AC
  Administered 2018-07-31: 900 mg via INTRAVENOUS
  Filled 2018-07-31: qty 32

## 2018-07-31 MED ORDER — ATROPINE SULFATE 1 MG/ML IJ SOLN
0.5000 mg | Freq: Once | INTRAMUSCULAR | Status: AC
  Administered 2018-07-31: 0.5 mg via INTRAVENOUS
  Filled 2018-07-31: qty 1

## 2018-07-31 MED ORDER — SODIUM CHLORIDE 0.9% FLUSH
10.0000 mL | INTRAVENOUS | Status: DC | PRN
  Administered 2018-07-31: 10 mL via INTRAVENOUS
  Filled 2018-07-31: qty 10

## 2018-07-31 MED ORDER — DEXTROSE 5 % IV SOLN
Freq: Once | INTRAVENOUS | Status: AC
  Administered 2018-07-31: 11:00:00 via INTRAVENOUS
  Filled 2018-07-31: qty 250

## 2018-07-31 MED ORDER — SODIUM CHLORIDE 0.9 % IV SOLN
2400.0000 mg/m2 | INTRAVENOUS | Status: DC
  Administered 2018-07-31: 4800 mg via INTRAVENOUS
  Filled 2018-07-31 (×2): qty 96

## 2018-07-31 MED ORDER — SODIUM CHLORIDE 0.9 % IV SOLN
INTRAVENOUS | Status: AC
  Administered 2018-07-31: 10:00:00 via INTRAVENOUS
  Filled 2018-07-31 (×2): qty 250

## 2018-07-31 MED ORDER — FLUOROURACIL CHEMO INJECTION 2.5 GM/50ML
400.0000 mg/m2 | Freq: Once | INTRAVENOUS | Status: AC
  Administered 2018-07-31: 800 mg via INTRAVENOUS
  Filled 2018-07-31: qty 16

## 2018-07-31 MED ORDER — HEPARIN SOD (PORK) LOCK FLUSH 100 UNIT/ML IV SOLN: 500.0000 [IU] | Freq: Once | INTRAVENOUS | Status: DC

## 2018-07-31 MED ORDER — PALONOSETRON HCL INJECTION 0.25 MG/5ML
0.2500 mg | Freq: Once | INTRAVENOUS | Status: AC
  Administered 2018-07-31: 0.25 mg via INTRAVENOUS
  Filled 2018-07-31: qty 5

## 2018-07-31 MED ORDER — SODIUM CHLORIDE 0.9 % IV SOLN: 10.0000 mg | Freq: Once | INTRAVENOUS | Status: DC

## 2018-07-31 NOTE — Progress Notes (Signed)
Pt feeling good, jsut has a time when she has to give urine sample here at cancer center

## 2018-07-31 NOTE — Progress Notes (Signed)
Per Dr. Janese Banks, patient is to get an extra liter of fluids today and we do not have to get a urine for the Avastin today.  New teaching sheet on irinotecan given to caregiver.

## 2018-08-01 LAB — CEA: CEA: 9.2 ng/mL — ABNORMAL HIGH (ref 0.0–4.7)

## 2018-08-01 NOTE — Progress Notes (Signed)
Hematology/Oncology Consult note [facility]  Telephone:([phone] [phone]  Patient Care Team: Lin Givens, MD as PCP - General (Family Medicine) Clent Jacks, RN as Registered Nurse   Name of the patient: Latoya Jones  [id number]  [birth year]   Date of visit: 08/01/18  Diagnosis- adenocarcinoma of the colon at least stage IIIC pT4b [id number] post hemicolectomy.  Subsequently patient found to have a peritoneal nodule as well as liver metastases and is currently being treated as stage IV colon cancer  Chief complaint/ Reason for visit-on treatment assessment prior to cycle 12 of 5-FU and Avastin chemotherapy.  Irinotecan to be added starting today  Heme/Onc history: Patient is a 72 year old female with a h/o developmental and mood disoredr who resides at a group home. She presented to the ER with symptoms of dizziness mostly when she stands up. She has had problems with constipation in the past and takes prune juice to relieve it. Patient is a poor historian at baseline and complained of abdominal pain in ER which led to CT abdomen. 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  CT chest without contrast showed tiny 2 mm right upper lobe pulmonary nodule. No other evidence of metastatic disease. Baseline CEA elevated at 4.8  Patient underwent exploratory laparotomy with right hemicolectomy and terminal ileum resection as well as separate small bowel resection on 12/06/2017. Operative findings showed a large cecal tumor with extramural spread to retroperitoneum mesentery, mesentery lymph nodes and parietal peritoneum. Dense inflammatory response of the retroperitoneum and small bowel.There was a second portion of small bowel that was involved and was resected. A small portion of free tumor was identified in the retroperitoneum which was sent off for examination as well.  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 Radial margin positive for invasive carcinoma 2 colon adenomas 2.5 and 1.8 cm of the ascending colon. Metastatic adenocarcinoma in 4 out of 13 lymph nodes. 2 tumor deposits Segment of small intestine with abscess which also showed adenocarcinoma invading the distal appendix with perforation. One mesenteric lymph node negative for malignancy. Retroperitoneal tumor removal was also positive for mucinous adenocarcinoma. Tumor was grade 2. Proximal and distal margins were negative.[id number]. MSI stable. KRAS mutation positive  Case discussed at tumor board. PET CT will not be done at this time as it may represent post surgical changes. Radiation oncology does not recommend adjuvant RT upon completion of chemotherapy.she was found to have recurrent small bowel soft tissue mass and peritoneal nodularity after 5 cycles of folfox. FOLFOX avastin to be given with a palliative intent. oxaliplatin discontinued after 5 cycles due to progressive thrombocytopenia requiring treatment interruptions   Interval history-she is doing well at the nursing home and reports no new complaints today.  Denies any fatigue nausea or vomiting her bowel movements have been regular.  ECOG PS-1 Pain scale- 0 Opioid associated constipation- no  Review of systems- Review of Systems  Constitutional: Negative for chills, fever, malaise/fatigue and weight loss.  HENT: Negative for congestion, ear discharge and nosebleeds.   Eyes: Negative for blurred vision.  Respiratory: Negative for cough, hemoptysis, sputum production, shortness of breath and wheezing.   Cardiovascular: Negative for chest pain, palpitations, orthopnea and claudication.  Gastrointestinal: Negative for abdominal pain, blood in stool, constipation, diarrhea, heartburn, melena, nausea and vomiting.  Genitourinary: Negative for dysuria, flank pain, frequency, hematuria and urgency.  Musculoskeletal: Negative  for back pain, joint pain and myalgias.  Skin: Negative for rash.  Neurological: Negative for dizziness, tingling, focal weakness, seizures, weakness and headaches.  Endo/Heme/Allergies: Does not bruise/bleed easily.  Psychiatric/Behavioral: Negative for depression and suicidal ideas. The patient does not have insomnia.      No Known Allergies   Past Medical History:  Diagnosis Date  . Cancer ([place])   . Colon cancer [facility])      Past Surgical History:  Procedure Laterality Date  . COLOSTOMY REVISION N/A 12/06/2017   Procedure: COLON RESECTION RIGHT;  Surgeon: Florene Glen, MD;  Location: [facility] ORS;  Service: General;  Laterality: N/A;  . DILATION AND CURETTAGE, DIAGNOSTIC / THERAPEUTIC    . PORTACATH PLACEMENT N/A 01/06/2018   Procedure: INSERTION PORT-A-CATH;  Surgeon: Vickie Epley, MD;  Location: [facility] ORS;  Service: General;  Laterality: N/A;    Social History   Socioeconomic History  . Marital status: Widowed    Spouse name: Not on file  . Number of children: Not on file  . Years of education: Not on file  . Highest education level: Not on file  Occupational History  . Not on file  Social Needs  . Financial resource strain: Patient refused  . Food insecurity:    Worry: Patient refused    Inability: Patient refused  . Transportation needs:    Medical: Patient refused    Non-medical: Patient refused  Tobacco Use  . Smoking status: Former Research scientist (life sciences)  . Smokeless tobacco: Never Used  Substance and Sexual Activity  . Alcohol use: No  . Drug use: No  . Sexual activity: Never  Lifestyle  . Physical activity:    Days per week: Patient refused    Minutes per session: Patient refused  . Stress: Patient refused  Relationships  . Social connections:    Talks on phone: Patient refused    Gets together: Patient refused    Attends religious service: Patient refused    Active member of club or organization: Patient refused    Attends meetings of clubs or  organizations: Patient refused    Relationship status: Patient refused  . Intimate partner violence:    Fear of current or ex partner: Patient refused    Emotionally abused: Patient refused    Physically abused: Patient refused    Forced sexual activity: Patient refused  Other Topics Concern  . Not on file  Social History Narrative  . Not on file    Family History  Problem Relation Age of Onset  . ALS Mother   . ALS Father      Current Outpatient Medications:  .  acetaminophen (TYLENOL) 500 MG tablet, Take 1,000 mg by mouth every 8 (eight) hours as needed for mild pain., Disp: , Rfl:  .  alendronate (FOSAMAX) 70 MG tablet, Take 70 mg by mouth once a week., Disp: , Rfl:  .  amLODipine (NORVASC) 10 MG tablet, Take 10 mg by mouth daily., Disp: , Rfl:  .  ARIPiprazole (ABILIFY) 5 MG tablet, Take 5 mg by mouth at bedtime. , Disp: , Rfl:  .  benztropine (COGENTIN) 0.5 MG tablet, Take 0.5 mg by mouth at bedtime., Disp: , Rfl:  .  Calcium 600-200 MG-UNIT tablet, Take 1 tablet by mouth daily., Disp: , Rfl:  .  calcium carbonate (TUMS - DOSED IN MG ELEMENTAL CALCIUM) 500 MG chewable tablet, Chew 1 tablet by mouth 2 (two) times daily as needed for indigestion or heartburn., Disp: , Rfl:  .  divalproex (DEPAKOTE) 500 MG DR tablet, Take 500 mg  by mouth at bedtime. , Disp: , Rfl:  .  lidocaine-prilocaine (EMLA) cream, Apply 1 application topically as needed. 1 hour prior to  each chemotherapy treatment. Place small amount over port site and place saran wrap over the cream to protect clothing, Disp: 30 g, Rfl: 0 .  loratadine (CLARITIN) 10 MG tablet, Take 1 tablet (10 mg total) by mouth daily as needed for allergies. Take 1 tablet the day before pump removal, 1 tablet the day of pump removal and 1 tablet the day after pump removal. Take as directed with each cycle of  chemotherapy treatment, Disp: 30 tablet, Rfl: 1 .  memantine (NAMENDA XR) 14 MG CP24 24 hr capsule, Take 14 mg by mouth at bedtime. ,  Disp: , Rfl:  .  metoprolol succinate (TOPROL-XL) 50 MG 24 hr tablet, Take 50 mg by mouth every morning. Take with or immediately following a meal. , Disp: , Rfl:  .  Multiple Vitamin (MULTIVITAMIN WITH MINERALS) TABS tablet, Take 1 tablet by mouth daily., Disp: 30 tablet, Rfl: 0 .  perphenazine (TRILAFON) 4 MG tablet, Take 4 mg by mouth 2 (two) times daily., Disp: , Rfl:  .  dexamethasone (DECADRON) 4 MG tablet, Take 1 tablet (4 mg total) by mouth 2 (two) times daily with a meal. Starting the day after chemotherapy for 2 days (Patient not taking: Reported on 04/24/2018), Disp: 30 tablet, Rfl: 0 .  ondansetron (ZOFRAN) 8 MG tablet, Take 1 tablet (8 mg total) by mouth 2 (two) times daily as needed for nausea or vomiting. Can start using on day 3 after chemo (Patient not taking: Reported on 04/24/2018), Disp: 20 tablet, Rfl: 0 .  prochlorperazine (COMPAZINE) 10 MG tablet, Take 1 tablet (10 mg total) by mouth every 6 (six) hours as needed for nausea or vomiting. (Patient not taking: Reported on 04/24/2018), Disp: 30 tablet, Rfl: 0 No current facility-administered medications for this visit.   Facility-Administered Medications Ordered in Other Visits:  .  dextrose 5 % solution, , Intravenous, Once, Randa Evens C, MD .  heparin lock flush 100 unit/mL, 500 Units, Intravenous, Once, Sindy Guadeloupe, MD .  heparin lock flush 100 unit/mL, 500 Units, Intracatheter, Once PRN, Sindy Guadeloupe, MD .  heparin lock flush 100 unit/mL, 500 Units, Intravenous, Once, Sindy Guadeloupe, MD .  sodium chloride flush (NS) 0.9 % injection 10 mL, 10 mL, Intravenous, PRN, Sindy Guadeloupe, MD, 10 mL at 02/06/18 0845 .  sodium chloride flush (NS) 0.9 % injection 10 mL, 10 mL, Intracatheter, PRN, Sindy Guadeloupe, MD .  sodium chloride flush (NS) 0.9 % injection 10 mL, 10 mL, Intracatheter, PRN, Sindy Guadeloupe, MD, 10 mL at 03/06/18 0900 .  sodium chloride flush (NS) 0.9 % injection 10 mL, 10 mL, Intravenous, PRN, Sindy Guadeloupe, MD, 10 mL  at 03/27/18 0909  Physical exam:  Vitals:   07/31/18 0949  BP: (!) 142/75  Pulse: 63  Resp: 18  Temp: 97.6 F (36.4 C)  Weight: 190 lb 8 oz (86.4 kg)  Height: '5\' 5"'  (1.651 m)   Physical Exam  Constitutional: She is oriented to person, place, and time.  She is obese.  Appears in no acute distress  HENT:  Head: Normocephalic and atraumatic.  Eyes: Pupils are equal, round, and reactive to light. EOM are normal.  Neck: Normal range of motion.  Cardiovascular: Normal rate, regular rhythm and normal heart sounds.  Pulmonary/Chest: Effort normal and breath sounds normal.  Abdominal: Soft. Bowel sounds  are normal.  Musculoskeletal: She exhibits edema (Trace bilateral ankle edema).  Neurological: She is alert and oriented to person, place, and time.  Skin: Skin is warm and dry.     CMP Latest Ref Rng & Units 07/31/2018  Glucose 70 - 99 mg/dL 105(H)  BUN 8 - 23 mg/dL 8  Creatinine 0.44 - 1.00 mg/dL 0.78  Sodium 135 - 145 mmol/L 127(L)  Potassium 3.5 - 5.1 mmol/L 4.0  Chloride 98 - 111 mmol/L 96(L)  CO2 22 - 32 mmol/L 21(L)  Calcium 8.9 - 10.3 mg/dL 8.9  Total Protein 6.5 - 8.1 g/dL 6.6  Total Bilirubin 0.3 - 1.2 mg/dL 1.1  Alkaline Phos 38 - 126 U/L 110  AST 15 - 41 U/L 27  ALT 0 - 44 U/L 20   CBC Latest Ref Rng & Units 07/31/2018  WBC 3.6 - 11.0 K/uL 7.5  Hemoglobin 12.0 - 16.0 g/dL 12.9  Hematocrit 35.0 - 47.0 % 38.0  Platelets 150 - 440 K/uL 111(L)    No images are attached to the encounter.  Ct Chest W Contrast  Result Date: 07/27/2018 CLINICAL DATA:  Patient with history of colon cancer. Follow-up exam. EXAM: CT CHEST, ABDOMEN, AND PELVIS WITH CONTRAST TECHNIQUE: Multidetector CT imaging of the chest, abdomen and pelvis was performed following the standard protocol during bolus administration of intravenous contrast. CONTRAST:  114m ISOVUE-300 IOPAMIDOL (ISOVUE-300) INJECTION 61% COMPARISON:  CT CAP 04/20/2018 FINDINGS: CT CHEST FINDINGS Cardiovascular: Normal heart size.  No pericardial effusion. Right anterior chest wall Port-A-Cath is present with tip terminating in the superior vena cava. Mediastinum/Nodes: No enlarged axillary, mediastinal or hilar lymphadenopathy. Small hiatal hernia. Lungs/Pleura: Central airways are patent. Interval development of patchy ground-glass nodularity predominately involving the upper lobes bilaterally. Bilateral lower lobe bandlike atelectasis. No pleural effusion or pneumothorax. Stable 4 mm right upper lobe nodule (image 37; series 4). Musculoskeletal: No aggressive or acute appearing osseous lesions. Thoracic spine degenerative changes. CT ABDOMEN PELVIS FINDINGS Hepatobiliary: Interval increase in size of 1.8 cm low-attenuation lesion right hepatic lobe (image 49; series 2), previously 1.3 cm. There is an additional 0.8 cm lesion within the hepatic dome (image 46; series 2). Re demonstrated vascular anomaly within the right hepatic lobe (image 55; series 2) most compatible with portal venous shunt. Gallbladder is decompressed. No intrahepatic or extrahepatic biliary ductal dilatation. Pancreas: Unremarkable Spleen: Unremarkable Adrenals/Urinary Tract: Stable 1 cm left adrenal nodule (image 61; series 2). Stable 7 mm right adrenal nodule (image 59; series 2). Hree demonstrated bilateral renal cysts, largest off the inferior pole of the right kidney. No hydronephrosis. Urinary bladder is decompressed. Stomach/Bowel: Patient status post distal ileal resection and right hemicolectomy. Similar-appearing 2.5 x 1.9 cm soft tissue mass involving the distal small bowel (image 98; series 2). Similar-appearing nodularity involving the peritoneum within the right pericolic gutter (image 84; series 2). Reference nodule measures 9 mm, similar to prior (image 84; series 2). Similar-appearing small nodules within the mesentery (image 91; series 2). Similar-appearing irregular soft tissue at the anastomotic suture line (image 85; series 2). Vascular/Lymphatic:  Normal caliber abdominal aorta. Peripheral calcified atherosclerotic plaque. No retroperitoneal lymphadenopathy. Reproductive: Uterus is unremarkable. Interval decrease in soft tissue within the right adnexa which may represent improving fluid/postsurgical changes. Other: Small bowel containing anterior abdominal wall hernia. Musculoskeletal: Lumbar spine degenerative changes. No aggressive or acute appearing osseous lesions IMPRESSION: 1. Interval increase in size of right hepatic lobe lesion concerning hepatic metastatic disease. 2. Similar-appearing nodularity within the right pericolic gutter which may  represent metastatic disease or postsurgical changes. 3. Similar-appearing soft tissue mass within the distal small bowel concerning for residual disease. 4. Stable bilateral adrenal nodules. Recommend attention on follow-up. 5. Interval development of patchy ground-glass opacities predominantly within the upper lobes bilaterally, favored to be infectious/inflammatory in etiology. Recommend attention on follow-up. Electronically Signed   By: Lovey Newcomer M.D.   On: 07/27/2018 15:48   Ct Abdomen Pelvis W Contrast  Result Date: 07/27/2018 CLINICAL DATA:  Patient with history of colon cancer. Follow-up exam. EXAM: CT CHEST, ABDOMEN, AND PELVIS WITH CONTRAST TECHNIQUE: Multidetector CT imaging of the chest, abdomen and pelvis was performed following the standard protocol during bolus administration of intravenous contrast. CONTRAST:  165m ISOVUE-300 IOPAMIDOL (ISOVUE-300) INJECTION 61% COMPARISON:  CT CAP 04/20/2018 FINDINGS: CT CHEST FINDINGS Cardiovascular: Normal heart size. No pericardial effusion. Right anterior chest wall Port-A-Cath is present with tip terminating in the superior vena cava. Mediastinum/Nodes: No enlarged axillary, mediastinal or hilar lymphadenopathy. Small hiatal hernia. Lungs/Pleura: Central airways are patent. Interval development of patchy ground-glass nodularity predominately involving  the upper lobes bilaterally. Bilateral lower lobe bandlike atelectasis. No pleural effusion or pneumothorax. Stable 4 mm right upper lobe nodule (image 37; series 4). Musculoskeletal: No aggressive or acute appearing osseous lesions. Thoracic spine degenerative changes. CT ABDOMEN PELVIS FINDINGS Hepatobiliary: Interval increase in size of 1.8 cm low-attenuation lesion right hepatic lobe (image 49; series 2), previously 1.3 cm. There is an additional 0.8 cm lesion within the hepatic dome (image 46; series 2). Re demonstrated vascular anomaly within the right hepatic lobe (image 55; series 2) most compatible with portal venous shunt. Gallbladder is decompressed. No intrahepatic or extrahepatic biliary ductal dilatation. Pancreas: Unremarkable Spleen: Unremarkable Adrenals/Urinary Tract: Stable 1 cm left adrenal nodule (image 61; series 2). Stable 7 mm right adrenal nodule (image 59; series 2). Hree demonstrated bilateral renal cysts, largest off the inferior pole of the right kidney. No hydronephrosis. Urinary bladder is decompressed. Stomach/Bowel: Patient status post distal ileal resection and right hemicolectomy. Similar-appearing 2.5 x 1.9 cm soft tissue mass involving the distal small bowel (image 98; series 2). Similar-appearing nodularity involving the peritoneum within the right pericolic gutter (image 84; series 2). Reference nodule measures 9 mm, similar to prior (image 84; series 2). Similar-appearing small nodules within the mesentery (image 91; series 2). Similar-appearing irregular soft tissue at the anastomotic suture line (image 85; series 2). Vascular/Lymphatic: Normal caliber abdominal aorta. Peripheral calcified atherosclerotic plaque. No retroperitoneal lymphadenopathy. Reproductive: Uterus is unremarkable. Interval decrease in soft tissue within the right adnexa which may represent improving fluid/postsurgical changes. Other: Small bowel containing anterior abdominal wall hernia. Musculoskeletal:  Lumbar spine degenerative changes. No aggressive or acute appearing osseous lesions IMPRESSION: 1. Interval increase in size of right hepatic lobe lesion concerning hepatic metastatic disease. 2. Similar-appearing nodularity within the right pericolic gutter which may represent metastatic disease or postsurgical changes. 3. Similar-appearing soft tissue mass within the distal small bowel concerning for residual disease. 4. Stable bilateral adrenal nodules. Recommend attention on follow-up. 5. Interval development of patchy ground-glass opacities predominantly within the upper lobes bilaterally, favored to be infectious/inflammatory in etiology. Recommend attention on follow-up. Electronically Signed   By: DLovey NewcomerM.D.   On: 07/27/2018 15:48     Assessment and plan- Patient is a 72y.o. female with stage IV: Adenocarcinoma with liver metastases and peritoneal involvement.  She is here for on treatment assessment prior to cycle 12 of 5-FU and Avastin chemotherapy.   Avastin was added after 6 cycles  of chemotherapy as there was some evidence of peritoneal nodularity seen after 6 cycles of FOLFOX.  I have reviewed the recent CT chest abdomen and pelvis images independently and discussed findings with the patient and her caregiver.  Overall the peritoneal nodularity as well as a soft tissue mass near the small bowel is stable.  However the right hepatic lobe lesion now appears bigger from 1.3 cm to 1.8 cm and a new 0.8 cm lesion is seen in the hepatic dome.  This is concerning for disease progression.  At this time I will continue with 5-FU and a Avastin also add on irinotecan 150 mg/m.  Discussed that chemotherapy would be palliative and not curative.  Discussed risks and benefits of irinotecan including all but not limited to nausea, vomiting, low blood counts and diarrhea.  Patient understands and agrees to proceed.  Labs show significant hyponatremia and hypochloremia today.  I will therefore proceed  with 1 L of IV fluids today  I will see her back in 2 weeks time with CBC CMP and urine protein for next cycle of FOLFIRI and Avastin.   Visit Diagnosis 1. Hyponatremia   2. Encounter for antineoplastic chemotherapy   3. Encounter for monoclonal antibody treatment for malignancy      Dr. Randa Evens, MD, MPH [facility] [phone] 08/01/2018 3:18 PM

## 2018-08-02 ENCOUNTER — Inpatient Hospital Stay: Payer: Medicare Other

## 2018-08-02 VITALS — BP 124/74 | HR 61 | Temp 96.6°F | Resp 20

## 2018-08-02 DIAGNOSIS — Z5111 Encounter for antineoplastic chemotherapy: Secondary | ICD-10-CM | POA: Diagnosis not present

## 2018-08-02 DIAGNOSIS — C189 Malignant neoplasm of colon, unspecified: Secondary | ICD-10-CM

## 2018-08-02 MED ORDER — SODIUM CHLORIDE 0.9% FLUSH
10.0000 mL | INTRAVENOUS | Status: DC | PRN
  Administered 2018-08-02: 10 mL
  Filled 2018-08-02: qty 10

## 2018-08-02 MED ORDER — HEPARIN SOD (PORK) LOCK FLUSH 100 UNIT/ML IV SOLN
500.0000 [IU] | Freq: Once | INTRAVENOUS | Status: AC | PRN
  Administered 2018-08-02: 500 [IU]

## 2018-08-02 MED ORDER — PEGFILGRASTIM INJECTION 6 MG/0.6ML ~~LOC~~
6.0000 mg | PREFILLED_SYRINGE | Freq: Once | SUBCUTANEOUS | Status: AC
  Administered 2018-08-02: 6 mg via SUBCUTANEOUS

## 2018-08-14 ENCOUNTER — Inpatient Hospital Stay: Payer: Medicare Other

## 2018-08-14 ENCOUNTER — Encounter: Payer: Self-pay | Admitting: Oncology

## 2018-08-14 ENCOUNTER — Inpatient Hospital Stay (HOSPITAL_BASED_OUTPATIENT_CLINIC_OR_DEPARTMENT_OTHER): Payer: Medicare Other | Admitting: Oncology

## 2018-08-14 VITALS — BP 100/66 | HR 106 | Temp 96.6°F | Resp 18 | Ht 65.0 in | Wt 169.6 lb

## 2018-08-14 VITALS — BP 116/75 | HR 79 | Temp 97.7°F | Resp 18

## 2018-08-14 DIAGNOSIS — D6959 Other secondary thrombocytopenia: Secondary | ICD-10-CM

## 2018-08-14 DIAGNOSIS — C786 Secondary malignant neoplasm of retroperitoneum and peritoneum: Secondary | ICD-10-CM | POA: Diagnosis not present

## 2018-08-14 DIAGNOSIS — E878 Other disorders of electrolyte and fluid balance, not elsewhere classified: Secondary | ICD-10-CM

## 2018-08-14 DIAGNOSIS — R911 Solitary pulmonary nodule: Secondary | ICD-10-CM

## 2018-08-14 DIAGNOSIS — C182 Malignant neoplasm of ascending colon: Secondary | ICD-10-CM | POA: Diagnosis not present

## 2018-08-14 DIAGNOSIS — C787 Secondary malignant neoplasm of liver and intrahepatic bile duct: Secondary | ICD-10-CM | POA: Diagnosis not present

## 2018-08-14 DIAGNOSIS — E871 Hypo-osmolality and hyponatremia: Secondary | ICD-10-CM

## 2018-08-14 DIAGNOSIS — C189 Malignant neoplasm of colon, unspecified: Secondary | ICD-10-CM

## 2018-08-14 DIAGNOSIS — K521 Toxic gastroenteritis and colitis: Secondary | ICD-10-CM

## 2018-08-14 DIAGNOSIS — E876 Hypokalemia: Secondary | ICD-10-CM

## 2018-08-14 DIAGNOSIS — Z79899 Other long term (current) drug therapy: Secondary | ICD-10-CM

## 2018-08-14 DIAGNOSIS — T451X5A Adverse effect of antineoplastic and immunosuppressive drugs, initial encounter: Secondary | ICD-10-CM

## 2018-08-14 DIAGNOSIS — Z9049 Acquired absence of other specified parts of digestive tract: Secondary | ICD-10-CM

## 2018-08-14 DIAGNOSIS — Z87891 Personal history of nicotine dependence: Secondary | ICD-10-CM

## 2018-08-14 DIAGNOSIS — Z5111 Encounter for antineoplastic chemotherapy: Secondary | ICD-10-CM | POA: Diagnosis not present

## 2018-08-14 LAB — CBC WITH DIFFERENTIAL/PLATELET
BASOS ABS: 0 10*3/uL (ref 0–0.1)
Basophils Relative: 0 %
Eosinophils Absolute: 0 10*3/uL (ref 0–0.7)
Eosinophils Relative: 1 %
HCT: 32.3 % — ABNORMAL LOW (ref 35.0–47.0)
HEMOGLOBIN: 10.9 g/dL — AB (ref 12.0–16.0)
LYMPHS ABS: 0.5 10*3/uL — AB (ref 1.0–3.6)
LYMPHS PCT: 7 %
MCH: 35.1 pg — AB (ref 26.0–34.0)
MCHC: 33.7 g/dL (ref 32.0–36.0)
MCV: 104 fL — AB (ref 80.0–100.0)
Monocytes Absolute: 0.7 10*3/uL (ref 0.2–0.9)
Monocytes Relative: 9 %
NEUTROS PCT: 83 %
Neutro Abs: 6.5 10*3/uL (ref 1.4–6.5)
Platelets: 115 10*3/uL — ABNORMAL LOW (ref 150–440)
RBC: 3.1 MIL/uL — AB (ref 3.80–5.20)
RDW: 18.7 % — ABNORMAL HIGH (ref 11.5–14.5)
WBC: 7.8 10*3/uL (ref 3.6–11.0)

## 2018-08-14 LAB — COMPREHENSIVE METABOLIC PANEL
ALT: 16 U/L (ref 0–44)
AST: 35 U/L (ref 15–41)
Albumin: 3.3 g/dL — ABNORMAL LOW (ref 3.5–5.0)
Alkaline Phosphatase: 86 U/L (ref 38–126)
Anion gap: 14 (ref 5–15)
BILIRUBIN TOTAL: 0.7 mg/dL (ref 0.3–1.2)
BUN: 10 mg/dL (ref 8–23)
CHLORIDE: 105 mmol/L (ref 98–111)
CO2: 18 mmol/L — ABNORMAL LOW (ref 22–32)
Calcium: 8.7 mg/dL — ABNORMAL LOW (ref 8.9–10.3)
Creatinine, Ser: 1.02 mg/dL — ABNORMAL HIGH (ref 0.44–1.00)
GFR, EST NON AFRICAN AMERICAN: 55 mL/min — AB (ref >60)
Glucose, Bld: 117 mg/dL — ABNORMAL HIGH (ref 70–99)
POTASSIUM: 2.8 mmol/L — AB (ref 3.5–5.1)
Sodium: 137 mmol/L (ref 135–145)
TOTAL PROTEIN: 5.8 g/dL — AB (ref 6.5–8.1)

## 2018-08-14 MED ORDER — HEPARIN SOD (PORK) LOCK FLUSH 100 UNIT/ML IV SOLN
500.0000 [IU] | Freq: Once | INTRAVENOUS | Status: AC
  Administered 2018-08-14: 500 [IU] via INTRAVENOUS
  Filled 2018-08-14: qty 5

## 2018-08-14 MED ORDER — SODIUM CHLORIDE 0.9 % IV SOLN
Freq: Once | INTRAVENOUS | Status: AC
  Administered 2018-08-14: 10:00:00 via INTRAVENOUS
  Filled 2018-08-14: qty 1000

## 2018-08-14 MED ORDER — SODIUM CHLORIDE 0.9% FLUSH
10.0000 mL | INTRAVENOUS | Status: DC | PRN
  Administered 2018-08-14: 10 mL via INTRAVENOUS
  Filled 2018-08-14: qty 10

## 2018-08-14 NOTE — Progress Notes (Signed)
Hematology/Oncology Consult note [facility]  [phone] [phone]  Patient Care Team: Lin Givens, MD as PCP - General (Family Medicine) Clent Jacks, RN as Registered Nurse   Name of the patient: Latoya Jones  [id number]  [birth year]   Date of visit: 08/14/18  Diagnosis- adenocarcinoma of the colon at least stage IIIC pT4b pN2aMxstatus post hemicolectomy.  Subsequently patient found to have a peritoneal nodule as well as liver metastases and is currently being treated as stage IV colon cancer   Chief complaint/ Reason for visit- on treatment assessment prior to cycle 2 of FOLFIRI avastin  Heme/Onc history:  Patient is a 72 year old female with a h/o developmental and mood disoredr who resides at a group home. She presented to the ER with symptoms of dizziness mostly when she stands up. She has had problems with constipation in the past and takes prune juice to relieve it. Patient is a poor historian at baseline and complained of abdominal pain in ER which led to CT abdomen. 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  CT chest without contrast showed tiny 2 mm right upper lobe pulmonary nodule. No other evidence of metastatic disease. Baseline CEA elevated at 4.8  Patient underwent exploratory laparotomy with right hemicolectomy and terminal ileum resection as well as separate small bowel resection on 12/06/2017. Operative findings showed a large cecal tumor with extramural spread to retroperitoneum mesentery, mesentery lymph nodes and parietal peritoneum. Dense inflammatory response of the retroperitoneum and small bowel.There was a second portion of small bowel that was involved and was resected. A small portion of free tumor was identified in the retroperitoneum which was sent off for examination as well.  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  Radial margin positive for invasive carcinoma 2 colon adenomas 2.5 and 1.8 cm of the ascending colon. Metastatic adenocarcinoma in 4 out of 13 lymph nodes. 2 tumor deposits Segment of small intestine with abscess which also showed adenocarcinoma invading the distal appendix with perforation. One mesenteric lymph node negative for malignancy. Retroperitoneal tumor removal was also positive for mucinous adenocarcinoma. Tumor was grade 2. Proximal and distal margins were negative.pT4bpN2a. MSI stable. KRAS mutation positive  Case discussed at tumor board. PET CT will not be done at this time as it may represent post surgical changes. Radiation oncology does not recommend adjuvant RT upon completion of chemotherapy.she was found to have recurrent small bowel soft tissue mass and peritoneal nodularity after 5 cycles of folfox. FOLFOX avastin to be given with a palliative intent. oxaliplatin discontinued after 5 cycles due to progressive thrombocytopenia requiring treatment interruptions  Scans in sept showed stable primary mass but increase in size of liver lesions. FOLFIRI avastin started on 07/31/18   Interval history- she has lost 20 pounds in the last 2 weeks. She has been having on and off diarrhea. She has not used imodium yet. Caregiver feels that her cognitive abilities have declined in last 2 weeks as well. On one occasion she did not use the restroom despite no one being there as she was confused. Today patient reports feeling fatigued.   ECOG PS- 2 Pain scale- 0 Opioid associated constipation- no  Review of systems- Review of Systems  Constitutional: Positive for malaise/fatigue.  Gastrointestinal: Positive for diarrhea.      No Known Allergies   Past Medical History:  Diagnosis Date  . Cancer ([place])   . Colon cancer ([place])  Past Surgical History:  Procedure Laterality Date  . COLOSTOMY REVISION N/A 12/06/2017   Procedure: COLON RESECTION RIGHT;  Surgeon: Florene Glen, MD;  Location: [facility] ORS;  Service: General;  Laterality: N/A;  . DILATION AND CURETTAGE, DIAGNOSTIC / THERAPEUTIC    . PORTACATH PLACEMENT N/A 01/06/2018   Procedure: INSERTION PORT-A-CATH;  Surgeon: Vickie Epley, MD;  Location: [facility] ORS;  Service: General;  Laterality: N/A;    Social History   Socioeconomic History  . Marital status: Widowed    Spouse name: Not on file  . Number of children: Not on file  . Years of education: Not on file  . Highest education level: Not on file  Occupational History  . Not on file  Social Needs  . Financial resource strain: Patient refused  . Food insecurity:    Worry: Patient refused    Inability: Patient refused  . Transportation needs:    Medical: Patient refused    Non-medical: Patient refused  Tobacco Use  . Smoking status: Former Research scientist (life sciences)  . Smokeless tobacco: Never Used  Substance and Sexual Activity  . Alcohol use: No  . Drug use: No  . Sexual activity: Never  Lifestyle  . Physical activity:    Days per week: Patient refused    Minutes per session: Patient refused  . Stress: Patient refused  Relationships  . Social connections:    Talks on phone: Patient refused    Gets together: Patient refused    Attends religious service: Patient refused    Active member of club or organization: Patient refused    Attends meetings of clubs or organizations: Patient refused    Relationship status: Patient refused  . Intimate partner violence:    Fear of current or ex partner: Patient refused    Emotionally abused: Patient refused    Physically abused: Patient refused    Forced sexual activity: Patient refused  Other Topics Concern  . Not on file  Social History Narrative  . Not on file    Family History  Problem Relation Age of Onset  . ALS Mother   . ALS Father      Current Outpatient Medications:  .  acetaminophen (TYLENOL) 500 MG tablet, Take 1,000 mg by mouth every 8 (eight) hours as needed for mild pain.,  Disp: , Rfl:  .  alendronate (FOSAMAX) 70 MG tablet, Take 70 mg by mouth once a week., Disp: , Rfl:  .  amLODipine (NORVASC) 10 MG tablet, Take 10 mg by mouth daily., Disp: , Rfl:  .  ARIPiprazole (ABILIFY) 5 MG tablet, Take 5 mg by mouth at bedtime. , Disp: , Rfl:  .  benztropine (COGENTIN) 0.5 MG tablet, Take 0.5 mg by mouth at bedtime., Disp: , Rfl:  .  Calcium 600-200 MG-UNIT tablet, Take 1 tablet by mouth daily., Disp: , Rfl:  .  calcium carbonate (TUMS - DOSED IN MG ELEMENTAL CALCIUM) 500 MG chewable tablet, Chew 1 tablet by mouth 2 (two) times daily as needed for indigestion or heartburn., Disp: , Rfl:  .  dexamethasone (DECADRON) 4 MG tablet, Take 1 tablet (4 mg total) by mouth 2 (two) times daily with a meal. Starting the day after chemotherapy for 2 days, Disp: 30 tablet, Rfl: 0 .  divalproex (DEPAKOTE) 500 MG DR tablet, Take 500 mg by mouth at bedtime. , Disp: , Rfl:  .  lidocaine-prilocaine (EMLA) cream, Apply 1 application topically as needed. 1 hour prior to  each chemotherapy treatment. Place small amount  over port site and place saran wrap over the cream to protect clothing, Disp: 30 g, Rfl: 0 .  loratadine (CLARITIN) 10 MG tablet, Take 1 tablet (10 mg total) by mouth daily as needed for allergies. Take 1 tablet the day before pump removal, 1 tablet the day of pump removal and 1 tablet the day after pump removal. Take as directed with each cycle of  chemotherapy treatment, Disp: 30 tablet, Rfl: 1 .  memantine (NAMENDA XR) 14 MG CP24 24 hr capsule, Take 14 mg by mouth at bedtime. , Disp: , Rfl:  .  metoprolol succinate (TOPROL-XL) 50 MG 24 hr tablet, Take 50 mg by mouth every morning. Take with or immediately following a meal. , Disp: , Rfl:  .  Multiple Vitamin (MULTIVITAMIN WITH MINERALS) TABS tablet, Take 1 tablet by mouth daily., Disp: 30 tablet, Rfl: 0 .  ondansetron (ZOFRAN) 8 MG tablet, Take 1 tablet (8 mg total) by mouth 2 (two) times daily as needed for nausea or vomiting. Can  start using on day 3 after chemo, Disp: 20 tablet, Rfl: 0 .  perphenazine (TRILAFON) 4 MG tablet, Take 4 mg by mouth 2 (two) times daily., Disp: , Rfl:  .  prochlorperazine (COMPAZINE) 10 MG tablet, Take 1 tablet (10 mg total) by mouth every 6 (six) hours as needed for nausea or vomiting., Disp: 30 tablet, Rfl: 0 No current facility-administered medications for this visit.   Facility-Administered Medications Ordered in Other Visits:  .  dextrose 5 % solution, , Intravenous, Once, Randa Evens C, MD .  heparin lock flush 100 unit/mL, 500 Units, Intravenous, Once, Sindy Guadeloupe, MD .  heparin lock flush 100 unit/mL, 500 Units, Intracatheter, Once PRN, Sindy Guadeloupe, MD .  heparin lock flush 100 unit/mL, 500 Units, Intravenous, Once, Sindy Guadeloupe, MD .  sodium chloride flush (NS) 0.9 % injection 10 mL, 10 mL, Intravenous, PRN, Sindy Guadeloupe, MD, 10 mL at 02/06/18 0845 .  sodium chloride flush (NS) 0.9 % injection 10 mL, 10 mL, Intracatheter, PRN, Sindy Guadeloupe, MD .  sodium chloride flush (NS) 0.9 % injection 10 mL, 10 mL, Intracatheter, PRN, Sindy Guadeloupe, MD, 10 mL at 03/06/18 0900 .  sodium chloride flush (NS) 0.9 % injection 10 mL, 10 mL, Intravenous, PRN, Sindy Guadeloupe, MD, 10 mL at 03/27/18 0909 .  sodium chloride flush (NS) 0.9 % injection 10 mL, 10 mL, Intravenous, PRN, Sindy Guadeloupe, MD, 10 mL at 08/14/18 0840  Physical exam:  Vitals:   08/14/18 0858  BP: 100/66  Pulse: (!) 106  Resp: 18  Temp: (!) 96.6 F (35.9 C)  TempSrc: Tympanic  Weight: 169 lb 9.6 oz (76.9 kg)  Height: '5\' 5"'  (1.651 m)   Physical Exam  Constitutional: She is oriented to person, place, and time. She appears well-developed and well-nourished.  Appears fatigued  HENT:  Head: Normocephalic and atraumatic.  Eyes: Pupils are equal, round, and reactive to light. EOM are normal.  Neck: Normal range of motion.  Cardiovascular: Normal rate, regular rhythm and normal heart sounds.  Pulmonary/Chest: Effort  normal and breath sounds normal.  Abdominal: Soft. Bowel sounds are normal.  Neurological: She is alert and oriented to person, place, and time.  Skin: Skin is warm and dry.     CMP Latest Ref Rng & Units 08/14/2018  Glucose 70 - 99 mg/dL 117(H)  BUN 8 - 23 mg/dL 10  Creatinine 0.44 - 1.00 mg/dL 1.02(H)  Sodium 135 - 145  mmol/L 137  Potassium 3.5 - 5.1 mmol/L 2.8(L)  Chloride 98 - 111 mmol/L 105  CO2 22 - 32 mmol/L 18(L)  Calcium 8.9 - 10.3 mg/dL 8.7(L)  Total Protein 6.5 - 8.1 g/dL 5.8(L)  Total Bilirubin 0.3 - 1.2 mg/dL 0.7  Alkaline Phos 38 - 126 U/L 86  AST 15 - 41 U/L 35  ALT 0 - 44 U/L 16   CBC Latest Ref Rng & Units 08/14/2018  WBC 3.6 - 11.0 K/uL 7.8  Hemoglobin 12.0 - 16.0 g/dL 10.9(L)  Hematocrit 35.0 - 47.0 % 32.3(L)  Platelets 150 - 440 K/uL 115(L)    No images are attached to the encounter.  Ct Chest W Contrast  Result Date: 07/27/2018 CLINICAL DATA:  Patient with history of colon cancer. Follow-up exam. EXAM: CT CHEST, ABDOMEN, AND PELVIS WITH CONTRAST TECHNIQUE: Multidetector CT imaging of the chest, abdomen and pelvis was performed following the standard protocol during bolus administration of intravenous contrast. CONTRAST:  146m ISOVUE-300 IOPAMIDOL (ISOVUE-300) INJECTION 61% COMPARISON:  CT CAP 04/20/2018 FINDINGS: CT CHEST FINDINGS Cardiovascular: Normal heart size. No pericardial effusion. Right anterior chest wall Port-A-Cath is present with tip terminating in the superior vena cava. Mediastinum/Nodes: No enlarged axillary, mediastinal or hilar lymphadenopathy. Small hiatal hernia. Lungs/Pleura: Central airways are patent. Interval development of patchy ground-glass nodularity predominately involving the upper lobes bilaterally. Bilateral lower lobe bandlike atelectasis. No pleural effusion or pneumothorax. Stable 4 mm right upper lobe nodule (image 37; series 4). Musculoskeletal: No aggressive or acute appearing osseous lesions. Thoracic spine degenerative  changes. CT ABDOMEN PELVIS FINDINGS Hepatobiliary: Interval increase in size of 1.8 cm low-attenuation lesion right hepatic lobe (image 49; series 2), previously 1.3 cm. There is an additional 0.8 cm lesion within the hepatic dome (image 46; series 2). Re demonstrated vascular anomaly within the right hepatic lobe (image 55; series 2) most compatible with portal venous shunt. Gallbladder is decompressed. No intrahepatic or extrahepatic biliary ductal dilatation. Pancreas: Unremarkable Spleen: Unremarkable Adrenals/Urinary Tract: Stable 1 cm left adrenal nodule (image 61; series 2). Stable 7 mm right adrenal nodule (image 59; series 2). Hree demonstrated bilateral renal cysts, largest off the inferior pole of the right kidney. No hydronephrosis. Urinary bladder is decompressed. Stomach/Bowel: Patient status post distal ileal resection and right hemicolectomy. Similar-appearing 2.5 x 1.9 cm soft tissue mass involving the distal small bowel (image 98; series 2). Similar-appearing nodularity involving the peritoneum within the right pericolic gutter (image 84; series 2). Reference nodule measures 9 mm, similar to prior (image 84; series 2). Similar-appearing small nodules within the mesentery (image 91; series 2). Similar-appearing irregular soft tissue at the anastomotic suture line (image 85; series 2). Vascular/Lymphatic: Normal caliber abdominal aorta. Peripheral calcified atherosclerotic plaque. No retroperitoneal lymphadenopathy. Reproductive: Uterus is unremarkable. Interval decrease in soft tissue within the right adnexa which may represent improving fluid/postsurgical changes. Other: Small bowel containing anterior abdominal wall hernia. Musculoskeletal: Lumbar spine degenerative changes. No aggressive or acute appearing osseous lesions IMPRESSION: 1. Interval increase in size of right hepatic lobe lesion concerning hepatic metastatic disease. 2. Similar-appearing nodularity within the right pericolic gutter  which may represent metastatic disease or postsurgical changes. 3. Similar-appearing soft tissue mass within the distal small bowel concerning for residual disease. 4. Stable bilateral adrenal nodules. Recommend attention on follow-up. 5. Interval development of patchy ground-glass opacities predominantly within the upper lobes bilaterally, favored to be infectious/inflammatory in etiology. Recommend attention on follow-up. Electronically Signed   By: DLovey NewcomerM.D.   On: 07/27/2018 15:48  Ct Abdomen Pelvis W Contrast  Result Date: 07/27/2018 CLINICAL DATA:  Patient with history of colon cancer. Follow-up exam. EXAM: CT CHEST, ABDOMEN, AND PELVIS WITH CONTRAST TECHNIQUE: Multidetector CT imaging of the chest, abdomen and pelvis was performed following the standard protocol during bolus administration of intravenous contrast. CONTRAST:  173m ISOVUE-300 IOPAMIDOL (ISOVUE-300) INJECTION 61% COMPARISON:  CT CAP 04/20/2018 FINDINGS: CT CHEST FINDINGS Cardiovascular: Normal heart size. No pericardial effusion. Right anterior chest wall Port-A-Cath is present with tip terminating in the superior vena cava. Mediastinum/Nodes: No enlarged axillary, mediastinal or hilar lymphadenopathy. Small hiatal hernia. Lungs/Pleura: Central airways are patent. Interval development of patchy ground-glass nodularity predominately involving the upper lobes bilaterally. Bilateral lower lobe bandlike atelectasis. No pleural effusion or pneumothorax. Stable 4 mm right upper lobe nodule (image 37; series 4). Musculoskeletal: No aggressive or acute appearing osseous lesions. Thoracic spine degenerative changes. CT ABDOMEN PELVIS FINDINGS Hepatobiliary: Interval increase in size of 1.8 cm low-attenuation lesion right hepatic lobe (image 49; series 2), previously 1.3 cm. There is an additional 0.8 cm lesion within the hepatic dome (image 46; series 2). Re demonstrated vascular anomaly within the right hepatic lobe (image 55; series 2) most  compatible with portal venous shunt. Gallbladder is decompressed. No intrahepatic or extrahepatic biliary ductal dilatation. Pancreas: Unremarkable Spleen: Unremarkable Adrenals/Urinary Tract: Stable 1 cm left adrenal nodule (image 61; series 2). Stable 7 mm right adrenal nodule (image 59; series 2). Hree demonstrated bilateral renal cysts, largest off the inferior pole of the right kidney. No hydronephrosis. Urinary bladder is decompressed. Stomach/Bowel: Patient status post distal ileal resection and right hemicolectomy. Similar-appearing 2.5 x 1.9 cm soft tissue mass involving the distal small bowel (image 98; series 2). Similar-appearing nodularity involving the peritoneum within the right pericolic gutter (image 84; series 2). Reference nodule measures 9 mm, similar to prior (image 84; series 2). Similar-appearing small nodules within the mesentery (image 91; series 2). Similar-appearing irregular soft tissue at the anastomotic suture line (image 85; series 2). Vascular/Lymphatic: Normal caliber abdominal aorta. Peripheral calcified atherosclerotic plaque. No retroperitoneal lymphadenopathy. Reproductive: Uterus is unremarkable. Interval decrease in soft tissue within the right adnexa which may represent improving fluid/postsurgical changes. Other: Small bowel containing anterior abdominal wall hernia. Musculoskeletal: Lumbar spine degenerative changes. No aggressive or acute appearing osseous lesions IMPRESSION: 1. Interval increase in size of right hepatic lobe lesion concerning hepatic metastatic disease. 2. Similar-appearing nodularity within the right pericolic gutter which may represent metastatic disease or postsurgical changes. 3. Similar-appearing soft tissue mass within the distal small bowel concerning for residual disease. 4. Stable bilateral adrenal nodules. Recommend attention on follow-up. 5. Interval development of patchy ground-glass opacities predominantly within the upper lobes bilaterally,  favored to be infectious/inflammatory in etiology. Recommend attention on follow-up. Electronically Signed   By: DLovey NewcomerM.D.   On: 07/27/2018 15:48     Assessment and plan- Patient is a 72y.o. female with stage IV: Adenocarcinoma with liver metastases and peritoneal involvement. She progressed on FOLFOX/Avastin and is here for on treatment assessment prior to cycle 2 of FOLFIRI chemotherapy  Patient has had significant diarrhea and has lost 20 pounds in the last 2 weeks. She received irinotecan at reduced dose of 150 mg/meter square. I will hold off on treatment today. She will receive 40 meq IV potassium and 1L IVf. She will rtc in 1 week with bmp for more fluids and potassium and I will re evaluate her at that time  She has also dropped her hb from 12 to  10 after 1 cycle of FOLFIRI /avastin. Continue to monitor  I will consider restarting chemo in 2 weeks with further dose reductions  Patient has underlying schizophrenia but does have decision making capacity. She does not have HCP. She has 3 daughters and one of them has schizophrenia. Other one is occasionally involved in her care and has not seen her since jan 2019. I have recommended that her caregiver should get in touch with SW from group home to set up HCP for her. I will continue to address goals of care with her as well.   Visit Diagnosis 1. Colon adenocarcinoma ([place])   2. Hypokalemia      Dr. Randa Evens, MD, MPH [facility] [phone] 08/14/2018 12:23 PM

## 2018-08-14 NOTE — Progress Notes (Signed)
Pt's cognitive status has declined, pt soiled her bed all night long, soiled her clothes while walking up the hallway. Not eating as much.lost 10 lbs of wt. Diarrhea several times

## 2018-08-16 ENCOUNTER — Other Ambulatory Visit: Payer: Medicare Other

## 2018-08-21 ENCOUNTER — Inpatient Hospital Stay: Payer: Medicare Other

## 2018-08-21 ENCOUNTER — Inpatient Hospital Stay (HOSPITAL_BASED_OUTPATIENT_CLINIC_OR_DEPARTMENT_OTHER): Payer: Medicare Other | Admitting: Oncology

## 2018-08-21 ENCOUNTER — Encounter: Payer: Self-pay | Admitting: Oncology

## 2018-08-21 VITALS — BP 102/48 | HR 61 | Temp 96.0°F | Resp 18 | Ht 65.0 in | Wt 181.5 lb

## 2018-08-21 VITALS — BP 102/48 | HR 60 | Temp 97.8°F | Resp 17

## 2018-08-21 DIAGNOSIS — Z79899 Other long term (current) drug therapy: Secondary | ICD-10-CM

## 2018-08-21 DIAGNOSIS — Z5111 Encounter for antineoplastic chemotherapy: Secondary | ICD-10-CM

## 2018-08-21 DIAGNOSIS — C189 Malignant neoplasm of colon, unspecified: Secondary | ICD-10-CM

## 2018-08-21 DIAGNOSIS — E876 Hypokalemia: Secondary | ICD-10-CM

## 2018-08-21 DIAGNOSIS — Z9049 Acquired absence of other specified parts of digestive tract: Secondary | ICD-10-CM

## 2018-08-21 DIAGNOSIS — D6959 Other secondary thrombocytopenia: Secondary | ICD-10-CM

## 2018-08-21 DIAGNOSIS — E878 Other disorders of electrolyte and fluid balance, not elsewhere classified: Secondary | ICD-10-CM

## 2018-08-21 DIAGNOSIS — C786 Secondary malignant neoplasm of retroperitoneum and peritoneum: Secondary | ICD-10-CM | POA: Diagnosis not present

## 2018-08-21 DIAGNOSIS — K521 Toxic gastroenteritis and colitis: Secondary | ICD-10-CM

## 2018-08-21 DIAGNOSIS — E871 Hypo-osmolality and hyponatremia: Secondary | ICD-10-CM

## 2018-08-21 DIAGNOSIS — R911 Solitary pulmonary nodule: Secondary | ICD-10-CM

## 2018-08-21 DIAGNOSIS — C787 Secondary malignant neoplasm of liver and intrahepatic bile duct: Secondary | ICD-10-CM | POA: Diagnosis not present

## 2018-08-21 DIAGNOSIS — Z87891 Personal history of nicotine dependence: Secondary | ICD-10-CM

## 2018-08-21 DIAGNOSIS — C785 Secondary malignant neoplasm of large intestine and rectum: Secondary | ICD-10-CM

## 2018-08-21 DIAGNOSIS — C182 Malignant neoplasm of ascending colon: Secondary | ICD-10-CM | POA: Diagnosis not present

## 2018-08-21 DIAGNOSIS — T451X5A Adverse effect of antineoplastic and immunosuppressive drugs, initial encounter: Secondary | ICD-10-CM

## 2018-08-21 LAB — BASIC METABOLIC PANEL
Anion gap: 7 (ref 5–15)
BUN: 9 mg/dL (ref 8–23)
CO2: 25 mmol/L (ref 22–32)
CREATININE: 0.7 mg/dL (ref 0.44–1.00)
Calcium: 8.5 mg/dL — ABNORMAL LOW (ref 8.9–10.3)
Chloride: 107 mmol/L (ref 98–111)
GFR calc Af Amer: >60 mL/min (ref >60)
GFR calc non Af Amer: >60 mL/min (ref >60)
Glucose, Bld: 108 mg/dL — ABNORMAL HIGH (ref 70–99)
POTASSIUM: 3.5 mmol/L (ref 3.5–5.1)
Sodium: 139 mmol/L (ref 135–145)

## 2018-08-21 MED ORDER — SODIUM CHLORIDE 0.9 % IV SOLN
900.0000 mg | Freq: Once | INTRAVENOUS | Status: DC
  Filled 2018-08-21: qty 36

## 2018-08-21 MED ORDER — IRINOTECAN HCL CHEMO INJECTION 100 MG/5ML
125.0000 mg/m2 | Freq: Once | INTRAVENOUS | Status: AC
  Administered 2018-08-21: 240 mg via INTRAVENOUS
  Filled 2018-08-21: qty 12

## 2018-08-21 MED ORDER — DEXTROSE 5 % IV SOLN
Freq: Once | INTRAVENOUS | Status: AC
  Administered 2018-08-21: 11:00:00 via INTRAVENOUS
  Filled 2018-08-21: qty 250

## 2018-08-21 MED ORDER — LEUCOVORIN CALCIUM INJECTION 350 MG
800.0000 mg | Freq: Once | INTRAVENOUS | Status: AC
  Administered 2018-08-21: 800 mg via INTRAVENOUS
  Filled 2018-08-21: qty 5

## 2018-08-21 MED ORDER — DEXAMETHASONE SODIUM PHOSPHATE 10 MG/ML IJ SOLN
10.0000 mg | Freq: Once | INTRAMUSCULAR | Status: AC
  Administered 2018-08-21: 10 mg via INTRAVENOUS

## 2018-08-21 MED ORDER — PALONOSETRON HCL INJECTION 0.25 MG/5ML
0.2500 mg | Freq: Once | INTRAVENOUS | Status: AC
  Administered 2018-08-21: 0.25 mg via INTRAVENOUS
  Filled 2018-08-21: qty 5

## 2018-08-21 MED ORDER — SODIUM CHLORIDE 0.9 % IV SOLN
2400.0000 mg/m2 | INTRAVENOUS | Status: DC
  Administered 2018-08-21: 4800 mg via INTRAVENOUS
  Filled 2018-08-21: qty 96

## 2018-08-21 MED ORDER — ATROPINE SULFATE 1 MG/ML IJ SOLN
0.5000 mg | Freq: Once | INTRAMUSCULAR | Status: AC
  Administered 2018-08-21: 0.5 mg via INTRAVENOUS
  Filled 2018-08-21: qty 1

## 2018-08-21 MED ORDER — FLUOROURACIL CHEMO INJECTION 2.5 GM/50ML
400.0000 mg/m2 | Freq: Once | INTRAVENOUS | Status: AC
  Administered 2018-08-21: 800 mg via INTRAVENOUS
  Filled 2018-08-21: qty 16

## 2018-08-21 NOTE — Progress Notes (Signed)
No new changes noted today 

## 2018-08-21 NOTE — Patient Instructions (Signed)
Caregiver has imodium at home ; she knows to give it to patient at first sign of diarrhea.

## 2018-08-21 NOTE — Progress Notes (Signed)
Hematology/Oncology Consult note [facility]  [phone] [phone]  Patient Care Team: Lin Givens, MD as PCP - General (Family Medicine) Clent Jacks, RN as Registered Nurse   Name of the patient: Latoya Jones  [id number]  [birth year]   Date of visit: 08/21/18  Diagnosis- adenocarcinoma of the colon at least stage IIIC pT4b pN2aMxstatus post hemicolectomy.Subsequently patient found to have a peritoneal nodule as well as liver metastases and is currently being treated as stage IV colon cancer   Chief complaint/ Reason for visit-on treatment assessment prior to cycle 3 of FOLFIRI and Avastin chemotherapy  Heme/Onc history: Patient is a 72 year old female with a h/o developmental and mood disoredr who resides at a group home. She presented to the ER with symptoms of dizziness mostly when she stands up. She has had problems with constipation in the past and takes prune juice to relieve it. Patient is a poor historian at baseline and complained of abdominal pain in ER which led to CT abdomen. 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  CT chest without contrast showed tiny 2 mm right upper lobe pulmonary nodule. No other evidence of metastatic disease. Baseline CEA elevated at 4.8  Patient underwent exploratory laparotomy with right hemicolectomy and terminal ileum resection as well as separate small bowel resection on 12/06/2017. Operative findings showed a large cecal tumor with extramural spread to retroperitoneum mesentery, mesentery lymph nodes and parietal peritoneum. Dense inflammatory response of the retroperitoneum and small bowel.There was a second portion of small bowel that was involved and was resected. A small portion of free tumor was identified in the retroperitoneum which was sent off for examination as well.  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 Radial margin positive for invasive carcinoma 2 colon adenomas 2.5 and 1.8 cm of the ascending colon. Metastatic adenocarcinoma in 4 out of 13 lymph nodes. 2 tumor deposits Segment of small intestine with abscess which also showed adenocarcinoma invading the distal appendix with perforation. One mesenteric lymph node negative for malignancy. Retroperitoneal tumor removal was also positive for mucinous adenocarcinoma. Tumor was grade 2. Proximal and distal margins were negative.pT4bpN2a. MSI stable. KRAS mutation positive  Case discussed at tumor board. PET CT will not be done at this time as it may represent post surgical changes. Radiation oncology does not recommend adjuvant RT upon completion of chemotherapy.she was found to have recurrent small bowel soft tissue mass and peritoneal nodularity after 5 cycles of folfox. FOLFOX avastin to be given with a palliative intent. oxaliplatin discontinued after 5 cycles due to progressive thrombocytopenia requiring treatment interruptions  Scans in sept showed stable primary mass but increase in size of liver lesions. FOLFIRI avastin started on 07/31/18   Interval history-chemotherapy was held last week due to ongoing diarrhea which is now resolved with Imodium.  She is eating better and has gained back some weight.  She does have some ongoing fatigue but denies new complaints  ECOG PS- 2 Pain scale- 0 Opioid associated constipation- no  Review of systems- Review of Systems  Constitutional: Positive for malaise/fatigue. Negative for chills, fever and weight loss.  HENT: Negative for congestion, ear discharge and nosebleeds.   Eyes: Negative for blurred vision.  Respiratory: Negative for cough, hemoptysis, sputum production, shortness of breath and wheezing.   Cardiovascular: Negative for chest pain, palpitations, orthopnea and claudication.  Gastrointestinal: Negative for abdominal pain, blood in stool, constipation,  diarrhea, heartburn, melena,  nausea and vomiting.  Genitourinary: Negative for dysuria, flank pain, frequency, hematuria and urgency.  Musculoskeletal: Negative for back pain, joint pain and myalgias.  Skin: Negative for rash.  Neurological: Negative for dizziness, tingling, focal weakness, seizures, weakness and headaches.  Endo/Heme/Allergies: Does not bruise/bleed easily.  Psychiatric/Behavioral: Negative for depression and suicidal ideas. The patient does not have insomnia.       No Known Allergies   Past Medical History:  Diagnosis Date  . Cancer ([place])   . Colon cancer [facility])      Past Surgical History:  Procedure Laterality Date  . COLOSTOMY REVISION N/A 12/06/2017   Procedure: COLON RESECTION RIGHT;  Surgeon: Florene Glen, MD;  Location: [facility] ORS;  Service: General;  Laterality: N/A;  . DILATION AND CURETTAGE, DIAGNOSTIC / THERAPEUTIC    . PORTACATH PLACEMENT N/A 01/06/2018   Procedure: INSERTION PORT-A-CATH;  Surgeon: Vickie Epley, MD;  Location: [facility] ORS;  Service: General;  Laterality: N/A;    Social History   Socioeconomic History  . Marital status: Widowed    Spouse name: Not on file  . Number of children: Not on file  . Years of education: Not on file  . Highest education level: Not on file  Occupational History  . Not on file  Social Needs  . Financial resource strain: Patient refused  . Food insecurity:    Worry: Patient refused    Inability: Patient refused  . Transportation needs:    Medical: Patient refused    Non-medical: Patient refused  Tobacco Use  . Smoking status: Former Research scientist (life sciences)  . Smokeless tobacco: Never Used  Substance and Sexual Activity  . Alcohol use: No  . Drug use: No  . Sexual activity: Never  Lifestyle  . Physical activity:    Days per week: Patient refused    Minutes per session: Patient refused  . Stress: Patient refused  Relationships  . Social connections:    Talks on phone: Patient refused    Gets together:  Patient refused    Attends religious service: Patient refused    Active member of club or organization: Patient refused    Attends meetings of clubs or organizations: Patient refused    Relationship status: Patient refused  . Intimate partner violence:    Fear of current or ex partner: Patient refused    Emotionally abused: Patient refused    Physically abused: Patient refused    Forced sexual activity: Patient refused  Other Topics Concern  . Not on file  Social History Narrative  . Not on file    Family History  Problem Relation Age of Onset  . ALS Mother   . ALS Father      Current Outpatient Medications:  .  alendronate (FOSAMAX) 70 MG tablet, Take 70 mg by mouth once a week., Disp: , Rfl:  .  amLODipine (NORVASC) 10 MG tablet, Take 10 mg by mouth daily., Disp: , Rfl:  .  ARIPiprazole (ABILIFY) 5 MG tablet, Take 5 mg by mouth at bedtime. , Disp: , Rfl:  .  benztropine (COGENTIN) 0.5 MG tablet, Take 0.5 mg by mouth at bedtime., Disp: , Rfl:  .  Calcium 600-200 MG-UNIT tablet, Take 1 tablet by mouth daily., Disp: , Rfl:  .  divalproex (DEPAKOTE) 500 MG DR tablet, Take 500 mg by mouth at bedtime. , Disp: , Rfl:  .  loratadine (CLARITIN) 10 MG tablet, Take 1 tablet (10 mg total) by mouth daily as needed for allergies. Take 1 tablet the  day before pump removal, 1 tablet the day of pump removal and 1 tablet the day after pump removal. Take as directed with each cycle of  chemotherapy treatment, Disp: 30 tablet, Rfl: 1 .  memantine (NAMENDA XR) 14 MG CP24 24 hr capsule, Take 14 mg by mouth at bedtime. , Disp: , Rfl:  .  metoprolol succinate (TOPROL-XL) 50 MG 24 hr tablet, Take 50 mg by mouth every morning. Take with or immediately following a meal. , Disp: , Rfl:  .  Multiple Vitamin (MULTIVITAMIN WITH MINERALS) TABS tablet, Take 1 tablet by mouth daily., Disp: 30 tablet, Rfl: 0 .  perphenazine (TRILAFON) 4 MG tablet, Take 4 mg by mouth 2 (two) times daily., Disp: , Rfl:  .   acetaminophen (TYLENOL) 500 MG tablet, Take 1,000 mg by mouth every 8 (eight) hours as needed for mild pain., Disp: , Rfl:  .  calcium carbonate (TUMS - DOSED IN MG ELEMENTAL CALCIUM) 500 MG chewable tablet, Chew 1 tablet by mouth 2 (two) times daily as needed for indigestion or heartburn., Disp: , Rfl:  .  dexamethasone (DECADRON) 4 MG tablet, Take 1 tablet (4 mg total) by mouth 2 (two) times daily with a meal. Starting the day after chemotherapy for 2 days (Patient not taking: Reported on 08/21/2018), Disp: 30 tablet, Rfl: 0 .  lidocaine-prilocaine (EMLA) cream, Apply 1 application topically as needed. 1 hour prior to  each chemotherapy treatment. Place small amount over port site and place saran wrap over the cream to protect clothing (Patient not taking: Reported on 08/21/2018), Disp: 30 g, Rfl: 0 .  ondansetron (ZOFRAN) 8 MG tablet, Take 1 tablet (8 mg total) by mouth 2 (two) times daily as needed for nausea or vomiting. Can start using on day 3 after chemo (Patient not taking: Reported on 08/21/2018), Disp: 20 tablet, Rfl: 0 .  prochlorperazine (COMPAZINE) 10 MG tablet, Take 1 tablet (10 mg total) by mouth every 6 (six) hours as needed for nausea or vomiting. (Patient not taking: Reported on 08/21/2018), Disp: 30 tablet, Rfl: 0 No current facility-administered medications for this visit.   Facility-Administered Medications Ordered in Other Visits:  .  dextrose 5 % solution, , Intravenous, Once, Sindy Guadeloupe, MD .  fluorouracil (ADRUCIL) 4,800 mg in sodium chloride 0.9 % 54 mL chemo infusion, 2,400 mg/m2 (Treatment Plan Recorded), Intravenous, 1 day or 1 dose, Sindy Guadeloupe, MD, 4,800 mg at 08/21/18 1347 .  heparin lock flush 100 unit/mL, 500 Units, Intravenous, Once, Sindy Guadeloupe, MD .  heparin lock flush 100 unit/mL, 500 Units, Intracatheter, Once PRN, Sindy Guadeloupe, MD .  heparin lock flush 100 unit/mL, 500 Units, Intravenous, Once, Sindy Guadeloupe, MD .  sodium chloride flush (NS) 0.9 %  injection 10 mL, 10 mL, Intravenous, PRN, Sindy Guadeloupe, MD, 10 mL at 02/06/18 0845 .  sodium chloride flush (NS) 0.9 % injection 10 mL, 10 mL, Intracatheter, PRN, Sindy Guadeloupe, MD .  sodium chloride flush (NS) 0.9 % injection 10 mL, 10 mL, Intracatheter, PRN, Sindy Guadeloupe, MD, 10 mL at 03/06/18 0900 .  sodium chloride flush (NS) 0.9 % injection 10 mL, 10 mL, Intravenous, PRN, Sindy Guadeloupe, MD, 10 mL at 03/27/18 0909  Physical exam:  Vitals:   08/21/18 1011  BP: (!) 102/48  Pulse: 61  Resp: 18  Temp: (!) 96 F (35.6 C)  TempSrc: Tympanic  SpO2: 98%  Weight: 181 lb 8.8 oz (82.3 kg)  Height: '5\' 5"'  (1.651  m)   Physical Exam  Constitutional: She is oriented to person, place, and time. She appears well-developed and well-nourished.  She appears fatigued.  No acute distress  HENT:  Head: Normocephalic and atraumatic.  Eyes: Pupils are equal, round, and reactive to light. EOM are normal.  Neck: Normal range of motion.  Cardiovascular: Normal rate, regular rhythm and normal heart sounds.  Pulmonary/Chest: Effort normal and breath sounds normal.  Abdominal: Soft. Bowel sounds are normal.  Musculoskeletal: She exhibits edema (Bilateral ankle edema).  Neurological: She is alert and oriented to person, place, and time.  Skin: Skin is warm and dry.     CMP Latest Ref Rng & Units 08/21/2018  Glucose 70 - 99 mg/dL 108(H)  BUN 8 - 23 mg/dL 9  Creatinine 0.44 - 1.00 mg/dL 0.70  Sodium 135 - 145 mmol/L 139  Potassium 3.5 - 5.1 mmol/L 3.5  Chloride 98 - 111 mmol/L 107  CO2 22 - 32 mmol/L 25  Calcium 8.9 - 10.3 mg/dL 8.5(L)  Total Protein 6.5 - 8.1 g/dL -  Total Bilirubin 0.3 - 1.2 mg/dL -  Alkaline Phos 38 - 126 U/L -  AST 15 - 41 U/L -  ALT 0 - 44 U/L -   CBC Latest Ref Rng & Units 08/14/2018  WBC 3.6 - 11.0 K/uL 7.8  Hemoglobin 12.0 - 16.0 g/dL 10.9(L)  Hematocrit 35.0 - 47.0 % 32.3(L)  Platelets 150 - 440 K/uL 115(L)    No images are attached to the encounter.  Ct Chest  W Contrast  Result Date: 07/27/2018 CLINICAL DATA:  Patient with history of colon cancer. Follow-up exam. EXAM: CT CHEST, ABDOMEN, AND PELVIS WITH CONTRAST TECHNIQUE: Multidetector CT imaging of the chest, abdomen and pelvis was performed following the standard protocol during bolus administration of intravenous contrast. CONTRAST:  181m ISOVUE-300 IOPAMIDOL (ISOVUE-300) INJECTION 61% COMPARISON:  CT CAP 04/20/2018 FINDINGS: CT CHEST FINDINGS Cardiovascular: Normal heart size. No pericardial effusion. Right anterior chest wall Port-A-Cath is present with tip terminating in the superior vena cava. Mediastinum/Nodes: No enlarged axillary, mediastinal or hilar lymphadenopathy. Small hiatal hernia. Lungs/Pleura: Central airways are patent. Interval development of patchy ground-glass nodularity predominately involving the upper lobes bilaterally. Bilateral lower lobe bandlike atelectasis. No pleural effusion or pneumothorax. Stable 4 mm right upper lobe nodule (image 37; series 4). Musculoskeletal: No aggressive or acute appearing osseous lesions. Thoracic spine degenerative changes. CT ABDOMEN PELVIS FINDINGS Hepatobiliary: Interval increase in size of 1.8 cm low-attenuation lesion right hepatic lobe (image 49; series 2), previously 1.3 cm. There is an additional 0.8 cm lesion within the hepatic dome (image 46; series 2). Re demonstrated vascular anomaly within the right hepatic lobe (image 55; series 2) most compatible with portal venous shunt. Gallbladder is decompressed. No intrahepatic or extrahepatic biliary ductal dilatation. Pancreas: Unremarkable Spleen: Unremarkable Adrenals/Urinary Tract: Stable 1 cm left adrenal nodule (image 61; series 2). Stable 7 mm right adrenal nodule (image 59; series 2). Hree demonstrated bilateral renal cysts, largest off the inferior pole of the right kidney. No hydronephrosis. Urinary bladder is decompressed. Stomach/Bowel: Patient status post distal ileal resection and right  hemicolectomy. Similar-appearing 2.5 x 1.9 cm soft tissue mass involving the distal small bowel (image 98; series 2). Similar-appearing nodularity involving the peritoneum within the right pericolic gutter (image 84; series 2). Reference nodule measures 9 mm, similar to prior (image 84; series 2). Similar-appearing small nodules within the mesentery (image 91; series 2). Similar-appearing irregular soft tissue at the anastomotic suture line (image 85; series 2).  Vascular/Lymphatic: Normal caliber abdominal aorta. Peripheral calcified atherosclerotic plaque. No retroperitoneal lymphadenopathy. Reproductive: Uterus is unremarkable. Interval decrease in soft tissue within the right adnexa which may represent improving fluid/postsurgical changes. Other: Small bowel containing anterior abdominal wall hernia. Musculoskeletal: Lumbar spine degenerative changes. No aggressive or acute appearing osseous lesions IMPRESSION: 1. Interval increase in size of right hepatic lobe lesion concerning hepatic metastatic disease. 2. Similar-appearing nodularity within the right pericolic gutter which may represent metastatic disease or postsurgical changes. 3. Similar-appearing soft tissue mass within the distal small bowel concerning for residual disease. 4. Stable bilateral adrenal nodules. Recommend attention on follow-up. 5. Interval development of patchy ground-glass opacities predominantly within the upper lobes bilaterally, favored to be infectious/inflammatory in etiology. Recommend attention on follow-up. Electronically Signed   By: Lovey Newcomer M.D.   On: 07/27/2018 15:48   Ct Abdomen Pelvis W Contrast  Result Date: 07/27/2018 CLINICAL DATA:  Patient with history of colon cancer. Follow-up exam. EXAM: CT CHEST, ABDOMEN, AND PELVIS WITH CONTRAST TECHNIQUE: Multidetector CT imaging of the chest, abdomen and pelvis was performed following the standard protocol during bolus administration of intravenous contrast. CONTRAST:   18m ISOVUE-300 IOPAMIDOL (ISOVUE-300) INJECTION 61% COMPARISON:  CT CAP 04/20/2018 FINDINGS: CT CHEST FINDINGS Cardiovascular: Normal heart size. No pericardial effusion. Right anterior chest wall Port-A-Cath is present with tip terminating in the superior vena cava. Mediastinum/Nodes: No enlarged axillary, mediastinal or hilar lymphadenopathy. Small hiatal hernia. Lungs/Pleura: Central airways are patent. Interval development of patchy ground-glass nodularity predominately involving the upper lobes bilaterally. Bilateral lower lobe bandlike atelectasis. No pleural effusion or pneumothorax. Stable 4 mm right upper lobe nodule (image 37; series 4). Musculoskeletal: No aggressive or acute appearing osseous lesions. Thoracic spine degenerative changes. CT ABDOMEN PELVIS FINDINGS Hepatobiliary: Interval increase in size of 1.8 cm low-attenuation lesion right hepatic lobe (image 49; series 2), previously 1.3 cm. There is an additional 0.8 cm lesion within the hepatic dome (image 46; series 2). Re demonstrated vascular anomaly within the right hepatic lobe (image 55; series 2) most compatible with portal venous shunt. Gallbladder is decompressed. No intrahepatic or extrahepatic biliary ductal dilatation. Pancreas: Unremarkable Spleen: Unremarkable Adrenals/Urinary Tract: Stable 1 cm left adrenal nodule (image 61; series 2). Stable 7 mm right adrenal nodule (image 59; series 2). Hree demonstrated bilateral renal cysts, largest off the inferior pole of the right kidney. No hydronephrosis. Urinary bladder is decompressed. Stomach/Bowel: Patient status post distal ileal resection and right hemicolectomy. Similar-appearing 2.5 x 1.9 cm soft tissue mass involving the distal small bowel (image 98; series 2). Similar-appearing nodularity involving the peritoneum within the right pericolic gutter (image 84; series 2). Reference nodule measures 9 mm, similar to prior (image 84; series 2). Similar-appearing small nodules within  the mesentery (image 91; series 2). Similar-appearing irregular soft tissue at the anastomotic suture line (image 85; series 2). Vascular/Lymphatic: Normal caliber abdominal aorta. Peripheral calcified atherosclerotic plaque. No retroperitoneal lymphadenopathy. Reproductive: Uterus is unremarkable. Interval decrease in soft tissue within the right adnexa which may represent improving fluid/postsurgical changes. Other: Small bowel containing anterior abdominal wall hernia. Musculoskeletal: Lumbar spine degenerative changes. No aggressive or acute appearing osseous lesions IMPRESSION: 1. Interval increase in size of right hepatic lobe lesion concerning hepatic metastatic disease. 2. Similar-appearing nodularity within the right pericolic gutter which may represent metastatic disease or postsurgical changes. 3. Similar-appearing soft tissue mass within the distal small bowel concerning for residual disease. 4. Stable bilateral adrenal nodules. Recommend attention on follow-up. 5. Interval development of patchy ground-glass opacities predominantly within  the upper lobes bilaterally, favored to be infectious/inflammatory in etiology. Recommend attention on follow-up. Electronically Signed   By: Lovey Newcomer M.D.   On: 07/27/2018 15:48     Assessment and plan- Patient is a 72 y.o. female with stage IV: Adenocarcinoma with peritoneal metastases and possible liver metastases here for on treatment assessment prior to cycle 2 of FOLFIRI Avastin chemotherapy  Patient's blood pressure is now in the 100s.  I have asked her caregiver to hold patient's amlodipine unless her systolic blood pressure is consistently more than 140.  We are unable to obtain urine protein as patient invariably has a small bowel movement regardless of whether she has diarrhea or not when she tries to give [place] a urine sample.  She will proceed with a Avastin today  Patient did have significant diarrhea with her first dose of irinotecan at 150 mg/m.   Today her potassium is back to normal and she is no longer having diarrhea.  She is also gained back some weight.  I will therefore restart her FOLFIRI chemotherapy but reduce her irinotecan dose further to 125 mg/m.  Patient does have some underlying cognitive issues due to schizophrenia and current goal with treatment is to maximize her quality of life regain her independence.  I will therefore stretch her FOLFIRI chemotherapy to every 3 weeks instead of every 2 weeks  I will see her back in 1 week's time with CBC and CMP for possible fluids and/or potassium.  We will also schedule her for possible fluids in 2 weeks time.  Her next chemotherapy will be 3 weeks from now   Visit Diagnosis 1. Colon adenocarcinoma ([place])   2. Chemotherapy induced diarrhea   3. Encounter for antineoplastic chemotherapy      Dr. Randa Evens, MD, MPH [facility] [phone] 08/21/2018 2:31 PM

## 2018-08-23 ENCOUNTER — Inpatient Hospital Stay: Payer: Medicare Other | Attending: Oncology

## 2018-08-23 VITALS — BP 130/79 | HR 57 | Temp 96.1°F | Resp 18

## 2018-08-23 DIAGNOSIS — C787 Secondary malignant neoplasm of liver and intrahepatic bile duct: Secondary | ICD-10-CM | POA: Insufficient documentation

## 2018-08-23 DIAGNOSIS — Z79899 Other long term (current) drug therapy: Secondary | ICD-10-CM | POA: Diagnosis not present

## 2018-08-23 DIAGNOSIS — C182 Malignant neoplasm of ascending colon: Secondary | ICD-10-CM | POA: Diagnosis not present

## 2018-08-23 DIAGNOSIS — C786 Secondary malignant neoplasm of retroperitoneum and peritoneum: Secondary | ICD-10-CM | POA: Diagnosis not present

## 2018-08-23 DIAGNOSIS — D6481 Anemia due to antineoplastic chemotherapy: Secondary | ICD-10-CM | POA: Insufficient documentation

## 2018-08-23 DIAGNOSIS — Z9049 Acquired absence of other specified parts of digestive tract: Secondary | ICD-10-CM | POA: Insufficient documentation

## 2018-08-23 DIAGNOSIS — Z5111 Encounter for antineoplastic chemotherapy: Secondary | ICD-10-CM | POA: Diagnosis present

## 2018-08-23 DIAGNOSIS — E871 Hypo-osmolality and hyponatremia: Secondary | ICD-10-CM | POA: Diagnosis not present

## 2018-08-23 DIAGNOSIS — C785 Secondary malignant neoplasm of large intestine and rectum: Secondary | ICD-10-CM | POA: Diagnosis not present

## 2018-08-23 DIAGNOSIS — T451X5A Adverse effect of antineoplastic and immunosuppressive drugs, initial encounter: Secondary | ICD-10-CM | POA: Insufficient documentation

## 2018-08-23 DIAGNOSIS — Z87891 Personal history of nicotine dependence: Secondary | ICD-10-CM | POA: Insufficient documentation

## 2018-08-23 DIAGNOSIS — D696 Thrombocytopenia, unspecified: Secondary | ICD-10-CM | POA: Insufficient documentation

## 2018-08-23 DIAGNOSIS — E878 Other disorders of electrolyte and fluid balance, not elsewhere classified: Secondary | ICD-10-CM | POA: Insufficient documentation

## 2018-08-23 DIAGNOSIS — R911 Solitary pulmonary nodule: Secondary | ICD-10-CM | POA: Insufficient documentation

## 2018-08-23 DIAGNOSIS — R5383 Other fatigue: Secondary | ICD-10-CM | POA: Diagnosis not present

## 2018-08-23 DIAGNOSIS — C189 Malignant neoplasm of colon, unspecified: Secondary | ICD-10-CM

## 2018-08-23 MED ORDER — PEGFILGRASTIM INJECTION 6 MG/0.6ML ~~LOC~~
6.0000 mg | PREFILLED_SYRINGE | Freq: Once | SUBCUTANEOUS | Status: AC
  Administered 2018-08-23: 6 mg via SUBCUTANEOUS

## 2018-08-23 MED ORDER — SODIUM CHLORIDE 0.9% FLUSH
10.0000 mL | INTRAVENOUS | Status: DC | PRN
  Administered 2018-08-23: 10 mL
  Filled 2018-08-23: qty 10

## 2018-08-23 MED ORDER — HEPARIN SOD (PORK) LOCK FLUSH 100 UNIT/ML IV SOLN
500.0000 [IU] | Freq: Once | INTRAVENOUS | Status: AC | PRN
  Administered 2018-08-23: 500 [IU]

## 2018-08-28 ENCOUNTER — Inpatient Hospital Stay: Payer: Medicare Other

## 2018-08-28 ENCOUNTER — Encounter: Payer: Self-pay | Admitting: Oncology

## 2018-08-28 ENCOUNTER — Inpatient Hospital Stay (HOSPITAL_BASED_OUTPATIENT_CLINIC_OR_DEPARTMENT_OTHER): Payer: Medicare Other | Admitting: Oncology

## 2018-08-28 VITALS — BP 115/52 | HR 64 | Temp 97.7°F | Resp 18 | Ht 65.0 in | Wt 178.8 lb

## 2018-08-28 DIAGNOSIS — E871 Hypo-osmolality and hyponatremia: Secondary | ICD-10-CM

## 2018-08-28 DIAGNOSIS — C189 Malignant neoplasm of colon, unspecified: Secondary | ICD-10-CM

## 2018-08-28 DIAGNOSIS — Z9049 Acquired absence of other specified parts of digestive tract: Secondary | ICD-10-CM

## 2018-08-28 DIAGNOSIS — Z87891 Personal history of nicotine dependence: Secondary | ICD-10-CM

## 2018-08-28 DIAGNOSIS — Z5111 Encounter for antineoplastic chemotherapy: Secondary | ICD-10-CM | POA: Diagnosis not present

## 2018-08-28 DIAGNOSIS — D6481 Anemia due to antineoplastic chemotherapy: Secondary | ICD-10-CM

## 2018-08-28 DIAGNOSIS — D696 Thrombocytopenia, unspecified: Secondary | ICD-10-CM

## 2018-08-28 DIAGNOSIS — R5383 Other fatigue: Secondary | ICD-10-CM

## 2018-08-28 DIAGNOSIS — C785 Secondary malignant neoplasm of large intestine and rectum: Secondary | ICD-10-CM | POA: Diagnosis not present

## 2018-08-28 DIAGNOSIS — R911 Solitary pulmonary nodule: Secondary | ICD-10-CM

## 2018-08-28 DIAGNOSIS — C787 Secondary malignant neoplasm of liver and intrahepatic bile duct: Secondary | ICD-10-CM

## 2018-08-28 DIAGNOSIS — E878 Other disorders of electrolyte and fluid balance, not elsewhere classified: Secondary | ICD-10-CM

## 2018-08-28 DIAGNOSIS — C786 Secondary malignant neoplasm of retroperitoneum and peritoneum: Secondary | ICD-10-CM

## 2018-08-28 DIAGNOSIS — C182 Malignant neoplasm of ascending colon: Secondary | ICD-10-CM | POA: Diagnosis not present

## 2018-08-28 DIAGNOSIS — Z79899 Other long term (current) drug therapy: Secondary | ICD-10-CM

## 2018-08-28 DIAGNOSIS — T451X5A Adverse effect of antineoplastic and immunosuppressive drugs, initial encounter: Secondary | ICD-10-CM

## 2018-08-28 DIAGNOSIS — D6959 Other secondary thrombocytopenia: Secondary | ICD-10-CM

## 2018-08-28 LAB — CBC WITH DIFFERENTIAL/PLATELET
Basophils Absolute: 0.2 10*3/uL — ABNORMAL HIGH (ref 0–0.1)
Basophils Relative: 3 %
Eosinophils Absolute: 0 10*3/uL (ref 0–0.7)
Eosinophils Relative: 0 %
HEMATOCRIT: 31.7 % — AB (ref 35.0–47.0)
Hemoglobin: 10.7 g/dL — ABNORMAL LOW (ref 12.0–16.0)
LYMPHS ABS: 1.9 10*3/uL (ref 1.0–3.6)
LYMPHS PCT: 28 %
MCH: 35.3 pg — AB (ref 26.0–34.0)
MCHC: 33.7 g/dL (ref 32.0–36.0)
MCV: 104.9 fL — AB (ref 80.0–100.0)
MONO ABS: 0.6 10*3/uL (ref 0.2–0.9)
Monocytes Relative: 9 %
Neutro Abs: 4.1 10*3/uL (ref 1.4–6.5)
Neutrophils Relative %: 60 %
Platelets: 39 10*3/uL — ABNORMAL LOW (ref 150–440)
RBC: 3.02 MIL/uL — ABNORMAL LOW (ref 3.80–5.20)
RDW: 16.7 % — AB (ref 11.5–14.5)
WBC: 6.9 10*3/uL (ref 3.6–11.0)

## 2018-08-28 LAB — BASIC METABOLIC PANEL
Anion gap: 10 (ref 5–15)
BUN: 14 mg/dL (ref 8–23)
CO2: 23 mmol/L (ref 22–32)
Calcium: 9.3 mg/dL (ref 8.9–10.3)
Chloride: 105 mmol/L (ref 98–111)
Creatinine, Ser: 0.93 mg/dL (ref 0.44–1.00)
GFR calc Af Amer: >60 mL/min (ref >60)
GFR calc non Af Amer: >60 mL/min (ref >60)
GLUCOSE: 112 mg/dL — AB (ref 70–99)
Potassium: 4.1 mmol/L (ref 3.5–5.1)
Sodium: 138 mmol/L (ref 135–145)

## 2018-08-28 MED ORDER — HEPARIN SOD (PORK) LOCK FLUSH 100 UNIT/ML IV SOLN
INTRAVENOUS | Status: AC
  Filled 2018-08-28: qty 5

## 2018-08-28 MED ORDER — SODIUM CHLORIDE 0.9% FLUSH
10.0000 mL | INTRAVENOUS | Status: DC | PRN
  Administered 2018-08-28: 10 mL via INTRAVENOUS
  Filled 2018-08-28: qty 10

## 2018-08-28 MED ORDER — HEPARIN SOD (PORK) LOCK FLUSH 100 UNIT/ML IV SOLN
500.0000 [IU] | Freq: Once | INTRAVENOUS | Status: AC
  Administered 2018-08-28: 500 [IU] via INTRAVENOUS

## 2018-08-28 NOTE — Progress Notes (Signed)
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 She presented to the ER with symptoms of dizziness mostly when she stands up. She has had problems with constipation in the past and takes prune juice to relieve it. Patient is a poor historian at baseline and complained of abdominal pain in ER which led to CT abdomen. 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  CT chest without contrast showed tiny 2 mm right upper lobe pulmonary nodule. No other evidence of metastatic disease. Baseline CEA elevated at 4.8  Patient underwent exploratory laparotomy with right hemicolectomy and terminal ileum resection as well as separate small bowel resection on 12/06/2017. Operative findings showed a large cecal tumor with extramural spread to retroperitoneum mesentery, mesentery lymph nodes and parietal peritoneum. Dense inflammatory response of the retroperitoneum and small bowel.There was a second portion of small bowel that was involved and was resected. A small portion of free tumor was identified in the retroperitoneum which was sent off for examination as well.  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  Radial margin positive for invasive carcinoma 2 colon adenomas 2.5 and 1.8 cm of the ascending colon. Metastatic adenocarcinoma in 4 out of 13 lymph nodes. 2 tumor deposits Segment of small intestine with abscess which also showed adenocarcinoma invading the distal appendix with perforation. One mesenteric lymph node negative for malignancy. Retroperitoneal tumor removal was also positive for mucinous adenocarcinoma. Tumor was grade 2. Proximal and distal margins were negative.pT4bpN2a. MSI stable. KRAS mutation positive  Case discussed at tumor board. PET CT will not be done at this time as it may represent post surgical changes. Radiation oncology does not recommend adjuvant RT upon completion of chemotherapy.she was found to have recurrent small bowel soft tissue mass and peritoneal nodularity after 5 cycles of folfox. FOLFOX avastin to be given with a palliative intent. oxaliplatin discontinued after 5 cycles due to progressive thrombocytopenia requiring treatment interruptions  Scans in sept showed stable primary mass but increase in size of liver lesions. FOLFIRI avastin started on 07/31/18  Interval history-she feels fatigued but has not had any diarrhea.  Her appetite is good.  Yesterday while getting up out of her bed she had a fall because her legs got twisted.  She did not hit her head.  ECOG PS- 1 Pain scale- 0 Opioid associated constipation- no  Review of systems- Review of Systems  Constitutional: Positive for malaise/fatigue. Negative for chills, fever and weight loss.  HENT: Negative for congestion, ear discharge and nosebleeds.   Eyes: Negative for blurred vision.  Respiratory: Negative for cough, hemoptysis, sputum production, shortness of breath and wheezing.   Cardiovascular: Negative for chest pain, palpitations, orthopnea and claudication.  Gastrointestinal: Negative for abdominal pain, blood in stool, constipation, diarrhea, heartburn, melena, nausea  and vomiting.    Genitourinary: Negative for dysuria, flank pain, frequency, hematuria and urgency.  Musculoskeletal: Negative for back pain, joint pain and myalgias.  Skin: Negative for rash.  Neurological: Negative for dizziness, tingling, focal weakness, seizures, weakness and headaches.  Endo/Heme/Allergies: Does not bruise/bleed easily.  Psychiatric/Behavioral: Negative for depression and suicidal ideas. The patient does not have insomnia.        No Known Allergies   Past Medical History:  Diagnosis Date  . Cancer ([place])   . Colon cancer [facility])      Past Surgical History:  Procedure Laterality Date  . COLOSTOMY REVISION N/A 12/06/2017   Procedure: COLON RESECTION RIGHT;  Surgeon: Florene Glen, MD;  Location: [facility] ORS;  Service: General;  Laterality: N/A;  . DILATION AND CURETTAGE, DIAGNOSTIC / THERAPEUTIC    . PORTACATH PLACEMENT N/A 01/06/2018   Procedure: INSERTION PORT-A-CATH;  Surgeon: Vickie Epley, MD;  Location: [facility] ORS;  Service: General;  Laterality: N/A;    Social History   Socioeconomic History  . Marital status: Widowed    Spouse name: Not on file  . Number of children: Not on file  . Years of education: Not on file  . Highest education level: Not on file  Occupational History  . Not on file  Social Needs  . Financial resource strain: Patient refused  . Food insecurity:    Worry: Patient refused    Inability: Patient refused  . Transportation needs:    Medical: Patient refused    Non-medical: Patient refused  Tobacco Use  . Smoking status: Former Research scientist (life sciences)  . Smokeless tobacco: Never Used  Substance and Sexual Activity  . Alcohol use: No  . Drug use: No  . Sexual activity: Never  Lifestyle  . Physical activity:    Days per week: Patient refused    Minutes per session: Patient refused  . Stress: Patient refused  Relationships  . Social connections:    Talks on phone: Patient refused    Gets together: Patient refused    Attends religious service:  Patient refused    Active member of club or organization: Patient refused    Attends meetings of clubs or organizations: Patient refused    Relationship status: Patient refused  . Intimate partner violence:    Fear of current or ex partner: Patient refused    Emotionally abused: Patient refused    Physically abused: Patient refused    Forced sexual activity: Patient refused  Other Topics Concern  . Not on file  Social History Narrative  . Not on file    Family History  Problem Relation Age of Onset  . ALS Mother   . ALS Father      Current Outpatient Medications:  .  acetaminophen (TYLENOL) 500 MG tablet, Take 1,000 mg by mouth every 8 (eight) hours as needed for mild pain., Disp: , Rfl:  .  alendronate (FOSAMAX) 70 MG tablet, Take 70 mg by mouth once a week., Disp: , Rfl:  .  amLODipine (NORVASC) 10 MG tablet, Take 10 mg by mouth daily., Disp: , Rfl:  .  ARIPiprazole (ABILIFY) 5 MG tablet, Take 5 mg by mouth at bedtime. , Disp: , Rfl:  .  benztropine (COGENTIN) 0.5 MG tablet, Take 0.5 mg by mouth at bedtime., Disp: , Rfl:  .  Calcium 600-200 MG-UNIT tablet, Take 1 tablet by mouth daily., Disp: , Rfl:  .  calcium carbonate (TUMS - DOSED IN MG ELEMENTAL CALCIUM) 500 MG chewable tablet, Chew 1  tablet by mouth 2 (two) times daily as needed for indigestion or heartburn., Disp: , Rfl:  .  dexamethasone (DECADRON) 4 MG tablet, Take 1 tablet (4 mg total) by mouth 2 (two) times daily with a meal. Starting the day after chemotherapy for 2 days (Patient not taking: Reported on 08/21/2018), Disp: 30 tablet, Rfl: 0 .  divalproex (DEPAKOTE) 500 MG DR tablet, Take 500 mg by mouth at bedtime. , Disp: , Rfl:  .  lidocaine-prilocaine (EMLA) cream, Apply 1 application topically as needed. 1 hour prior to  each chemotherapy treatment. Place small amount over port site and place saran wrap over the cream to protect clothing (Patient not taking: Reported on 08/21/2018), Disp: 30 g, Rfl: 0 .  loratadine  (CLARITIN) 10 MG tablet, Take 1 tablet (10 mg total) by mouth daily as needed for allergies. Take 1 tablet the day before pump removal, 1 tablet the day of pump removal and 1 tablet the day after pump removal. Take as directed with each cycle of  chemotherapy treatment, Disp: 30 tablet, Rfl: 1 .  memantine (NAMENDA XR) 14 MG CP24 24 hr capsule, Take 14 mg by mouth at bedtime. , Disp: , Rfl:  .  metoprolol succinate (TOPROL-XL) 50 MG 24 hr tablet, Take 50 mg by mouth every morning. Take with or immediately following a meal. , Disp: , Rfl:  .  Multiple Vitamin (MULTIVITAMIN WITH MINERALS) TABS tablet, Take 1 tablet by mouth daily., Disp: 30 tablet, Rfl: 0 .  ondansetron (ZOFRAN) 8 MG tablet, Take 1 tablet (8 mg total) by mouth 2 (two) times daily as needed for nausea or vomiting. Can start using on day 3 after chemo (Patient not taking: Reported on 08/21/2018), Disp: 20 tablet, Rfl: 0 .  perphenazine (TRILAFON) 4 MG tablet, Take 4 mg by mouth 2 (two) times daily., Disp: , Rfl:  .  prochlorperazine (COMPAZINE) 10 MG tablet, Take 1 tablet (10 mg total) by mouth every 6 (six) hours as needed for nausea or vomiting. (Patient not taking: Reported on 08/21/2018), Disp: 30 tablet, Rfl: 0 No current facility-administered medications for this visit.   Facility-Administered Medications Ordered in Other Visits:  .  dextrose 5 % solution, , Intravenous, Once, Randa Evens C, MD .  heparin lock flush 100 unit/mL, 500 Units, Intravenous, Once, Sindy Guadeloupe, MD .  heparin lock flush 100 unit/mL, 500 Units, Intracatheter, Once PRN, Sindy Guadeloupe, MD .  heparin lock flush 100 unit/mL, 500 Units, Intravenous, Once, Sindy Guadeloupe, MD .  sodium chloride flush (NS) 0.9 % injection 10 mL, 10 mL, Intravenous, PRN, Sindy Guadeloupe, MD, 10 mL at 02/06/18 0845 .  sodium chloride flush (NS) 0.9 % injection 10 mL, 10 mL, Intracatheter, PRN, Sindy Guadeloupe, MD .  sodium chloride flush (NS) 0.9 % injection 10 mL, 10 mL,  Intracatheter, PRN, Sindy Guadeloupe, MD, 10 mL at 03/06/18 0900 .  sodium chloride flush (NS) 0.9 % injection 10 mL, 10 mL, Intravenous, PRN, Sindy Guadeloupe, MD, 10 mL at 03/27/18 0909  Physical exam:  Vitals:   08/28/18 1119  BP: (!) 115/52  Pulse: 64  Resp: 18  Temp: 97.7 F (36.5 C)  TempSrc: Tympanic  SpO2: 99%  Weight: 178 lb 12.7 oz (81.1 kg)  Height: _0  (1.651 m)   Physical Exam  Constitutional: She is oriented to person, place, and time.  HENT:  Head: Normocephalic and atraumatic.  Eyes: Pupils are equal, round, and reactive to light. EOM are  normal.  Neck: Normal range of motion.  Cardiovascular: Normal rate, regular rhythm and normal heart sounds.  Pulmonary/Chest: Effort normal and breath sounds normal.  Abdominal: Soft. Bowel sounds are normal.  Neurological: She is alert and oriented to person, place, and time.  Skin: Skin is warm and dry.     CMP Latest Ref Rng & Units 08/21/2018  Glucose 70 - 99 mg/dL 108(H)  BUN 8 - 23 mg/dL 9  Creatinine 0.44 - 1.00 mg/dL 0.70  Sodium 135 - 145 mmol/L 139  Potassium 3.5 - 5.1 mmol/L 3.5  Chloride 98 - 111 mmol/L 107  CO2 22 - 32 mmol/L 25  Calcium 8.9 - 10.3 mg/dL 8.5(L)  Total Protein 6.5 - 8.1 g/dL -  Total Bilirubin 0.3 - 1.2 mg/dL -  Alkaline Phos 38 - 126 U/L -  AST 15 - 41 U/L -  ALT 0 - 44 U/L -   CBC Latest Ref Rng & Units 08/28/2018  WBC 3.6 - 11.0 K/uL 6.9  Hemoglobin 12.0 - 16.0 g/dL 10.7(L)  Hematocrit 35.0 - 47.0 % 31.7(L)  Platelets 150 - 440 K/uL 39(L)     Assessment and plan- Patient is a 72 y.o. female  with stage IV: Adenocarcinoma with peritoneal metastases and possible liver metastases status post 2 cycles of FOLFIRI Avastin here for toxicity check  Patient received dose reduced irinotecan at 125 mg/m last week.  She tolerated this dose relatively well without any significant diarrhea.  Today her blood pressure is better and her potassium is normal.  She continues to hold her amlodipine.  She  is also not lost any significant weight.  She does a significant thrombocytopenia with a platelet count of 39.  Patient has been getting fatigued as she has been on chemotherapy for more than 7 months.  I would therefore like to continue FOLFIRI Avastin every 3 weeks instead of every 2 weeks to be able to give it to her a consistent manner.  I am also concerned that her platelet counts will not be sufficiently up next week  Patient's caregiver states that she is not available next week that is on 09/11/2018 2 bring her for pump disconnect.  She therefore has an appointment to see [place] on 09/18/2018 for her next cycle of FOLFOX.  I have asked her to see if she can bring the patient to [facility] instead for FOLFOX and then come to [facility] for pump disconnect on day 3 patient's caregiver will let us know if she is a 10/28 appointment or change it to the week prior at [facility]  I have reviewed fall precautions with patient and caregiver given her thrombocytopenia.  She does have moderate normocytic anemia also secondary to chemotherapy which we will continue to monitor   Visit Diagnosis 1. Colon adenocarcinoma ([place])   2. Chemotherapy-induced thrombocytopenia   3. Antineoplastic chemotherapy induced anemia      Dr. Randa Evens, MD, MPH [facility] [phone] 08/28/2018 12:06 PM

## 2018-08-28 NOTE — Progress Notes (Signed)
No new changes noted today 

## 2018-09-04 ENCOUNTER — Other Ambulatory Visit: Payer: Medicare Other

## 2018-09-04 ENCOUNTER — Ambulatory Visit: Payer: Medicare Other

## 2018-09-04 ENCOUNTER — Ambulatory Visit: Payer: Medicare Other | Admitting: Oncology

## 2018-09-11 ENCOUNTER — Ambulatory Visit: Payer: Medicare Other | Admitting: Oncology

## 2018-09-18 ENCOUNTER — Encounter: Payer: Self-pay | Admitting: Oncology

## 2018-09-18 ENCOUNTER — Inpatient Hospital Stay: Payer: Medicare Other

## 2018-09-18 ENCOUNTER — Inpatient Hospital Stay (HOSPITAL_BASED_OUTPATIENT_CLINIC_OR_DEPARTMENT_OTHER): Payer: Medicare Other | Admitting: Oncology

## 2018-09-18 VITALS — BP 120/74 | HR 58 | Temp 97.1°F | Resp 18 | Ht 65.0 in | Wt 177.7 lb

## 2018-09-18 VITALS — BP 144/79 | HR 80 | Temp 96.7°F | Resp 18

## 2018-09-18 DIAGNOSIS — D696 Thrombocytopenia, unspecified: Secondary | ICD-10-CM

## 2018-09-18 DIAGNOSIS — C787 Secondary malignant neoplasm of liver and intrahepatic bile duct: Secondary | ICD-10-CM

## 2018-09-18 DIAGNOSIS — C182 Malignant neoplasm of ascending colon: Secondary | ICD-10-CM | POA: Diagnosis not present

## 2018-09-18 DIAGNOSIS — Z79899 Other long term (current) drug therapy: Secondary | ICD-10-CM

## 2018-09-18 DIAGNOSIS — Z5112 Encounter for antineoplastic immunotherapy: Secondary | ICD-10-CM

## 2018-09-18 DIAGNOSIS — C189 Malignant neoplasm of colon, unspecified: Secondary | ICD-10-CM

## 2018-09-18 DIAGNOSIS — E878 Other disorders of electrolyte and fluid balance, not elsewhere classified: Secondary | ICD-10-CM

## 2018-09-18 DIAGNOSIS — Z87891 Personal history of nicotine dependence: Secondary | ICD-10-CM

## 2018-09-18 DIAGNOSIS — E871 Hypo-osmolality and hyponatremia: Secondary | ICD-10-CM

## 2018-09-18 DIAGNOSIS — C785 Secondary malignant neoplasm of large intestine and rectum: Secondary | ICD-10-CM

## 2018-09-18 DIAGNOSIS — R911 Solitary pulmonary nodule: Secondary | ICD-10-CM

## 2018-09-18 DIAGNOSIS — Z9049 Acquired absence of other specified parts of digestive tract: Secondary | ICD-10-CM

## 2018-09-18 DIAGNOSIS — R5383 Other fatigue: Secondary | ICD-10-CM

## 2018-09-18 DIAGNOSIS — C786 Secondary malignant neoplasm of retroperitoneum and peritoneum: Secondary | ICD-10-CM | POA: Diagnosis not present

## 2018-09-18 DIAGNOSIS — Z5111 Encounter for antineoplastic chemotherapy: Secondary | ICD-10-CM

## 2018-09-18 LAB — COMPREHENSIVE METABOLIC PANEL
ALK PHOS: 84 U/L (ref 38–126)
ALT: 23 U/L (ref 0–44)
AST: 33 U/L (ref 15–41)
Albumin: 3.7 g/dL (ref 3.5–5.0)
Anion gap: 8 (ref 5–15)
BUN: 10 mg/dL (ref 8–23)
CALCIUM: 9.3 mg/dL (ref 8.9–10.3)
CHLORIDE: 108 mmol/L (ref 98–111)
CO2: 23 mmol/L (ref 22–32)
CREATININE: 0.78 mg/dL (ref 0.44–1.00)
GFR calc Af Amer: >60 mL/min (ref >60)
GFR calc non Af Amer: >60 mL/min (ref >60)
Glucose, Bld: 167 mg/dL — ABNORMAL HIGH (ref 70–99)
Potassium: 3.7 mmol/L (ref 3.5–5.1)
SODIUM: 139 mmol/L (ref 135–145)
Total Bilirubin: 0.6 mg/dL (ref 0.3–1.2)
Total Protein: 6.5 g/dL (ref 6.5–8.1)

## 2018-09-18 LAB — CBC WITH DIFFERENTIAL/PLATELET
Abs Immature Granulocytes: 0.02 10*3/uL (ref 0.00–0.07)
Basophils Absolute: 0.1 10*3/uL (ref 0.0–0.1)
Basophils Relative: 2 %
EOS PCT: 1 %
Eosinophils Absolute: 0.1 10*3/uL (ref 0.0–0.5)
HEMATOCRIT: 36.4 % (ref 36.0–46.0)
Hemoglobin: 12 g/dL (ref 12.0–15.0)
Immature Granulocytes: 0 %
LYMPHS ABS: 1 10*3/uL (ref 0.7–4.0)
LYMPHS PCT: 18 %
MCH: 34.5 pg — AB (ref 26.0–34.0)
MCHC: 33 g/dL (ref 30.0–36.0)
MCV: 104.6 fL — AB (ref 80.0–100.0)
MONO ABS: 0.5 10*3/uL (ref 0.1–1.0)
MONOS PCT: 10 %
Neutro Abs: 3.7 10*3/uL (ref 1.7–7.7)
Neutrophils Relative %: 69 %
PLATELETS: 136 10*3/uL — AB (ref 150–400)
RBC: 3.48 MIL/uL — ABNORMAL LOW (ref 3.87–5.11)
RDW: 15 % (ref 11.5–15.5)
WBC: 5.4 10*3/uL (ref 4.0–10.5)
nRBC: 0 % (ref 0.0–0.2)

## 2018-09-18 LAB — PROTEIN, URINE, RANDOM: TOTAL PROTEIN, URINE: NEGATIVE mg/dL

## 2018-09-18 MED ORDER — ATROPINE SULFATE 1 MG/ML IJ SOLN
0.5000 mg | Freq: Once | INTRAMUSCULAR | Status: AC
  Administered 2018-09-18: 0.5 mg via INTRAVENOUS
  Filled 2018-09-18: qty 1

## 2018-09-18 MED ORDER — SODIUM CHLORIDE 0.9 % IV SOLN
2400.0000 mg/m2 | INTRAVENOUS | Status: DC
  Administered 2018-09-18: 4800 mg via INTRAVENOUS
  Filled 2018-09-18: qty 96

## 2018-09-18 MED ORDER — PALONOSETRON HCL INJECTION 0.25 MG/5ML
0.2500 mg | Freq: Once | INTRAVENOUS | Status: AC
  Administered 2018-09-18: 0.25 mg via INTRAVENOUS
  Filled 2018-09-18: qty 5

## 2018-09-18 MED ORDER — LEUCOVORIN CALCIUM INJECTION 350 MG
800.0000 mg | Freq: Once | INTRAVENOUS | Status: AC
  Administered 2018-09-18: 800 mg via INTRAVENOUS
  Filled 2018-09-18: qty 35

## 2018-09-18 MED ORDER — FLUOROURACIL CHEMO INJECTION 2.5 GM/50ML
400.0000 mg/m2 | Freq: Once | INTRAVENOUS | Status: AC
  Administered 2018-09-18: 800 mg via INTRAVENOUS
  Filled 2018-09-18: qty 16

## 2018-09-18 MED ORDER — DEXTROSE 5 % IV SOLN
Freq: Once | INTRAVENOUS | Status: DC
  Filled 2018-09-18: qty 250

## 2018-09-18 MED ORDER — SODIUM CHLORIDE 0.9 % IV SOLN
800.0000 mg | Freq: Once | INTRAVENOUS | Status: AC
  Administered 2018-09-18: 800 mg via INTRAVENOUS
  Filled 2018-09-18: qty 32

## 2018-09-18 MED ORDER — SODIUM CHLORIDE 0.9% FLUSH
10.0000 mL | INTRAVENOUS | Status: DC | PRN
  Administered 2018-09-18: 10 mL
  Filled 2018-09-18: qty 10

## 2018-09-18 MED ORDER — SODIUM CHLORIDE 0.9 % IV SOLN
Freq: Once | INTRAVENOUS | Status: AC
  Administered 2018-09-18: 10:00:00 via INTRAVENOUS
  Filled 2018-09-18: qty 250

## 2018-09-18 MED ORDER — IRINOTECAN HCL CHEMO INJECTION 100 MG/5ML
125.0000 mg/m2 | Freq: Once | INTRAVENOUS | Status: AC
  Administered 2018-09-18: 240 mg via INTRAVENOUS
  Filled 2018-09-18: qty 10

## 2018-09-18 MED ORDER — DEXAMETHASONE SODIUM PHOSPHATE 10 MG/ML IJ SOLN
10.0000 mg | Freq: Once | INTRAMUSCULAR | Status: AC
  Administered 2018-09-18: 10 mg via INTRAVENOUS
  Filled 2018-09-18: qty 1

## 2018-09-18 NOTE — Progress Notes (Signed)
Patient was more confused by end of chemotherapy today. Didn't follow simple requests such as stand up, or walk this way. Needed continuous guidance to get up and ambulate out of facility with her caregiver from [facility] care home. Vitals remained stable throughout treatment today.

## 2018-09-18 NOTE — Progress Notes (Signed)
Hematology/Oncology Consult note [facility]  Telephone:([phone] [phone]  Patient Care Team: Lin Givens, MD as PCP - General (Family Medicine) Clent Jacks, RN as Registered Nurse   Name of the patient: Latoya Jones  [id number]  02-12-1946   Date of visit: 09/18/18  Diagnosis- adenocarcinoma of the colon at least stage IIIC pT4b pN2aMxstatus post hemicolectomy.Subsequently patient found to have a peritoneal nodule as well as liver metastases and is currently being treated as stage IV colon cancer   Chief complaint/ Reason for visit-on treatment assessment prior to cycle 3 of palliative FOLFIRI Avastin chemotherapy  Heme/Onc history: Patient is a 72 year old female with a h/o developmental and mood disoredr who resides at a group home. She presented to the ER with symptoms of dizziness mostly when she stands up. She has had problems with constipation in the past and takes prune juice to relieve it. Patient is a poor historian at baseline and complained of abdominal pain in ER which led to CT abdomen. 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  CT chest without contrast showed tiny 2 mm right upper lobe pulmonary nodule. No other evidence of metastatic disease. Baseline CEA elevated at 4.8  Patient underwent exploratory laparotomy with right hemicolectomy and terminal ileum resection as well as separate small bowel resection on 12/06/2017. Operative findings showed a large cecal tumor with extramural spread to retroperitoneum mesentery, mesentery lymph nodes and parietal peritoneum. Dense inflammatory response of the retroperitoneum and small bowel.There was a second portion of small bowel that was involved and was resected. A small portion of free tumor was identified in the retroperitoneum which was sent off for examination as well.  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 Radial margin positive for invasive carcinoma 2 colon adenomas 2.5 and 1.8 cm of the ascending colon. Metastatic adenocarcinoma in 4 out of 13 lymph nodes. 2 tumor deposits Segment of small intestine with abscess which also showed adenocarcinoma invading the distal appendix with perforation. One mesenteric lymph node negative for malignancy. Retroperitoneal tumor removal was also positive for mucinous adenocarcinoma. Tumor was grade 2. Proximal and distal margins were negative.pT4bpN2a. MSI stable. KRAS mutation positive  Case discussed at tumor board. PET CT will not be done at this time as it may represent post surgical changes. Radiation oncology does not recommend adjuvant RT upon completion of chemotherapy.she was found to have recurrent small bowel soft tissue mass and peritoneal nodularity after 5 cycles of folfox. FOLFOX avastin to be given with a palliative intent. oxaliplatin discontinued after 5 cycles due to progressive thrombocytopenia requiring treatment interruptions  Scans in sept showed stable primary mass but increase in size of liver lesions. FOLFIRI avastin started on 07/31/18  Interval history-she is doing well.  Her appetite is good and she has not had any nausea vomiting or diarrhea.  Her weight has remained pressure on most occasions has been less than 140 and she continues to hold amlodipine during such times.  Caregiver has noticed to pea-sized nodules one on her scalp and the back of her neck which we do not know how long she has had it since it is now visible after she has lost her hair  ECOG PS- 1 Pain scale- 0 Opioid associated constipation- no  Review of systems- Review of Systems  Constitutional: Positive for malaise/fatigue. Negative for chills, fever and weight loss.  HENT: Negative for congestion, ear discharge and nosebleeds.  Eyes: Negative for blurred vision.  Respiratory: Negative for cough, hemoptysis, sputum production, shortness  of breath and wheezing.   Cardiovascular: Negative for chest pain, palpitations, orthopnea and claudication.  Gastrointestinal: Negative for abdominal pain, blood in stool, constipation, diarrhea, heartburn, melena, nausea and vomiting.  Genitourinary: Negative for dysuria, flank pain, frequency, hematuria and urgency.  Musculoskeletal: Negative for back pain, joint pain and myalgias.  Skin: Negative for rash.  Neurological: Negative for dizziness, tingling, focal weakness, seizures, weakness and headaches.  Endo/Heme/Allergies: Does not bruise/bleed easily.  Psychiatric/Behavioral: Negative for depression and suicidal ideas. The patient does not have insomnia.       No Known Allergies   Past Medical History:  Diagnosis Date  . Cancer (HCC)   . Colon cancer (HCC)      Past Surgical History:  Procedure Laterality Date  . COLOSTOMY REVISION N/A 12/06/2017   Procedure: COLON RESECTION RIGHT;  Surgeon: Cooper, Richard E, MD;  Location: [facility] ORS;  Service: General;  Laterality: N/A;  . DILATION AND CURETTAGE, DIAGNOSTIC / THERAPEUTIC    . PORTACATH PLACEMENT N/A 01/06/2018   Procedure: INSERTION PORT-A-CATH;  Surgeon: Davis, Jason Evan, MD;  Location: [facility] ORS;  Service: General;  Laterality: N/A;    Social History   Socioeconomic History  . Marital status: Widowed    Spouse name: Not on file  . Number of children: Not on file  . Years of education: Not on file  . Highest education level: Not on file  Occupational History  . Not on file  Social Needs  . Financial resource strain: Patient refused  . Food insecurity:    Worry: Patient refused    Inability: Patient refused  . Transportation needs:    Medical: Patient refused    Non-medical: Patient refused  Tobacco Use  . Smoking status: Former Smoker  . Smokeless tobacco: Never Used  Substance and Sexual Activity  . Alcohol use: No  . Drug use: No  . Sexual activity: Never  Lifestyle  . Physical activity:    Days per  week: Patient refused    Minutes per session: Patient refused  . Stress: Patient refused  Relationships  . Social connections:    Talks on phone: Patient refused    Gets together: Patient refused    Attends religious service: Patient refused    Active member of club or organization: Patient refused    Attends meetings of clubs or organizations: Patient refused    Relationship status: Patient refused  . Intimate partner violence:    Fear of current or ex partner: Patient refused    Emotionally abused: Patient refused    Physically abused: Patient refused    Forced sexual activity: Patient refused  Other Topics Concern  . Not on file  Social History Narrative  . Not on file    Family History  Problem Relation Age of Onset  . ALS Mother   . ALS Father      Current Outpatient Medications:  .  acetaminophen (TYLENOL) 500 MG tablet, Take 1,000 mg by mouth every 8 (eight) hours as needed for mild pain., Disp: , Rfl:  .  alendronate (FOSAMAX) 70 MG tablet, Take 70 mg by mouth once a week., Disp: , Rfl:  .  amLODipine (NORVASC) 10 MG tablet, Take 10 mg by mouth daily. , Disp: , Rfl:  .  ARIPiprazole (ABILIFY) 5 MG tablet, Take 7.5 mg by mouth at bedtime. , Disp: , Rfl:  .  benztropine (COGENTIN) 0.5 MG tablet, Take 0.5   mg by mouth at bedtime., Disp: , Rfl:  .  Calcium 600-200 MG-UNIT tablet, Take 1 tablet by mouth daily., Disp: , Rfl:  .  calcium carbonate (TUMS - DOSED IN MG ELEMENTAL CALCIUM) 500 MG chewable tablet, Chew 1 tablet by mouth 2 (two) times daily as needed for indigestion or heartburn., Disp: , Rfl:  .  lidocaine-prilocaine (EMLA) cream, Apply 1 application topically as needed. 1 hour prior to  each chemotherapy treatment. Place small amount over port site and place saran wrap over the cream to protect clothing, Disp: 30 g, Rfl: 0 .  loratadine (CLARITIN) 10 MG tablet, Take 1 tablet (10 mg total) by mouth daily as needed for allergies. Take 1 tablet the day before pump  removal, 1 tablet the day of pump removal and 1 tablet the day after pump removal. Take as directed with each cycle of  chemotherapy treatment, Disp: 30 tablet, Rfl: 1 .  memantine (NAMENDA XR) 14 MG CP24 24 hr capsule, Take 14 mg by mouth at bedtime. , Disp: , Rfl:  .  metoprolol succinate (TOPROL-XL) 50 MG 24 hr tablet, Take 50 mg by mouth every morning. Take with or immediately following a meal. , Disp: , Rfl:  .  Multiple Vitamin (MULTIVITAMIN WITH MINERALS) TABS tablet, Take 1 tablet by mouth daily., Disp: 30 tablet, Rfl: 0 .  ondansetron (ZOFRAN) 8 MG tablet, Take 1 tablet (8 mg total) by mouth 2 (two) times daily as needed for nausea or vomiting. Can start using on day 3 after chemo, Disp: 20 tablet, Rfl: 0 .  perphenazine (TRILAFON) 4 MG tablet, Take 4 mg by mouth 2 (two) times daily., Disp: , Rfl:  .  prochlorperazine (COMPAZINE) 10 MG tablet, Take 1 tablet (10 mg total) by mouth every 6 (six) hours as needed for nausea or vomiting., Disp: 30 tablet, Rfl: 0 No current facility-administered medications for this visit.   Facility-Administered Medications Ordered in Other Visits:  .  dextrose 5 % solution, , Intravenous, Once, ,  C, MD .  heparin lock flush 100 unit/mL, 500 Units, Intravenous, Once, ,  C, MD .  heparin lock flush 100 unit/mL, 500 Units, Intracatheter, Once PRN, ,  C, MD .  heparin lock flush 100 unit/mL, 500 Units, Intravenous, Once, ,  C, MD .  sodium chloride flush (NS) 0.9 % injection 10 mL, 10 mL, Intravenous, PRN, ,  C, MD, 10 mL at 02/06/18 0845 .  sodium chloride flush (NS) 0.9 % injection 10 mL, 10 mL, Intracatheter, PRN, ,  C, MD .  sodium chloride flush (NS) 0.9 % injection 10 mL, 10 mL, Intracatheter, PRN, ,  C, MD, 10 mL at 03/06/18 0900 .  sodium chloride flush (NS) 0.9 % injection 10 mL, 10 mL, Intravenous, PRN, ,  C, MD, 10 mL at 03/27/18 0909  Physical exam:  Vitals:   09/18/18  0927  Weight: 177 lb 11.1 oz (80.6 kg)   Physical Exam  Constitutional: She is oriented to person, place, and time. She appears well-developed and well-nourished.  HENT:  Head: Normocephalic and atraumatic.  Eyes: Pupils are equal, round, and reactive to light. EOM are normal.  Neck: Normal range of motion.  Cardiovascular: Normal rate, regular rhythm and normal heart sounds.  Pulmonary/Chest: Effort normal and breath sounds normal.  Abdominal: Soft. Bowel sounds are normal.  Neurological: She is alert and oriented to person, place, and time.  Skin: Skin is warm and dry.  To subcutaneous pea-sized nodules palpable in the midline   at the nape of the neck as well as over the scalp     CMP Latest Ref Rng & Units 09/18/2018  Glucose 70 - 99 mg/dL 167(H)  BUN 8 - 23 mg/dL 10  Creatinine 0.44 - 1.00 mg/dL 0.78  Sodium 135 - 145 mmol/L 139  Potassium 3.5 - 5.1 mmol/L 3.7  Chloride 98 - 111 mmol/L 108  CO2 22 - 32 mmol/L 23  Calcium 8.9 - 10.3 mg/dL 9.3  Total Protein 6.5 - 8.1 g/dL 6.5  Total Bilirubin 0.3 - 1.2 mg/dL 0.6  Alkaline Phos 38 - 126 U/L 84  AST 15 - 41 U/L 33  ALT 0 - 44 U/L 23   CBC Latest Ref Rng & Units 09/18/2018  WBC 4.0 - 10.5 K/uL 5.4  Hemoglobin 12.0 - 15.0 g/dL 12.0  Hematocrit 36.0 - 46.0 % 36.4  Platelets 150 - 400 K/uL 136(L)      Assessment and plan- Patient is a 71 y.o. female with stage IV: Adenocarcinoma with peritoneal metastases and possible liver metastases.  She is here for on treatment assessment prior to cycle 3 of FOLFIRI Avastin  Counts okay to proceed with cycle 3 of FOLFIRI Avastin chemotherapy today.  Her last treatment prior to this was 4 weeks ago as her caregiver could not bring her at the 3-week mark.  We are unable to do chemotherapy every 2 weeks due to her thrombocytopenia.  She does have mild thrombocytopenia today but significantly improved.  Also her anemia has improved.  Her blood pressure is stable and she is unable to give us a  urine for protein as she always has some bowel movement each time she tries to give a urine sample.  I will see her back in 3 weeks time for cycle 4 of FOLFIRI Avastin chemotherapy with CBC and CMP.  CEA from today is pending.  I will plan to get repeat scans in December 2019.  She does have 2 small pea-sized nodules in her scalp and nape of the neck the etiology of which is unclear.  Given her underlying cognitive issues as well as poor tolerance to higher doses of chemotherapy, I will hold off on a biopsy of these nodules at this time and continue to monitor if new nodules show up in the future   Visit Diagnosis 1. Encounter for antineoplastic chemotherapy   2. Encounter for monoclonal antibody treatment for malignancy   3. Colon adenocarcinoma (HCC)      Dr.  , MD, MPH [facility] [phone] 09/18/2018 9:28 AM                

## 2018-09-18 NOTE — Progress Notes (Signed)
Patient has been receiving Avastin 10mg /kg at 900mg . Patients weight has decreased to 80kg, therefore new calculated dose is 800mg . Spoke with MD, ok to reduce dose to 800mg 

## 2018-09-18 NOTE — Progress Notes (Signed)
Pt has knots on her head and the caregiver unsure if it is new or not because she had hair and not she does not and it can be seen now. Feels like she is eating ok-like her usual., no diarrhea per caregiver

## 2018-09-19 LAB — CEA: CEA1: 5.9 ng/mL — AB (ref 0.0–4.7)

## 2018-09-20 ENCOUNTER — Inpatient Hospital Stay: Payer: Medicare Other

## 2018-09-20 VITALS — BP 114/71 | HR 62 | Temp 96.5°F | Resp 18

## 2018-09-20 DIAGNOSIS — Z5111 Encounter for antineoplastic chemotherapy: Secondary | ICD-10-CM | POA: Diagnosis not present

## 2018-09-20 DIAGNOSIS — C189 Malignant neoplasm of colon, unspecified: Secondary | ICD-10-CM

## 2018-09-20 MED ORDER — HEPARIN SOD (PORK) LOCK FLUSH 100 UNIT/ML IV SOLN
500.0000 [IU] | Freq: Once | INTRAVENOUS | Status: AC
  Administered 2018-09-20: 500 [IU] via INTRAVENOUS

## 2018-09-20 MED ORDER — SODIUM CHLORIDE 0.9% FLUSH
10.0000 mL | INTRAVENOUS | Status: DC | PRN
  Administered 2018-09-20: 10 mL via INTRAVENOUS
  Filled 2018-09-20: qty 10

## 2018-09-20 NOTE — Progress Notes (Signed)
Here for pump removal. Patient able to follow commands. Patient has better cognitive function today. Denies any diarrhea.

## 2018-10-02 ENCOUNTER — Inpatient Hospital Stay: Payer: Medicare Other

## 2018-10-02 ENCOUNTER — Encounter: Payer: Self-pay | Admitting: Oncology

## 2018-10-02 ENCOUNTER — Inpatient Hospital Stay: Payer: Medicare Other | Attending: Oncology | Admitting: Oncology

## 2018-10-02 VITALS — BP 157/81 | HR 55 | Temp 97.3°F | Resp 18 | Ht 65.0 in | Wt 178.4 lb

## 2018-10-02 DIAGNOSIS — C787 Secondary malignant neoplasm of liver and intrahepatic bile duct: Secondary | ICD-10-CM | POA: Diagnosis not present

## 2018-10-02 DIAGNOSIS — D6959 Other secondary thrombocytopenia: Secondary | ICD-10-CM

## 2018-10-02 DIAGNOSIS — C785 Secondary malignant neoplasm of large intestine and rectum: Secondary | ICD-10-CM

## 2018-10-02 DIAGNOSIS — D6481 Anemia due to antineoplastic chemotherapy: Secondary | ICD-10-CM

## 2018-10-02 DIAGNOSIS — Z87891 Personal history of nicotine dependence: Secondary | ICD-10-CM | POA: Insufficient documentation

## 2018-10-02 DIAGNOSIS — D696 Thrombocytopenia, unspecified: Secondary | ICD-10-CM

## 2018-10-02 DIAGNOSIS — C182 Malignant neoplasm of ascending colon: Secondary | ICD-10-CM | POA: Diagnosis not present

## 2018-10-02 DIAGNOSIS — R5383 Other fatigue: Secondary | ICD-10-CM | POA: Insufficient documentation

## 2018-10-02 DIAGNOSIS — E878 Other disorders of electrolyte and fluid balance, not elsewhere classified: Secondary | ICD-10-CM | POA: Diagnosis not present

## 2018-10-02 DIAGNOSIS — Z79899 Other long term (current) drug therapy: Secondary | ICD-10-CM | POA: Insufficient documentation

## 2018-10-02 DIAGNOSIS — T451X5A Adverse effect of antineoplastic and immunosuppressive drugs, initial encounter: Secondary | ICD-10-CM | POA: Insufficient documentation

## 2018-10-02 DIAGNOSIS — Z23 Encounter for immunization: Secondary | ICD-10-CM | POA: Diagnosis not present

## 2018-10-02 DIAGNOSIS — R911 Solitary pulmonary nodule: Secondary | ICD-10-CM | POA: Insufficient documentation

## 2018-10-02 DIAGNOSIS — D701 Agranulocytosis secondary to cancer chemotherapy: Secondary | ICD-10-CM | POA: Insufficient documentation

## 2018-10-02 DIAGNOSIS — E871 Hypo-osmolality and hyponatremia: Secondary | ICD-10-CM | POA: Insufficient documentation

## 2018-10-02 DIAGNOSIS — C786 Secondary malignant neoplasm of retroperitoneum and peritoneum: Secondary | ICD-10-CM | POA: Diagnosis not present

## 2018-10-02 DIAGNOSIS — Z5111 Encounter for antineoplastic chemotherapy: Secondary | ICD-10-CM | POA: Insufficient documentation

## 2018-10-02 DIAGNOSIS — C189 Malignant neoplasm of colon, unspecified: Secondary | ICD-10-CM

## 2018-10-02 DIAGNOSIS — Z9049 Acquired absence of other specified parts of digestive tract: Secondary | ICD-10-CM | POA: Diagnosis not present

## 2018-10-02 LAB — CBC WITH DIFFERENTIAL/PLATELET
ABS IMMATURE GRANULOCYTES: 0 10*3/uL (ref 0.00–0.07)
Basophils Absolute: 0 10*3/uL (ref 0.0–0.1)
Basophils Relative: 0 %
Eosinophils Absolute: 0.1 10*3/uL (ref 0.0–0.5)
Eosinophils Relative: 4 %
HEMATOCRIT: 33.3 % — AB (ref 36.0–46.0)
HEMOGLOBIN: 11 g/dL — AB (ref 12.0–15.0)
IMMATURE GRANULOCYTES: 0 %
LYMPHS ABS: 1.3 10*3/uL (ref 0.7–4.0)
LYMPHS PCT: 58 %
MCH: 33.7 pg (ref 26.0–34.0)
MCHC: 33 g/dL (ref 30.0–36.0)
MCV: 102.1 fL — AB (ref 80.0–100.0)
MONO ABS: 0.3 10*3/uL (ref 0.1–1.0)
MONOS PCT: 11 %
NEUTROS ABS: 0.6 10*3/uL — AB (ref 1.7–7.7)
NEUTROS PCT: 27 %
Platelets: 132 10*3/uL — ABNORMAL LOW (ref 150–400)
RBC: 3.26 MIL/uL — ABNORMAL LOW (ref 3.87–5.11)
RDW: 14.4 % (ref 11.5–15.5)
WBC: 2.3 10*3/uL — ABNORMAL LOW (ref 4.0–10.5)
nRBC: 0 % (ref 0.0–0.2)

## 2018-10-02 LAB — COMPREHENSIVE METABOLIC PANEL
ALBUMIN: 3.7 g/dL (ref 3.5–5.0)
ALT: 18 U/L (ref 0–44)
AST: 29 U/L (ref 15–41)
Alkaline Phosphatase: 73 U/L (ref 38–126)
Anion gap: 9 (ref 5–15)
BUN: 8 mg/dL (ref 8–23)
CHLORIDE: 104 mmol/L (ref 98–111)
CO2: 22 mmol/L (ref 22–32)
Calcium: 8.8 mg/dL — ABNORMAL LOW (ref 8.9–10.3)
Creatinine, Ser: 0.74 mg/dL (ref 0.44–1.00)
GFR calc Af Amer: >60 mL/min (ref >60)
GFR calc non Af Amer: >60 mL/min (ref >60)
GLUCOSE: 121 mg/dL — AB (ref 70–99)
POTASSIUM: 4 mmol/L (ref 3.5–5.1)
SODIUM: 135 mmol/L (ref 135–145)
Total Bilirubin: 0.7 mg/dL (ref 0.3–1.2)
Total Protein: 6.3 g/dL — ABNORMAL LOW (ref 6.5–8.1)

## 2018-10-02 MED ORDER — HEPARIN SOD (PORK) LOCK FLUSH 100 UNIT/ML IV SOLN
500.0000 [IU] | Freq: Once | INTRAVENOUS | Status: AC
  Administered 2018-10-02: 500 [IU] via INTRAVENOUS

## 2018-10-02 MED ORDER — SODIUM CHLORIDE 0.9% FLUSH
10.0000 mL | INTRAVENOUS | Status: DC | PRN
  Administered 2018-10-02: 10 mL via INTRAVENOUS
  Filled 2018-10-02: qty 10

## 2018-10-02 NOTE — Addendum Note (Signed)
Addended by: Luella Cook on: 10/02/2018 11:43 AM   Modules accepted: Orders

## 2018-10-02 NOTE — Progress Notes (Signed)
Pt needs flu shot and wants to know when she can get it

## 2018-10-02 NOTE — Addendum Note (Signed)
Addended by: Luella Cook on: 10/02/2018 10:06 AM   Modules accepted: Orders

## 2018-10-02 NOTE — Progress Notes (Signed)
Hematology/Oncology Consult note [facility]  Telephone:([phone] [phone]  Patient Care Team: Lin Givens, MD as PCP - General (Family Medicine) Clent Jacks, RN as Registered Nurse   Name of the patient: Latoya Jones  [id number]  [birth year]   Date of visit: 10/02/18  Diagnosis- adenocarcinoma of the colon at least stage IIIC pT4b pN2aMxstatus post hemicolectomy.Subsequently patient found to have a peritoneal nodule as well as liver metastases and is currently being treated as stage IV colon cancer   Chief complaint/ Reason for visit-on treatment assessment prior to cycle 4 of palliative FOLFIRI Avastin chemotherapy  Heme/Onc history: Patient is a 72 year old female with a h/o developmental and mood disoredr who resides at a group home. She presented to the ER with symptoms of dizziness mostly when she stands up. She has had problems with constipation in the past and takes prune juice to relieve it. Patient is a poor historian at baseline and complained of abdominal pain in ER which led to CT abdomen. 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  CT chest without contrast showed tiny 2 mm right upper lobe pulmonary nodule. No other evidence of metastatic disease. Baseline CEA elevated at 4.8  Patient underwent exploratory laparotomy with right hemicolectomy and terminal ileum resection as well as separate small bowel resection on 12/06/2017. Operative findings showed a large cecal tumor with extramural spread to retroperitoneum mesentery, mesentery lymph nodes and parietal peritoneum. Dense inflammatory response of the retroperitoneum and small bowel.There was a second portion of small bowel that was involved and was resected. A small portion of free tumor was identified in the retroperitoneum which was sent off for examination as well.  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 Radial margin positive for invasive carcinoma 2 colon adenomas 2.5 and 1.8 cm of the ascending colon. Metastatic adenocarcinoma in 4 out of 13 lymph nodes. 2 tumor deposits Segment of small intestine with abscess which also showed adenocarcinoma invading the distal appendix with perforation. One mesenteric lymph node negative for malignancy. Retroperitoneal tumor removal was also positive for mucinous adenocarcinoma. Tumor was grade 2. Proximal and distal margins were negative.pT4bpN2a. MSI stable. KRAS mutation positive  Case discussed at tumor board. PET CT will not be done at this time as it may represent post surgical changes. Radiation oncology does not recommend adjuvant RT upon completion of chemotherapy.she was found to have recurrent small bowel soft tissue mass and peritoneal nodularity after 5 cycles of folfox. FOLFOX avastin to be given with a palliative intent. oxaliplatin discontinued after 5 cycles due to progressive thrombocytopenia requiring treatment interruptions  Scans in sept showed stable primary mass but increase in size of liver lesions. FOLFIRI avastin started on 07/31/18   Interval history-caregiver reports that she is doing well at her nursing home.  Denies any falls.  Her appetite is stable and she has not had any significant weight loss.  No diarrhea.  She had one episode of constipation which resolved with prune juice  ECOG PS- 2 Pain scale- 0 Opioid associated constipation- no  Review of systems- Review of Systems  Constitutional: Positive for malaise/fatigue. Negative for chills, fever and weight loss.  HENT: Negative for congestion, ear discharge and nosebleeds.   Eyes: Negative for blurred vision.  Respiratory: Negative for cough, hemoptysis, sputum production, shortness of breath and wheezing.   Cardiovascular: Negative for chest pain, palpitations, orthopnea and claudication.  Gastrointestinal: Negative for abdominal pain, blood  in  stool, constipation, diarrhea, heartburn, melena, nausea and vomiting.  Genitourinary: Negative for dysuria, flank pain, frequency, hematuria and urgency.  Musculoskeletal: Negative for back pain, joint pain and myalgias.  Skin: Negative for rash.  Neurological: Negative for dizziness, tingling, focal weakness, seizures, weakness and headaches.  Endo/Heme/Allergies: Does not bruise/bleed easily.  Psychiatric/Behavioral: Negative for depression and suicidal ideas. The patient does not have insomnia.       No Known Allergies   Past Medical History:  Diagnosis Date  . Colon cancer ([place])   . Hypertension      Past Surgical History:  Procedure Laterality Date  . COLOSTOMY REVISION N/A 12/06/2017   Procedure: COLON RESECTION RIGHT;  Surgeon: Florene Glen, MD;  Location: [facility] ORS;  Service: General;  Laterality: N/A;  . DILATION AND CURETTAGE, DIAGNOSTIC / THERAPEUTIC    . PORTACATH PLACEMENT N/A 01/06/2018   Procedure: INSERTION PORT-A-CATH;  Surgeon: Vickie Epley, MD;  Location: [facility] ORS;  Service: General;  Laterality: N/A;    Social History   Socioeconomic History  . Marital status: Widowed    Spouse name: Not on file  . Number of children: Not on file  . Years of education: Not on file  . Highest education level: Not on file  Occupational History  . Not on file  Social Needs  . Financial resource strain: Patient refused  . Food insecurity:    Worry: Patient refused    Inability: Patient refused  . Transportation needs:    Medical: Patient refused    Non-medical: Patient refused  Tobacco Use  . Smoking status: Former Smoker    Last attempt to quit: 11/23/2007    Years since quitting: 10.8  . Smokeless tobacco: Never Used  Substance and Sexual Activity  . Alcohol use: No  . Drug use: No  . Sexual activity: Never  Lifestyle  . Physical activity:    Days per week: Patient refused    Minutes per session: Patient refused  . Stress: Patient refused    Relationships  . Social connections:    Talks on phone: Patient refused    Gets together: Patient refused    Attends religious service: Patient refused    Active member of club or organization: Patient refused    Attends meetings of clubs or organizations: Patient refused    Relationship status: Patient refused  . Intimate partner violence:    Fear of current or ex partner: Patient refused    Emotionally abused: Patient refused    Physically abused: Patient refused    Forced sexual activity: Patient refused  Other Topics Concern  . Not on file  Social History Narrative  . Not on file    Family History  Problem Relation Age of Onset  . ALS Mother   . ALS Father      Current Outpatient Medications:  .  acetaminophen (TYLENOL) 500 MG tablet, Take 1,000 mg by mouth every 8 (eight) hours as needed for mild pain., Disp: , Rfl:  .  alendronate (FOSAMAX) 70 MG tablet, Take 70 mg by mouth once a week., Disp: , Rfl:  .  amLODipine (NORVASC) 10 MG tablet, Take 10 mg by mouth daily. , Disp: , Rfl:  .  ARIPiprazole (ABILIFY) 5 MG tablet, Take 7.5 mg by mouth at bedtime. , Disp: , Rfl:  .  benztropine (COGENTIN) 0.5 MG tablet, Take 0.5 mg by mouth at bedtime., Disp: , Rfl:  .  Calcium 600-200 MG-UNIT tablet, Take 1 tablet by mouth daily.,  Disp: , Rfl:  .  calcium carbonate (TUMS - DOSED IN MG ELEMENTAL CALCIUM) 500 MG chewable tablet, Chew 1 tablet by mouth 2 (two) times daily as needed for indigestion or heartburn., Disp: , Rfl:  .  lidocaine-prilocaine (EMLA) cream, Apply 1 application topically as needed. 1 hour prior to  each chemotherapy treatment. Place small amount over port site and place saran wrap over the cream to protect clothing, Disp: 30 g, Rfl: 0 .  loratadine (CLARITIN) 10 MG tablet, Take 1 tablet (10 mg total) by mouth daily as needed for allergies. Take 1 tablet the day before pump removal, 1 tablet the day of pump removal and 1 tablet the day after pump removal. Take as  directed with each cycle of  chemotherapy treatment, Disp: 30 tablet, Rfl: 1 .  memantine (NAMENDA XR) 14 MG CP24 24 hr capsule, Take 14 mg by mouth at bedtime. , Disp: , Rfl:  .  metoprolol succinate (TOPROL-XL) 50 MG 24 hr tablet, Take 50 mg by mouth every morning. Take with or immediately following a meal. , Disp: , Rfl:  .  Multiple Vitamin (MULTIVITAMIN WITH MINERALS) TABS tablet, Take 1 tablet by mouth daily., Disp: 30 tablet, Rfl: 0 .  ondansetron (ZOFRAN) 8 MG tablet, Take 1 tablet (8 mg total) by mouth 2 (two) times daily as needed for nausea or vomiting. Can start using on day 3 after chemo, Disp: 20 tablet, Rfl: 0 .  perphenazine (TRILAFON) 4 MG tablet, Take 4 mg by mouth 2 (two) times daily., Disp: , Rfl:  .  prochlorperazine (COMPAZINE) 10 MG tablet, Take 1 tablet (10 mg total) by mouth every 6 (six) hours as needed for nausea or vomiting., Disp: 30 tablet, Rfl: 0 No current facility-administered medications for this visit.   Facility-Administered Medications Ordered in Other Visits:  .  dextrose 5 % solution, , Intravenous, Once, Randa Evens C, MD .  heparin lock flush 100 unit/mL, 500 Units, Intravenous, Once, Sindy Guadeloupe, MD .  heparin lock flush 100 unit/mL, 500 Units, Intracatheter, Once PRN, Sindy Guadeloupe, MD .  heparin lock flush 100 unit/mL, 500 Units, Intravenous, Once, Sindy Guadeloupe, MD .  sodium chloride flush (NS) 0.9 % injection 10 mL, 10 mL, Intravenous, PRN, Sindy Guadeloupe, MD, 10 mL at 02/06/18 0845 .  sodium chloride flush (NS) 0.9 % injection 10 mL, 10 mL, Intracatheter, PRN, Sindy Guadeloupe, MD .  sodium chloride flush (NS) 0.9 % injection 10 mL, 10 mL, Intracatheter, PRN, Sindy Guadeloupe, MD, 10 mL at 03/06/18 0900 .  sodium chloride flush (NS) 0.9 % injection 10 mL, 10 mL, Intravenous, PRN, Sindy Guadeloupe, MD, 10 mL at 03/27/18 2706  Physical exam:  Vitals:   10/02/18 0909  BP: (!) 157/81  Pulse: (!) 55  Resp: 18  Temp: (!) 97.3 F (36.3 C)  TempSrc:  Oral  Weight: 178 lb 5.6 oz (80.9 kg)  Height: '5\' 5"'  (1.651 m)   Physical Exam  Constitutional: She is oriented to person, place, and time. She appears well-developed and well-nourished.  Appears fatigued  HENT:  Head: Normocephalic and atraumatic.  Eyes: Pupils are equal, round, and reactive to light. EOM are normal.  Neck: Normal range of motion.  Cardiovascular: Normal rate, regular rhythm and normal heart sounds.  Pulmonary/Chest: Effort normal and breath sounds normal.  Abdominal: Soft. Bowel sounds are normal.  Neurological: She is alert and oriented to person, place, and time.  Skin: Skin is warm and dry.  CMP Latest Ref Rng & Units 09/18/2018  Glucose 70 - 99 mg/dL 167(H)  BUN 8 - 23 mg/dL 10  Creatinine 0.44 - 1.00 mg/dL 0.78  Sodium 135 - 145 mmol/L 139  Potassium 3.5 - 5.1 mmol/L 3.7  Chloride 98 - 111 mmol/L 108  CO2 22 - 32 mmol/L 23  Calcium 8.9 - 10.3 mg/dL 9.3  Total Protein 6.5 - 8.1 g/dL 6.5  Total Bilirubin 0.3 - 1.2 mg/dL 0.6  Alkaline Phos 38 - 126 U/L 84  AST 15 - 41 U/L 33  ALT 0 - 44 U/L 23   CBC Latest Ref Rng & Units 09/18/2018  WBC 4.0 - 10.5 K/uL 5.4  Hemoglobin 12.0 - 15.0 g/dL 12.0  Hematocrit 36.0 - 46.0 % 36.4  Platelets 150 - 400 K/uL 136(L)    Assessment and plan- Patient is a 72 y.o. female  with stage IV: Adenocarcinoma with peritoneal metastases and possible liver metastases.  She is here for routine follow-up of colon cancer  She is 2 weeks out of her last treatment and her white count is low at 2.3 with an absolute neutrophil count of 0.6.  She will therefore not beginning chemotherapy today and come back next week with CBC and CMP for cycle 4 of FOLFIRI Avastin chemotherapy.  She has been getting treatment every 3 weeks instead of every 2 weeks to thrombocytopenia and neutropenia. I will plan to get repeat scans after 4 cycles  Chemo induced neutropenia: Neutropenic precautions reviewed.  Chemo to be held today  Mild chemo  induced thrombocytopenia: Continue to monitor   Visit Diagnosis 1. Chemotherapy induced neutropenia (HCC)   2. Malignant neoplasm of ascending colon ([place])   3. Chemotherapy-induced thrombocytopenia      Dr. Randa Evens, MD, MPH [facility] [phone] 10/02/2018 9:28 AM

## 2018-10-06 ENCOUNTER — Inpatient Hospital Stay: Payer: Medicare Other

## 2018-10-06 VITALS — BP 143/79 | HR 56 | Temp 97.7°F | Resp 18

## 2018-10-06 DIAGNOSIS — Z5111 Encounter for antineoplastic chemotherapy: Secondary | ICD-10-CM | POA: Diagnosis not present

## 2018-10-06 DIAGNOSIS — Z299 Encounter for prophylactic measures, unspecified: Secondary | ICD-10-CM

## 2018-10-06 MED ORDER — INFLUENZA VAC SPLIT HIGH-DOSE 0.5 ML IM SUSY
0.5000 mL | PREFILLED_SYRINGE | Freq: Once | INTRAMUSCULAR | Status: AC
  Administered 2018-10-06: 0.5 mL via INTRAMUSCULAR

## 2018-10-09 ENCOUNTER — Inpatient Hospital Stay: Payer: Medicare Other

## 2018-10-09 ENCOUNTER — Encounter: Payer: Self-pay | Admitting: Oncology

## 2018-10-09 ENCOUNTER — Inpatient Hospital Stay (HOSPITAL_BASED_OUTPATIENT_CLINIC_OR_DEPARTMENT_OTHER): Payer: Medicare Other | Admitting: Oncology

## 2018-10-09 VITALS — BP 145/76 | HR 59 | Temp 97.3°F | Resp 18 | Ht 65.0 in | Wt 176.8 lb

## 2018-10-09 DIAGNOSIS — C189 Malignant neoplasm of colon, unspecified: Secondary | ICD-10-CM

## 2018-10-09 DIAGNOSIS — C785 Secondary malignant neoplasm of large intestine and rectum: Secondary | ICD-10-CM | POA: Diagnosis not present

## 2018-10-09 DIAGNOSIS — C786 Secondary malignant neoplasm of retroperitoneum and peritoneum: Secondary | ICD-10-CM

## 2018-10-09 DIAGNOSIS — Z5111 Encounter for antineoplastic chemotherapy: Secondary | ICD-10-CM

## 2018-10-09 DIAGNOSIS — C182 Malignant neoplasm of ascending colon: Secondary | ICD-10-CM | POA: Diagnosis not present

## 2018-10-09 DIAGNOSIS — C787 Secondary malignant neoplasm of liver and intrahepatic bile duct: Secondary | ICD-10-CM | POA: Diagnosis not present

## 2018-10-09 DIAGNOSIS — Z5112 Encounter for antineoplastic immunotherapy: Secondary | ICD-10-CM

## 2018-10-09 DIAGNOSIS — R911 Solitary pulmonary nodule: Secondary | ICD-10-CM

## 2018-10-09 DIAGNOSIS — Z79899 Other long term (current) drug therapy: Secondary | ICD-10-CM

## 2018-10-09 LAB — COMPREHENSIVE METABOLIC PANEL
ALK PHOS: 83 U/L (ref 38–126)
ALT: 31 U/L (ref 0–44)
AST: 38 U/L (ref 15–41)
Albumin: 3.9 g/dL (ref 3.5–5.0)
Anion gap: 9 (ref 5–15)
BUN: 8 mg/dL (ref 8–23)
CALCIUM: 9.3 mg/dL (ref 8.9–10.3)
CHLORIDE: 104 mmol/L (ref 98–111)
CO2: 24 mmol/L (ref 22–32)
CREATININE: 0.82 mg/dL (ref 0.44–1.00)
GFR calc non Af Amer: >60 mL/min (ref >60)
Glucose, Bld: 119 mg/dL — ABNORMAL HIGH (ref 70–99)
Potassium: 3.9 mmol/L (ref 3.5–5.1)
SODIUM: 137 mmol/L (ref 135–145)
Total Bilirubin: 0.5 mg/dL (ref 0.3–1.2)
Total Protein: 6.7 g/dL (ref 6.5–8.1)

## 2018-10-09 LAB — CBC WITH DIFFERENTIAL/PLATELET
Abs Immature Granulocytes: 0.02 10*3/uL (ref 0.00–0.07)
Basophils Absolute: 0 10*3/uL (ref 0.0–0.1)
Basophils Relative: 1 %
EOS ABS: 0.1 10*3/uL (ref 0.0–0.5)
EOS PCT: 3 %
HEMATOCRIT: 36.5 % (ref 36.0–46.0)
HEMOGLOBIN: 12.1 g/dL (ref 12.0–15.0)
Immature Granulocytes: 1 %
LYMPHS ABS: 1.2 10*3/uL (ref 0.7–4.0)
LYMPHS PCT: 40 %
MCH: 33.8 pg (ref 26.0–34.0)
MCHC: 33.2 g/dL (ref 30.0–36.0)
MCV: 102 fL — AB (ref 80.0–100.0)
MONO ABS: 0.5 10*3/uL (ref 0.1–1.0)
MONOS PCT: 16 %
Neutro Abs: 1.1 10*3/uL — ABNORMAL LOW (ref 1.7–7.7)
Neutrophils Relative %: 39 %
Platelets: 165 10*3/uL (ref 150–400)
RBC: 3.58 MIL/uL — ABNORMAL LOW (ref 3.87–5.11)
RDW: 14.9 % (ref 11.5–15.5)
WBC: 2.9 10*3/uL — ABNORMAL LOW (ref 4.0–10.5)
nRBC: 0 % (ref 0.0–0.2)

## 2018-10-09 LAB — PROTEIN, URINE, RANDOM: TOTAL PROTEIN, URINE: NEGATIVE mg/dL

## 2018-10-09 MED ORDER — SODIUM CHLORIDE 0.9 % IV SOLN
800.0000 mg | Freq: Once | INTRAVENOUS | Status: AC
  Administered 2018-10-09: 800 mg via INTRAVENOUS
  Filled 2018-10-09: qty 32

## 2018-10-09 MED ORDER — HEPARIN SOD (PORK) LOCK FLUSH 100 UNIT/ML IV SOLN: 500.0000 [IU] | Freq: Once | INTRAVENOUS | Status: DC | PRN

## 2018-10-09 MED ORDER — IRINOTECAN HCL CHEMO INJECTION 100 MG/5ML
125.0000 mg/m2 | Freq: Once | INTRAVENOUS | Status: AC
  Administered 2018-10-09: 240 mg via INTRAVENOUS
  Filled 2018-10-09: qty 10

## 2018-10-09 MED ORDER — LEUCOVORIN CALCIUM INJECTION 350 MG
800.0000 mg | Freq: Once | INTRAVENOUS | Status: AC
  Administered 2018-10-09: 800 mg via INTRAVENOUS
  Filled 2018-10-09: qty 15

## 2018-10-09 MED ORDER — SODIUM CHLORIDE 0.9 % IV SOLN
Freq: Once | INTRAVENOUS | Status: AC
  Administered 2018-10-09: 10:00:00 via INTRAVENOUS
  Filled 2018-10-09: qty 250

## 2018-10-09 MED ORDER — PALONOSETRON HCL INJECTION 0.25 MG/5ML
0.2500 mg | Freq: Once | INTRAVENOUS | Status: AC
  Administered 2018-10-09: 0.25 mg via INTRAVENOUS
  Filled 2018-10-09: qty 5

## 2018-10-09 MED ORDER — DEXAMETHASONE SODIUM PHOSPHATE 10 MG/ML IJ SOLN
10.0000 mg | Freq: Once | INTRAMUSCULAR | Status: AC
  Administered 2018-10-09: 10 mg via INTRAVENOUS
  Filled 2018-10-09: qty 1

## 2018-10-09 MED ORDER — ATROPINE SULFATE 1 MG/ML IJ SOLN
0.5000 mg | Freq: Once | INTRAMUSCULAR | Status: AC
  Administered 2018-10-09: 0.5 mg via INTRAVENOUS
  Filled 2018-10-09: qty 1

## 2018-10-09 MED ORDER — FLUOROURACIL CHEMO INJECTION 2.5 GM/50ML
400.0000 mg/m2 | Freq: Once | INTRAVENOUS | Status: AC
  Administered 2018-10-09: 800 mg via INTRAVENOUS
  Filled 2018-10-09: qty 16

## 2018-10-09 MED ORDER — SODIUM CHLORIDE 0.9 % IV SOLN
2400.0000 mg/m2 | INTRAVENOUS | Status: DC
  Administered 2018-10-09: 4800 mg via INTRAVENOUS
  Filled 2018-10-09: qty 96

## 2018-10-09 MED ORDER — SODIUM CHLORIDE 0.9% FLUSH
10.0000 mL | INTRAVENOUS | Status: DC | PRN
  Administered 2018-10-09: 10 mL
  Filled 2018-10-09: qty 10

## 2018-10-09 NOTE — Progress Notes (Signed)
No new changes noted today 

## 2018-10-09 NOTE — Progress Notes (Signed)
Hematology/Oncology Consult note [facility]  [phone] [phone]  Patient Care Team: Lin Givens, MD as PCP - General (Family Medicine) Clent Jacks, RN as Registered Nurse   Name of the patient: Latoya Jones  [id number]  [birth year]   Date of visit: 10/09/18  Diagnosis- adenocarcinoma of the colon at least stage IIIC pT4b pN2aMxstatus post hemicolectomy.Subsequently patient found to have a peritoneal nodule as well as liver metastases and is currently being treated as stage IV colon cancer   Chief complaint/ Reason for visit-on treatment assessment prior to cycle 4 of palliative FOLFIRI Avastin chemotherapy  Heme/Onc history: Patient is a 72 year old female with a h/o developmental and mood disoredr who resides at a group home. She presented to the ER with symptoms of dizziness mostly when she stands up. She has had problems with constipation in the past and takes prune juice to relieve it. Patient is a poor historian at baseline and complained of abdominal pain in ER which led to CT abdomen. 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  CT chest without contrast showed tiny 2 mm right upper lobe pulmonary nodule. No other evidence of metastatic disease. Baseline CEA elevated at 4.8  Patient underwent exploratory laparotomy with right hemicolectomy and terminal ileum resection as well as separate small bowel resection on 12/06/2017. Operative findings showed a large cecal tumor with extramural spread to retroperitoneum mesentery, mesentery lymph nodes and parietal peritoneum. Dense inflammatory response of the retroperitoneum and small bowel.There was a second portion of small bowel that was involved and was resected. A small portion of free tumor was identified in the retroperitoneum which was sent off for examination as well.  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 Radial margin positive for invasive carcinoma 2 colon adenomas 2.5 and 1.8 cm of the ascending colon. Metastatic adenocarcinoma in 4 out of 13 lymph nodes. 2 tumor deposits Segment of small intestine with abscess which also showed adenocarcinoma invading the distal appendix with perforation. One mesenteric lymph node negative for malignancy. Retroperitoneal tumor removal was also positive for mucinous adenocarcinoma. Tumor was grade 2. Proximal and distal margins were negative.pT4bpN2a. MSI stable. KRAS mutation positive  Case discussed at tumor board. PET CT will not be done at this time as it may represent post surgical changes. Radiation oncology does not recommend adjuvant RT upon completion of chemotherapy.she was found to have recurrent small bowel soft tissue mass and peritoneal nodularity after 5 cycles of folfox. FOLFOX avastin to be given with a palliative intent. oxaliplatin discontinued after 5 cycles due to progressive thrombocytopenia requiring treatment interruptions  Scans in sept showed stable primary mass but increase in size of liver lesions. FOLFIRI avastin started on 07/31/18   Interval history-she has baseline fatigue.  She is here with her caregiver today who reports that her cognitive function is have been relatively stable after a dip in the last 2 months.  She denies any nausea vomiting or diarrhea.  Denies any pain  ECOG PS- 2 Pain scale- 0 Opioid associated constipation- no  Review of systems- Review of Systems  Constitutional: Positive for malaise/fatigue. Negative for chills, fever and weight loss.  HENT: Negative for congestion, ear discharge and nosebleeds.   Eyes: Negative for blurred vision.  Respiratory: Negative for cough, hemoptysis, sputum production, shortness of breath and wheezing.   Cardiovascular: Negative for chest pain, palpitations, orthopnea and claudication.  Gastrointestinal: Negative for abdominal pain, blood in  stool, constipation, diarrhea, heartburn, melena, nausea and vomiting.  Genitourinary: Negative for dysuria, flank pain, frequency, hematuria and urgency.  Musculoskeletal: Negative for back pain, joint pain and myalgias.  Skin: Negative for rash.  Neurological: Negative for dizziness, tingling, focal weakness, seizures, weakness and headaches.  Endo/Heme/Allergies: Does not bruise/bleed easily.  Psychiatric/Behavioral: Negative for depression and suicidal ideas. The patient does not have insomnia.       No Known Allergies   Past Medical History:  Diagnosis Date  . Colon cancer ([place])   . Hypertension      Past Surgical History:  Procedure Laterality Date  . COLOSTOMY REVISION N/A 12/06/2017   Procedure: COLON RESECTION RIGHT;  Surgeon: Florene Glen, MD;  Location: [facility] ORS;  Service: General;  Laterality: N/A;  . DILATION AND CURETTAGE, DIAGNOSTIC / THERAPEUTIC    . PORTACATH PLACEMENT N/A 01/06/2018   Procedure: INSERTION PORT-A-CATH;  Surgeon: Vickie Epley, MD;  Location: [facility] ORS;  Service: General;  Laterality: N/A;    Social History   Socioeconomic History  . Marital status: Widowed    Spouse name: Not on file  . Number of children: Not on file  . Years of education: Not on file  . Highest education level: Not on file  Occupational History  . Not on file  Social Needs  . Financial resource strain: Patient refused  . Food insecurity:    Worry: Patient refused    Inability: Patient refused  . Transportation needs:    Medical: Patient refused    Non-medical: Patient refused  Tobacco Use  . Smoking status: Former Smoker    Last attempt to quit: 11/23/2007    Years since quitting: 10.8  . Smokeless tobacco: Never Used  Substance and Sexual Activity  . Alcohol use: No  . Drug use: No  . Sexual activity: Never  Lifestyle  . Physical activity:    Days per week: Patient refused    Minutes per session: Patient refused  . Stress: Patient refused    Relationships  . Social connections:    Talks on phone: Patient refused    Gets together: Patient refused    Attends religious service: Patient refused    Active member of club or organization: Patient refused    Attends meetings of clubs or organizations: Patient refused    Relationship status: Patient refused  . Intimate partner violence:    Fear of current or ex partner: Patient refused    Emotionally abused: Patient refused    Physically abused: Patient refused    Forced sexual activity: Patient refused  Other Topics Concern  . Not on file  Social History Narrative  . Not on file    Family History  Problem Relation Age of Onset  . ALS Mother   . ALS Father      Current Outpatient Medications:  .  alendronate (FOSAMAX) 70 MG tablet, Take 70 mg by mouth once a week., Disp: , Rfl:  .  amLODipine (NORVASC) 10 MG tablet, Take 10 mg by mouth daily. , Disp: , Rfl:  .  ARIPiprazole (ABILIFY) 5 MG tablet, Take 7.5 mg by mouth at bedtime. , Disp: , Rfl:  .  benztropine (COGENTIN) 0.5 MG tablet, Take 0.5 mg by mouth at bedtime., Disp: , Rfl:  .  Calcium 600-200 MG-UNIT tablet, Take 1 tablet by mouth daily., Disp: , Rfl:  .  memantine (NAMENDA XR) 14 MG CP24 24 hr capsule, Take 14 mg by mouth at bedtime. , Disp: , Rfl:  .  metoprolol succinate (TOPROL-XL) 50 MG 24 hr tablet, Take 50 mg by mouth every morning. Take with or immediately following a meal. , Disp: , Rfl:  .  Multiple Vitamins-Minerals (SENTRY SENIOR PO), Take 1 tablet by mouth daily., Disp: , Rfl:  .  perphenazine (TRILAFON) 4 MG tablet, Take 4 mg by mouth 2 (two) times daily., Disp: , Rfl:  .  acetaminophen (TYLENOL) 500 MG tablet, Take 1,000 mg by mouth every 8 (eight) hours as needed for mild pain., Disp: , Rfl:  .  calcium carbonate (TUMS - DOSED IN MG ELEMENTAL CALCIUM) 500 MG chewable tablet, Chew 1 tablet by mouth 2 (two) times daily as needed for indigestion or heartburn., Disp: , Rfl:  .  lidocaine-prilocaine  (EMLA) cream, Apply 1 application topically as needed. 1 hour prior to  each chemotherapy treatment. Place small amount over port site and place saran wrap over the cream to protect clothing (Patient not taking: Reported on 10/09/2018), Disp: 30 g, Rfl: 0 .  loratadine (CLARITIN) 10 MG tablet, Take 1 tablet (10 mg total) by mouth daily as needed for allergies. Take 1 tablet the day before pump removal, 1 tablet the day of pump removal and 1 tablet the day after pump removal. Take as directed with each cycle of  chemotherapy treatment (Patient not taking: Reported on 10/09/2018), Disp: 30 tablet, Rfl: 1 .  ondansetron (ZOFRAN) 8 MG tablet, Take 1 tablet (8 mg total) by mouth 2 (two) times daily as needed for nausea or vomiting. Can start using on day 3 after chemo (Patient not taking: Reported on 10/02/2018), Disp: 20 tablet, Rfl: 0 .  prochlorperazine (COMPAZINE) 10 MG tablet, Take 1 tablet (10 mg total) by mouth every 6 (six) hours as needed for nausea or vomiting. (Patient not taking: Reported on 10/02/2018), Disp: 30 tablet, Rfl: 0 No current facility-administered medications for this visit.   Facility-Administered Medications Ordered in Other Visits:  .  dextrose 5 % solution, , Intravenous, Once, Sindy Guadeloupe, MD .  fluorouracil (ADRUCIL) 4,800 mg in sodium chloride 0.9 % 54 mL chemo infusion, 2,400 mg/m2 (Treatment Plan Recorded), Intravenous, 1 day or 1 dose, Sindy Guadeloupe, MD .  fluorouracil (ADRUCIL) chemo injection 800 mg, 400 mg/m2 (Treatment Plan Recorded), Intravenous, Once, Sindy Guadeloupe, MD .  heparin lock flush 100 unit/mL, 500 Units, Intravenous, Once, Sindy Guadeloupe, MD .  heparin lock flush 100 unit/mL, 500 Units, Intracatheter, Once PRN, Sindy Guadeloupe, MD .  heparin lock flush 100 unit/mL, 500 Units, Intravenous, Once, Sindy Guadeloupe, MD .  heparin lock flush 100 unit/mL, 500 Units, Intracatheter, Once PRN, Sindy Guadeloupe, MD .  irinotecan (CAMPTOSAR) 240 mg in dextrose 5 % 500  mL chemo infusion, 125 mg/m2 (Treatment Plan Recorded), Intravenous, Once, Sindy Guadeloupe, MD, Last Rate: 341 mL/hr at 10/09/18 1110, 240 mg at 10/09/18 1110 .  leucovorin 800 mg in dextrose 5 % 250 mL infusion, 800 mg, Intravenous, Once, Sindy Guadeloupe, MD, Last Rate: 193 mL/hr at 10/09/18 1108, 800 mg at 10/09/18 1108 .  sodium chloride flush (NS) 0.9 % injection 10 mL, 10 mL, Intravenous, PRN, Sindy Guadeloupe, MD, 10 mL at 02/06/18 0845 .  sodium chloride flush (NS) 0.9 % injection 10 mL, 10 mL, Intracatheter, PRN, Sindy Guadeloupe, MD .  sodium chloride flush (NS) 0.9 % injection 10 mL, 10 mL, Intracatheter, PRN, Sindy Guadeloupe, MD, 10 mL at 03/06/18 0900 .  sodium chloride flush (NS) 0.9 % injection  10 mL, 10 mL, Intravenous, PRN, Sindy Guadeloupe, MD, 10 mL at 03/27/18 0909 .  sodium chloride flush (NS) 0.9 % injection 10 mL, 10 mL, Intracatheter, PRN, Sindy Guadeloupe, MD, 10 mL at 10/09/18 0845  Physical exam:  Vitals:   10/09/18 0919  BP: (!) 145/76  Pulse: (!) 59  Resp: 18  Temp: (!) 97.3 F (36.3 C)  TempSrc: Tympanic  Weight: 176 lb 12.9 oz (80.2 kg)  Height: '5\' 5"'  (1.651 m)   Physical Exam  Constitutional: She is oriented to person, place, and time. She appears well-developed and well-nourished.  Appears fatigued  HENT:  Head: Normocephalic and atraumatic.  Eyes: Pupils are equal, round, and reactive to light. EOM are normal.  Neck: Normal range of motion.  Cardiovascular: Normal rate, regular rhythm and normal heart sounds.  Pulmonary/Chest: Effort normal and breath sounds normal.  Abdominal: Soft. Bowel sounds are normal.  Musculoskeletal: She exhibits edema (b/l ankle Edema).  Neurological: She is alert and oriented to person, place, and time.  Skin: Skin is warm and dry.     CMP Latest Ref Rng & Units 10/09/2018  Glucose 70 - 99 mg/dL 119(H)  BUN 8 - 23 mg/dL 8  Creatinine 0.44 - 1.00 mg/dL 0.82  Sodium 135 - 145 mmol/L 137  Potassium 3.5 - 5.1 mmol/L 3.9  Chloride  98 - 111 mmol/L 104  CO2 22 - 32 mmol/L 24  Calcium 8.9 - 10.3 mg/dL 9.3  Total Protein 6.5 - 8.1 g/dL 6.7  Total Bilirubin 0.3 - 1.2 mg/dL 0.5  Alkaline Phos 38 - 126 U/L 83  AST 15 - 41 U/L 38  ALT 0 - 44 U/L 31   CBC Latest Ref Rng & Units 10/09/2018  WBC 4.0 - 10.5 K/uL 2.9(L)  Hemoglobin 12.0 - 15.0 g/dL 12.1  Hematocrit 36.0 - 46.0 % 36.5  Platelets 150 - 400 K/uL 165    Assessment and plan- Patient is a 72 y.o. female with stage IV: Adenocarcinoma with peritoneal metastases and possible liver metastases.    She is here for on treatment assessment prior to cycle 4 palliative FOLFIRI and Avastin chemotherapy  Patient has a low white count today of 2.9 but her ANC is 1.1.  She will therefore proceed with cycle 4 of palliative FOLFIRI Avastin chemotherapy today and receive on pro-Neulasta on day 3.  Her hemoglobin and platelet counts are stable.  Her CEA is trending down after switching to FOLFIRI.  I will see her back in 3 weeks time with CBC CMP and CEA for cycle palliative FOLFIRI and Avastin chemotherapy.  She will have been getting CT chest abdomen and pelvis prior to assess for response   Visit Diagnosis 1. Metastatic colon cancer to liver ([place])   2. Encounter for antineoplastic chemotherapy   3. Encounter for monoclonal antibody treatment for malignancy      Dr. Randa Evens, MD, MPH [facility] at [facility] [phone] 10/09/2018 11:16 AM

## 2018-10-11 ENCOUNTER — Inpatient Hospital Stay: Payer: Medicare Other

## 2018-10-11 VITALS — BP 146/80 | HR 61 | Temp 98.6°F | Resp 18

## 2018-10-11 DIAGNOSIS — C189 Malignant neoplasm of colon, unspecified: Secondary | ICD-10-CM

## 2018-10-11 DIAGNOSIS — Z5111 Encounter for antineoplastic chemotherapy: Secondary | ICD-10-CM | POA: Diagnosis not present

## 2018-10-11 MED ORDER — SODIUM CHLORIDE 0.9% FLUSH
10.0000 mL | INTRAVENOUS | Status: DC | PRN
  Administered 2018-10-11: 10 mL
  Filled 2018-10-11: qty 10

## 2018-10-11 MED ORDER — PEGFILGRASTIM INJECTION 6 MG/0.6ML ~~LOC~~
6.0000 mg | PREFILLED_SYRINGE | Freq: Once | SUBCUTANEOUS | Status: AC
  Administered 2018-10-11: 6 mg via SUBCUTANEOUS

## 2018-10-11 MED ORDER — HEPARIN SOD (PORK) LOCK FLUSH 100 UNIT/ML IV SOLN
500.0000 [IU] | Freq: Once | INTRAVENOUS | Status: AC | PRN
  Administered 2018-10-11: 500 [IU]

## 2018-10-11 NOTE — Patient Instructions (Signed)
Pegfilgrastim injection What is this medicine? PEGFILGRASTIM (PEG fil gra stim) is a long-acting granulocyte colony-stimulating factor that stimulates the growth of neutrophils, a type of white blood cell important in the body's fight against infection. It is used to reduce the incidence of fever and infection in patients with certain types of cancer who are receiving chemotherapy that affects the bone marrow, and to increase survival after being exposed to high doses of radiation. This medicine may be used for other purposes; ask your health care provider or pharmacist if you have questions. COMMON BRAND NAME(S): Neulasta What should I tell my health care provider before I take this medicine? They need to know if you have any of these conditions: -kidney disease -latex allergy -ongoing radiation therapy -sickle cell disease -skin reactions to acrylic adhesives (On-Body Injector only) -an unusual or allergic reaction to pegfilgrastim, filgrastim, other medicines, foods, dyes, or preservatives -pregnant or trying to get pregnant -breast-feeding How should I use this medicine? This medicine is for injection under the skin. If you get this medicine at home, you will be taught how to prepare and give the pre-filled syringe or how to use the On-body Injector. Refer to the patient Instructions for Use for detailed instructions. Use exactly as directed. Tell your healthcare provider immediately if you suspect that the On-body Injector may not have performed as intended or if you suspect the use of the On-body Injector resulted in a missed or partial dose. It is important that you put your used needles and syringes in a special sharps container. Do not put them in a trash can. If you do not have a sharps container, call your pharmacist or healthcare provider to get one. Talk to your pediatrician regarding the use of this medicine in children. While this drug may be prescribed for selected conditions,  precautions do apply. Overdosage: If you think you have taken too much of this medicine contact a poison control center or emergency room at once. NOTE: This medicine is only for you. Do not share this medicine with others. What if I miss a dose? It is important not to miss your dose. Call your doctor or health care professional if you miss your dose. If you miss a dose due to an On-body Injector failure or leakage, a new dose should be administered as soon as possible using a single prefilled syringe for manual use. What may interact with this medicine? Interactions have not been studied. Give your health care provider a list of all the medicines, herbs, non-prescription drugs, or dietary supplements you use. Also tell them if you smoke, drink alcohol, or use illegal drugs. Some items may interact with your medicine. This list may not describe all possible interactions. Give your health care provider a list of all the medicines, herbs, non-prescription drugs, or dietary supplements you use. Also tell them if you smoke, drink alcohol, or use illegal drugs. Some items may interact with your medicine. What should I watch for while using this medicine? You may need blood work done while you are taking this medicine. If you are going to need a MRI, CT scan, or other procedure, tell your doctor that you are using this medicine (On-Body Injector only). What side effects may I notice from receiving this medicine? Side effects that you should report to your doctor or health care professional as soon as possible: -allergic reactions like skin rash, itching or hives, swelling of the face, lips, or tongue -dizziness -fever -pain, redness, or irritation at site   where injected -pinpoint red spots on the skin -red or dark-brown urine -shortness of breath or breathing problems -stomach or side pain, or pain at the shoulder -swelling -tiredness -trouble passing urine or change in the amount of urine Side  effects that usually do not require medical attention (report to your doctor or health care professional if they continue or are bothersome): -bone pain -muscle pain This list may not describe all possible side effects. Call your doctor for medical advice about side effects. You may report side effects to FDA at 1-800-FDA-1088. Where should I keep my medicine? Keep out of the reach of children. Store pre-filled syringes in a refrigerator between 2 and 8 degrees C (36 and 46 degrees F). Do not freeze. Keep in carton to protect from light. Throw away this medicine if it is left out of the refrigerator for more than 48 hours. Throw away any unused medicine after the expiration date. NOTE: This sheet is a summary. It may not cover all possible information. If you have questions about this medicine, talk to your doctor, pharmacist, or health care provider.  2018 Elsevier/Gold Standard (2016-11-04 12:58:03)  

## 2018-10-24 ENCOUNTER — Ambulatory Visit
Admission: RE | Admit: 2018-10-24 | Discharge: 2018-10-24 | Disposition: A | Discharge status: Home / Self Care | Payer: Medicare Other | Source: Ambulatory Visit | Attending: Oncology | Admitting: Oncology

## 2018-10-24 DIAGNOSIS — C182 Malignant neoplasm of ascending colon: Secondary | ICD-10-CM

## 2018-10-24 MED ORDER — IOPAMIDOL (ISOVUE-300) INJECTION 61%
100.0000 mL | Freq: Once | INTRAVENOUS | Status: AC | PRN
  Administered 2018-10-24: 100 mL via INTRAVENOUS

## 2018-10-30 ENCOUNTER — Inpatient Hospital Stay: Payer: Medicare Other | Attending: Oncology | Admitting: Oncology

## 2018-10-30 ENCOUNTER — Inpatient Hospital Stay: Payer: Medicare Other

## 2018-10-30 ENCOUNTER — Encounter: Payer: Self-pay | Admitting: Oncology

## 2018-10-30 VITALS — BP 160/83 | HR 65 | Temp 96.6°F | Resp 18

## 2018-10-30 VITALS — BP 164/81 | HR 55 | Temp 96.7°F | Ht 65.0 in | Wt 176.5 lb

## 2018-10-30 DIAGNOSIS — F200 Paranoid schizophrenia: Secondary | ICD-10-CM | POA: Diagnosis not present

## 2018-10-30 DIAGNOSIS — C787 Secondary malignant neoplasm of liver and intrahepatic bile duct: Secondary | ICD-10-CM | POA: Diagnosis not present

## 2018-10-30 DIAGNOSIS — R911 Solitary pulmonary nodule: Secondary | ICD-10-CM | POA: Diagnosis not present

## 2018-10-30 DIAGNOSIS — C182 Malignant neoplasm of ascending colon: Secondary | ICD-10-CM

## 2018-10-30 DIAGNOSIS — Z79899 Other long term (current) drug therapy: Secondary | ICD-10-CM | POA: Diagnosis not present

## 2018-10-30 DIAGNOSIS — Z5111 Encounter for antineoplastic chemotherapy: Secondary | ICD-10-CM | POA: Insufficient documentation

## 2018-10-30 DIAGNOSIS — R197 Diarrhea, unspecified: Secondary | ICD-10-CM | POA: Diagnosis not present

## 2018-10-30 DIAGNOSIS — Z87891 Personal history of nicotine dependence: Secondary | ICD-10-CM | POA: Insufficient documentation

## 2018-10-30 DIAGNOSIS — C785 Secondary malignant neoplasm of large intestine and rectum: Secondary | ICD-10-CM

## 2018-10-30 DIAGNOSIS — C189 Malignant neoplasm of colon, unspecified: Secondary | ICD-10-CM

## 2018-10-30 DIAGNOSIS — C786 Secondary malignant neoplasm of retroperitoneum and peritoneum: Secondary | ICD-10-CM | POA: Diagnosis not present

## 2018-10-30 DIAGNOSIS — Z9049 Acquired absence of other specified parts of digestive tract: Secondary | ICD-10-CM | POA: Diagnosis not present

## 2018-10-30 LAB — CBC WITH DIFFERENTIAL/PLATELET
Abs Immature Granulocytes: 0.07 10*3/uL (ref 0.00–0.07)
Basophils Absolute: 0.1 10*3/uL (ref 0.0–0.1)
Basophils Relative: 1 %
Eosinophils Absolute: 0.1 10*3/uL (ref 0.0–0.5)
Eosinophils Relative: 1 %
HCT: 36.9 % (ref 36.0–46.0)
Hemoglobin: 12.2 g/dL (ref 12.0–15.0)
Immature Granulocytes: 1 %
LYMPHS PCT: 19 %
Lymphs Abs: 1.7 10*3/uL (ref 0.7–4.0)
MCH: 33.9 pg (ref 26.0–34.0)
MCHC: 33.1 g/dL (ref 30.0–36.0)
MCV: 102.5 fL — ABNORMAL HIGH (ref 80.0–100.0)
Monocytes Absolute: 0.8 10*3/uL (ref 0.1–1.0)
Monocytes Relative: 9 %
Neutro Abs: 6.3 10*3/uL (ref 1.7–7.7)
Neutrophils Relative %: 69 %
Platelets: 220 10*3/uL (ref 150–400)
RBC: 3.6 MIL/uL — ABNORMAL LOW (ref 3.87–5.11)
RDW: 15.5 % (ref 11.5–15.5)
WBC: 9 10*3/uL (ref 4.0–10.5)
nRBC: 0 % (ref 0.0–0.2)

## 2018-10-30 LAB — COMPREHENSIVE METABOLIC PANEL
ALK PHOS: 84 U/L (ref 38–126)
ALT: 22 U/L (ref 0–44)
AST: 30 U/L (ref 15–41)
Albumin: 3.8 g/dL (ref 3.5–5.0)
Anion gap: 8 (ref 5–15)
BUN: 9 mg/dL (ref 8–23)
CO2: 24 mmol/L (ref 22–32)
CREATININE: 0.78 mg/dL (ref 0.44–1.00)
Calcium: 9.3 mg/dL (ref 8.9–10.3)
Chloride: 107 mmol/L (ref 98–111)
GFR calc Af Amer: >60 mL/min (ref >60)
GFR calc non Af Amer: >60 mL/min (ref >60)
Glucose, Bld: 112 mg/dL — ABNORMAL HIGH (ref 70–99)
Potassium: 4 mmol/L (ref 3.5–5.1)
Sodium: 139 mmol/L (ref 135–145)
Total Bilirubin: 0.6 mg/dL (ref 0.3–1.2)
Total Protein: 6.5 g/dL (ref 6.5–8.1)

## 2018-10-30 LAB — PROTEIN, URINE, RANDOM: Total Protein, Urine: 30 mg/dL

## 2018-10-30 MED ORDER — PALONOSETRON HCL INJECTION 0.25 MG/5ML
0.2500 mg | Freq: Once | INTRAVENOUS | Status: AC
  Administered 2018-10-30: 0.25 mg via INTRAVENOUS
  Filled 2018-10-30: qty 5

## 2018-10-30 MED ORDER — DEXAMETHASONE SODIUM PHOSPHATE 10 MG/ML IJ SOLN
10.0000 mg | Freq: Once | INTRAMUSCULAR | Status: AC
  Administered 2018-10-30: 10 mg via INTRAVENOUS
  Filled 2018-10-30: qty 1

## 2018-10-30 MED ORDER — SODIUM CHLORIDE 0.9 % IV SOLN
800.0000 mg | Freq: Once | INTRAVENOUS | Status: AC
  Administered 2018-10-30: 800 mg via INTRAVENOUS
  Filled 2018-10-30: qty 32

## 2018-10-30 MED ORDER — DEXTROSE 5 % IV SOLN
Freq: Once | INTRAVENOUS | Status: DC
  Filled 2018-10-30: qty 250

## 2018-10-30 MED ORDER — LEUCOVORIN CALCIUM INJECTION 350 MG
800.0000 mg | Freq: Once | INTRAVENOUS | Status: AC
  Administered 2018-10-30: 800 mg via INTRAVENOUS
  Filled 2018-10-30: qty 35

## 2018-10-30 MED ORDER — FLUOROURACIL CHEMO INJECTION 2.5 GM/50ML
400.0000 mg/m2 | Freq: Once | INTRAVENOUS | Status: AC
  Administered 2018-10-30: 800 mg via INTRAVENOUS
  Filled 2018-10-30: qty 16

## 2018-10-30 MED ORDER — SODIUM CHLORIDE 0.9% FLUSH
10.0000 mL | INTRAVENOUS | Status: DC | PRN
  Administered 2018-10-30: 10 mL via INTRAVENOUS
  Filled 2018-10-30: qty 10

## 2018-10-30 MED ORDER — SODIUM CHLORIDE 0.9 % IV SOLN
2400.0000 mg/m2 | INTRAVENOUS | Status: DC
  Administered 2018-10-30: 4800 mg via INTRAVENOUS
  Filled 2018-10-30: qty 96

## 2018-10-30 MED ORDER — IRINOTECAN HCL CHEMO INJECTION 100 MG/5ML
125.0000 mg/m2 | Freq: Once | INTRAVENOUS | Status: AC
  Administered 2018-10-30: 240 mg via INTRAVENOUS
  Filled 2018-10-30: qty 10

## 2018-10-30 MED ORDER — ATROPINE SULFATE 1 MG/ML IJ SOLN
0.5000 mg | Freq: Once | INTRAMUSCULAR | Status: AC
  Administered 2018-10-30: 0.5 mg via INTRAVENOUS
  Filled 2018-10-30: qty 1

## 2018-10-30 MED ORDER — SODIUM CHLORIDE 0.9 % IV SOLN
Freq: Once | INTRAVENOUS | Status: AC
  Administered 2018-10-30: 10:00:00 via INTRAVENOUS
  Filled 2018-10-30: qty 250

## 2018-10-30 NOTE — Progress Notes (Signed)
Hematology/Oncology Consult note [facility]  Telephone:([phone] [phone]  Patient Care Team: Lin Givens, MD as PCP - General (Family Medicine) Clent Jacks, RN as Registered Nurse   Name of the patient: Latoya Jones  [id number]  1946/09/21   Date of visit: 10/30/18  Diagnosis- adenocarcinoma of the colon at least stage IIIC pT4b pN2aMxstatus post hemicolectomy.Subsequently patient found to have a peritoneal nodule as well as liver metastases and is currently being treated as stage IV colon cancer   Chief complaint/ Reason for visit- on treatment assessment prior to cycle 5 of FOLFIRI avastin chemotherapy  Heme/Onc history:  Patient is a 72 year old female with a h/o developmental and mood disorder who resides at a group home. She presented to the ER with symptoms of dizziness mostly when she stands up. She has had problems with constipation in the past and takes prune juice to relieve it. Patient is a poor historian at baseline and complained of abdominal pain in ER which led to CT abdomen. 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  CT chest without contrast showed tiny 2 mm right upper lobe pulmonary nodule. No other evidence of metastatic disease. Baseline CEA elevated at 4.8  Patient underwent exploratory laparotomy with right hemicolectomy and terminal ileum resection as well as separate small bowel resection on 12/06/2017. Operative findings showed a large cecal tumor with extramural spread to retroperitoneum mesentery, mesentery lymph nodes and parietal peritoneum. Dense inflammatory response of the retroperitoneum and small bowel.There was a second portion of small bowel that was involved and was resected. A small portion of free tumor was identified in the retroperitoneum which was sent off for examination as well.  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 Radial margin positive for invasive carcinoma 2 colon adenomas 2.5 and 1.8 cm of the ascending colon. Metastatic adenocarcinoma in 4 out of 13 lymph nodes. 2 tumor deposits Segment of small intestine with abscess which also showed adenocarcinoma invading the distal appendix with perforation. One mesenteric lymph node negative for malignancy. Retroperitoneal tumor removal was also positive for mucinous adenocarcinoma. Tumor was grade 2. Proximal and distal margins were negative.pT4bpN2a. MSI stable. KRAS mutation positive  Case discussed at tumor board. PET CT will not be done at this time as it may represent post surgical changes. Radiation oncology does not recommend adjuvant RT upon completion of chemotherapy.she was found to have recurrent small bowel soft tissue mass and peritoneal nodularity after 5 cycles of folfox. FOLFOX avastin to be given with a palliative intent. oxaliplatin discontinued after 5 cycles due to progressive thrombocytopenia requiring treatment interruptions  Scans in sept showed stable primary mass but increase in size of liver lesions. FOLFIRI avastin started on 07/31/18   Interval history- she has chronic fatigue. Her cognitive abilities are slower over the last couple of months. No falls. Caregiver reports her thinking is sometimes clouded and she needs to me told what to do tim and again for first few days after chemo and then it starts getting slowly better. Had 1 episode of diarrhea  ECOG PS- 2 Pain scale- 0 Opioid associated constipation- no  Review of systems- Review of Systems  Constitutional: Positive for malaise/fatigue. Negative for chills, fever and weight loss.  HENT: Negative for congestion, ear discharge and nosebleeds.   Eyes: Negative for blurred vision.  Respiratory: Negative for cough, hemoptysis, sputum production, shortness of breath and wheezing.   Cardiovascular: Negative for chest  pain, palpitations, orthopnea and  claudication.  Gastrointestinal: Negative for abdominal pain, blood in stool, constipation, diarrhea, heartburn, melena, nausea and vomiting.  Genitourinary: Negative for dysuria, flank pain, frequency, hematuria and urgency.  Musculoskeletal: Negative for back pain, joint pain and myalgias.  Skin: Negative for rash.  Neurological: Negative for dizziness, tingling, focal weakness, seizures, weakness and headaches.  Endo/Heme/Allergies: Does not bruise/bleed easily.  Psychiatric/Behavioral: Negative for depression and suicidal ideas. The patient does not have insomnia.       No Known Allergies   Past Medical History:  Diagnosis Date  . Colon cancer ([place])   . Hypertension      Past Surgical History:  Procedure Laterality Date  . COLOSTOMY REVISION N/A 12/06/2017   Procedure: COLON RESECTION RIGHT;  Surgeon: Florene Glen, MD;  Location: [facility] ORS;  Service: General;  Laterality: N/A;  . DILATION AND CURETTAGE, DIAGNOSTIC / THERAPEUTIC    . PORTACATH PLACEMENT N/A 01/06/2018   Procedure: INSERTION PORT-A-CATH;  Surgeon: Vickie Epley, MD;  Location: [facility] ORS;  Service: General;  Laterality: N/A;    Social History   Socioeconomic History  . Marital status: Widowed    Spouse name: Not on file  . Number of children: Not on file  . Years of education: Not on file  . Highest education level: Not on file  Occupational History  . Not on file  Social Needs  . Financial resource strain: Patient refused  . Food insecurity:    Worry: Patient refused    Inability: Patient refused  . Transportation needs:    Medical: Patient refused    Non-medical: Patient refused  Tobacco Use  . Smoking status: Former Smoker    Last attempt to quit: 11/23/2007    Years since quitting: 10.9  . Smokeless tobacco: Never Used  Substance and Sexual Activity  . Alcohol use: No  . Drug use: No  . Sexual activity: Never  Lifestyle  . Physical activity:    Days per week: Patient refused     Minutes per session: Patient refused  . Stress: Patient refused  Relationships  . Social connections:    Talks on phone: Patient refused    Gets together: Patient refused    Attends religious service: Patient refused    Active member of club or organization: Patient refused    Attends meetings of clubs or organizations: Patient refused    Relationship status: Patient refused  . Intimate partner violence:    Fear of current or ex partner: Patient refused    Emotionally abused: Patient refused    Physically abused: Patient refused    Forced sexual activity: Patient refused  Other Topics Concern  . Not on file  Social History Narrative  . Not on file    Family History  Problem Relation Age of Onset  . ALS Mother   . ALS Father      Current Outpatient Medications:  .  acetaminophen (TYLENOL) 500 MG tablet, Take 1,000 mg by mouth every 8 (eight) hours as needed for mild pain., Disp: , Rfl:  .  alendronate (FOSAMAX) 70 MG tablet, Take 70 mg by mouth once a week., Disp: , Rfl:  .  amLODipine (NORVASC) 10 MG tablet, Take 10 mg by mouth daily. , Disp: , Rfl:  .  ARIPiprazole (ABILIFY) 15 MG tablet, Take 7.5 mg by mouth daily., Disp: , Rfl:  .  benztropine (COGENTIN) 0.5 MG tablet, Take 0.5 mg by mouth at bedtime., Disp: , Rfl:  .  Calcium  600-200 MG-UNIT tablet, Take 1 tablet by mouth daily., Disp: , Rfl:  .  calcium carbonate (TUMS - DOSED IN MG ELEMENTAL CALCIUM) 500 MG chewable tablet, Chew 1 tablet by mouth 2 (two) times daily as needed for indigestion or heartburn., Disp: , Rfl:  .  lidocaine-prilocaine (EMLA) cream, Apply 1 application topically as needed. 1 hour prior to  each chemotherapy treatment. Place small amount over port site and place saran wrap over the cream to protect clothing, Disp: 30 g, Rfl: 0 .  loratadine (CLARITIN) 10 MG tablet, Take 1 tablet (10 mg total) by mouth daily as needed for allergies. Take 1 tablet the day before pump removal, 1 tablet the day of pump  removal and 1 tablet the day after pump removal. Take as directed with each cycle of  chemotherapy treatment, Disp: 30 tablet, Rfl: 1 .  memantine (NAMENDA XR) 14 MG CP24 24 hr capsule, Take 14 mg by mouth at bedtime. , Disp: , Rfl:  .  metoprolol succinate (TOPROL-XL) 50 MG 24 hr tablet, Take 50 mg by mouth every morning. Take with or immediately following a meal. , Disp: , Rfl:  .  Multiple Vitamins-Minerals (SENTRY SENIOR PO), Take 1 tablet by mouth daily., Disp: , Rfl:  .  ondansetron (ZOFRAN) 8 MG tablet, Take 1 tablet (8 mg total) by mouth 2 (two) times daily as needed for nausea or vomiting. Can start using on day 3 after chemo, Disp: 20 tablet, Rfl: 0 .  perphenazine (TRILAFON) 4 MG tablet, Take 4 mg by mouth 2 (two) times daily., Disp: , Rfl:  .  prochlorperazine (COMPAZINE) 10 MG tablet, Take 1 tablet (10 mg total) by mouth every 6 (six) hours as needed for nausea or vomiting., Disp: 30 tablet, Rfl: 0 No current facility-administered medications for this visit.   Facility-Administered Medications Ordered in Other Visits:  .  dextrose 5 % solution, , Intravenous, Once, Sindy Guadeloupe, MD .  dextrose 5 % solution, , Intravenous, Once, Sindy Guadeloupe, MD .  fluorouracil (ADRUCIL) 4,800 mg in sodium chloride 0.9 % 54 mL chemo infusion, 2,400 mg/m2 (Treatment Plan Recorded), Intravenous, 1 day or 1 dose, Sindy Guadeloupe, MD, 4,800 mg at 10/30/18 1247 .  heparin lock flush 100 unit/mL, 500 Units, Intravenous, Once, Sindy Guadeloupe, MD .  heparin lock flush 100 unit/mL, 500 Units, Intracatheter, Once PRN, Sindy Guadeloupe, MD .  heparin lock flush 100 unit/mL, 500 Units, Intravenous, Once, Sindy Guadeloupe, MD .  sodium chloride flush (NS) 0.9 % injection 10 mL, 10 mL, Intravenous, PRN, Sindy Guadeloupe, MD, 10 mL at 02/06/18 0845 .  sodium chloride flush (NS) 0.9 % injection 10 mL, 10 mL, Intracatheter, PRN, Sindy Guadeloupe, MD .  sodium chloride flush (NS) 0.9 % injection 10 mL, 10 mL, Intracatheter,  PRN, Sindy Guadeloupe, MD, 10 mL at 03/06/18 0900 .  sodium chloride flush (NS) 0.9 % injection 10 mL, 10 mL, Intravenous, PRN, Sindy Guadeloupe, MD, 10 mL at 03/27/18 0909 .  sodium chloride flush (NS) 0.9 % injection 10 mL, 10 mL, Intravenous, PRN, Sindy Guadeloupe, MD, 10 mL at 10/30/18 0835  Physical exam:  Vitals:   10/30/18 0858  BP: (!) 164/81  Pulse: (!) 55  Temp: (!) 96.7 F (35.9 C)  TempSrc: Tympanic  Weight: 176 lb 7.7 oz (80 kg)  Height: '5\' 5"'  (1.651 m)   Physical Exam  Constitutional: She is oriented to person, place, and time. She appears well-developed  and well-nourished.  Appears fatigued  HENT:  Head: Normocephalic and atraumatic.  Eyes: Pupils are equal, round, and reactive to light. EOM are normal.  Neck: Normal range of motion.  Cardiovascular: Normal rate, regular rhythm and normal heart sounds.  Pulmonary/Chest: Effort normal and breath sounds normal.  Abdominal: Soft. Bowel sounds are normal.  Musculoskeletal: She exhibits edema (trace b/l ankle edema).  Neurological: She is alert and oriented to person, place, and time.  Skin: Skin is warm and dry.     CMP Latest Ref Rng & Units 10/30/2018  Glucose 70 - 99 mg/dL 112(H)  BUN 8 - 23 mg/dL 9  Creatinine 0.44 - 1.00 mg/dL 0.78  Sodium 135 - 145 mmol/L 139  Potassium 3.5 - 5.1 mmol/L 4.0  Chloride 98 - 111 mmol/L 107  CO2 22 - 32 mmol/L 24  Calcium 8.9 - 10.3 mg/dL 9.3  Total Protein 6.5 - 8.1 g/dL 6.5  Total Bilirubin 0.3 - 1.2 mg/dL 0.6  Alkaline Phos 38 - 126 U/L 84  AST 15 - 41 U/L 30  ALT 0 - 44 U/L 22   CBC Latest Ref Rng & Units 10/30/2018  WBC 4.0 - 10.5 K/uL 9.0  Hemoglobin 12.0 - 15.0 g/dL 12.2  Hematocrit 36.0 - 46.0 % 36.9  Platelets 150 - 400 K/uL 220    No images are attached to the encounter.  Ct Chest W Contrast  Result Date: 10/24/2018 CLINICAL DATA:  Metastatic colon cancer.  Restaging. EXAM: CT CHEST, ABDOMEN, AND PELVIS WITH CONTRAST TECHNIQUE: Multidetector CT imaging of the  chest, abdomen and pelvis was performed following the standard protocol during bolus administration of intravenous contrast. CONTRAST:  153m ISOVUE-300 IOPAMIDOL (ISOVUE-300) INJECTION 61% COMPARISON:  CT scan 07/27/2018 FINDINGS: CT CHEST FINDINGS Cardiovascular: The heart is normal in size. No pericardial effusion. The aorta is normal in caliber. No dissection. Minimal scattered atherosclerotic calcifications. The branch vessels are patent. No definite coronary artery calcifications. Mediastinum/Nodes: No mediastinal or hilar mass or adenopathy. The esophagus is grossly normal. Lungs/Pleura: Few small scattered subpleural pulmonary nodules are likely benign. There is also a small calcified granuloma noted in the right middle lobe. Minimal linear basilar scarring changes but no acute pulmonary findings or pleural effusion. Musculoskeletal: No breast masses, supraclavicular or axillary adenopathy. The thyroid gland appears normal. Benign-appearing calcification noted in the left lobe. The right-sided Port-A-Cath is stable. No complicating features. No worrisome bone lesions to suggest metastatic disease. CT ABDOMEN PELVIS FINDINGS Hepatobiliary: Subtle 13.5 mm segment 7 liver lesion previously measured 18 mm. No new hepatic lesions. No intrahepatic biliary dilatation. The gallbladder is normal. No common bile duct dilatation. Pancreas: No mass, inflammation or ductal dilatation. Mild pancreatic atrophy. Spleen: Normal size.  No focal lesions. Adrenals/Urinary Tract: Stable small bilateral adrenal gland nodules. There also numerous bilateral renal cysts. Small left renal calculi are also noted. No worrisome renal lesions. The bladder is slightly distended. No bladder mass or calculi. Stomach/Bowel: The stomach, duodenum, small bowel and colon are grossly normal. Surgical changes are again noted from a prior right hemicolectomy and ileocolonic anastomosis. There is persistent soft tissue thickening near the surgical  site which may be postoperative changes. Could not exclude residual tumor. This measures approximately 3.0 x 1.9 cm and previously measured 3.4 x 2.0 cm. Small adjacent mesenteric lymph nodes appears stable. Stable soft tissue nodule involving the small bowel loop in the right upper pelvis on image number 88 measuring 13.5 mm. Stable soft tissue mass involving a small bowel loop in  the mid right pelvis on image number 96. This measures 2.4 x 1.7 cm and previously measured 2.6 x 1.9 cm. No new or progressive findings. Vascular/Lymphatic: The aorta and branch vessels are patent. The major venous structures are patent. No retroperitoneal mass or lymphadenopathy. Reproductive: The uterus and ovaries are normal. Other: No pelvic mass or adenopathy. No free pelvic fluid collections. No inguinal mass or adenopathy. Stable right-sided anterior abdominal wall hernia. Musculoskeletal: No significant bony findings. IMPRESSION: 1. No CT findings to suggest pulmonary metastatic disease. 2. Slight interval decrease in size of the right hepatic lobe metastatic lesion. No new lesions or progressive findings. 3. Stable soft tissue lesions involving the colon near the anastomosis and also 2 separate right lower quadrant small bowel loops. No new or progressive findings. Stable small scattered adjacent mesenteric nodes. Electronically Signed   By: Marijo Sanes M.D.   On: 10/24/2018 11:53   Ct Abdomen Pelvis W Contrast  Result Date: 10/24/2018 CLINICAL DATA:  Metastatic colon cancer.  Restaging. EXAM: CT CHEST, ABDOMEN, AND PELVIS WITH CONTRAST TECHNIQUE: Multidetector CT imaging of the chest, abdomen and pelvis was performed following the standard protocol during bolus administration of intravenous contrast. CONTRAST:  169m ISOVUE-300 IOPAMIDOL (ISOVUE-300) INJECTION 61% COMPARISON:  CT scan 07/27/2018 FINDINGS: CT CHEST FINDINGS Cardiovascular: The heart is normal in size. No pericardial effusion. The aorta is normal in  caliber. No dissection. Minimal scattered atherosclerotic calcifications. The branch vessels are patent. No definite coronary artery calcifications. Mediastinum/Nodes: No mediastinal or hilar mass or adenopathy. The esophagus is grossly normal. Lungs/Pleura: Few small scattered subpleural pulmonary nodules are likely benign. There is also a small calcified granuloma noted in the right middle lobe. Minimal linear basilar scarring changes but no acute pulmonary findings or pleural effusion. Musculoskeletal: No breast masses, supraclavicular or axillary adenopathy. The thyroid gland appears normal. Benign-appearing calcification noted in the left lobe. The right-sided Port-A-Cath is stable. No complicating features. No worrisome bone lesions to suggest metastatic disease. CT ABDOMEN PELVIS FINDINGS Hepatobiliary: Subtle 13.5 mm segment 7 liver lesion previously measured 18 mm. No new hepatic lesions. No intrahepatic biliary dilatation. The gallbladder is normal. No common bile duct dilatation. Pancreas: No mass, inflammation or ductal dilatation. Mild pancreatic atrophy. Spleen: Normal size.  No focal lesions. Adrenals/Urinary Tract: Stable small bilateral adrenal gland nodules. There also numerous bilateral renal cysts. Small left renal calculi are also noted. No worrisome renal lesions. The bladder is slightly distended. No bladder mass or calculi. Stomach/Bowel: The stomach, duodenum, small bowel and colon are grossly normal. Surgical changes are again noted from a prior right hemicolectomy and ileocolonic anastomosis. There is persistent soft tissue thickening near the surgical site which may be postoperative changes. Could not exclude residual tumor. This measures approximately 3.0 x 1.9 cm and previously measured 3.4 x 2.0 cm. Small adjacent mesenteric lymph nodes appears stable. Stable soft tissue nodule involving the small bowel loop in the right upper pelvis on image number 88 measuring 13.5 mm. Stable soft  tissue mass involving a small bowel loop in the mid right pelvis on image number 96. This measures 2.4 x 1.7 cm and previously measured 2.6 x 1.9 cm. No new or progressive findings. Vascular/Lymphatic: The aorta and branch vessels are patent. The major venous structures are patent. No retroperitoneal mass or lymphadenopathy. Reproductive: The uterus and ovaries are normal. Other: No pelvic mass or adenopathy. No free pelvic fluid collections. No inguinal mass or adenopathy. Stable right-sided anterior abdominal wall hernia. Musculoskeletal: No significant bony findings. IMPRESSION:  1. No CT findings to suggest pulmonary metastatic disease. 2. Slight interval decrease in size of the right hepatic lobe metastatic lesion. No new lesions or progressive findings. 3. Stable soft tissue lesions involving the colon near the anastomosis and also 2 separate right lower quadrant small bowel loops. No new or progressive findings. Stable small scattered adjacent mesenteric nodes. Electronically Signed   By: Marijo Sanes M.D.   On: 10/24/2018 11:53     Assessment and plan- Patient is a 72 y.o. female with stage IV colon Adenocarcinoma with peritoneal metastases and possible liver metastases.   She is here for on treatment assessment prior to cycle 5 of palliative FOLFIRI Avastin chemotherapy  Counts okay to proceed with cycle 5 of FOLFIRI Avastin chemotherapy today.  She will be receiving on pro-Neulasta on day 3 of pump disconnect.  I did review her CT chest abdomen and pelvis images independently and discussed findings with her and her caregiver.  Metastatic lesion to the liver has decreased in size.  There are 2 other small soft tissue densities noted around the colon and small bowel which are appearing stable.  There are no new areas of metastatic disease plan is to continue palliative FOLFIRI Avastin chemotherapy until progression or toxicity.  She was not able to tolerate chemotherapy every 2 weeks without  significant fatigue and decline in her performance status as well as some diarrhea.  She has therefore been getting chemotherapy every 3 weeks.  Her CEA is also trending down.  She will be seen in 3 weeks time on 11/21/2018 by Dr. Rogue Bussing with CBC CMP for cycle 6 of palliative FOLFIRI Avastin chemotherapy.  She will be seen 6 weeks from now on 11/27/2018 by Dr. Mike Gip with CBC CMP and CEA for cycle 7 and transfer her care to Dr. Mike Gip at that point.  Patient has not been able to give [place] a urine sample consistently as she always has small amount of bowel movement with each urine.her caregiver has been keeping a tab of her bp at her group home and most reading have been sbp 140 or less. I will therefore proceed with avastin today. Attempt getting urine protein with every treatment.    Visit Diagnosis 1. Metastatic colon cancer to liver ([place])   2. Encounter for antineoplastic chemotherapy      Dr. Randa Evens, MD, MPH [facility] [phone] 10/30/2018 3:14 PM

## 2018-10-31 LAB — CEA: CEA: 5 ng/mL — ABNORMAL HIGH (ref 0.0–4.7)

## 2018-11-01 ENCOUNTER — Inpatient Hospital Stay: Payer: Medicare Other

## 2018-11-01 VITALS — BP 151/83 | HR 61 | Temp 96.6°F | Resp 18

## 2018-11-01 DIAGNOSIS — Z5111 Encounter for antineoplastic chemotherapy: Secondary | ICD-10-CM | POA: Diagnosis not present

## 2018-11-01 DIAGNOSIS — C189 Malignant neoplasm of colon, unspecified: Secondary | ICD-10-CM

## 2018-11-01 MED ORDER — HEPARIN SOD (PORK) LOCK FLUSH 100 UNIT/ML IV SOLN
500.0000 [IU] | Freq: Once | INTRAVENOUS | Status: AC | PRN
  Administered 2018-11-01: 500 [IU]
  Filled 2018-11-01: qty 5

## 2018-11-01 MED ORDER — PEGFILGRASTIM INJECTION 6 MG/0.6ML ~~LOC~~
6.0000 mg | PREFILLED_SYRINGE | Freq: Once | SUBCUTANEOUS | Status: AC
  Administered 2018-11-01: 6 mg via SUBCUTANEOUS
  Filled 2018-11-01: qty 0.6

## 2018-11-01 MED ORDER — SODIUM CHLORIDE 0.9% FLUSH
10.0000 mL | INTRAVENOUS | Status: DC | PRN
  Administered 2018-11-01: 10 mL
  Filled 2018-11-01: qty 10

## 2018-11-21 ENCOUNTER — Inpatient Hospital Stay: Payer: Medicare Other

## 2018-11-21 ENCOUNTER — Other Ambulatory Visit: Payer: Self-pay

## 2018-11-21 ENCOUNTER — Inpatient Hospital Stay (HOSPITAL_BASED_OUTPATIENT_CLINIC_OR_DEPARTMENT_OTHER): Payer: Medicare Other | Admitting: Internal Medicine

## 2018-11-21 VITALS — BP 147/72 | HR 56 | Temp 96.0°F | Resp 20 | Ht 65.0 in | Wt 172.0 lb

## 2018-11-21 VITALS — BP 151/83 | HR 62 | Temp 97.0°F | Resp 18

## 2018-11-21 DIAGNOSIS — C182 Malignant neoplasm of ascending colon: Secondary | ICD-10-CM

## 2018-11-21 DIAGNOSIS — C787 Secondary malignant neoplasm of liver and intrahepatic bile duct: Secondary | ICD-10-CM

## 2018-11-21 DIAGNOSIS — Z79899 Other long term (current) drug therapy: Secondary | ICD-10-CM

## 2018-11-21 DIAGNOSIS — F200 Paranoid schizophrenia: Secondary | ICD-10-CM

## 2018-11-21 DIAGNOSIS — Z5111 Encounter for antineoplastic chemotherapy: Secondary | ICD-10-CM | POA: Diagnosis not present

## 2018-11-21 DIAGNOSIS — C785 Secondary malignant neoplasm of large intestine and rectum: Secondary | ICD-10-CM | POA: Diagnosis not present

## 2018-11-21 DIAGNOSIS — C786 Secondary malignant neoplasm of retroperitoneum and peritoneum: Secondary | ICD-10-CM

## 2018-11-21 DIAGNOSIS — R911 Solitary pulmonary nodule: Secondary | ICD-10-CM

## 2018-11-21 DIAGNOSIS — R197 Diarrhea, unspecified: Secondary | ICD-10-CM

## 2018-11-21 DIAGNOSIS — C189 Malignant neoplasm of colon, unspecified: Secondary | ICD-10-CM

## 2018-11-21 LAB — CBC WITH DIFFERENTIAL/PLATELET
Abs Immature Granulocytes: 0.05 10*3/uL (ref 0.00–0.07)
Basophils Absolute: 0.1 10*3/uL (ref 0.0–0.1)
Basophils Relative: 1 %
Eosinophils Absolute: 0.1 10*3/uL (ref 0.0–0.5)
Eosinophils Relative: 1 %
HCT: 36.3 % (ref 36.0–46.0)
Hemoglobin: 11.9 g/dL — ABNORMAL LOW (ref 12.0–15.0)
Immature Granulocytes: 1 %
Lymphocytes Relative: 22 %
Lymphs Abs: 1.5 10*3/uL (ref 0.7–4.0)
MCH: 33.4 pg (ref 26.0–34.0)
MCHC: 32.8 g/dL (ref 30.0–36.0)
MCV: 102 fL — ABNORMAL HIGH (ref 80.0–100.0)
MONO ABS: 0.7 10*3/uL (ref 0.1–1.0)
Monocytes Relative: 10 %
Neutro Abs: 4.6 10*3/uL (ref 1.7–7.7)
Neutrophils Relative %: 65 %
Platelets: 207 10*3/uL (ref 150–400)
RBC: 3.56 MIL/uL — ABNORMAL LOW (ref 3.87–5.11)
RDW: 16.5 % — ABNORMAL HIGH (ref 11.5–15.5)
WBC: 7 10*3/uL (ref 4.0–10.5)
nRBC: 0 % (ref 0.0–0.2)

## 2018-11-21 LAB — COMPREHENSIVE METABOLIC PANEL
ALT: 27 U/L (ref 0–44)
AST: 35 U/L (ref 15–41)
Albumin: 3.8 g/dL (ref 3.5–5.0)
Alkaline Phosphatase: 84 U/L (ref 38–126)
Anion gap: 9 (ref 5–15)
BUN: 13 mg/dL (ref 8–23)
CO2: 24 mmol/L (ref 22–32)
CREATININE: 0.83 mg/dL (ref 0.44–1.00)
Calcium: 9.2 mg/dL (ref 8.9–10.3)
Chloride: 105 mmol/L (ref 98–111)
GFR calc Af Amer: >60 mL/min (ref >60)
GFR calc non Af Amer: >60 mL/min (ref >60)
Glucose, Bld: 156 mg/dL — ABNORMAL HIGH (ref 70–99)
Potassium: 3.7 mmol/L (ref 3.5–5.1)
Sodium: 138 mmol/L (ref 135–145)
Total Bilirubin: 0.5 mg/dL (ref 0.3–1.2)
Total Protein: 6.3 g/dL — ABNORMAL LOW (ref 6.5–8.1)

## 2018-11-21 LAB — PROTEIN, URINE, RANDOM: TOTAL PROTEIN, URINE: NEGATIVE mg/dL

## 2018-11-21 MED ORDER — IRINOTECAN HCL CHEMO INJECTION 100 MG/5ML
125.0000 mg/m2 | Freq: Once | INTRAVENOUS | Status: AC
  Administered 2018-11-21: 240 mg via INTRAVENOUS
  Filled 2018-11-21: qty 10

## 2018-11-21 MED ORDER — SODIUM CHLORIDE 0.9 % IV SOLN
Freq: Once | INTRAVENOUS | Status: AC
  Administered 2018-11-21: 10:00:00 via INTRAVENOUS
  Filled 2018-11-21: qty 250

## 2018-11-21 MED ORDER — LEUCOVORIN CALCIUM INJECTION 350 MG
800.0000 mg | Freq: Once | INTRAVENOUS | Status: AC
  Administered 2018-11-21: 800 mg via INTRAVENOUS
  Filled 2018-11-21: qty 25

## 2018-11-21 MED ORDER — SODIUM CHLORIDE 0.9% FLUSH
10.0000 mL | INTRAVENOUS | Status: DC | PRN
  Administered 2018-11-21: 10 mL
  Filled 2018-11-21: qty 10

## 2018-11-21 MED ORDER — SODIUM CHLORIDE 0.9 % IV SOLN
2400.0000 mg/m2 | INTRAVENOUS | Status: DC
  Administered 2018-11-21: 4800 mg via INTRAVENOUS
  Filled 2018-11-21: qty 96

## 2018-11-21 MED ORDER — PALONOSETRON HCL INJECTION 0.25 MG/5ML
0.2500 mg | Freq: Once | INTRAVENOUS | Status: AC
  Administered 2018-11-21: 0.25 mg via INTRAVENOUS
  Filled 2018-11-21: qty 5

## 2018-11-21 MED ORDER — ATROPINE SULFATE 1 MG/ML IJ SOLN
0.5000 mg | Freq: Once | INTRAMUSCULAR | Status: AC
  Administered 2018-11-21: 0.5 mg via INTRAVENOUS
  Filled 2018-11-21: qty 1

## 2018-11-21 MED ORDER — DEXAMETHASONE SODIUM PHOSPHATE 10 MG/ML IJ SOLN
4.0000 mg | Freq: Once | INTRAMUSCULAR | Status: AC
  Administered 2018-11-21: 4 mg via INTRAVENOUS
  Filled 2018-11-21: qty 1

## 2018-11-21 MED ORDER — SODIUM CHLORIDE 0.9 % IV SOLN
800.0000 mg | Freq: Once | INTRAVENOUS | Status: AC
  Administered 2018-11-21: 800 mg via INTRAVENOUS
  Filled 2018-11-21: qty 32

## 2018-11-21 NOTE — Progress Notes (Signed)
[place] CONSULT NOTE  Patient Care Team: Lin Givens, MD as PCP - General (Family Medicine) Clent Jacks, RN as Registered Nurse  CHIEF COMPLAINTS/PURPOSE OF CONSULTATION:  Metastatic colon cancer  #  Oncology History   #Metastatic colon cancer/liver metastases  # sep 2019 FOLFIRI plus Avastin [Dr.Rao] q 3 W   # paranoid schizophrenia/h/o developmental and mood disorder      Colon adenocarcinoma ([place])   12/22/2017 Initial Diagnosis    Colon adenocarcinoma ([place])    01/10/2018 -  Chemotherapy    The patient had palonosetron (ALOXI) injection 0.25 mg, 0.25 mg, Intravenous,  Once, 18 of 20 cycles Administration: 0.25 mg (01/10/2018), 0.25 mg (02/06/2018), 0.25 mg (02/20/2018), 0.25 mg (03/06/2018), 0.25 mg (03/27/2018), 0.25 mg (05/01/2018), 0.25 mg (05/15/2018), 0.25 mg (06/05/2018), 0.25 mg (06/19/2018), 0.25 mg (07/03/2018), 0.25 mg (07/17/2018), 0.25 mg (07/31/2018), 0.25 mg (08/21/2018), 0.25 mg (09/18/2018), 0.25 mg (10/09/2018), 0.25 mg (10/30/2018) pegfilgrastim (NEULASTA) injection 6 mg, 6 mg, Subcutaneous, Once, 16 of 18 cycles Administration: 6 mg (02/08/2018), 6 mg (02/22/2018), 6 mg (03/08/2018), 6 mg (03/29/2018), 6 mg (05/03/2018), 6 mg (05/17/2018), 6 mg (06/07/2018), 6 mg (06/21/2018), 6 mg (07/05/2018), 6 mg (07/19/2018), 6 mg (08/02/2018), 6 mg (08/23/2018), 6 mg (10/11/2018), 6 mg (11/01/2018) bevacizumab (AVASTIN) 900 mg in sodium chloride 0.9 % 100 mL chemo infusion, 875 mg (100 % of original dose 10 mg/kg), Intravenous,  Once, 11 of 13 cycles Dose modification: 10 mg/kg (original dose 10 mg/kg, Cycle 7) Administration: 900 mg (05/01/2018), 900 mg (05/15/2018), 900 mg (06/19/2018), 900 mg (07/03/2018), 900 mg (07/17/2018), 900 mg (07/31/2018), 800 mg (09/18/2018), 800 mg (10/09/2018), 800 mg (10/30/2018) irinotecan (CAMPTOSAR) 300 mg in dextrose 5 % 500 mL chemo infusion, 150 mg/m2 = 300 mg (100 % of original dose 150 mg/m2), Intravenous,  Once, 6 of 8 cycles Dose modification:  150 mg/m2 (original dose 150 mg/m2, Cycle 13), 125 mg/m2 (original dose 150 mg/m2, Cycle 14, Reason: Dose not tolerated) Administration: 300 mg (07/31/2018), 240 mg (08/21/2018), 240 mg (09/18/2018), 240 mg (10/09/2018), 240 mg (10/30/2018) leucovorin 800 mg in dextrose 5 % 250 mL infusion, 796 mg, Intravenous,  Once, 18 of 20 cycles Administration: 800 mg (01/10/2018), 800 mg (02/06/2018), 800 mg (02/20/2018), 800 mg (03/06/2018), 800 mg (03/27/2018), 800 mg (07/31/2018), 800 mg (08/21/2018), 800 mg (09/18/2018), 800 mg (10/09/2018), 800 mg (10/30/2018) oxaliplatin (ELOXATIN) 170 mg in dextrose 5 % 500 mL chemo infusion, 85 mg/m2 = 170 mg, Intravenous,  Once, 6 of 6 cycles Dose modification: 65 mg/m2 (original dose 85 mg/m2, Cycle 4, Reason: Other (see comments), Comment: thrombocytopenia) Administration: 170 mg (01/10/2018), 170 mg (02/06/2018), 170 mg (02/20/2018), 130 mg (03/06/2018), 130 mg (03/27/2018) fluorouracil (ADRUCIL) chemo injection 800 mg, 400 mg/m2 = 800 mg, Intravenous,  Once, 16 of 18 cycles Administration: 800 mg (01/10/2018), 800 mg (02/06/2018), 800 mg (02/20/2018), 800 mg (03/06/2018), 800 mg (03/27/2018), 800 mg (05/15/2018), 800 mg (06/05/2018), 800 mg (06/19/2018), 800 mg (07/03/2018), 800 mg (07/17/2018), 800 mg (07/31/2018), 800 mg (08/21/2018), 800 mg (09/18/2018), 800 mg (10/09/2018), 800 mg (10/30/2018) fluorouracil (ADRUCIL) 4,800 mg in sodium chloride 0.9 % 54 mL chemo infusion, 2,400 mg/m2 = 4,800 mg, Intravenous, 1 Day/Dose, 18 of 20 cycles Administration: 4,800 mg (01/10/2018), 4,800 mg (02/06/2018), 4,800 mg (02/20/2018), 4,800 mg (03/06/2018), 4,800 mg (03/27/2018), 4,800 mg (05/01/2018), 4,800 mg (05/15/2018), 4,800 mg (06/05/2018), 4,800 mg (06/19/2018), 4,800 mg (07/03/2018), 4,800 mg (07/17/2018), 4,800 mg (07/31/2018), 4,800 mg (08/21/2018), 4,800 mg (09/18/2018), 4,800 mg (10/09/2018), 4,800 mg (10/30/2018)  for  chemotherapy treatment.      Cancer of right colon ([place])     HISTORY OF PRESENTING ILLNESS: poor  historian/ accompanied by care giver.  Alphonsine Pietrzyk 72 y.o.  female with a history of metastatic colon cancer to the liver currently on FOLFIRI plus Avastin is here for follow-up.  As per the caregiver patient is having difficulty with ADLs especially few days after chemotherapy.  Having episodes of diarrhea.  Also having episodes of confusion for the first few days of chemotherapy.   Review of Systems  Unable to perform ROS: Psychiatric disorder     MEDICAL HISTORY:  Past Medical History:  Diagnosis Date  . Colon cancer ([place])   . Hypertension     SURGICAL HISTORY: Past Surgical History:  Procedure Laterality Date  . COLOSTOMY REVISION N/A 12/06/2017   Procedure: COLON RESECTION RIGHT;  Surgeon: Florene Glen, MD;  Location: [facility] ORS;  Service: General;  Laterality: N/A;  . DILATION AND CURETTAGE, DIAGNOSTIC / THERAPEUTIC    . PORTACATH PLACEMENT N/A 01/06/2018   Procedure: INSERTION PORT-A-CATH;  Surgeon: Vickie Epley, MD;  Location: [facility] ORS;  Service: General;  Laterality: N/A;    SOCIAL HISTORY: Social History   Socioeconomic History  . Marital status: Widowed    Spouse name: Not on file  . Number of children: Not on file  . Years of education: Not on file  . Highest education level: Not on file  Occupational History  . Not on file  Social Needs  . Financial resource strain: Patient refused  . Food insecurity:    Worry: Patient refused    Inability: Patient refused  . Transportation needs:    Medical: Patient refused    Non-medical: Patient refused  Tobacco Use  . Smoking status: Former Smoker    Last attempt to quit: 11/23/2007    Years since quitting: 11.0  . Smokeless tobacco: Never Used  Substance and Sexual Activity  . Alcohol use: No  . Drug use: No  . Sexual activity: Never  Lifestyle  . Physical activity:    Days per week: Patient refused    Minutes per session: Patient refused  . Stress: Patient refused  Relationships  . Social  connections:    Talks on phone: Patient refused    Gets together: Patient refused    Attends religious service: Patient refused    Active member of club or organization: Patient refused    Attends meetings of clubs or organizations: Patient refused    Relationship status: Patient refused  . Intimate partner violence:    Fear of current or ex partner: Patient refused    Emotionally abused: Patient refused    Physically abused: Patient refused    Forced sexual activity: Patient refused  Other Topics Concern  . Not on file  Social History Narrative  . Not on file    FAMILY HISTORY: Family History  Problem Relation Age of Onset  . ALS Mother   . ALS Father     ALLERGIES:  has No Known Allergies.  MEDICATIONS:  Current Outpatient Medications  Medication Sig Dispense Refill  . acetaminophen (TYLENOL) 500 MG tablet Take 1,000 mg by mouth every 8 (eight) hours as needed for mild pain.    Marland Kitchen alendronate (FOSAMAX) 70 MG tablet Take 70 mg by mouth once a week.    . ARIPiprazole (ABILIFY) 15 MG tablet Take 7.5 mg by mouth daily.    . benztropine (COGENTIN) 0.5 MG tablet Take 0.5 mg by mouth at  bedtime.    . Calcium 600-200 MG-UNIT tablet Take 1 tablet by mouth daily.    . calcium carbonate (TUMS - DOSED IN MG ELEMENTAL CALCIUM) 500 MG chewable tablet Chew 1 tablet by mouth 2 (two) times daily as needed for indigestion or heartburn.    . lidocaine-prilocaine (EMLA) cream Apply 1 application topically as needed. 1 hour prior to  each chemotherapy treatment. Place small amount over port site and place saran wrap over the cream to protect clothing 30 g 0  . loratadine (CLARITIN) 10 MG tablet Take 1 tablet (10 mg total) by mouth daily as needed for allergies. Take 1 tablet the day before pump removal, 1 tablet the day of pump removal and 1 tablet the day after pump removal. Take as directed with each cycle of  chemotherapy treatment 30 tablet 1  . memantine (NAMENDA XR) 14 MG CP24 24 hr capsule  Take 14 mg by mouth at bedtime.     . metoprolol succinate (TOPROL-XL) 50 MG 24 hr tablet Take 50 mg by mouth every morning. Take with or immediately following a meal.     . Multiple Vitamins-Minerals (SENTRY SENIOR PO) Take 1 tablet by mouth daily.    . ondansetron (ZOFRAN) 8 MG tablet Take 1 tablet (8 mg total) by mouth 2 (two) times daily as needed for nausea or vomiting. Can start using on day 3 after chemo 20 tablet 0  . perphenazine (TRILAFON) 4 MG tablet Take 4 mg by mouth 2 (two) times daily.    . prochlorperazine (COMPAZINE) 10 MG tablet Take 1 tablet (10 mg total) by mouth every 6 (six) hours as needed for nausea or vomiting. 30 tablet 0  . amLODipine (NORVASC) 10 MG tablet Take 10 mg by mouth daily.      No current facility-administered medications for this visit.    Facility-Administered Medications Ordered in Other Visits  Medication Dose Route Frequency Provider Last Rate Last Dose  . dextrose 5 % solution   Intravenous Once Sindy Guadeloupe, MD      . fluorouracil (ADRUCIL) 4,800 mg in sodium chloride 0.9 % 54 mL chemo infusion  2,400 mg/m2 (Treatment Plan Recorded) Intravenous 1 day or 1 dose Charlaine Dalton R, MD      . heparin lock flush 100 unit/mL  500 Units Intravenous Once Sindy Guadeloupe, MD      . heparin lock flush 100 unit/mL  500 Units Intracatheter Once PRN Sindy Guadeloupe, MD      . heparin lock flush 100 unit/mL  500 Units Intravenous Once Sindy Guadeloupe, MD      . irinotecan (CAMPTOSAR) 240 mg in dextrose 5 % 500 mL chemo infusion  125 mg/m2 (Treatment Plan Recorded) Intravenous Once Cammie Sickle, MD 341 mL/hr at 11/21/18 1059 240 mg at 11/21/18 1059  . leucovorin 800 mg in dextrose 5 % 250 mL infusion  800 mg Intravenous Once Cammie Sickle, MD 193 mL/hr at 11/21/18 1101 800 mg at 11/21/18 1101  . sodium chloride flush (NS) 0.9 % injection 10 mL  10 mL Intravenous PRN Sindy Guadeloupe, MD   10 mL at 02/06/18 0845  . sodium chloride flush (NS) 0.9 %  injection 10 mL  10 mL Intracatheter PRN Sindy Guadeloupe, MD      . sodium chloride flush (NS) 0.9 % injection 10 mL  10 mL Intracatheter PRN Sindy Guadeloupe, MD   10 mL at 03/06/18 0900  . sodium chloride flush (NS) 0.9 %  injection 10 mL  10 mL Intravenous PRN Sindy Guadeloupe, MD   10 mL at 03/27/18 0909  . sodium chloride flush (NS) 0.9 % injection 10 mL  10 mL Intracatheter PRN Cammie Sickle, MD   10 mL at 11/21/18 0905      .  PHYSICAL EXAMINATION: ECOG PERFORMANCE STATUS: 1 - Symptomatic but completely ambulatory  Vitals:   11/21/18 0925  BP: (!) 147/72  Pulse: (!) 56  Resp: 20  Temp: (!) 96 F (35.6 C)   Filed Weights   11/21/18 0925  Weight: 171 lb 15.3 oz (78 kg)    Physical Exam  Constitutional: She is oriented to person, place, and time and well-developed, well-nourished, and in no distress.  Accompanied by caregiver.  She is walking herself.  HENT:  Head: Normocephalic and atraumatic.  Mouth/Throat: Oropharynx is clear and moist. No oropharyngeal exudate.  Eyes: Pupils are equal, round, and reactive to light.  Neck: Normal range of motion. Neck supple.  Cardiovascular: Normal rate and regular rhythm.  Pulmonary/Chest: No respiratory distress. She has no wheezes.  Abdominal: Soft. Bowel sounds are normal. She exhibits no distension and no mass. There is no abdominal tenderness. There is no rebound and no guarding.  Musculoskeletal: Normal range of motion.        General: No tenderness or edema.  Neurological: She is alert and oriented to person, place, and time.  Skin: Skin is warm.  Psychiatric: Affect normal.     LABORATORY DATA:  I have reviewed the data as listed Lab Results  Component Value Date   WBC 7.0 11/21/2018   HGB 11.9 (L) 11/21/2018   HCT 36.3 11/21/2018   MCV 102.0 (H) 11/21/2018   PLT 207 11/21/2018   Recent Labs    10/09/18 0900 10/30/18 0844 11/21/18 0919  NA 137 139 138  K 3.9 4.0 3.7  CL 104 107 105  CO2 24 24 24    GLUCOSE 119* 112* 156*  BUN 8 9 13   CREATININE 0.82 0.78 0.83  CALCIUM 9.3 9.3 9.2  GFRNONAA >60 >60 >60  GFRAA >60 >60 >60  PROT 6.7 6.5 6.3*  ALBUMIN 3.9 3.8 3.8  AST 38 30 35  ALT 31 22 27   ALKPHOS 83 84 84  BILITOT 0.5 0.6 0.5    RADIOGRAPHIC STUDIES: I have personally reviewed the radiological images as listed and agreed with the findings in the report. Ct Chest W Contrast  Result Date: 10/24/2018 CLINICAL DATA:  Metastatic colon cancer.  Restaging. EXAM: CT CHEST, ABDOMEN, AND PELVIS WITH CONTRAST TECHNIQUE: Multidetector CT imaging of the chest, abdomen and pelvis was performed following the standard protocol during bolus administration of intravenous contrast. CONTRAST:  113mL ISOVUE-300 IOPAMIDOL (ISOVUE-300) INJECTION 61% COMPARISON:  CT scan 07/27/2018 FINDINGS: CT CHEST FINDINGS Cardiovascular: The heart is normal in size. No pericardial effusion. The aorta is normal in caliber. No dissection. Minimal scattered atherosclerotic calcifications. The branch vessels are patent. No definite coronary artery calcifications. Mediastinum/Nodes: No mediastinal or hilar mass or adenopathy. The esophagus is grossly normal. Lungs/Pleura: Few small scattered subpleural pulmonary nodules are likely benign. There is also a small calcified granuloma noted in the right middle lobe. Minimal linear basilar scarring changes but no acute pulmonary findings or pleural effusion. Musculoskeletal: No breast masses, supraclavicular or axillary adenopathy. The thyroid gland appears normal. Benign-appearing calcification noted in the left lobe. The right-sided Port-A-Cath is stable. No complicating features. No worrisome bone lesions to suggest metastatic disease. CT ABDOMEN PELVIS FINDINGS Hepatobiliary: Subtle  13.5 mm segment 7 liver lesion previously measured 18 mm. No new hepatic lesions. No intrahepatic biliary dilatation. The gallbladder is normal. No common bile duct dilatation. Pancreas: No mass,  inflammation or ductal dilatation. Mild pancreatic atrophy. Spleen: Normal size.  No focal lesions. Adrenals/Urinary Tract: Stable small bilateral adrenal gland nodules. There also numerous bilateral renal cysts. Small left renal calculi are also noted. No worrisome renal lesions. The bladder is slightly distended. No bladder mass or calculi. Stomach/Bowel: The stomach, duodenum, small bowel and colon are grossly normal. Surgical changes are again noted from a prior right hemicolectomy and ileocolonic anastomosis. There is persistent soft tissue thickening near the surgical site which may be postoperative changes. Could not exclude residual tumor. This measures approximately 3.0 x 1.9 cm and previously measured 3.4 x 2.0 cm. Small adjacent mesenteric lymph nodes appears stable. Stable soft tissue nodule involving the small bowel loop in the right upper pelvis on image number 88 measuring 13.5 mm. Stable soft tissue mass involving a small bowel loop in the mid right pelvis on image number 96. This measures 2.4 x 1.7 cm and previously measured 2.6 x 1.9 cm. No new or progressive findings. Vascular/Lymphatic: The aorta and branch vessels are patent. The major venous structures are patent. No retroperitoneal mass or lymphadenopathy. Reproductive: The uterus and ovaries are normal. Other: No pelvic mass or adenopathy. No free pelvic fluid collections. No inguinal mass or adenopathy. Stable right-sided anterior abdominal wall hernia. Musculoskeletal: No significant bony findings. IMPRESSION: 1. No CT findings to suggest pulmonary metastatic disease. 2. Slight interval decrease in size of the right hepatic lobe metastatic lesion. No new lesions or progressive findings. 3. Stable soft tissue lesions involving the colon near the anastomosis and also 2 separate right lower quadrant small bowel loops. No new or progressive findings. Stable small scattered adjacent mesenteric nodes. Electronically Signed   By: Marijo Sanes  M.D.   On: 10/24/2018 11:53   Ct Abdomen Pelvis W Contrast  Result Date: 10/24/2018 CLINICAL DATA:  Metastatic colon cancer.  Restaging. EXAM: CT CHEST, ABDOMEN, AND PELVIS WITH CONTRAST TECHNIQUE: Multidetector CT imaging of the chest, abdomen and pelvis was performed following the standard protocol during bolus administration of intravenous contrast. CONTRAST:  155mL ISOVUE-300 IOPAMIDOL (ISOVUE-300) INJECTION 61% COMPARISON:  CT scan 07/27/2018 FINDINGS: CT CHEST FINDINGS Cardiovascular: The heart is normal in size. No pericardial effusion. The aorta is normal in caliber. No dissection. Minimal scattered atherosclerotic calcifications. The branch vessels are patent. No definite coronary artery calcifications. Mediastinum/Nodes: No mediastinal or hilar mass or adenopathy. The esophagus is grossly normal. Lungs/Pleura: Few small scattered subpleural pulmonary nodules are likely benign. There is also a small calcified granuloma noted in the right middle lobe. Minimal linear basilar scarring changes but no acute pulmonary findings or pleural effusion. Musculoskeletal: No breast masses, supraclavicular or axillary adenopathy. The thyroid gland appears normal. Benign-appearing calcification noted in the left lobe. The right-sided Port-A-Cath is stable. No complicating features. No worrisome bone lesions to suggest metastatic disease. CT ABDOMEN PELVIS FINDINGS Hepatobiliary: Subtle 13.5 mm segment 7 liver lesion previously measured 18 mm. No new hepatic lesions. No intrahepatic biliary dilatation. The gallbladder is normal. No common bile duct dilatation. Pancreas: No mass, inflammation or ductal dilatation. Mild pancreatic atrophy. Spleen: Normal size.  No focal lesions. Adrenals/Urinary Tract: Stable small bilateral adrenal gland nodules. There also numerous bilateral renal cysts. Small left renal calculi are also noted. No worrisome renal lesions. The bladder is slightly distended. No bladder mass or calculi.  Stomach/Bowel: The stomach, duodenum, small bowel and colon are grossly normal. Surgical changes are again noted from a prior right hemicolectomy and ileocolonic anastomosis. There is persistent soft tissue thickening near the surgical site which may be postoperative changes. Could not exclude residual tumor. This measures approximately 3.0 x 1.9 cm and previously measured 3.4 x 2.0 cm. Small adjacent mesenteric lymph nodes appears stable. Stable soft tissue nodule involving the small bowel loop in the right upper pelvis on image number 88 measuring 13.5 mm. Stable soft tissue mass involving a small bowel loop in the mid right pelvis on image number 96. This measures 2.4 x 1.7 cm and previously measured 2.6 x 1.9 cm. No new or progressive findings. Vascular/Lymphatic: The aorta and branch vessels are patent. The major venous structures are patent. No retroperitoneal mass or lymphadenopathy. Reproductive: The uterus and ovaries are normal. Other: No pelvic mass or adenopathy. No free pelvic fluid collections. No inguinal mass or adenopathy. Stable right-sided anterior abdominal wall hernia. Musculoskeletal: No significant bony findings. IMPRESSION: 1. No CT findings to suggest pulmonary metastatic disease. 2. Slight interval decrease in size of the right hepatic lobe metastatic lesion. No new lesions or progressive findings. 3. Stable soft tissue lesions involving the colon near the anastomosis and also 2 separate right lower quadrant small bowel loops. No new or progressive findings. Stable small scattered adjacent mesenteric nodes. Electronically Signed   By: Marijo Sanes M.D.   On: 10/24/2018 11:53    ASSESSMENT & PLAN:   Cancer of right colon [facility]) # Metastatic colon cancer-currently on FOLFIRI plus Avastin; recent CT scan December 2019 improved.  Continue current dose/regimen every 3 weeks with Neulasta support.  #Proceed with FOLFIRI plus Avastin today. Labs today reviewed;  acceptable for treatment  today.   # Diarrhea-grade 1-2 [patient unreliable historian].  Recommend discontinuation of 5-FU bolus.   # delirium/with baseline psychiatric paranoid schizophrenia-recommend continue on the dose of steroids to 4 mg IV Dex.  # DISPOSITION: # chemo today; pump off Okey Regal on jan 2nd # follow up with Dr.Corcoran/labs-cbc/cmp/cea; UA; folfiri+ avastin- Dr.B  All questions were answered. The patient knows to call the clinic with any problems, questions or concerns.   Cammie Sickle, MD 11/21/2018 11:35 AM

## 2018-11-21 NOTE — Assessment & Plan Note (Addendum)
#   Metastatic colon cancer-currently on FOLFIRI plus Avastin; recent CT scan December 2019 improved.  Continue current dose/regimen every 3 weeks with Neulasta support.  #Proceed with FOLFIRI plus Avastin today. Labs today reviewed;  acceptable for treatment today.   # Diarrhea-grade 1-2 [patient unreliable historian].  Recommend discontinuation of 5-FU bolus.   # delirium/with baseline psychiatric paranoid schizophrenia-recommend continue on the dose of steroids to 4 mg IV Dex.  # DISPOSITION: # chemo today; pump off Okey Regal on jan 2nd # follow up with Dr.Corcoran/labs-cbc/cmp/cea; UA; folfiri+ avastin- Dr.B

## 2018-11-23 ENCOUNTER — Inpatient Hospital Stay: Payer: Medicare Other | Attending: Internal Medicine

## 2018-11-23 VITALS — BP 152/81 | HR 60 | Temp 97.2°F | Resp 16

## 2018-11-23 DIAGNOSIS — D701 Agranulocytosis secondary to cancer chemotherapy: Secondary | ICD-10-CM | POA: Insufficient documentation

## 2018-11-23 DIAGNOSIS — Z5111 Encounter for antineoplastic chemotherapy: Secondary | ICD-10-CM | POA: Diagnosis present

## 2018-11-23 DIAGNOSIS — C189 Malignant neoplasm of colon, unspecified: Secondary | ICD-10-CM | POA: Diagnosis present

## 2018-11-23 DIAGNOSIS — C787 Secondary malignant neoplasm of liver and intrahepatic bile duct: Secondary | ICD-10-CM | POA: Insufficient documentation

## 2018-11-23 DIAGNOSIS — Z7689 Persons encountering health services in other specified circumstances: Secondary | ICD-10-CM | POA: Insufficient documentation

## 2018-11-23 MED ORDER — HEPARIN SOD (PORK) LOCK FLUSH 100 UNIT/ML IV SOLN
500.0000 [IU] | Freq: Once | INTRAVENOUS | Status: AC | PRN
  Administered 2018-11-23: 500 [IU]

## 2018-11-23 MED ORDER — PEGFILGRASTIM INJECTION 6 MG/0.6ML ~~LOC~~
6.0000 mg | PREFILLED_SYRINGE | Freq: Once | SUBCUTANEOUS | Status: AC
  Administered 2018-11-23: 6 mg via SUBCUTANEOUS
  Filled 2018-11-23: qty 0.6

## 2018-11-23 MED ORDER — SODIUM CHLORIDE 0.9% FLUSH
10.0000 mL | INTRAVENOUS | Status: DC | PRN
  Administered 2018-11-23: 10 mL
  Filled 2018-11-23: qty 10

## 2018-11-23 NOTE — Patient Instructions (Signed)
Pegfilgrastim injection  What is this medicine?  PEGFILGRASTIM (PEG fil gra stim) is a long-acting granulocyte colony-stimulating factor that stimulates the growth of neutrophils, a type of white blood cell important in the body's fight against infection. It is used to reduce the incidence of fever and infection in patients with certain types of cancer who are receiving chemotherapy that affects the bone marrow, and to increase survival after being exposed to high doses of radiation.  This medicine may be used for other purposes; ask your health care provider or pharmacist if you have questions.  COMMON BRAND NAME(S): Fulphila, Neulasta, UDENYCA  What should I tell my health care provider before I take this medicine?  They need to know if you have any of these conditions:  -kidney disease  -latex allergy  -ongoing radiation therapy  -sickle cell disease  -skin reactions to acrylic adhesives (On-Body Injector only)  -an unusual or allergic reaction to pegfilgrastim, filgrastim, other medicines, foods, dyes, or preservatives  -pregnant or trying to get pregnant  -breast-feeding  How should I use this medicine?  This medicine is for injection under the skin. If you get this medicine at home, you will be taught how to prepare and give the pre-filled syringe or how to use the On-body Injector. Refer to the patient Instructions for Use for detailed instructions. Use exactly as directed. Tell your healthcare provider immediately if you suspect that the On-body Injector may not have performed as intended or if you suspect the use of the On-body Injector resulted in a missed or partial dose.  It is important that you put your used needles and syringes in a special sharps container. Do not put them in a trash can. If you do not have a sharps container, call your pharmacist or healthcare provider to get one.  Talk to your pediatrician regarding the use of this medicine in children. While this drug may be prescribed for  selected conditions, precautions do apply.  Overdosage: If you think you have taken too much of this medicine contact a poison control center or emergency room at once.  NOTE: This medicine is only for you. Do not share this medicine with others.  What if I miss a dose?  It is important not to miss your dose. Call your doctor or health care professional if you miss your dose. If you miss a dose due to an On-body Injector failure or leakage, a new dose should be administered as soon as possible using a single prefilled syringe for manual use.  What may interact with this medicine?  Interactions have not been studied.  Give your health care provider a list of all the medicines, herbs, non-prescription drugs, or dietary supplements you use. Also tell them if you smoke, drink alcohol, or use illegal drugs. Some items may interact with your medicine.  This list may not describe all possible interactions. Give your health care provider a list of all the medicines, herbs, non-prescription drugs, or dietary supplements you use. Also tell them if you smoke, drink alcohol, or use illegal drugs. Some items may interact with your medicine.  What should I watch for while using this medicine?  You may need blood work done while you are taking this medicine.  If you are going to need a MRI, CT scan, or other procedure, tell your doctor that you are using this medicine (On-Body Injector only).  What side effects may I notice from receiving this medicine?  Side effects that you should report to   your doctor or health care professional as soon as possible:  -allergic reactions like skin rash, itching or hives, swelling of the face, lips, or tongue  -back pain  -dizziness  -fever  -pain, redness, or irritation at site where injected  -pinpoint red spots on the skin  -red or dark-brown urine  -shortness of breath or breathing problems  -stomach or side pain, or pain at the shoulder  -swelling  -tiredness  -trouble passing urine or  change in the amount of urine  Side effects that usually do not require medical attention (report to your doctor or health care professional if they continue or are bothersome):  -bone pain  -muscle pain  This list may not describe all possible side effects. Call your doctor for medical advice about side effects. You may report side effects to FDA at 1-800-FDA-1088.  Where should I keep my medicine?  Keep out of the reach of children.  If you are using this medicine at home, you will be instructed on how to store it. Throw away any unused medicine after the expiration date on the label.  NOTE: This sheet is a summary. It may not cover all possible information. If you have questions about this medicine, talk to your doctor, pharmacist, or health care provider.   2019 [employer] (2018-02-13 16:57:08)

## 2018-11-28 ENCOUNTER — Ambulatory Visit: Payer: Medicare Other | Admitting: Internal Medicine

## 2018-11-28 ENCOUNTER — Ambulatory Visit: Payer: Medicare Other

## 2018-11-28 ENCOUNTER — Other Ambulatory Visit: Payer: Medicare Other

## 2018-12-08 ENCOUNTER — Other Ambulatory Visit: Payer: Self-pay

## 2018-12-08 DIAGNOSIS — C189 Malignant neoplasm of colon, unspecified: Secondary | ICD-10-CM

## 2018-12-10 ENCOUNTER — Other Ambulatory Visit: Payer: Self-pay | Admitting: Hematology and Oncology

## 2018-12-10 DIAGNOSIS — Z7189 Other specified counseling: Secondary | ICD-10-CM | POA: Insufficient documentation

## 2018-12-10 DIAGNOSIS — C787 Secondary malignant neoplasm of liver and intrahepatic bile duct: Secondary | ICD-10-CM | POA: Insufficient documentation

## 2018-12-10 DIAGNOSIS — Z5111 Encounter for antineoplastic chemotherapy: Secondary | ICD-10-CM | POA: Insufficient documentation

## 2018-12-10 NOTE — Progress Notes (Addendum)
[facility] day:  12/11/2018  Chief Complaint: Latoya Jones is a 73 y.o. female with stage IV colon cancer who is seen for new patient assessment and cycle #7 FOLFIRI + Avastin.  HPI:  The patient presented to the ER with symptoms of dizziness on standing. She had problems with constipation requiring prune juice. She presented with abdominal pain in ER.  Abdomen and pelvic CT on 12/03/2017 revealed a large cecal mass causing secondary appendicitis.CT chest without contrast revealed tiny 2 mm right upper lobe pulmonary nodule. CEA was 4.8 on 12/04/2017.  She underwent exploratory laparotomy with right hemicolectomy and terminal ileum resection and separate small bowel resection on 12/06/2017 by Dr. Phoebe Perch. Findings revealed a large cecal tumor with extramural spread to retroperitoneum mesentery, mesentery lymph nodes, and parietal peritoneum. There was a dense inflammatory response of the retroperitoneum and small bowel. There was a second portion of small bowel that was involved and resected. A small portion of free tumor was identified in the retroperitoneum.  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 Radial margin was positive for invasive carcinoma.  There were 2 colon adenomas (2.5 and 1.8 cm) of the ascending colon. Metastatic adenocarcinoma was seen in 4 of 13 lymph nodes. There were 2 tumor deposits.  There was a segment of small intestine with abscess which also showed adenocarcinoma invading the distal appendix with perforation. One mesenteric lymph node was negative for malignancy. Retroperitoneal tumor removal was positive for mucinous adenocarcinoma. Proximal and distal margins were negative.  Pathologic stage was [id number]. MSI stable. KRAS mutation was positive  Case discussed at tumor board. PET CT was not recommended as it may represent post surgical changes.   Radiation oncology did not recommend adjuvant RT upon completion of chemotherapy.  She received 5 cycles of FOLFOX from 01/10/2018 - 03/27/2018.  She required Neulasta beginning with cycle #2.  Chest, abdomen, and pelvic CT on 04/20/2018 revealed peritoneal nodularity within the right pericolic gutter which may represent postsurgical change and/or residual disease. There was a soft tissue mass which appeared to be involving the distal small bowel within the right lower quadrant which may represent residual/recurrent disease. There was soft tissue irregularity and fluid within the right adnexa which may represent postsurgical changes or residual/recurrent disease.  She received 6 cycles of 5FU and LV + Avastin (05/01/2018 - 07/17/2018) with a palliative intent.  Oxaliplatin was discontinued secondary to progressive thrombocytopenia requiring treatment interruptions.  Chest, abdomen and pelvic CT on 07/27/2018 revealed interval increase in size of right hepatic lobe lesion concerning hepatic metastatic disease.  There was similar-appearing nodularity within the right pericolic gutter which may represent metastatic disease or postsurgical changes.  There was similar-appearing soft tissue mass within the distal small bowel concerning for residual disease.  There was stable bilateral adrenal nodules.  There was interval development of patchy ground-glass opacities predominantly within the upper lobes bilaterally, favored to be infectious/inflammatory in etiology.   She has received 6 cycles of FOLFIRI + Avastin  (07/31/2018 - 11/21/2018).  5FU bolus was discontinued after cycle #5 (last 10/30/2018).  She was unable to tolerate chemotherapy every 2 weeks and thus has been receiving chemotherapy every 3 weeks.  Chest, abdomen, and pelvic CT on 10/24/2018 revealed no CT findings to suggest pulmonary metastatic disease.  There was slight interval decrease in size of the right hepatic lobe metastatic lesion.   There were no new lesions or progressive  findings.  There was stable soft tissue lesions involving the colon near the anastomosis and also 2 separate right lower quadrant small bowel loops.  There were no new or progressive findings.  There were stable small scattered adjacent mesenteric nodes.  She was last seen by Dr. Janese Banks on 10/30/2018.  She noted chronic fatigue. Her cognitive abilities were slower over the last couple of months. She denied any falls. Caregiver reported her thinking was sometimes clouded and she needs to me told what to do for first few days after chemotherapy.  Thinking then would slowly get better.  She had 1 episode of diarrhea  She was seen by Dr. Rogue Bussing on 11/21/2018.  Patient noted to be a poor historian.  At that time, she was noted to have confusion and difficulties with ADLs for a few days after chemotherapy. She had episodes of diarrhea.  5FU bolus was discontinued.  She received FOLFIRI + Avastin.  She received Neulasta at disconnect on 11/23/2018.  Today, patient notes that she is "feeling rather faint" with everything that has gone on this morning (nursing care). Patient states, "I feel like I have been a bad girl. I didn't get organized like I should have".  Symptomatically, she denies that she has experienced any B symptoms. She denies any interval infections. She denies any concerning symptoms.   Caregiver notes issues with patient's vision. Patient was reported to have "flushed her glasses down the toilet" x 3 week ago. Patient wearing old glasses. She has an appointment with her eye doctor in February to get new glasses.   Caregiver expresses concerns related to a new medication. Patient recently taken off benztropine (Cogentin) and started on valbenazine (Ingrezza) for tardive dyskinesia. Patient has been "sad" since the medication has been changed. While her "mouth tic" has resolved, caregiver notes that patient is "talking a lot about death" and being a "bad  girl". Affect has been increasingly flat.   Patient experiencing new bowel and bladder incontinence. Patient sits in her recliner most of the day, which is new for her. Patient has new areas of skin breakdown associated with the incontinence.   Patient advises that she maintains an adequate appetite. She is eating well. Weight today is 172 lb 11.7 oz (78.4 kg), which compared to her last visit to the clinic, represents a 4 pound decrease.   Patient denies pain in the clinic today.   Past Medical History:  Diagnosis Date  . Colon cancer ([place])   . Hypertension     Past Surgical History:  Procedure Laterality Date  . COLOSTOMY REVISION N/A 12/06/2017   Procedure: COLON RESECTION RIGHT;  Surgeon: Florene Glen, MD;  Location: [facility] ORS;  Service: General;  Laterality: N/A;  . DILATION AND CURETTAGE, DIAGNOSTIC / THERAPEUTIC    . PORTACATH PLACEMENT N/A 01/06/2018   Procedure: INSERTION PORT-A-CATH;  Surgeon: Vickie Epley, MD;  Location: [facility] ORS;  Service: General;  Laterality: N/A;    Family History  Problem Relation Age of Onset  . ALS Mother   . ALS Father     Social History:  reports that she quit smoking about 11 years ago. She has never used smokeless tobacco. She reports that she does not drink alcohol or use drugs.  She lives in a family care home.  She has her own room.  The patient is accompanied by family care home representative (Mrs. Owens Shark)  today.  Allergies: No Known Allergies  Current Medications: Current Outpatient Medications  Medication Sig Dispense Refill  .  acetaminophen (TYLENOL) 500 MG tablet Take 1,000 mg by mouth every 8 (eight) hours as needed for mild pain.    Marland Kitchen alendronate (FOSAMAX) 70 MG tablet Take 70 mg by mouth once a week.    Marland Kitchen amLODipine (NORVASC) 10 MG tablet Take 10 mg by mouth daily.     . ARIPiprazole (ABILIFY) 15 MG tablet Take 7.5 mg by mouth daily.    . calcium carbonate (TUMS - DOSED IN MG ELEMENTAL CALCIUM) 500 MG chewable tablet  Chew 1 tablet by mouth 2 (two) times daily as needed for indigestion or heartburn.    . Calcium Carbonate-Vitamin D3 (CALCIUM 600-D) 600-400 MG-UNIT TABS Take 1 tablet by mouth daily.    Marland Kitchen lidocaine-prilocaine (EMLA) cream Apply 1 application topically as needed. 1 hour prior to  each chemotherapy treatment. Place small amount over port site and place saran wrap over the cream to protect clothing 30 g 0  . loratadine (CLARITIN) 10 MG tablet Take 1 tablet (10 mg total) by mouth daily as needed for allergies. Take 1 tablet the day before pump removal, 1 tablet the day of pump removal and 1 tablet the day after pump removal. Take as directed with each cycle of  chemotherapy treatment 30 tablet 1  . memantine (NAMENDA XR) 14 MG CP24 24 hr capsule Take 14 mg by mouth at bedtime.     . metoprolol succinate (TOPROL-XL) 50 MG 24 hr tablet Take 50 mg by mouth every morning. Take with or immediately following a meal.     . Multiple Vitamins-Minerals (SENTRY SENIOR PO) Take 1 tablet by mouth daily.    . ondansetron (ZOFRAN) 8 MG tablet Take 1 tablet (8 mg total) by mouth 2 (two) times daily as needed for nausea or vomiting. Can start using on day 3 after chemo 20 tablet 0  . perphenazine (TRILAFON) 4 MG tablet Take 4 mg by mouth 2 (two) times daily.    . prochlorperazine (COMPAZINE) 10 MG tablet Take 1 tablet (10 mg total) by mouth every 6 (six) hours as needed for nausea or vomiting. 30 tablet 0  . Valbenazine Tosylate (INGREZZA) 40 MG CAPS Take 1 capsule by mouth daily.     No current facility-administered medications for this visit.    Facility-Administered Medications Ordered in Other Visits  Medication Dose Route Frequency Provider Last Rate Last Dose  . dextrose 5 % solution   Intravenous Once Sindy Guadeloupe, MD      . heparin lock flush 100 unit/mL  500 Units Intravenous Once Sindy Guadeloupe, MD      . heparin lock flush 100 unit/mL  500 Units Intracatheter Once PRN Sindy Guadeloupe, MD      . heparin  lock flush 100 unit/mL  500 Units Intravenous Once Sindy Guadeloupe, MD      . heparin lock flush 100 unit/mL  500 Units Intravenous Once Corcoran, Melissa C, MD      . sodium chloride flush (NS) 0.9 % injection 10 mL  10 mL Intravenous PRN Sindy Guadeloupe, MD   10 mL at 02/06/18 0845  . sodium chloride flush (NS) 0.9 % injection 10 mL  10 mL Intracatheter PRN Sindy Guadeloupe, MD      . sodium chloride flush (NS) 0.9 % injection 10 mL  10 mL Intracatheter PRN Sindy Guadeloupe, MD   10 mL at 03/06/18 0900  . sodium chloride flush (NS) 0.9 % injection 10 mL  10 mL Intravenous PRN Sindy Guadeloupe,  MD   10 mL at 03/27/18 0909  . sodium chloride flush (NS) 0.9 % injection 10 mL  10 mL Intravenous PRN Lequita Asal, MD        Review of Systems:  GENERAL:  Feels "faint" as "so many things going on".  No fevers, sweats.  Weight loss of 4 pounds. PERFORMANCE STATUS (ECOG):  2 HEENT:  Decreased vision as "flushed glasses down toilet".  No runny nose, sore throat, mouth sores or tenderness. Lungs: No shortness of breath or cough.  No hemoptysis. Cardiac:  No chest pain, palpitations, orthopnea, or PND. GI:  Constipation.  Choked if "crams too much in her mouth".  No nausea, vomiting, diarrhea, melena or hematochezia. GU:  Bowel and bladder issues resulting in scheduled times to use the bathroom.  No urgency, frequency, dysuria, or hematuria. Musculoskeletal:  No back pain.  No joint pain.  No muscle tenderness. Extremities:  No pain or swelling. Skin:  Skin breakdown on bottom.  No rashes. Neuro:  Mouth tic has resolved.  No headache, numbness or weakness, balance or coordination issues. Endocrine:  No diabetes, thyroid issues, hot flashes or night sweats. Psych:  "Sad" since medication change.  Feels like she has been a "bad girl".  Notes "not being organized".   Pain:  No focal pain. Review of systems:  All other systems reviewed and found to be negative.  Physical Exam: Blood pressure 131/76, pulse  (!) 51, temperature 97.7 F (36.5 C), temperature source Tympanic, resp. rate 16, weight 172 lb 11.7 oz (78.4 kg). GENERAL:  Well developed, well nourished, woman sitting comfortably on a donut in the exam room in no acute distress. MENTAL STATUS:  Alert and oriented to person, place and time. HEAD:  Wearing a hat. Gray hair.  Normocephalic, atraumatic, face symmetric, no Cushingoid features. EYES:  Glasses.  Blue eyes.  Pupils equal round and reactive to light and accomodation.  No conjunctivitis or scleral icterus. ENT:  Oropharynx clear without lesion.  Tongue normal.  Edentulous.  Mucous membranes moist.  RESPIRATORY:  Clear to auscultation without rales, wheezes or rhonchi. CARDIOVASCULAR:  Bradycardia.  Regular rate and rhythm without murmur, rub or gallop. CHEST WALL:  Right sided port-a-cath. ABDOMEN:  Soft, non-tender, with active bowel sounds, and no hepatosplenomegaly.  No masses. SKIN:  Gluteal fold stage I decubitus ulcer.  No rashes, ulcers or lesions. EXTREMITIES:  Chronic lower extremity changes.  No skin discoloration or tenderness.  No palpable cords. LYMPH NODES: No palpable cervical, supraclavicular, axillary or inguinal adenopathy  NEUROLOGICAL: Unremarkable. PSYCH:  Appropriate.  Flat affect.   Imaging studies: 12/03/2017:  Abdomen and pelvic CT revealed a large cecal mass causing secondary appendicitis. 12/03/2017:  CT chest without contrast revealed tiny 2 mm right upper lobe pulmonary nodule.  04/20/2018:  Chest, abdomen, and pelvic CT revealed peritoneal nodularity within the right pericolic gutter which may represent postsurgical change and/or residual disease. There was a soft tissue mass which appeared to be involving the distal small bowel within the right lower quadrant which may represent residual/recurrent disease. There was soft tissue irregularity and fluid within the right adnexa which may represent postsurgical changes or residual/recurrent  disease. 07/27/2018:  Chest, abdomen and pelvic CT revealed interval increase in size of right hepatic lobe lesion concerning hepatic metastatic disease.  There was similar-appearing nodularity within the right pericolic gutter which may represent metastatic disease or postsurgical changes.  There was similar-appearing soft tissue mass within the distal small bowel concerning for residual disease.  There was stable bilateral adrenal nodules.  There was interval development of patchy ground-glass opacities predominantly within the upper lobes bilaterally, favored to be infectious/inflammatory in etiology.  10/24/2018:  Chest, abdomen, and pelvic CT revealed no pulmonary metastatic disease.  There was slight interval decrease in size of the right hepatic lobe metastatic lesion.  There were no new lesions or progressive findings.  There was stable soft tissue lesions involving the colon near the anastomosis and also 2 separate right lower quadrant small bowel loops.  There were no new or progressive findings.  There were stable small scattered adjacent mesenteric nodes.   Infusion on 12/11/2018  Component Date Value Ref Range Status  . Total Protein, Urine 12/11/2018 NEGATIVE  mg/dL Final   Performed at [facility], [address] [place], [place] [address]  . Magnesium 12/11/2018 2.0  1.7 - 2.4 mg/dL Final   Performed at [facility], [address] [place], [place] [address]  . Sodium 12/11/2018 137  135 - 145 mmol/L Final  . Potassium 12/11/2018 4.0  3.5 - 5.1 mmol/L Final  . Chloride 12/11/2018 105  98 - 111 mmol/L Final  . CO2 12/11/2018 24  22 - 32 mmol/L Final  . Glucose, Bld 12/11/2018 135* 70 - 99 mg/dL Final  . BUN 12/11/2018 9  8 - 23 mg/dL Final  . Creatinine, Ser 12/11/2018 0.78  0.44 - 1.00 mg/dL Final  . Calcium 12/11/2018 9.0  8.9 - 10.3 mg/dL Final  . Total Protein 12/11/2018 6.3* 6.5 - 8.1 g/dL Final  . Albumin 12/11/2018 3.7  3.5 - 5.0 g/dL Final  .  AST 12/11/2018 26  15 - 41 U/L Final  . ALT 12/11/2018 20  0 - 44 U/L Final  . Alkaline Phosphatase 12/11/2018 72  38 - 126 U/L Final  . Total Bilirubin 12/11/2018 0.6  0.3 - 1.2 mg/dL Final  . GFR calc non Af Amer 12/11/2018 >60  >60 mL/min Final  . GFR calc Af Amer 12/11/2018 >60  >60 mL/min Final  . Anion gap 12/11/2018 8  5 - 15 Final   Performed at [facility], [address] [place], [place] [address]  . WBC 12/11/2018 5.8  4.0 - 10.5 K/uL Final  . RBC 12/11/2018 3.53* 3.87 - 5.11 MIL/uL Final  . Hemoglobin 12/11/2018 12.1  12.0 - 15.0 g/dL Final  . HCT 12/11/2018 37.1  36.0 - 46.0 % Final  . MCV 12/11/2018 105.1* 80.0 - 100.0 fL Final  . MCH 12/11/2018 34.3* 26.0 - 34.0 pg Final  . MCHC 12/11/2018 32.6  30.0 - 36.0 g/dL Final  . RDW 12/11/2018 17.1* 11.5 - 15.5 % Final  . Platelets 12/11/2018 182  150 - 400 K/uL Final  . nRBC 12/11/2018 0.0  0.0 - 0.2 % Final  . Neutrophils Relative % 12/11/2018 67  % Final  . Neutro Abs 12/11/2018 3.9  1.7 - 7.7 K/uL Final  . Lymphocytes Relative 12/11/2018 21  % Final  . Lymphs Abs 12/11/2018 1.2  0.7 - 4.0 K/uL Final  . Monocytes Relative 12/11/2018 9  % Final  . Monocytes Absolute 12/11/2018 0.5  0.1 - 1.0 K/uL Final  . Eosinophils Relative 12/11/2018 2  % Final  . Eosinophils Absolute 12/11/2018 0.1  0.0 - 0.5 K/uL Final  . Basophils Relative 12/11/2018 1  % Final  . Basophils Absolute 12/11/2018 0.1  0.0 - 0.1 K/uL Final  . Immature Granulocytes 12/11/2018 0  % Final  . Abs  Immature Granulocytes 12/11/2018 0.02  0.00 - 0.07 K/uL Final   Performed at [facility], [address], [place], [place] [address]    Assessment:  Josaphine Shimamoto is a 73 y.o. female with stage IV colon cancer with peritoneal metastasis and possible liver metastasis.  She is s/p exploratory laparotomy with right hemicolectomy and terminal ileum resection and separate small bowel resection on 12/06/2017. Pathology revealed grade II  mucinous adenocarcinoma of the cecum with invasion of the appendiceal serosa. Radial margin was positive for invasive carcinoma.  There were 2 colon adenomas (2.5 and 1.8 cm) of the ascending colon. Metastatic adenocarcinoma was seen in 4 of 13 lymph nodes. There were 2 tumor deposits.  There was a segment of small intestine with abscess which also showed adenocarcinoma invading the distal appendix with perforation. One mesenteric lymph node was negative for malignancy. Retroperitoneal tumor removal was positive for mucinous adenocarcinoma. Proximal and distal margins were negative.  Pathologic stage was pT4bpN2a. MSI stable. KRAS mutation was positive  She received 5 cycles of FOLFOX (01/10/2018 - 03/27/2018).  She began Neulasta with cycle #2.  Oxaliplatin was decreased from 85 mg/m2 to 65 mg/m2 with cycle #4.  She received 6 cycles of 5FU + LV with Avastin (05/01/2018 - 07/17/2018).  She is s/p 6 cycles of FOLIFIRI + Avastin (07/31/2018 - 11/21/2018).  She receives chemotherapy every 3 weeks secondary to tolerance.  Irinotecan was decreased from 150 mg/m2 to 125 mg/m2 with cycle #2.  5FU bolus was discontinued with cycle #6.  Chest, abdomen, and pelvic CT on 10/24/2018 revealed no pulmonary metastatic disease.  There was slight interval decrease in size of the right hepatic lobe metastatic lesion. There were no new lesions or progressive findings.  There was stable soft tissue lesions involving the colon near the anastomosis and also 2 separate right lower quadrant small bowel loops.  There was no new or progressive findings.  There were stable small scattered adjacent mesenteric nodes.  CEA has been followed: 4.8 on 12/04/2017, 3.1 on 01/10/2018, 4.2 on 03/06/2018, 3.8 on 05/01/2018, 9.2 on 07/31/2018, 5.9 on 09/18/2018, and 5.0 on 10/30/2018.  She has macrocytic RBC indices.  She has a history of a low B12 level.  B12 was 266 on 01/30/2018.  Folate was 32 on 01/30/2018.  TSH was 0.913 on  12/03/2017.  She has paranoid schizophrenia with delusions.  She lives in a group home.  Symptomatically, she has had a change in affect with recent psychiatric medication changes.  Exam reveals no adenopathy or hepatosplenomegaly.  She has a well healing stage I gluteal fold decubitus ulcer.  Plan: 1.   Labs today:  CBC with diff, CMP, Mg, urine protein. 2.   Stage IV colon cancer  Review entire medical history.  Decadron premed dose decreased from 10 mg to 4 mg per tolerance.  She receives chemotherapy every 3 weeks.  She receives Neulasta at disconnect.  Cycle #7 FOLFIRI + Avastin today.  RTC on 12/13/2018 for pump disconnect and Neulasta.  3.  Macrocytic RBC indices  Patient has a history of low B12 on 01/30/2018.  Check B12, folate, and TSH at next blood draw. 4.  Family home representative to speak with psychiatry about recent medication changes. 5.  RTC in 3 weeks for MD assessment, labs (CBC with diff, CMP, Mg, B12, folate, TSH, urine protein), and cycle #8 FOLFIRI + Avastin.    Honor Loh, NP  12/11/2018, 9:52 AM   I saw and evaluated the patient, participating  in the key portions of the service and reviewing pertinent diagnostic studies and records.  I reviewed the nurse practitioner's note and agree with the findings and the plan.  The assessment and plan were discussed with the patient.  Multiple questions were asked by the patient and caregiver and answered.   Nolon Stalls, MD 12/11/2018,9:52 AM

## 2018-12-11 ENCOUNTER — Inpatient Hospital Stay: Payer: Medicare Other

## 2018-12-11 ENCOUNTER — Inpatient Hospital Stay (HOSPITAL_BASED_OUTPATIENT_CLINIC_OR_DEPARTMENT_OTHER): Payer: Medicare Other | Admitting: Hematology and Oncology

## 2018-12-11 ENCOUNTER — Encounter: Payer: Self-pay | Admitting: Hematology and Oncology

## 2018-12-11 VITALS — BP 131/76 | HR 51 | Temp 97.7°F | Resp 16 | Wt 172.7 lb

## 2018-12-11 DIAGNOSIS — C189 Malignant neoplasm of colon, unspecified: Secondary | ICD-10-CM

## 2018-12-11 DIAGNOSIS — Z7189 Other specified counseling: Secondary | ICD-10-CM

## 2018-12-11 DIAGNOSIS — D7589 Other specified diseases of blood and blood-forming organs: Secondary | ICD-10-CM | POA: Diagnosis not present

## 2018-12-11 DIAGNOSIS — Z5111 Encounter for antineoplastic chemotherapy: Secondary | ICD-10-CM | POA: Diagnosis not present

## 2018-12-11 DIAGNOSIS — E538 Deficiency of other specified B group vitamins: Secondary | ICD-10-CM

## 2018-12-11 DIAGNOSIS — C787 Secondary malignant neoplasm of liver and intrahepatic bile duct: Secondary | ICD-10-CM | POA: Diagnosis not present

## 2018-12-11 DIAGNOSIS — Z79899 Other long term (current) drug therapy: Secondary | ICD-10-CM

## 2018-12-11 DIAGNOSIS — I10 Essential (primary) hypertension: Secondary | ICD-10-CM

## 2018-12-11 DIAGNOSIS — Z87891 Personal history of nicotine dependence: Secondary | ICD-10-CM

## 2018-12-11 LAB — COMPREHENSIVE METABOLIC PANEL
ALT: 20 U/L (ref 0–44)
AST: 26 U/L (ref 15–41)
Albumin: 3.7 g/dL (ref 3.5–5.0)
Alkaline Phosphatase: 72 U/L (ref 38–126)
Anion gap: 8 (ref 5–15)
BUN: 9 mg/dL (ref 8–23)
CO2: 24 mmol/L (ref 22–32)
Calcium: 9 mg/dL (ref 8.9–10.3)
Chloride: 105 mmol/L (ref 98–111)
Creatinine, Ser: 0.78 mg/dL (ref 0.44–1.00)
GFR calc Af Amer: >60 mL/min (ref >60)
GFR calc non Af Amer: >60 mL/min (ref >60)
Glucose, Bld: 135 mg/dL — ABNORMAL HIGH (ref 70–99)
Potassium: 4 mmol/L (ref 3.5–5.1)
Sodium: 137 mmol/L (ref 135–145)
Total Bilirubin: 0.6 mg/dL (ref 0.3–1.2)
Total Protein: 6.3 g/dL — ABNORMAL LOW (ref 6.5–8.1)

## 2018-12-11 LAB — MAGNESIUM: Magnesium: 2 mg/dL (ref 1.7–2.4)

## 2018-12-11 LAB — CBC WITH DIFFERENTIAL/PLATELET
Abs Immature Granulocytes: 0.02 10*3/uL (ref 0.00–0.07)
Basophils Absolute: 0.1 10*3/uL (ref 0.0–0.1)
Basophils Relative: 1 %
Eosinophils Absolute: 0.1 10*3/uL (ref 0.0–0.5)
Eosinophils Relative: 2 %
HCT: 37.1 % (ref 36.0–46.0)
Hemoglobin: 12.1 g/dL (ref 12.0–15.0)
Immature Granulocytes: 0 %
Lymphocytes Relative: 21 %
Lymphs Abs: 1.2 10*3/uL (ref 0.7–4.0)
MCH: 34.3 pg — ABNORMAL HIGH (ref 26.0–34.0)
MCHC: 32.6 g/dL (ref 30.0–36.0)
MCV: 105.1 fL — ABNORMAL HIGH (ref 80.0–100.0)
Monocytes Absolute: 0.5 10*3/uL (ref 0.1–1.0)
Monocytes Relative: 9 %
Neutro Abs: 3.9 10*3/uL (ref 1.7–7.7)
Neutrophils Relative %: 67 %
Platelets: 182 10*3/uL (ref 150–400)
RBC: 3.53 MIL/uL — ABNORMAL LOW (ref 3.87–5.11)
RDW: 17.1 % — ABNORMAL HIGH (ref 11.5–15.5)
WBC: 5.8 10*3/uL (ref 4.0–10.5)
nRBC: 0 % (ref 0.0–0.2)

## 2018-12-11 LAB — PROTEIN, URINE, RANDOM: Total Protein, Urine: NEGATIVE mg/dL

## 2018-12-11 MED ORDER — SODIUM CHLORIDE 0.9% FLUSH
10.0000 mL | INTRAVENOUS | Status: DC | PRN
  Filled 2018-12-11: qty 10

## 2018-12-11 MED ORDER — ATROPINE SULFATE 1 MG/ML IJ SOLN
0.5000 mg | Freq: Once | INTRAMUSCULAR | Status: AC
  Administered 2018-12-11: 0.5 mg via INTRAVENOUS
  Filled 2018-12-11: qty 1

## 2018-12-11 MED ORDER — HEPARIN SOD (PORK) LOCK FLUSH 100 UNIT/ML IV SOLN: 500.0000 [IU] | Freq: Once | INTRAVENOUS | Status: DC

## 2018-12-11 MED ORDER — DEXAMETHASONE SODIUM PHOSPHATE 10 MG/ML IJ SOLN
4.0000 mg | Freq: Once | INTRAMUSCULAR | Status: AC
  Administered 2018-12-11: 4 mg via INTRAVENOUS
  Filled 2018-12-11: qty 1

## 2018-12-11 MED ORDER — LEUCOVORIN CALCIUM INJECTION 350 MG
800.0000 mg | Freq: Once | INTRAVENOUS | Status: AC
  Administered 2018-12-11: 800 mg via INTRAVENOUS
  Filled 2018-12-11: qty 40

## 2018-12-11 MED ORDER — PALONOSETRON HCL INJECTION 0.25 MG/5ML
0.2500 mg | Freq: Once | INTRAVENOUS | Status: AC
  Administered 2018-12-11: 0.25 mg via INTRAVENOUS
  Filled 2018-12-11: qty 5

## 2018-12-11 MED ORDER — SODIUM CHLORIDE 0.9 % IV SOLN
2400.0000 mg/m2 | INTRAVENOUS | Status: DC
  Administered 2018-12-11: 4800 mg via INTRAVENOUS
  Filled 2018-12-11: qty 96

## 2018-12-11 MED ORDER — IRINOTECAN HCL CHEMO INJECTION 100 MG/5ML
125.0000 mg/m2 | Freq: Once | INTRAVENOUS | Status: AC
  Administered 2018-12-11: 240 mg via INTRAVENOUS
  Filled 2018-12-11: qty 10

## 2018-12-11 MED ORDER — SODIUM CHLORIDE 0.9 % IV SOLN
800.0000 mg | Freq: Once | INTRAVENOUS | Status: AC
  Administered 2018-12-11: 800 mg via INTRAVENOUS
  Filled 2018-12-11: qty 32

## 2018-12-11 MED ORDER — DEXTROSE 5 % IV SOLN
Freq: Once | INTRAVENOUS | Status: AC
  Administered 2018-12-11: 10:00:00 via INTRAVENOUS
  Filled 2018-12-11: qty 250

## 2018-12-11 NOTE — Patient Instructions (Signed)
Fluorouracil, 5-FU injection What is this medicine? FLUOROURACIL, 5-FU (flure oh YOOR a sil) is a chemotherapy drug. It slows the growth of cancer cells. This medicine is used to treat many types of cancer like breast cancer, colon or rectal cancer, pancreatic cancer, and stomach cancer. This medicine may be used for other purposes; ask your health care provider or pharmacist if you have questions. COMMON BRAND NAME(S): Adrucil What should I tell my health care provider before I take this medicine? They need to know if you have any of these conditions: -blood disorders -dihydropyrimidine dehydrogenase (DPD) deficiency -infection (especially a virus infection such as chickenpox, cold sores, or herpes) -kidney disease -liver disease -malnourished, poor nutrition -recent or ongoing radiation therapy -an unusual or allergic reaction to fluorouracil, other chemotherapy, other medicines, foods, dyes, or preservatives -pregnant or trying to get pregnant -breast-feeding How should I use this medicine? This drug is given as an infusion or injection into a vein. It is administered in a hospital or clinic by a specially trained health care professional. Talk to your pediatrician regarding the use of this medicine in children. Special care may be needed. Overdosage: If you think you have taken too much of this medicine contact a poison control center or emergency room at once. NOTE: This medicine is only for you. Do not share this medicine with others. What if I miss a dose? It is important not to miss your dose. Call your doctor or health care professional if you are unable to keep an appointment. What may interact with this medicine? -allopurinol -cimetidine -dapsone -digoxin -hydroxyurea -leucovorin -levamisole -medicines for seizures like ethotoin, fosphenytoin, phenytoin -medicines to increase blood counts like filgrastim, pegfilgrastim, sargramostim -medicines that treat or prevent blood  clots like warfarin, enoxaparin, and dalteparin -methotrexate -metronidazole -pyrimethamine -some other chemotherapy drugs like busulfan, cisplatin, estramustine, vinblastine -trimethoprim -trimetrexate -vaccines Talk to your doctor or health care professional before taking any of these medicines: -acetaminophen -aspirin -ibuprofen -ketoprofen -naproxen This list may not describe all possible interactions. Give your health care provider a list of all the medicines, herbs, non-prescription drugs, or dietary supplements you use. Also tell them if you smoke, drink alcohol, or use illegal drugs. Some items may interact with your medicine. What should I watch for while using this medicine? Visit your doctor for checks on your progress. This drug may make you feel generally unwell. This is not uncommon, as chemotherapy can affect healthy cells as well as cancer cells. Report any side effects. Continue your course of treatment even though you feel ill unless your doctor tells you to stop. In some cases, you may be given additional medicines to help with side effects. Follow all directions for their use. Call your doctor or health care professional for advice if you get a fever, chills or sore throat, or other symptoms of a cold or flu. Do not treat yourself. This drug decreases your body's ability to fight infections. Try to avoid being around people who are sick. This medicine may increase your risk to bruise or bleed. Call your doctor or health care professional if you notice any unusual bleeding. Be careful brushing and flossing your teeth or using a toothpick because you may get an infection or bleed more easily. If you have any dental work done, tell your dentist you are receiving this medicine. Avoid taking products that contain aspirin, acetaminophen, ibuprofen, naproxen, or ketoprofen unless instructed by your doctor. These medicines may hide a fever. Do not become pregnant while taking this  medicine. Women should inform their doctor if they wish to become pregnant or think they might be pregnant. There is a potential for serious side effects to an unborn child. Talk to your health care professional or pharmacist for more information. Do not breast-feed an infant while taking this medicine. Men should inform their doctor if they wish to father a child. This medicine may lower sperm counts. Do not treat diarrhea with over the counter products. Contact your doctor if you have diarrhea that lasts more than 2 days or if it is severe and watery. This medicine can make you more sensitive to the sun. Keep out of the sun. If you cannot avoid being in the sun, wear protective clothing and use sunscreen. Do not use sun lamps or tanning beds/booths. What side effects may I notice from receiving this medicine? Side effects that you should report to your doctor or health care professional as soon as possible: -allergic reactions like skin rash, itching or hives, swelling of the face, lips, or tongue -low blood counts - this medicine may decrease the number of white blood cells, red blood cells and platelets. You may be at increased risk for infections and bleeding. -signs of infection - fever or chills, cough, sore throat, pain or difficulty passing urine -signs of decreased platelets or bleeding - bruising, pinpoint red spots on the skin, black, tarry stools, blood in the urine -signs of decreased red blood cells - unusually weak or tired, fainting spells, lightheadedness -breathing problems -changes in vision -chest pain -mouth sores -nausea and vomiting -pain, swelling, redness at site where injected -pain, tingling, numbness in the hands or feet -redness, swelling, or sores on hands or feet -stomach pain -unusual bleeding Side effects that usually do not require medical attention (report to your doctor or health care professional if they continue or are bothersome): -changes in finger or  toe nails -diarrhea -dry or itchy skin -hair loss -headache -loss of appetite -sensitivity of eyes to the light -stomach upset -unusually teary eyes This list may not describe all possible side effects. Call your doctor for medical advice about side effects. You may report side effects to FDA at 1-800-FDA-1088. Where should I keep my medicine? This drug is given in a hospital or clinic and will not be stored at home. NOTE: This sheet is a summary. It may not cover all possible information. If you have questions about this medicine, talk to your doctor, pharmacist, or health care provider.  2019 [employer] Standard (2008-03-13 13:53:16) Leucovorin injection What is this medicine? LEUCOVORIN (loo koe VOR in) is used to prevent or treat the harmful effects of some medicines. This medicine is used to treat anemia caused by a low amount of folic acid in the body. It is also used with 5-fluorouracil (5-FU) to treat colon cancer. This medicine may be used for other purposes; ask your health care provider or pharmacist if you have questions. What should I tell my health care provider before I take this medicine? They need to know if you have any of these conditions: -anemia from low levels of vitamin B-12 in the blood -an unusual or allergic reaction to leucovorin, folic acid, other medicines, foods, dyes, or preservatives -pregnant or trying to get pregnant -breast-feeding How should I use this medicine? This medicine is for injection into a muscle or into a vein. It is given by a health care professional in a hospital or clinic setting. Talk to your pediatrician regarding the use of this medicine in   children. Special care may be needed. Overdosage: If you think you have taken too much of this medicine contact a poison control center or emergency room at once. NOTE: This medicine is only for you. Do not share this medicine with others. What if I miss a dose? This does not apply. What may  interact with this medicine? -capecitabine -fluorouracil -phenobarbital -phenytoin -primidone -trimethoprim-sulfamethoxazole This list may not describe all possible interactions. Give your health care provider a list of all the medicines, herbs, non-prescription drugs, or dietary supplements you use. Also tell them if you smoke, drink alcohol, or use illegal drugs. Some items may interact with your medicine. What should I watch for while using this medicine? Your condition will be monitored carefully while you are receiving this medicine. This medicine may increase the side effects of 5-fluorouracil, 5-FU. Tell your doctor or health care professional if you have diarrhea or mouth sores that do not get better or that get worse. What side effects may I notice from receiving this medicine? Side effects that you should report to your doctor or health care professional as soon as possible: -allergic reactions like skin rash, itching or hives, swelling of the face, lips, or tongue -breathing problems -fever, infection -mouth sores -unusual bleeding or bruising -unusually weak or tired Side effects that usually do not require medical attention (report to your doctor or health care professional if they continue or are bothersome): -constipation or diarrhea -loss of appetite -nausea, vomiting This list may not describe all possible side effects. Call your doctor for medical advice about side effects. You may report side effects to FDA at 1-800-FDA-1088. Where should I keep my medicine? This drug is given in a hospital or clinic and will not be stored at home. NOTE: This sheet is a summary. It may not cover all possible information. If you have questions about this medicine, talk to your doctor, pharmacist, or health care provider.  2019 [employer] Standard (2008-05-14 16:50:29) Irinotecan injection What is this medicine? IRINOTECAN (ir in oh TEE kan ) is a chemotherapy drug. It is used to  treat colon and rectal cancer. This medicine may be used for other purposes; ask your health care provider or pharmacist if you have questions. COMMON BRAND NAME(S): Camptosar What should I tell my health care provider before I take this medicine? They need to know if you have any of these conditions: -dehydration -diarrhea -infection (especially a virus infection such as chickenpox, cold sores, or herpes) -liver disease -low blood counts, like low white cell, platelet, or red cell counts -low levels of calcium, magnesium, or potassium in the blood -recent or ongoing radiation therapy -an unusual or allergic reaction to irinotecan, other medicines, foods, dyes, or preservatives -pregnant or trying to get pregnant -breast-feeding How should I use this medicine? This drug is given as an infusion into a vein. It is administered in a hospital or clinic by a specially trained health care professional. Talk to your pediatrician regarding the use of this medicine in children. Special care may be needed. Overdosage: If you think you have taken too much of this medicine contact a poison control center or emergency room at once. NOTE: This medicine is only for you. Do not share this medicine with others. What if I miss a dose? It is important not to miss your dose. Call your doctor or health care professional if you are unable to keep an appointment. What may interact with this medicine? This medicine may interact with the following medications: -  antiviral medicines for HIV or AIDS -certain antibiotics like rifampin or rifabutin -certain medicines for fungal infections like itraconazole, ketoconazole, posaconazole, and voriconazole -certain medicines for seizures like carbamazepine, phenobarbital, phenotoin -clarithromycin -gemfibrozil -nefazodone -St. John's Wort This list may not describe all possible interactions. Give your health care provider a list of all the medicines, herbs,  non-prescription drugs, or dietary supplements you use. Also tell them if you smoke, drink alcohol, or use illegal drugs. Some items may interact with your medicine. What should I watch for while using this medicine? Your condition will be monitored carefully while you are receiving this medicine. You will need important blood work done while you are taking this medicine. This drug may make you feel generally unwell. This is not uncommon, as chemotherapy can affect healthy cells as well as cancer cells. Report any side effects. Continue your course of treatment even though you feel ill unless your doctor tells you to stop. In some cases, you may be given additional medicines to help with side effects. Follow all directions for their use. You may get drowsy or dizzy. Do not drive, use machinery, or do anything that needs mental alertness until you know how this medicine affects you. Do not stand or sit up quickly, especially if you are an older patient. This reduces the risk of dizzy or fainting spells. Call your doctor or health care professional for advice if you get a fever, chills or sore throat, or other symptoms of a cold or flu. Do not treat yourself. This drug decreases your body's ability to fight infections. Try to avoid being around people who are sick. This medicine may increase your risk to bruise or bleed. Call your doctor or health care professional if you notice any unusual bleeding. Be careful brushing and flossing your teeth or using a toothpick because you may get an infection or bleed more easily. If you have any dental work done, tell your dentist you are receiving this medicine. Avoid taking products that contain aspirin, acetaminophen, ibuprofen, naproxen, or ketoprofen unless instructed by your doctor. These medicines may hide a fever. Do not become pregnant while taking this medicine. Women should inform their doctor if they wish to become pregnant or think they might be pregnant.  There is a potential for serious side effects to an unborn child. Talk to your health care professional or pharmacist for more information. Do not breast-feed an infant while taking this medicine. What side effects may I notice from receiving this medicine? Side effects that you should report to your doctor or health care professional as soon as possible: -allergic reactions like skin rash, itching or hives, swelling of the face, lips, or tongue -chest pain -diarrhea -flushing, runny nose, sweating during infusion -low blood counts - this medicine may decrease the number of white blood cells, red blood cells and platelets. You may be at increased risk for infections and bleeding. -nausea, vomiting -pain, swelling, warmth in the leg -signs of decreased platelets or bleeding - bruising, pinpoint red spots on the skin, black, tarry stools, blood in the urine -signs of infection - fever or chills, cough, sore throat, pain or difficulty passing urine -signs of decreased red blood cells - unusually weak or tired, fainting spells, lightheadedness Side effects that usually do not require medical attention (report to your doctor or health care professional if they continue or are bothersome): -constipation -hair loss -headache -loss of appetite -mouth sores -stomach pain This list may not describe all possible side effects. Call  your doctor for medical advice about side effects. You may report side effects to FDA at 1-800-FDA-1088. Where should I keep my medicine? This drug is given in a hospital or clinic and will not be stored at home. NOTE: This sheet is a summary. It may not cover all possible information. If you have questions about this medicine, talk to your doctor, pharmacist, or health care provider.  2019 [employer] Standard (2018-01-24 15:02:58)

## 2018-12-11 NOTE — Progress Notes (Signed)
Pt here for follow up. Pt reports hopelessness and SI. States "I wish it would be over with". Caregiver reports this has been increased since starting Ingrezza 40 MG Daily on 11/29/2018. Pt seems more down than usual today. Caregiver also reports she has been more aggressive. Denies any other complaints at this time.

## 2018-12-13 ENCOUNTER — Inpatient Hospital Stay: Payer: Medicare Other

## 2018-12-13 VITALS — BP 123/74 | HR 61 | Temp 96.5°F | Resp 18

## 2018-12-13 DIAGNOSIS — C189 Malignant neoplasm of colon, unspecified: Secondary | ICD-10-CM

## 2018-12-13 DIAGNOSIS — Z5111 Encounter for antineoplastic chemotherapy: Secondary | ICD-10-CM | POA: Diagnosis not present

## 2018-12-13 MED ORDER — PEGFILGRASTIM INJECTION 6 MG/0.6ML ~~LOC~~
6.0000 mg | PREFILLED_SYRINGE | Freq: Once | SUBCUTANEOUS | Status: AC
  Administered 2018-12-13: 6 mg via SUBCUTANEOUS

## 2018-12-13 MED ORDER — SODIUM CHLORIDE 0.9% FLUSH
10.0000 mL | INTRAVENOUS | Status: DC | PRN
  Administered 2018-12-13: 10 mL
  Filled 2018-12-13: qty 10

## 2018-12-13 MED ORDER — HEPARIN SOD (PORK) LOCK FLUSH 100 UNIT/ML IV SOLN
500.0000 [IU] | Freq: Once | INTRAVENOUS | Status: AC | PRN
  Administered 2018-12-13: 500 [IU]
  Filled 2018-12-13: qty 5

## 2018-12-31 DIAGNOSIS — D7589 Other specified diseases of blood and blood-forming organs: Secondary | ICD-10-CM | POA: Insufficient documentation

## 2018-12-31 DIAGNOSIS — Z5112 Encounter for antineoplastic immunotherapy: Secondary | ICD-10-CM | POA: Insufficient documentation

## 2018-12-31 DIAGNOSIS — E538 Deficiency of other specified B group vitamins: Secondary | ICD-10-CM | POA: Insufficient documentation

## 2018-12-31 NOTE — Progress Notes (Signed)
[facility] day:  01/01/2019  Chief Complaint: Latoya Jones is a 73 y.o. female with stage IV colon cancer who is seen for assessment prior to cycle #8 FOLFIRI + Avastin.  HPI:  The patient was last seen in the medical oncology clinic on 12/11/2018.  At that time, she was noted to have a change in affect with recent psychiatric medication changes.  Exam revealed no adenopathy or hepatosplenomegaly.  She had a well healing stage I gluteal fold decubitus ulcer.  She received cycle #7 FOLFIRI + Avastin.  She received Neulasta at disconnect.  During the interim, patient has been seen by her psychiatrist and had psychotropic medication discontinued. Depression has improved. Spirit has been "much brighter". Caregiver has not appreciated any further tics with the recent medication changes. Caregiver states, "she is doing better. She is happier. She is not talking about dying all of the time". Caregiver notes that patient is more "agitated" and "forgetful" in the afternoon hours. Questioning sundowning related to dementia.   Patient is doing well today. She does not make note of any acute or concerning symptoms. She feels generally well. Patient denies that she has experienced any B symptoms. She denies any interval infections.  Patient wearing her old glasses today after flushing her "good pair" down the toilet. She is scheduled to see eye doctor towards "then end of the month" to get new glasses. Areas of skin breakdown to buttocks has improved. Patient continues to sit on a donut pillow. Caregiver notes that skin has improved with the change in her incontinence products.  Patient advises that she maintains an adequate appetite. She is eating well. Weight today is 169 lb 12.1 oz (77 kg), which compared to her last visit to the clinic, represents a 3 pound decrease.   Patient denies pain in the clinic today.   Past Medical History:  Diagnosis Date  . Colon  cancer ([place])   . Hypertension     Past Surgical History:  Procedure Laterality Date  . COLOSTOMY REVISION N/A 12/06/2017   Procedure: COLON RESECTION RIGHT;  Surgeon: Florene Glen, MD;  Location: [facility] ORS;  Service: General;  Laterality: N/A;  . DILATION AND CURETTAGE, DIAGNOSTIC / THERAPEUTIC    . PORTACATH PLACEMENT N/A 01/06/2018   Procedure: INSERTION PORT-A-CATH;  Surgeon: Vickie Epley, MD;  Location: [facility] ORS;  Service: General;  Laterality: N/A;    Family History  Problem Relation Age of Onset  . ALS Mother   . ALS Father     Social History:  reports that she quit smoking about 11 years ago. She has never used smokeless tobacco. She reports that she does not drink alcohol or use drugs.  She lives in a family care home.  She has her own room.  The patient is accompanied by family care home representative (Mrs. Owens Shark)  today.  Allergies: No Known Allergies  Current Medications: Current Outpatient Medications  Medication Sig Dispense Refill  . acetaminophen (TYLENOL) 500 MG tablet Take 1,000 mg by mouth every 8 (eight) hours as needed for mild pain.    Marland Kitchen alendronate (FOSAMAX) 70 MG tablet Take 70 mg by mouth once a week.    Marland Kitchen amLODipine (NORVASC) 10 MG tablet Take 10 mg by mouth daily.     . ARIPiprazole (ABILIFY) 15 MG tablet Take 7.5 mg by mouth daily.    . calcium carbonate (TUMS - DOSED IN MG ELEMENTAL CALCIUM) 500 MG chewable tablet Chew 1 tablet  by mouth 2 (two) times daily as needed for indigestion or heartburn.    . Calcium Carbonate-Vitamin D3 (CALCIUM 600-D) 600-400 MG-UNIT TABS Take 1 tablet by mouth daily.    Marland Kitchen lidocaine-prilocaine (EMLA) cream Apply 1 application topically as needed. 1 hour prior to  each chemotherapy treatment. Place small amount over port site and place saran wrap over the cream to protect clothing 30 g 0  . loratadine (CLARITIN) 10 MG tablet Take 1 tablet (10 mg total) by mouth daily as needed for allergies. Take 1 tablet the day before  pump removal, 1 tablet the day of pump removal and 1 tablet the day after pump removal. Take as directed with each cycle of  chemotherapy treatment 30 tablet 1  . memantine (NAMENDA XR) 14 MG CP24 24 hr capsule Take 14 mg by mouth at bedtime.     . metoprolol succinate (TOPROL-XL) 50 MG 24 hr tablet Take 50 mg by mouth every morning. Take with or immediately following a meal.     . Multiple Vitamins-Minerals (SENTRY SENIOR PO) Take 1 tablet by mouth daily.    . ondansetron (ZOFRAN) 8 MG tablet Take 1 tablet (8 mg total) by mouth 2 (two) times daily as needed for nausea or vomiting. Can start using on day 3 after chemo 20 tablet 0  . perphenazine (TRILAFON) 4 MG tablet Take 4 mg by mouth 2 (two) times daily.    . prochlorperazine (COMPAZINE) 10 MG tablet Take 1 tablet (10 mg total) by mouth every 6 (six) hours as needed for nausea or vomiting. 30 tablet 0   No current facility-administered medications for this visit.    Facility-Administered Medications Ordered in Other Visits  Medication Dose Route Frequency Provider Last Rate Last Dose  . dextrose 5 % solution   Intravenous Once Sindy Guadeloupe, MD      . heparin lock flush 100 unit/mL  500 Units Intravenous Once Sindy Guadeloupe, MD      . heparin lock flush 100 unit/mL  500 Units Intracatheter Once PRN Sindy Guadeloupe, MD      . heparin lock flush 100 unit/mL  500 Units Intravenous Once Sindy Guadeloupe, MD      . sodium chloride flush (NS) 0.9 % injection 10 mL  10 mL Intravenous PRN Sindy Guadeloupe, MD   10 mL at 02/06/18 0845  . sodium chloride flush (NS) 0.9 % injection 10 mL  10 mL Intracatheter PRN Sindy Guadeloupe, MD      . sodium chloride flush (NS) 0.9 % injection 10 mL  10 mL Intracatheter PRN Sindy Guadeloupe, MD   10 mL at 03/06/18 0900  . sodium chloride flush (NS) 0.9 % injection 10 mL  10 mL Intravenous PRN Sindy Guadeloupe, MD   10 mL at 03/27/18 0909  . sodium chloride flush (NS) 0.9 % injection 10 mL  10 mL Intravenous PRN Lequita Asal, MD        Review of Systems:  GENERAL:  Feels "ok".  No fevers, sweats.  Weight down 3 pounds. PERFORMANCE STATUS (ECOG):  2 HEENT:  Wearing old glasses as "flushed glasses down toilet".  No visual changes, runny nose, sore throat, mouth sores or tenderness. Lungs: No shortness of breath or cough.  No hemoptysis. Cardiac:  No chest pain, palpitations, orthopnea, or PND. GI:  Bowel and bladder issues resulting in scheduled times to use the bathroom.  No nausea, vomiting, diarrhea, constipation, melena or hematochezia. GU:  No urgency, frequency, dysuria, or hematuria. Musculoskeletal:  No back pain.  No joint pain.  No muscle tenderness. Extremities:  No pain or swelling. Skin:  Skin breakdown on bottom, improving.  No rashes or skin changes. Neuro:  No tic off medication.  No headache, numbness or weakness, balance or coordination issues. Endocrine:  No diabetes, thyroid issues, hot flashes or night sweats. Psych:  Less depression.  In good spirits.  Forgetful in afternoon. Pain:  No focal pain. Review of systems:  All other systems reviewed and found to be negative.   Physical Exam: Blood pressure (!) 155/80, pulse (!) 47, temperature 97.9 F (36.6 C), temperature source Oral, resp. rate 16, weight 169 lb 12.1 oz (77 kg), SpO2 100 %. GENERAL:  Well developed, well nourished, woman sitting comfortably in the exam room in no acute distress. MENTAL STATUS:  Alert and oriented to person, place and time. HEAD:  Wearing a black hat. Gray hair.  Normocephalic, atraumatic, face symmetric, no Cushingoid features. EYES:  Glasses.  Blue eyes.  Pupils equal round and reactive to light and accomodation.  No conjunctivitis or scleral icterus. ENT:  Oropharynx clear without lesion.  Tongue normal.  Edentulous.  Mucous membranes moist.  RESPIRATORY:  Clear to auscultation without rales, wheezes or rhonchi. CARDIOVASCULAR:  Bradycardia.  Regular rate and rhythm without murmur, rub or  gallop. ABDOMEN:  Soft, non-tender, with active bowel sounds, and no hepatosplenomegaly.  No masses. SKIN:  Gluteal fold stage I decubitus, healing.  No rashes, ulcers or lesions. EXTREMITIES:  Chronic lower extremity changes.  No skin discoloration or tenderness.  No palpable cords. LYMPH NODES: No palpable cervical, supraclavicular, axillary or inguinal adenopathy  NEUROLOGICAL: Unremarkable. PSYCH:  Appropriate. Somewhat flat affect.   Imaging studies: 12/03/2017:  Abdomen and pelvic CT revealed a large cecal mass causing secondary appendicitis. 12/03/2017:  CT chest without contrast revealed tiny 2 mm right upper lobe pulmonary nodule.  04/20/2018:  Chest, abdomen, and pelvic CT revealed peritoneal nodularity within the right pericolic gutter which may represent postsurgical change and/or residual disease. There was a soft tissue mass which appeared to be involving the distal small bowel within the right lower quadrant which may represent residual/recurrent disease. There was soft tissue irregularity and fluid within the right adnexa which may represent postsurgical changes or residual/recurrent disease. 07/27/2018:  Chest, abdomen and pelvic CT revealed interval increase in size of right hepatic lobe lesion concerning hepatic metastatic disease.  There was similar-appearing nodularity within the right pericolic gutter which may represent metastatic disease or postsurgical changes.  There was similar-appearing soft tissue mass within the distal small bowel concerning for residual disease.  There was stable bilateral adrenal nodules.  There was interval development of patchy ground-glass opacities predominantly within the upper lobes bilaterally, favored to be infectious/inflammatory in etiology.  10/24/2018:  Chest, abdomen, and pelvic CT revealed no pulmonary metastatic disease.  There was slight interval decrease in size of the right hepatic lobe metastatic lesion.  There were no new lesions  or progressive findings.  There was stable soft tissue lesions involving the colon near the anastomosis and also 2 separate right lower quadrant small bowel loops.  There were no new or progressive findings.  There were stable small scattered adjacent mesenteric nodes.   Infusion on 01/01/2019  Component Date Value Ref Range Status  . Magnesium 01/01/2019 2.1  1.7 - 2.4 mg/dL Final   Performed at [facility], [address], [place], [place] [address]  . Sodium 01/01/2019  137  135 - 145 mmol/L Final  . Potassium 01/01/2019 4.2  3.5 - 5.1 mmol/L Final  . Chloride 01/01/2019 104  98 - 111 mmol/L Final  . CO2 01/01/2019 26  22 - 32 mmol/L Final  . Glucose, Bld 01/01/2019 135* 70 - 99 mg/dL Final  . BUN 01/01/2019 11  8 - 23 mg/dL Final  . Creatinine, Ser 01/01/2019 0.78  0.44 - 1.00 mg/dL Final  . Calcium 01/01/2019 9.0  8.9 - 10.3 mg/dL Final  . Total Protein 01/01/2019 6.3* 6.5 - 8.1 g/dL Final  . Albumin 01/01/2019 3.7  3.5 - 5.0 g/dL Final  . AST 01/01/2019 22  15 - 41 U/L Final  . ALT 01/01/2019 17  0 - 44 U/L Final  . Alkaline Phosphatase 01/01/2019 77  38 - 126 U/L Final  . Total Bilirubin 01/01/2019 0.6  0.3 - 1.2 mg/dL Final  . GFR calc non Af Amer 01/01/2019 >60  >60 mL/min Final  . GFR calc Af Amer 01/01/2019 >60  >60 mL/min Final  . Anion gap 01/01/2019 7  5 - 15 Final   Performed at [facility], [address] [place], [place] [address]  . WBC 01/01/2019 7.0  4.0 - 10.5 K/uL Final  . RBC 01/01/2019 3.37* 3.87 - 5.11 MIL/uL Final  . Hemoglobin 01/01/2019 11.4* 12.0 - 15.0 g/dL Final  . HCT 01/01/2019 34.7* 36.0 - 46.0 % Final  . MCV 01/01/2019 103.0* 80.0 - 100.0 fL Final  . MCH 01/01/2019 33.8  26.0 - 34.0 pg Final  . MCHC 01/01/2019 32.9  30.0 - 36.0 g/dL Final  . RDW 01/01/2019 16.6* 11.5 - 15.5 % Final  . Platelets 01/01/2019 202  150 - 400 K/uL Final  . nRBC 01/01/2019 0.0  0.0 - 0.2 % Final  . Neutrophils Relative % 01/01/2019 64  % Final   . Neutro Abs 01/01/2019 4.5  1.7 - 7.7 K/uL Final  . Lymphocytes Relative 01/01/2019 24  % Final  . Lymphs Abs 01/01/2019 1.7  0.7 - 4.0 K/uL Final  . Monocytes Relative 01/01/2019 9  % Final  . Monocytes Absolute 01/01/2019 0.7  0.1 - 1.0 K/uL Final  . Eosinophils Relative 01/01/2019 1  % Final  . Eosinophils Absolute 01/01/2019 0.1  0.0 - 0.5 K/uL Final  . Basophils Relative 01/01/2019 1  % Final  . Basophils Absolute 01/01/2019 0.1  0.0 - 0.1 K/uL Final  . Immature Granulocytes 01/01/2019 1  % Final  . Abs Immature Granulocytes 01/01/2019 0.04  0.00 - 0.07 K/uL Final   Performed at [facility], [address] [place], [place] [address]    Assessment:  Towana Stenglein is a 73 y.o. female with stage IV colon cancer with peritoneal metastasis and possible liver metastasis.  She is s/p exploratory laparotomy with right hemicolectomy and terminal ileum resection and separate small bowel resection on 12/06/2017. 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 Radial margin was positive for invasive carcinoma.  There were 2 colon adenomas (2.5 and 1.8 cm) of the ascending colon. Metastatic adenocarcinoma was seen in 4 of 13 lymph nodes. There were 2 tumor deposits.  There was a segment of small intestine with abscess which also showed adenocarcinoma invading the distal appendix with perforation. One mesenteric lymph node was negative for malignancy. Retroperitoneal tumor removal was positive for mucinous adenocarcinoma. Proximal and distal margins were negative.  Pathologic stage was [id number]. MSI stable. KRAS mutation was  positive  She received 5 cycles of FOLFOX (01/10/2018 - 03/27/2018).  She began Neulasta with cycle #2.  Oxaliplatin was decreased from 85 mg/m2 to 65 mg/m2 with cycle #4.  She received 6 cycles of 5FU + LV with Avastin (05/01/2018 - 07/17/2018).  She is s/p 7 cycles of FOLIFIRI + Avastin (07/31/2018 -  12/11/2018).  She receives chemotherapy every 3 weeks secondary to tolerance.  Irinotecan was decreased from 150 mg/m2 to 125 mg/m2 with cycle #2.  5FU bolus was discontinued with cycle #6.  Chest, abdomen, and pelvic CT on 10/24/2018 revealed no pulmonary metastatic disease.  There was slight interval decrease in size of the right hepatic lobe metastatic lesion. There were no new lesions or progressive findings.  There was stable soft tissue lesions involving the colon near the anastomosis and also 2 separate right lower quadrant small bowel loops.  There was no new or progressive findings.  There were stable small scattered adjacent mesenteric nodes.  CEA has been followed: 4.8 on 12/04/2017, 3.1 on 01/10/2018, 4.2 on 03/06/2018, 3.8 on 05/01/2018, 9.2 on 07/31/2018, 5.9 on 09/18/2018, and 5.0 on 10/30/2018.  She has macrocytic RBC indices.  She has a history of a low B12 level.  B12 was 266 on 01/30/2018.  Folate was 32 on 01/30/2018.  TSH was 0.913 on 12/03/2017.  She has paranoid schizophrenia with delusions.  She lives in a group home.  Symptomatically, she is doing well.  She denies any GI complaints.  Affect is brighter.  Exam is stable.  Plan: 1.   Labs today:  CBC with diff, CMP, Mg, B12, folate, TSH, urine protein. 2.   Stage IV colon cancer  Clinically doing well.  She received chemotherapy every 3 weeks.  She received Neulasta at disconnect.  She received Decadron 4 mg (instead af 10 mg) as a premed.  Cycle #8 FOLFIRI + Avastin today.  RTC on 01/03/2019 for pump disconnect and Neulasta.   Discuss symptom management. She has antiemetics and pain medications at home to use on a prn bases.  Interventions are adequate.   3.   Macrocytic RBC indices  Patient has a history of low B12 on 01/30/2018.  Check B12, folate, and TSH today.  Start oral B12. 4.   Bradycardia  Contact Dr Humphrey Rolls- patient has appointment today at 3 pm. 5.   RTC in 3 weeks for MD assessment, labs (CBC with diff,  CMP, Mg, B12, urine protein), and cycle #9 FOLFIRI + Avastin.   Honor Loh, NP  01/01/2019, 9:44 AM   I saw and evaluated the patient, participating in the key portions of the service and reviewing pertinent diagnostic studies and records.  I reviewed the nurse practitioner's note and agree with the findings and the plan.  The assessment and plan were discussed with the patient.  Multiple questions were asked by the patient and caregiver and answered.   Nolon Stalls, MD 01/01/2019,9:44 AM

## 2019-01-01 ENCOUNTER — Inpatient Hospital Stay: Payer: Medicare Other

## 2019-01-01 ENCOUNTER — Telehealth: Payer: Self-pay

## 2019-01-01 ENCOUNTER — Inpatient Hospital Stay: Payer: Medicare Other | Attending: Hematology and Oncology | Admitting: Hematology and Oncology

## 2019-01-01 ENCOUNTER — Encounter: Payer: Self-pay | Admitting: Hematology and Oncology

## 2019-01-01 VITALS — BP 155/80 | HR 47 | Temp 97.9°F | Resp 16 | Wt 169.8 lb

## 2019-01-01 VITALS — BP 180/79 | HR 61 | Temp 97.8°F | Resp 17

## 2019-01-01 DIAGNOSIS — D7589 Other specified diseases of blood and blood-forming organs: Secondary | ICD-10-CM

## 2019-01-01 DIAGNOSIS — C189 Malignant neoplasm of colon, unspecified: Secondary | ICD-10-CM

## 2019-01-01 DIAGNOSIS — C787 Secondary malignant neoplasm of liver and intrahepatic bile duct: Secondary | ICD-10-CM | POA: Insufficient documentation

## 2019-01-01 DIAGNOSIS — C786 Secondary malignant neoplasm of retroperitoneum and peritoneum: Secondary | ICD-10-CM | POA: Diagnosis not present

## 2019-01-01 DIAGNOSIS — E538 Deficiency of other specified B group vitamins: Secondary | ICD-10-CM

## 2019-01-01 DIAGNOSIS — Z5112 Encounter for antineoplastic immunotherapy: Secondary | ICD-10-CM

## 2019-01-01 DIAGNOSIS — C180 Malignant neoplasm of cecum: Secondary | ICD-10-CM

## 2019-01-01 DIAGNOSIS — C181 Malignant neoplasm of appendix: Secondary | ICD-10-CM | POA: Diagnosis not present

## 2019-01-01 DIAGNOSIS — Z79899 Other long term (current) drug therapy: Secondary | ICD-10-CM | POA: Diagnosis not present

## 2019-01-01 DIAGNOSIS — Z7689 Persons encountering health services in other specified circumstances: Secondary | ICD-10-CM | POA: Insufficient documentation

## 2019-01-01 DIAGNOSIS — F05 Delirium due to known physiological condition: Secondary | ICD-10-CM | POA: Diagnosis not present

## 2019-01-01 DIAGNOSIS — Z5111 Encounter for antineoplastic chemotherapy: Secondary | ICD-10-CM | POA: Diagnosis present

## 2019-01-01 DIAGNOSIS — F200 Paranoid schizophrenia: Secondary | ICD-10-CM | POA: Insufficient documentation

## 2019-01-01 LAB — CBC WITH DIFFERENTIAL/PLATELET
Abs Immature Granulocytes: 0.04 10*3/uL (ref 0.00–0.07)
Basophils Absolute: 0.1 10*3/uL (ref 0.0–0.1)
Basophils Relative: 1 %
Eosinophils Absolute: 0.1 10*3/uL (ref 0.0–0.5)
Eosinophils Relative: 1 %
HCT: 34.7 % — ABNORMAL LOW (ref 36.0–46.0)
Hemoglobin: 11.4 g/dL — ABNORMAL LOW (ref 12.0–15.0)
Immature Granulocytes: 1 %
Lymphocytes Relative: 24 %
Lymphs Abs: 1.7 10*3/uL (ref 0.7–4.0)
MCH: 33.8 pg (ref 26.0–34.0)
MCHC: 32.9 g/dL (ref 30.0–36.0)
MCV: 103 fL — ABNORMAL HIGH (ref 80.0–100.0)
Monocytes Absolute: 0.7 10*3/uL (ref 0.1–1.0)
Monocytes Relative: 9 %
Neutro Abs: 4.5 10*3/uL (ref 1.7–7.7)
Neutrophils Relative %: 64 %
Platelets: 202 10*3/uL (ref 150–400)
RBC: 3.37 MIL/uL — ABNORMAL LOW (ref 3.87–5.11)
RDW: 16.6 % — ABNORMAL HIGH (ref 11.5–15.5)
WBC: 7 10*3/uL (ref 4.0–10.5)
nRBC: 0 % (ref 0.0–0.2)

## 2019-01-01 LAB — COMPREHENSIVE METABOLIC PANEL
ALT: 17 U/L (ref 0–44)
AST: 22 U/L (ref 15–41)
Albumin: 3.7 g/dL (ref 3.5–5.0)
Alkaline Phosphatase: 77 U/L (ref 38–126)
Anion gap: 7 (ref 5–15)
BUN: 11 mg/dL (ref 8–23)
CO2: 26 mmol/L (ref 22–32)
Calcium: 9 mg/dL (ref 8.9–10.3)
Chloride: 104 mmol/L (ref 98–111)
Creatinine, Ser: 0.78 mg/dL (ref 0.44–1.00)
GFR calc Af Amer: >60 mL/min (ref >60)
GFR calc non Af Amer: >60 mL/min (ref >60)
Glucose, Bld: 135 mg/dL — ABNORMAL HIGH (ref 70–99)
Potassium: 4.2 mmol/L (ref 3.5–5.1)
Sodium: 137 mmol/L (ref 135–145)
Total Bilirubin: 0.6 mg/dL (ref 0.3–1.2)
Total Protein: 6.3 g/dL — ABNORMAL LOW (ref 6.5–8.1)

## 2019-01-01 LAB — MAGNESIUM: Magnesium: 2.1 mg/dL (ref 1.7–2.4)

## 2019-01-01 LAB — PROTEIN, URINE, RANDOM: Total Protein, Urine: NEGATIVE mg/dL

## 2019-01-01 LAB — FOLATE: Folate: 56.4 ng/mL (ref >5.9)

## 2019-01-01 LAB — VITAMIN B12: Vitamin B-12: 2766 pg/mL — ABNORMAL HIGH (ref 180–914)

## 2019-01-01 LAB — TSH: TSH: 1.423 u[IU]/mL (ref 0.350–4.500)

## 2019-01-01 MED ORDER — PALONOSETRON HCL INJECTION 0.25 MG/5ML
0.2500 mg | Freq: Once | INTRAVENOUS | Status: AC
  Administered 2019-01-01: 0.25 mg via INTRAVENOUS
  Filled 2019-01-01: qty 5

## 2019-01-01 MED ORDER — LEUCOVORIN CALCIUM INJECTION 350 MG
800.0000 mg | Freq: Once | INTRAVENOUS | Status: AC
  Administered 2019-01-01: 800 mg via INTRAVENOUS
  Filled 2019-01-01: qty 25

## 2019-01-01 MED ORDER — SODIUM CHLORIDE 0.9 % IV SOLN
Freq: Once | INTRAVENOUS | Status: AC
  Administered 2019-01-01: 10:00:00 via INTRAVENOUS
  Filled 2019-01-01: qty 250

## 2019-01-01 MED ORDER — IRINOTECAN HCL CHEMO INJECTION 100 MG/5ML
125.0000 mg/m2 | Freq: Once | INTRAVENOUS | Status: AC
  Administered 2019-01-01: 240 mg via INTRAVENOUS
  Filled 2019-01-01: qty 10

## 2019-01-01 MED ORDER — ATROPINE SULFATE 1 MG/ML IJ SOLN
0.5000 mg | Freq: Once | INTRAMUSCULAR | Status: AC
  Administered 2019-01-01: 0.5 mg via INTRAVENOUS
  Filled 2019-01-01: qty 1

## 2019-01-01 MED ORDER — DEXAMETHASONE SODIUM PHOSPHATE 10 MG/ML IJ SOLN
4.0000 mg | Freq: Once | INTRAMUSCULAR | Status: AC
  Administered 2019-01-01: 4 mg via INTRAVENOUS
  Filled 2019-01-01: qty 1

## 2019-01-01 MED ORDER — SODIUM CHLORIDE 0.9 % IV SOLN
800.0000 mg | Freq: Once | INTRAVENOUS | Status: AC
  Administered 2019-01-01: 800 mg via INTRAVENOUS
  Filled 2019-01-01: qty 32

## 2019-01-01 MED ORDER — SODIUM CHLORIDE 0.9 % IV SOLN
2400.0000 mg/m2 | INTRAVENOUS | Status: AC
  Administered 2019-01-01: 4800 mg via INTRAVENOUS
  Filled 2019-01-01: qty 96

## 2019-01-01 MED ORDER — SODIUM CHLORIDE 0.9% FLUSH
10.0000 mL | INTRAVENOUS | Status: DC | PRN
  Administered 2019-01-01: 10 mL via INTRAVENOUS
  Filled 2019-01-01: qty 10

## 2019-01-01 NOTE — Telephone Encounter (Signed)
Spoke with [facility] / PCP to inform them that the patient pulse rate has been running slow/ Bradycardia (47). The office has agreed to bring the patient in at 3pm today. The caregiver has agreed to time and date. Last office note has been faxed over today.

## 2019-01-01 NOTE — Patient Instructions (Signed)
Start oral B12 1,000 mcg supplement daily. Will check levels at next visit. If not improved, will need to start injections to correct deficiency.   Honor Loh, MSN, APRN, FNP-C, CEN Oncology/Hematology Nurse Practitioner  [facility]   Vitamin B12 Deficiency Vitamin B12 deficiency means that your body is not getting enough vitamin B12. Your body needs vitamin B12 for important bodily functions. If you do not have enough vitamin B12 in your body, you can have health problems. Follow these instructions at home:  Take supplements only as told by your doctor. Follow the directions carefully.  Get any shots (injections) as told by your doctor. Do not miss your visits to the doctor.  Eat lots of healthy foods that contain vitamin B12. Ask your doctor if you should work with someone who is trained in how food affects health (dietitian). Foods that contain vitamin B12 include: ? Meat. ? Meat from birds (poultry). ? Fish. ? Eggs. ? Cereal and dairy products that are fortified. This means that vitamin B12 has been added to the food. Check the label on the package to see if the food is fortified.  Do not drink too much (do not abuse) alcohol.  Keep all follow-up visits as told by your doctor. This is important. Contact a doctor if:  Your symptoms come back. Get help right away if:  You have trouble breathing.  You have chest pain.  You get dizzy.  You pass out (lose consciousness). This information is not intended to replace advice given to you by your health care provider. Make sure you discuss any questions you have with your health care provider. Document Released: 10/28/2011 Document Revised: 04/15/2016 Document Reviewed: 03/26/2015 Elsevier Interactive Patient Education  2019 [employer].

## 2019-01-01 NOTE — Progress Notes (Signed)
Pt here for follow up. Latoya Jones has been discontinued d/t feelings of hopelessness. Care giver reports patient is better. States she will get down at times and has noticed this is more so in the afternoon. Patient denies any SI at this time. No concerns.

## 2019-01-02 ENCOUNTER — Telehealth: Payer: Self-pay

## 2019-01-02 NOTE — Telephone Encounter (Signed)
-----   Message from Karen Kitchens, NP sent at 01/02/2019 12:59 PM EST ----- Please let them know that her B12 resulted as HIGH. No need to start the ordered B12 supplement at this time. HOLD for future use.   Latoya Jones

## 2019-01-02 NOTE — Telephone Encounter (Signed)
Spoke with Latoya Jones to inform her that Latoya Fifita B-12 levels are high and Per Honor Loh NP to hold the B-12 for further use at this time. Latoya Jones was agreeable and understanding to hold B-12

## 2019-01-03 ENCOUNTER — Inpatient Hospital Stay: Payer: Medicare Other

## 2019-01-03 VITALS — BP 144/83 | HR 68 | Temp 96.9°F | Resp 18

## 2019-01-03 DIAGNOSIS — Z5111 Encounter for antineoplastic chemotherapy: Secondary | ICD-10-CM | POA: Diagnosis not present

## 2019-01-03 DIAGNOSIS — C189 Malignant neoplasm of colon, unspecified: Secondary | ICD-10-CM

## 2019-01-03 MED ORDER — HEPARIN SOD (PORK) LOCK FLUSH 100 UNIT/ML IV SOLN
500.0000 [IU] | Freq: Once | INTRAVENOUS | Status: AC | PRN
  Administered 2019-01-03: 500 [IU]

## 2019-01-03 MED ORDER — PEGFILGRASTIM INJECTION 6 MG/0.6ML ~~LOC~~
6.0000 mg | PREFILLED_SYRINGE | Freq: Once | SUBCUTANEOUS | Status: AC
  Administered 2019-01-03: 6 mg via SUBCUTANEOUS

## 2019-01-03 MED ORDER — SODIUM CHLORIDE 0.9% FLUSH
10.0000 mL | INTRAVENOUS | Status: DC | PRN
  Administered 2019-01-03: 10 mL
  Filled 2019-01-03: qty 10

## 2019-01-22 ENCOUNTER — Inpatient Hospital Stay: Payer: Medicare Other | Attending: Hematology and Oncology

## 2019-01-22 ENCOUNTER — Inpatient Hospital Stay: Payer: Medicare Other

## 2019-01-22 ENCOUNTER — Inpatient Hospital Stay (HOSPITAL_BASED_OUTPATIENT_CLINIC_OR_DEPARTMENT_OTHER): Payer: Medicare Other | Admitting: Urgent Care

## 2019-01-22 VITALS — BP 167/87 | HR 64 | Resp 18

## 2019-01-22 VITALS — BP 127/76 | HR 56 | Temp 97.6°F | Resp 16 | Wt 170.1 lb

## 2019-01-22 DIAGNOSIS — Z87891 Personal history of nicotine dependence: Secondary | ICD-10-CM | POA: Insufficient documentation

## 2019-01-22 DIAGNOSIS — D7589 Other specified diseases of blood and blood-forming organs: Secondary | ICD-10-CM

## 2019-01-22 DIAGNOSIS — C180 Malignant neoplasm of cecum: Secondary | ICD-10-CM | POA: Diagnosis present

## 2019-01-22 DIAGNOSIS — F200 Paranoid schizophrenia: Secondary | ICD-10-CM

## 2019-01-22 DIAGNOSIS — Z79899 Other long term (current) drug therapy: Secondary | ICD-10-CM | POA: Insufficient documentation

## 2019-01-22 DIAGNOSIS — F0390 Unspecified dementia without behavioral disturbance: Secondary | ICD-10-CM | POA: Insufficient documentation

## 2019-01-22 DIAGNOSIS — L89151 Pressure ulcer of sacral region, stage 1: Secondary | ICD-10-CM

## 2019-01-22 DIAGNOSIS — Z5112 Encounter for antineoplastic immunotherapy: Secondary | ICD-10-CM | POA: Insufficient documentation

## 2019-01-22 DIAGNOSIS — I10 Essential (primary) hypertension: Secondary | ICD-10-CM | POA: Diagnosis not present

## 2019-01-22 DIAGNOSIS — C785 Secondary malignant neoplasm of large intestine and rectum: Secondary | ICD-10-CM

## 2019-01-22 DIAGNOSIS — C189 Malignant neoplasm of colon, unspecified: Secondary | ICD-10-CM

## 2019-01-22 DIAGNOSIS — C786 Secondary malignant neoplasm of retroperitoneum and peritoneum: Secondary | ICD-10-CM | POA: Diagnosis not present

## 2019-01-22 DIAGNOSIS — Z5111 Encounter for antineoplastic chemotherapy: Secondary | ICD-10-CM | POA: Insufficient documentation

## 2019-01-22 DIAGNOSIS — R4689 Other symptoms and signs involving appearance and behavior: Secondary | ICD-10-CM

## 2019-01-22 DIAGNOSIS — R001 Bradycardia, unspecified: Secondary | ICD-10-CM | POA: Diagnosis not present

## 2019-01-22 DIAGNOSIS — Z5189 Encounter for other specified aftercare: Secondary | ICD-10-CM | POA: Diagnosis not present

## 2019-01-22 DIAGNOSIS — R6889 Other general symptoms and signs: Secondary | ICD-10-CM

## 2019-01-22 DIAGNOSIS — C787 Secondary malignant neoplasm of liver and intrahepatic bile duct: Secondary | ICD-10-CM

## 2019-01-22 DIAGNOSIS — K6389 Other specified diseases of intestine: Secondary | ICD-10-CM

## 2019-01-22 LAB — CBC WITH DIFFERENTIAL/PLATELET
Abs Immature Granulocytes: 0.02 10*3/uL (ref 0.00–0.07)
Basophils Absolute: 0.1 10*3/uL (ref 0.0–0.1)
Basophils Relative: 2 %
Eosinophils Absolute: 0.1 10*3/uL (ref 0.0–0.5)
Eosinophils Relative: 2 %
HCT: 35.5 % — ABNORMAL LOW (ref 36.0–46.0)
Hemoglobin: 11.4 g/dL — ABNORMAL LOW (ref 12.0–15.0)
Immature Granulocytes: 0 %
Lymphocytes Relative: 23 %
Lymphs Abs: 1.4 10*3/uL (ref 0.7–4.0)
MCH: 33 pg (ref 26.0–34.0)
MCHC: 32.1 g/dL (ref 30.0–36.0)
MCV: 102.9 fL — ABNORMAL HIGH (ref 80.0–100.0)
Monocytes Absolute: 0.7 10*3/uL (ref 0.1–1.0)
Monocytes Relative: 11 %
Neutro Abs: 3.9 10*3/uL (ref 1.7–7.7)
Neutrophils Relative %: 62 %
Platelets: 160 10*3/uL (ref 150–400)
RBC: 3.45 MIL/uL — ABNORMAL LOW (ref 3.87–5.11)
RDW: 15.9 % — ABNORMAL HIGH (ref 11.5–15.5)
WBC: 6.1 10*3/uL (ref 4.0–10.5)
nRBC: 0 % (ref 0.0–0.2)

## 2019-01-22 LAB — COMPREHENSIVE METABOLIC PANEL
ALT: 15 U/L (ref 0–44)
AST: 22 U/L (ref 15–41)
Albumin: 3.8 g/dL (ref 3.5–5.0)
Alkaline Phosphatase: 58 U/L (ref 38–126)
Anion gap: 9 (ref 5–15)
BUN: 11 mg/dL (ref 8–23)
CO2: 24 mmol/L (ref 22–32)
Calcium: 9.2 mg/dL (ref 8.9–10.3)
Chloride: 107 mmol/L (ref 98–111)
Creatinine, Ser: 0.7 mg/dL (ref 0.44–1.00)
GFR calc Af Amer: >60 mL/min (ref >60)
GFR calc non Af Amer: >60 mL/min (ref >60)
Glucose, Bld: 99 mg/dL (ref 70–99)
Potassium: 4.2 mmol/L (ref 3.5–5.1)
Sodium: 140 mmol/L (ref 135–145)
Total Bilirubin: 0.7 mg/dL (ref 0.3–1.2)
Total Protein: 6.4 g/dL — ABNORMAL LOW (ref 6.5–8.1)

## 2019-01-22 LAB — FOLATE: Folate: 45 ng/mL (ref >5.9)

## 2019-01-22 LAB — TSH: TSH: 1.627 u[IU]/mL (ref 0.350–4.500)

## 2019-01-22 LAB — PROTEIN, URINE, RANDOM: Total Protein, Urine: NEGATIVE mg/dL

## 2019-01-22 LAB — VITAMIN B12: Vitamin B-12: 2448 pg/mL — ABNORMAL HIGH (ref 180–914)

## 2019-01-22 LAB — MAGNESIUM: Magnesium: 2.2 mg/dL (ref 1.7–2.4)

## 2019-01-22 MED ORDER — SODIUM CHLORIDE 0.9% FLUSH
10.0000 mL | INTRAVENOUS | Status: DC | PRN
  Administered 2019-01-22: 10 mL via INTRAVENOUS
  Filled 2019-01-22: qty 10

## 2019-01-22 MED ORDER — IRINOTECAN HCL CHEMO INJECTION 100 MG/5ML
125.0000 mg/m2 | Freq: Once | INTRAVENOUS | Status: AC
  Administered 2019-01-22: 240 mg via INTRAVENOUS
  Filled 2019-01-22: qty 10

## 2019-01-22 MED ORDER — LEUCOVORIN CALCIUM INJECTION 350 MG
800.0000 mg | Freq: Once | INTRAVENOUS | Status: AC
  Administered 2019-01-22: 800 mg via INTRAVENOUS
  Filled 2019-01-22: qty 40

## 2019-01-22 MED ORDER — SODIUM CHLORIDE 0.9 % IV SOLN
800.0000 mg | Freq: Once | INTRAVENOUS | Status: AC
  Administered 2019-01-22: 800 mg via INTRAVENOUS
  Filled 2019-01-22: qty 32

## 2019-01-22 MED ORDER — ATROPINE SULFATE 1 MG/ML IJ SOLN
0.5000 mg | Freq: Once | INTRAMUSCULAR | Status: AC
  Administered 2019-01-22: 0.5 mg via INTRAVENOUS
  Filled 2019-01-22: qty 1

## 2019-01-22 MED ORDER — DEXTROSE 5 % IV SOLN
Freq: Once | INTRAVENOUS | Status: AC
  Administered 2019-01-22: 10:00:00 via INTRAVENOUS
  Filled 2019-01-22: qty 250

## 2019-01-22 MED ORDER — SODIUM CHLORIDE 0.9 % IV SOLN
2400.0000 mg/m2 | INTRAVENOUS | Status: DC
  Administered 2019-01-22: 4800 mg via INTRAVENOUS
  Filled 2019-01-22: qty 96

## 2019-01-22 MED ORDER — DEXAMETHASONE SODIUM PHOSPHATE 10 MG/ML IJ SOLN
4.0000 mg | Freq: Once | INTRAMUSCULAR | Status: AC
  Administered 2019-01-22: 4 mg via INTRAVENOUS
  Filled 2019-01-22: qty 1

## 2019-01-22 MED ORDER — HEPARIN SOD (PORK) LOCK FLUSH 100 UNIT/ML IV SOLN: 500.0000 [IU] | Freq: Once | INTRAVENOUS | Status: DC

## 2019-01-22 MED ORDER — PALONOSETRON HCL INJECTION 0.25 MG/5ML
0.2500 mg | Freq: Once | INTRAVENOUS | Status: AC
  Administered 2019-01-22: 0.25 mg via INTRAVENOUS
  Filled 2019-01-22: qty 5

## 2019-01-22 NOTE — Progress Notes (Signed)
Pt here for follow up. Denies any concerns at this time.   Reports seeing Cardiologist since last visit and Metoprolol was decreased to 25 MG Daily d/t Bradycardia. Reports everything else checked out okay.

## 2019-01-22 NOTE — Progress Notes (Signed)
All labs not processed in computer yet, but have been reviewed by MD

## 2019-01-22 NOTE — Progress Notes (Signed)
[facility] day:  01/22/2019  Chief Complaint: Latoya Jones is a 73 y.o. female with stage IV colon cancer who is seen for assessment prior to cycle #9 FOLFIRI + Avastin.  HPI:  The patient was last seen in the medical oncology clinic on 01/01/2019. At that time, patient was doing well overall. Her mood had improved following changes to her psychotropic medications.  She continued to experienced (+) agitation and "forgetfulness" in the afternoon hours. No acute or concerning symptoms.  No B symptoms or interval infections.  Eating well; weight down 3 pounds.  Exam was stable.  WBC 7000 ([place] 4500).  Hemoglobin 11.4, hematocrit 34.7, MCV 103.0, and platelets 202,000.  She received #8 FOLFIRI + Avastin on 01/01/2019.  Additional labs were added to further assess noted macrocytic anemia.  B12 found to be elevated at 2766 pg/mL.  Folate and TSH were normal.  During patient's last visit to the cancer center, she was found to have asymptomatic bradycardia with a rate of 47. Caregiver with patient today advising that patient has been seen by cardiology since her last visit and has undergone "a lot of tests". Caregiver notes that patient has had an echocardiogram, EKG, and holter study. Results not available for review. Caregiver adds that metoprolol was decreased to 25 mg daily. She is scheduled to follow up with cardiology in 3 to 4 months.  In the interim, patient has been feeling "ok".  Patient denies any nausea, vomiting, or changes to her bowel habits. Patient denies bleeding; no hematochezia, melena, or gross hematuria.  Patient denies that she has experienced any B symptoms. She denies any interval infections.  Caregiver notes the patient is sedentary, which gives rise to concern to worsening sacral skin breakdown.  Caregiver states, "all she wants to do is sit.  She does not want to use her pillow.  She does not want to listen to [place] when we tell her to walk  some to allow circulation to get to her bottom".  Caregiver with concerns that psych medications are not as effective as they have been in the past.  Of note, patient recently had 1 of her medications discontinued due to side effects.  Caregiver states, "they took her off of one medication, but did not put her on anything else to replace it".  Patient in fairly good spirits today.  She does not seem to be depressed.  Caregiver notes the patient is no longer fixated on conversations related to death and dying.  Caregiver is concerned that patient is more "forgetful" and combative later on in the day.  Patient advises that she maintains an adequate appetite. She is eating well. Weight today is 170 lb 1.4 oz (77.2 kg), which compared to her last visit to the clinic, represents a 1 pound increase.  Patient denies pain in the clinic today.   Past Medical History:  Diagnosis Date  . Colon cancer ([place])   . Hypertension     Past Surgical History:  Procedure Laterality Date  . COLOSTOMY REVISION N/A 12/06/2017   Procedure: COLON RESECTION RIGHT;  Surgeon: Florene Glen, MD;  Location: [facility] ORS;  Service: General;  Laterality: N/A;  . DILATION AND CURETTAGE, DIAGNOSTIC / THERAPEUTIC    . PORTACATH PLACEMENT N/A 01/06/2018   Procedure: INSERTION PORT-A-CATH;  Surgeon: Vickie Epley, MD;  Location: [facility] ORS;  Service: General;  Laterality: N/A;    Family History  Problem Relation Age of Onset  . ALS  Mother   . ALS Father     Social History:  reports that she quit smoking about 11 years ago. She has never used smokeless tobacco. She reports that she does not drink alcohol or use drugs.  She lives in a family care home.  She has her own room.  The patient is accompanied by family care home representative (Mrs. Owens Shark)  today.  Allergies: No Known Allergies  Current Medications: Current Outpatient Medications  Medication Sig Dispense Refill  . acetaminophen (TYLENOL) 500 MG tablet Take  1,000 mg by mouth every 8 (eight) hours as needed for mild pain.    Marland Kitchen alendronate (FOSAMAX) 70 MG tablet Take 70 mg by mouth once a week.    Marland Kitchen amLODipine (NORVASC) 10 MG tablet Take 10 mg by mouth daily.     . ARIPiprazole (ABILIFY) 15 MG tablet Take 7.5 mg by mouth daily.    . calcium carbonate (TUMS - DOSED IN MG ELEMENTAL CALCIUM) 500 MG chewable tablet Chew 1 tablet by mouth 2 (two) times daily as needed for indigestion or heartburn.    . Calcium Carbonate-Vitamin D3 (CALCIUM 600-D) 600-400 MG-UNIT TABS Take 1 tablet by mouth daily.    Marland Kitchen lidocaine-prilocaine (EMLA) cream Apply 1 application topically as needed. 1 hour prior to  each chemotherapy treatment. Place small amount over port site and place saran wrap over the cream to protect clothing 30 g 0  . loratadine (CLARITIN) 10 MG tablet Take 1 tablet (10 mg total) by mouth daily as needed for allergies. Take 1 tablet the day before pump removal, 1 tablet the day of pump removal and 1 tablet the day after pump removal. Take as directed with each cycle of  chemotherapy treatment 30 tablet 1  . memantine (NAMENDA XR) 14 MG CP24 24 hr capsule Take 14 mg by mouth at bedtime.     . metoprolol succinate (TOPROL-XL) 50 MG 24 hr tablet Take 25 mg by mouth every morning. Take with or immediately following a meal.     . Multiple Vitamins-Minerals (SENTRY SENIOR PO) Take 1 tablet by mouth daily.    . ondansetron (ZOFRAN) 8 MG tablet Take 1 tablet (8 mg total) by mouth 2 (two) times daily as needed for nausea or vomiting. Can start using on day 3 after chemo 20 tablet 0  . perphenazine (TRILAFON) 4 MG tablet Take 4 mg by mouth 2 (two) times daily.    . prochlorperazine (COMPAZINE) 10 MG tablet Take 1 tablet (10 mg total) by mouth every 6 (six) hours as needed for nausea or vomiting. 30 tablet 0  . atorvastatin (LIPITOR) 20 MG tablet Take 1 tablet by mouth daily.     No current facility-administered medications for this visit.    Facility-Administered  Medications Ordered in Other Visits  Medication Dose Route Frequency Provider Last Rate Last Dose  . dextrose 5 % solution   Intravenous Once Sindy Guadeloupe, MD      . fluorouracil (ADRUCIL) 4,800 mg in sodium chloride 0.9 % 54 mL chemo infusion  2,400 mg/m2 (Treatment Plan Recorded) Intravenous 1 day or 1 dose Mike Gip, Melissa C, MD   4,800 mg at 01/22/19 1241  . heparin lock flush 100 unit/mL  500 Units Intravenous Once Sindy Guadeloupe, MD      . heparin lock flush 100 unit/mL  500 Units Intracatheter Once PRN Sindy Guadeloupe, MD      . heparin lock flush 100 unit/mL  500 Units Intravenous Once Sindy Guadeloupe,  MD      . heparin lock flush 100 unit/mL  500 Units Intravenous Once Honor Loh E, NP      . sodium chloride flush (NS) 0.9 % injection 10 mL  10 mL Intravenous PRN Sindy Guadeloupe, MD   10 mL at 02/06/18 0845  . sodium chloride flush (NS) 0.9 % injection 10 mL  10 mL Intracatheter PRN Sindy Guadeloupe, MD      . sodium chloride flush (NS) 0.9 % injection 10 mL  10 mL Intracatheter PRN Sindy Guadeloupe, MD   10 mL at 03/06/18 0900  . sodium chloride flush (NS) 0.9 % injection 10 mL  10 mL Intravenous PRN Sindy Guadeloupe, MD   10 mL at 03/27/18 0909  . sodium chloride flush (NS) 0.9 % injection 10 mL  10 mL Intravenous PRN Karen Kitchens, NP   10 mL at 01/22/19 0846    Review of Systems  Constitutional: Negative for diaphoresis, fever, malaise/fatigue and weight loss.  HENT: Negative.   Eyes: Negative.   Respiratory: Negative for cough, hemoptysis, sputum production and shortness of breath.   Cardiovascular: Positive for leg swelling. Negative for chest pain, palpitations, orthopnea and PND.       Asymptomatic bradycardia  Gastrointestinal: Negative for abdominal pain, blood in stool, constipation, diarrhea, melena, nausea and vomiting.  Genitourinary: Negative for dysuria, frequency, hematuria and urgency.  Musculoskeletal: Negative for back pain, falls, joint pain and myalgias.  Skin:  Negative for itching and rash.       Skin breakdown to sacrum; improving  Neurological: Negative for dizziness, tremors, weakness and headaches.  Endo/Heme/Allergies: Does not bruise/bleed easily.  Psychiatric/Behavioral: Positive for depression (improving) and memory loss (+) combativeness in the late afternoon. Negative for suicidal ideas. The patient is not nervous/anxious and does not have insomnia.        (+) schizophrenia  All other systems reviewed and are negative.  Performance status (ECOG): 2 - Symptomatic, <50% confined to bed  Vital Signs BP 127/76 (BP Location: Left Arm, Patient Position: Sitting)   Pulse (!) 56   Temp 97.6 F (36.4 C) (Oral)   Resp 16   Wt 170 lb 1.4 oz (77.2 kg)   SpO2 100%   BMI 28.30 kg/m   Physical Exam  Constitutional: She is oriented to person, place, and time and well-developed, well-nourished, and in no distress.  HENT:  Head: Normocephalic and atraumatic.  Mouth/Throat: Oropharynx is clear and moist and mucous membranes are normal. Abnormal dentition (edentulous).  Eyes: Pupils are equal, round, and reactive to light. EOM are normal. No scleral icterus.  Neck: Normal range of motion. Neck supple. No tracheal deviation present. No thyromegaly present.  Cardiovascular: Regular rhythm, normal heart sounds and intact distal pulses. Bradycardia present. Exam reveals no gallop and no friction rub.  No murmur heard. Pulmonary/Chest: Effort normal and breath sounds normal. No respiratory distress. She has no wheezes. She has no rales.  Abdominal: Soft. Bowel sounds are normal. She exhibits no distension. There is no abdominal tenderness.  Musculoskeletal: Normal range of motion.        General: Edema (2+ BLE) present. No tenderness.  Lymphadenopathy:    She has no cervical adenopathy.    She has no axillary adenopathy.       Right: No inguinal and no supraclavicular adenopathy present.       Left: No inguinal and no supraclavicular adenopathy  present.  Neurological: She is alert and oriented to person, place, and  time.  Skin: Skin is warm and dry. No rash noted. No erythema.  Stage 1 pressure related skin breakdown; skin intact; minimal erythema.   Psychiatric: Mood and judgment normal. Her mood appears not anxious. She does not exhibit a depressed mood. She expresses no homicidal and no suicidal ideation. She exhibits abnormal recent memory and abnormal remote memory. She has a flat affect.  Nursing note and vitals reviewed.   Imaging studies: 12/03/2017:  Abdomen and pelvic CT revealed a large cecal mass causing secondary appendicitis. 12/03/2017:  CT chest without contrast revealed tiny 2 mm right upper lobe pulmonary nodule.  04/20/2018:  Chest, abdomen, and pelvic CT revealed peritoneal nodularity within the right pericolic gutter which may represent postsurgical change and/or residual disease. There was a soft tissue mass which appeared to be involving the distal small bowel within the right lower quadrant which may represent residual/recurrent disease. There was soft tissue irregularity and fluid within the right adnexa which may represent postsurgical changes or residual/recurrent disease. 07/27/2018:  Chest, abdomen and pelvic CT revealed interval increase in size of right hepatic lobe lesion concerning hepatic metastatic disease.  There was similar-appearing nodularity within the right pericolic gutter which may represent metastatic disease or postsurgical changes.  There was similar-appearing soft tissue mass within the distal small bowel concerning for residual disease.  There was stable bilateral adrenal nodules.  There was interval development of patchy ground-glass opacities predominantly within the upper lobes bilaterally, favored to be infectious/inflammatory in etiology.  10/24/2018:  Chest, abdomen, and pelvic CT revealed no pulmonary metastatic disease.  There was slight interval decrease in size of the right hepatic  lobe metastatic lesion.  There were no new lesions or progressive findings.  There was stable soft tissue lesions involving the colon near the anastomosis and also 2 separate right lower quadrant small bowel loops.  There were no new or progressive findings.  There were stable small scattered adjacent mesenteric nodes.   Infusion on 01/22/2019  Component Date Value Ref Range Status  . Folate 01/22/2019 45.0  >5.9 ng/mL Final   Performed at [facility], [address] [place], [place] [address]  . TSH 01/22/2019 1.627  0.350 - 4.500 uIU/mL Final   Comment: Performed by a 3rd Generation assay with a functional sensitivity of <=0.01 uIU/mL. Performed at [facility], [address], [place], [place] [address]   . Total Protein, Urine 01/22/2019 NEGATIVE  mg/dL Final   Performed at [facility], [address] [place], [place] [address]  . Magnesium 01/22/2019 2.2  1.7 - 2.4 mg/dL Final   Performed at [facility], [address] [place],  [address]  . Sodium 01/22/2019 140  135 - 145 mmol/L Final  . Potassium 01/22/2019 4.2  3.5 - 5.1 mmol/L Final  . Chloride 01/22/2019 107  98 - 111 mmol/L Final  . CO2 01/22/2019 24  22 - 32 mmol/L Final  . Glucose, Bld 01/22/2019 99  70 - 99 mg/dL Final  . BUN 01/22/2019 11  8 - 23 mg/dL Final  . Creatinine, Ser 01/22/2019 0.70  0.44 - 1.00 mg/dL Final  . Calcium 01/22/2019 9.2  8.9 - 10.3 mg/dL Final  . Total Protein 01/22/2019 6.4* 6.5 - 8.1 g/dL Final  . Albumin 01/22/2019 3.8  3.5 - 5.0 g/dL Final  . AST 01/22/2019 22  15 - 41 U/L Final  . ALT 01/22/2019 15  0 - 44 U/L Final  . Alkaline Phosphatase 01/22/2019 58  38 -  126 U/L Final  . Total Bilirubin 01/22/2019 0.7  0.3 - 1.2 mg/dL Final  . GFR calc non Af Amer 01/22/2019 >60  >60 mL/min Final  . GFR calc Af Amer 01/22/2019 >60  >60 mL/min Final  . Anion gap 01/22/2019 9  5 - 15 Final   Performed at [facility], [address],  [place], [place] [address]  . WBC 01/22/2019 6.1  4.0 - 10.5 K/uL Final  . RBC 01/22/2019 3.45* 3.87 - 5.11 MIL/uL Final  . Hemoglobin 01/22/2019 11.4* 12.0 - 15.0 g/dL Final  . HCT 01/22/2019 35.5* 36.0 - 46.0 % Final  . MCV 01/22/2019 102.9* 80.0 - 100.0 fL Final  . MCH 01/22/2019 33.0  26.0 - 34.0 pg Final  . MCHC 01/22/2019 32.1  30.0 - 36.0 g/dL Final  . RDW 01/22/2019 15.9* 11.5 - 15.5 % Final  . Platelets 01/22/2019 160  150 - 400 K/uL Final  . nRBC 01/22/2019 0.0  0.0 - 0.2 % Final  . Neutrophils Relative % 01/22/2019 62  % Final  . Neutro Abs 01/22/2019 3.9  1.7 - 7.7 K/uL Final  . Lymphocytes Relative 01/22/2019 23  % Final  . Lymphs Abs 01/22/2019 1.4  0.7 - 4.0 K/uL Final  . Monocytes Relative 01/22/2019 11  % Final  . Monocytes Absolute 01/22/2019 0.7  0.1 - 1.0 K/uL Final  . Eosinophils Relative 01/22/2019 2  % Final  . Eosinophils Absolute 01/22/2019 0.1  0.0 - 0.5 K/uL Final  . Basophils Relative 01/22/2019 2  % Final  . Basophils Absolute 01/22/2019 0.1  0.0 - 0.1 K/uL Final  . Immature Granulocytes 01/22/2019 0  % Final  . Abs Immature Granulocytes 01/22/2019 0.02  0.00 - 0.07 K/uL Final   Performed at [facility], [address], [place], [place] [address]    Assessment:  Latoya Jones is a 73 y.o. female with stage IV colon cancer with peritoneal metastasis and possible liver metastasis.  She is s/p exploratory laparotomy with right hemicolectomy and terminal ileum resection and separate small bowel resection on 12/06/2017. 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 Radial margin was positive for invasive carcinoma.  There were 2 colon adenomas (2.5 and 1.8 cm) of the ascending colon. Metastatic adenocarcinoma was seen in 4 of 13 lymph nodes. There were 2 tumor deposits.  There was a segment of small intestine with abscess which also showed adenocarcinoma invading the distal appendix with  perforation. One mesenteric lymph node was negative for malignancy. Retroperitoneal tumor removal was positive for mucinous adenocarcinoma. Proximal and distal margins were negative.  Pathologic stage was [id number]. MSI stable. KRAS mutation was positive  She received 5 cycles of FOLFOX (01/10/2018 - 03/27/2018).  She began Neulasta with cycle #2.  Oxaliplatin was decreased from 85 mg/m2 to 65 mg/m2 with cycle #4.  She received 6 cycles of 5FU + LV with Avastin (05/01/2018 - 07/17/2018).  She is s/p 8 cycles of FOLIFIRI + Avastin (07/31/2018 - 01/01/2019).  She receives chemotherapy every 3 weeks secondary to tolerance.  Irinotecan was decreased from 150 mg/m2 to 125 mg/m2 with cycle #2.  5FU bolus was discontinued with cycle #6.  Chest, abdomen, and pelvic CT on 10/24/2018 revealed no pulmonary metastatic disease.  There was slight interval decrease in size of the right hepatic lobe metastatic lesion. There were no new lesions or progressive findings.  There was stable soft tissue lesions involving the colon near the anastomosis  and also 2 separate right lower quadrant small bowel loops.  There was no new or progressive findings.  There were stable small scattered adjacent mesenteric nodes.  CEA has been followed: 4.8 on 12/04/2017, 3.1 on 01/10/2018, 4.2 on 03/06/2018, 3.8 on 05/01/2018, 9.2 on 07/31/2018, 5.9 on 09/18/2018, and 5.0 on 10/30/2018.  She has macrocytic RBC indices.  She has a history of a low B12 level.  B12 was 266 on 01/30/2018.  Folate was 32 on 01/30/2018.  TSH was 0.913 on 12/03/2017.  She has paranoid schizophrenia with delusions.  She lives in a group home.  Symptomatically, patient is doing well overall.  She denies any acute complaints.  She has been seen in consult by cardiology for further evaluation of her asymptomatic bradycardia; work-up negative.  Caregiver with concerns about afternoon combativeness and patient sitting a lot.  Eating well; weight up 1 pound.  Exam  reveals stage I pressure related skin damage to sacrum, and chronic swelling to bilateral ankles.  WBC 6100 ([place] 3900).  Hemoglobin 11.4, hematocrit 35.5, MCV 102.9, and platelets 160,000.   Plan: 1. Labs today:  CBC with diff, CMP, Mg, B12, folate, TSH, urine protein. 2. Stage IV colon cancer  Doing well overall.  No gastrointestinal symptoms or bleeding.  No pain.  Labs reviewed. Blood counts stable and adequate enough for treatment. Will proceed with cycle # cycle #9 FOLFIRI + Avastin with Neulasta support. Patient has antiemetics at home to use on a PRN basis, and advises that the prescribed interventions are adequately managing her symptoms at this point.  Continue all medications as previously prescribed.  3. Macrocytic RBC indices  Review labs.  Hemoglobin 11.4, hematocrit 35.5, and MCV 102.9 today.  Review labs from 01/01/2019. B12 elevated at 2766 pg/mL TSH and folate normal.  Suspect macrocytosis secondary to underlying malignancy.    Continue routine lab monitoring. 4. Asymptomatic bradycardia  Heart rate remains low at 56 bpm today.   Remains asymptomatic.  Patient has been seen in consult by cardiology.  Will need to obtain records from Dr. Humphrey Rolls for review (office notes, echo, EKG, and event monitor study).  Beta-blocker (metoprolol) dose has been decreased from 50 mg to 25 mg daily. 5. Stage I sacral decubitus  Stable with no signs of progression.  Encourage patient to increase activity level and to utilize pressure reducing cushion to prevent exacerbation. 6. Psychological concerns  Patient has underlying schizophrenia diagnosis.  Periods of increasing forgetfulness and combativeness in the later hours of the day.  Followed by psychiatry with recent medication changes.  Encourage caregiver to have patient follow-up with psychiatry for ongoing management and evaluation. 7. RTC in 3 weeks for MD assessment, labs (CBC with diff, CMP, Mg, B12, urine protein,  CEA), and cycle #10 FOLFIRI + Avastin.  Honor Loh, NP  01/22/2019, 12:29 PM

## 2019-01-22 NOTE — Patient Instructions (Signed)
Leucovorin injection What is this medicine? LEUCOVORIN (loo koe VOR in) is used to prevent or treat the harmful effects of some medicines. This medicine is used to treat anemia caused by a low amount of folic acid in the body. It is also used with 5-fluorouracil (5-FU) to treat colon cancer. This medicine may be used for other purposes; ask your health care provider or pharmacist if you have questions. What should I tell my health care provider before I take this medicine? They need to know if you have any of these conditions: -anemia from low levels of vitamin B-12 in the blood -an unusual or allergic reaction to leucovorin, folic acid, other medicines, foods, dyes, or preservatives -pregnant or trying to get pregnant -breast-feeding How should I use this medicine? This medicine is for injection into a muscle or into a vein. It is given by a health care professional in a hospital or clinic setting. Talk to your pediatrician regarding the use of this medicine in children. Special care may be needed. Overdosage: If you think you have taken too much of this medicine contact a poison control center or emergency room at once. NOTE: This medicine is only for you. Do not share this medicine with others. What if I miss a dose? This does not apply. What may interact with this medicine? -capecitabine -fluorouracil -phenobarbital -phenytoin -primidone -trimethoprim-sulfamethoxazole This list may not describe all possible interactions. Give your health care provider a list of all the medicines, herbs, non-prescription drugs, or dietary supplements you use. Also tell them if you smoke, drink alcohol, or use illegal drugs. Some items may interact with your medicine. What should I watch for while using this medicine? Your condition will be monitored carefully while you are receiving this medicine. This medicine may increase the side effects of 5-fluorouracil, 5-FU. Tell your doctor or health care  professional if you have diarrhea or mouth sores that do not get better or that get worse. What side effects may I notice from receiving this medicine? Side effects that you should report to your doctor or health care professional as soon as possible: -allergic reactions like skin rash, itching or hives, swelling of the face, lips, or tongue -breathing problems -fever, infection -mouth sores -unusual bleeding or bruising -unusually weak or tired Side effects that usually do not require medical attention (report to your doctor or health care professional if they continue or are bothersome): -constipation or diarrhea -loss of appetite -nausea, vomiting This list may not describe all possible side effects. Call your doctor for medical advice about side effects. You may report side effects to FDA at 1-800-FDA-1088. Where should I keep my medicine? This drug is given in a hospital or clinic and will not be stored at home. NOTE: This sheet is a summary. It may not cover all possible information. If you have questions about this medicine, talk to your doctor, pharmacist, or health care provider.  2019 Elsevier/Gold Standard (2008-05-14 16:50:29) Bevacizumab injection What is this medicine? BEVACIZUMAB (be va SIZ yoo mab) is a monoclonal antibody. It is used to treat many types of cancer. This medicine may be used for other purposes; ask your health care provider or pharmacist if you have questions. COMMON BRAND NAME(S): Avastin, MVASI What should I tell my health care provider before I take this medicine? They need to know if you have any of these conditions: -diabetes -heart disease -high blood pressure -history of coughing up blood -prior anthracycline chemotherapy (e.g., doxorubicin, daunorubicin, epirubicin) -recent or ongoing  radiation therapy -recent or planning to have surgery -stroke -an unusual or allergic reaction to bevacizumab, hamster proteins, mouse proteins, other medicines,  foods, dyes, or preservatives -pregnant or trying to get pregnant -breast-feeding How should I use this medicine? This medicine is for infusion into a vein. It is given by a health care professional in a hospital or clinic setting. Talk to your pediatrician regarding the use of this medicine in children. Special care may be needed. Overdosage: If you think you have taken too much of this medicine contact a poison control center or emergency room at once. NOTE: This medicine is only for you. Do not share this medicine with others. What if I miss a dose? It is important not to miss your dose. Call your doctor or health care professional if you are unable to keep an appointment. What may interact with this medicine? Interactions are not expected. This list may not describe all possible interactions. Give your health care provider a list of all the medicines, herbs, non-prescription drugs, or dietary supplements you use. Also tell them if you smoke, drink alcohol, or use illegal drugs. Some items may interact with your medicine. What should I watch for while using this medicine? Your condition will be monitored carefully while you are receiving this medicine. You will need important blood work and urine testing done while you are taking this medicine. This medicine may increase your risk to bruise or bleed. Call your doctor or health care professional if you notice any unusual bleeding. This medicine should be started at least 28 days following major surgery and the site of the surgery should be totally healed. Check with your doctor before scheduling dental work or surgery while you are receiving this treatment. Talk to your doctor if you have recently had surgery or if you have a wound that has not healed. Do not become pregnant while taking this medicine or for 6 months after stopping it. Women should inform their doctor if they wish to become pregnant or think they might be pregnant. There is a  potential for serious side effects to an unborn child. Talk to your health care professional or pharmacist for more information. Do not breast-feed an infant while taking this medicine and for 6 months after the last dose. This medicine has caused ovarian failure in some women. This medicine may interfere with the ability to have a child. You should talk to your doctor or health care professional if you are concerned about your fertility. What side effects may I notice from receiving this medicine? Side effects that you should report to your doctor or health care professional as soon as possible: -allergic reactions like skin rash, itching or hives, swelling of the face, lips, or tongue -chest pain or chest tightness -chills -coughing up blood -high fever -seizures -severe constipation -signs and symptoms of bleeding such as bloody or black, tarry stools; red or dark-brown urine; spitting up blood or brown material that looks like coffee grounds; red spots on the skin; unusual bruising or bleeding from the eye, gums, or nose -signs and symptoms of a blood clot such as breathing problems; chest pain; severe, sudden headache; pain, swelling, warmth in the leg -signs and symptoms of a stroke like changes in vision; confusion; trouble speaking or understanding; severe headaches; sudden numbness or weakness of the face, arm or leg; trouble walking; dizziness; loss of balance or coordination -stomach pain -sweating -swelling of legs or ankles -vomiting -weight gain Side effects that usually do not  require medical attention (report to your doctor or health care professional if they continue or are bothersome): -back pain -changes in taste -decreased appetite -dry skin -nausea -tiredness This list may not describe all possible side effects. Call your doctor for medical advice about side effects. You may report side effects to FDA at 1-800-FDA-1088. Where should I keep my medicine? This drug is  given in a hospital or clinic and will not be stored at home. NOTE: This sheet is a summary. It may not cover all possible information. If you have questions about this medicine, talk to your doctor, pharmacist, or health care provider.  2019 Elsevier/Gold Standard (2016-11-05 14:33:29) Fluorouracil, 5-FU injection What is this medicine? FLUOROURACIL, 5-FU (flure oh YOOR a sil) is a chemotherapy drug. It slows the growth of cancer cells. This medicine is used to treat many types of cancer like breast cancer, colon or rectal cancer, pancreatic cancer, and stomach cancer. This medicine may be used for other purposes; ask your health care provider or pharmacist if you have questions. COMMON BRAND NAME(S): Adrucil What should I tell my health care provider before I take this medicine? They need to know if you have any of these conditions: -blood disorders -dihydropyrimidine dehydrogenase (DPD) deficiency -infection (especially a virus infection such as chickenpox, cold sores, or herpes) -kidney disease -liver disease -malnourished, poor nutrition -recent or ongoing radiation therapy -an unusual or allergic reaction to fluorouracil, other chemotherapy, other medicines, foods, dyes, or preservatives -pregnant or trying to get pregnant -breast-feeding How should I use this medicine? This drug is given as an infusion or injection into a vein. It is administered in a hospital or clinic by a specially trained health care professional. Talk to your pediatrician regarding the use of this medicine in children. Special care may be needed. Overdosage: If you think you have taken too much of this medicine contact a poison control center or emergency room at once. NOTE: This medicine is only for you. Do not share this medicine with others. What if I miss a dose? It is important not to miss your dose. Call your doctor or health care professional if you are unable to keep an appointment. What may interact  with this medicine? -allopurinol -cimetidine -dapsone -digoxin -hydroxyurea -leucovorin -levamisole -medicines for seizures like ethotoin, fosphenytoin, phenytoin -medicines to increase blood counts like filgrastim, pegfilgrastim, sargramostim -medicines that treat or prevent blood clots like warfarin, enoxaparin, and dalteparin -methotrexate -metronidazole -pyrimethamine -some other chemotherapy drugs like busulfan, cisplatin, estramustine, vinblastine -trimethoprim -trimetrexate -vaccines Talk to your doctor or health care professional before taking any of these medicines: -acetaminophen -aspirin -ibuprofen -ketoprofen -naproxen This list may not describe all possible interactions. Give your health care provider a list of all the medicines, herbs, non-prescription drugs, or dietary supplements you use. Also tell them if you smoke, drink alcohol, or use illegal drugs. Some items may interact with your medicine. What should I watch for while using this medicine? Visit your doctor for checks on your progress. This drug may make you feel generally unwell. This is not uncommon, as chemotherapy can affect healthy cells as well as cancer cells. Report any side effects. Continue your course of treatment even though you feel ill unless your doctor tells you to stop. In some cases, you may be given additional medicines to help with side effects. Follow all directions for their use. Call your doctor or health care professional for advice if you get a fever, chills or sore throat, or other symptoms of a  cold or flu. Do not treat yourself. This drug decreases your body's ability to fight infections. Try to avoid being around people who are sick. This medicine may increase your risk to bruise or bleed. Call your doctor or health care professional if you notice any unusual bleeding. Be careful brushing and flossing your teeth or using a toothpick because you may get an infection or bleed more  easily. If you have any dental work done, tell your dentist you are receiving this medicine. Avoid taking products that contain aspirin, acetaminophen, ibuprofen, naproxen, or ketoprofen unless instructed by your doctor. These medicines may hide a fever. Do not become pregnant while taking this medicine. Women should inform their doctor if they wish to become pregnant or think they might be pregnant. There is a potential for serious side effects to an unborn child. Talk to your health care professional or pharmacist for more information. Do not breast-feed an infant while taking this medicine. Men should inform their doctor if they wish to father a child. This medicine may lower sperm counts. Do not treat diarrhea with over the counter products. Contact your doctor if you have diarrhea that lasts more than 2 days or if it is severe and watery. This medicine can make you more sensitive to the sun. Keep out of the sun. If you cannot avoid being in the sun, wear protective clothing and use sunscreen. Do not use sun lamps or tanning beds/booths. What side effects may I notice from receiving this medicine? Side effects that you should report to your doctor or health care professional as soon as possible: -allergic reactions like skin rash, itching or hives, swelling of the face, lips, or tongue -low blood counts - this medicine may decrease the number of white blood cells, red blood cells and platelets. You may be at increased risk for infections and bleeding. -signs of infection - fever or chills, cough, sore throat, pain or difficulty passing urine -signs of decreased platelets or bleeding - bruising, pinpoint red spots on the skin, black, tarry stools, blood in the urine -signs of decreased red blood cells - unusually weak or tired, fainting spells, lightheadedness -breathing problems -changes in vision -chest pain -mouth sores -nausea and vomiting -pain, swelling, redness at site where  injected -pain, tingling, numbness in the hands or feet -redness, swelling, or sores on hands or feet -stomach pain -unusual bleeding Side effects that usually do not require medical attention (report to your doctor or health care professional if they continue or are bothersome): -changes in finger or toe nails -diarrhea -dry or itchy skin -hair loss -headache -loss of appetite -sensitivity of eyes to the light -stomach upset -unusually teary eyes This list may not describe all possible side effects. Call your doctor for medical advice about side effects. You may report side effects to FDA at 1-800-FDA-1088. Where should I keep my medicine? This drug is given in a hospital or clinic and will not be stored at home. NOTE: This sheet is a summary. It may not cover all possible information. If you have questions about this medicine, talk to your doctor, pharmacist, or health care provider.  2019 Elsevier/Gold Standard (2008-03-13 13:53:16) Irinotecan injection What is this medicine? IRINOTECAN (ir in oh TEE kan ) is a chemotherapy drug. It is used to treat colon and rectal cancer. This medicine may be used for other purposes; ask your health care provider or pharmacist if you have questions. COMMON BRAND NAME(S): Camptosar What should I tell my health care  provider before I take this medicine? They need to know if you have any of these conditions: -dehydration -diarrhea -infection (especially a virus infection such as chickenpox, cold sores, or herpes) -liver disease -low blood counts, like low white cell, platelet, or red cell counts -low levels of calcium, magnesium, or potassium in the blood -recent or ongoing radiation therapy -an unusual or allergic reaction to irinotecan, other medicines, foods, dyes, or preservatives -pregnant or trying to get pregnant -breast-feeding How should I use this medicine? This drug is given as an infusion into a vein. It is administered in a  hospital or clinic by a specially trained health care professional. Talk to your pediatrician regarding the use of this medicine in children. Special care may be needed. Overdosage: If you think you have taken too much of this medicine contact a poison control center or emergency room at once. NOTE: This medicine is only for you. Do not share this medicine with others. What if I miss a dose? It is important not to miss your dose. Call your doctor or health care professional if you are unable to keep an appointment. What may interact with this medicine? This medicine may interact with the following medications: -antiviral medicines for HIV or AIDS -certain antibiotics like rifampin or rifabutin -certain medicines for fungal infections like itraconazole, ketoconazole, posaconazole, and voriconazole -certain medicines for seizures like carbamazepine, phenobarbital, phenotoin -clarithromycin -gemfibrozil -nefazodone -St. John's Wort This list may not describe all possible interactions. Give your health care provider a list of all the medicines, herbs, non-prescription drugs, or dietary supplements you use. Also tell them if you smoke, drink alcohol, or use illegal drugs. Some items may interact with your medicine. What should I watch for while using this medicine? Your condition will be monitored carefully while you are receiving this medicine. You will need important blood work done while you are taking this medicine. This drug may make you feel generally unwell. This is not uncommon, as chemotherapy can affect healthy cells as well as cancer cells. Report any side effects. Continue your course of treatment even though you feel ill unless your doctor tells you to stop. In some cases, you may be given additional medicines to help with side effects. Follow all directions for their use. You may get drowsy or dizzy. Do not drive, use machinery, or do anything that needs mental alertness until you know  how this medicine affects you. Do not stand or sit up quickly, especially if you are an older patient. This reduces the risk of dizzy or fainting spells. Call your doctor or health care professional for advice if you get a fever, chills or sore throat, or other symptoms of a cold or flu. Do not treat yourself. This drug decreases your body's ability to fight infections. Try to avoid being around people who are sick. This medicine may increase your risk to bruise or bleed. Call your doctor or health care professional if you notice any unusual bleeding. Be careful brushing and flossing your teeth or using a toothpick because you may get an infection or bleed more easily. If you have any dental work done, tell your dentist you are receiving this medicine. Avoid taking products that contain aspirin, acetaminophen, ibuprofen, naproxen, or ketoprofen unless instructed by your doctor. These medicines may hide a fever. Do not become pregnant while taking this medicine. Women should inform their doctor if they wish to become pregnant or think they might be pregnant. There is a potential for serious side  effects to an unborn child. Talk to your health care professional or pharmacist for more information. Do not breast-feed an infant while taking this medicine. What side effects may I notice from receiving this medicine? Side effects that you should report to your doctor or health care professional as soon as possible: -allergic reactions like skin rash, itching or hives, swelling of the face, lips, or tongue -chest pain -diarrhea -flushing, runny nose, sweating during infusion -low blood counts - this medicine may decrease the number of white blood cells, red blood cells and platelets. You may be at increased risk for infections and bleeding. -nausea, vomiting -pain, swelling, warmth in the leg -signs of decreased platelets or bleeding - bruising, pinpoint red spots on the skin, black, tarry stools, blood in  the urine -signs of infection - fever or chills, cough, sore throat, pain or difficulty passing urine -signs of decreased red blood cells - unusually weak or tired, fainting spells, lightheadedness Side effects that usually do not require medical attention (report to your doctor or health care professional if they continue or are bothersome): -constipation -hair loss -headache -loss of appetite -mouth sores -stomach pain This list may not describe all possible side effects. Call your doctor for medical advice about side effects. You may report side effects to FDA at 1-800-FDA-1088. Where should I keep my medicine? This drug is given in a hospital or clinic and will not be stored at home. NOTE: This sheet is a summary. It may not cover all possible information. If you have questions about this medicine, talk to your doctor, pharmacist, or health care provider.  2019 Elsevier/Gold Standard (2018-01-24 15:02:58)

## 2019-01-24 ENCOUNTER — Inpatient Hospital Stay: Payer: Medicare Other

## 2019-01-24 VITALS — BP 133/79 | HR 18 | Temp 96.6°F | Resp 20

## 2019-01-24 DIAGNOSIS — C189 Malignant neoplasm of colon, unspecified: Secondary | ICD-10-CM

## 2019-01-24 DIAGNOSIS — Z5112 Encounter for antineoplastic immunotherapy: Secondary | ICD-10-CM | POA: Diagnosis not present

## 2019-01-24 MED ORDER — PEGFILGRASTIM INJECTION 6 MG/0.6ML ~~LOC~~
6.0000 mg | PREFILLED_SYRINGE | Freq: Once | SUBCUTANEOUS | Status: AC
  Administered 2019-01-24: 6 mg via SUBCUTANEOUS

## 2019-01-24 MED ORDER — SODIUM CHLORIDE 0.9% FLUSH
10.0000 mL | INTRAVENOUS | Status: DC | PRN
  Administered 2019-01-24: 10 mL
  Filled 2019-01-24: qty 10

## 2019-01-24 MED ORDER — HEPARIN SOD (PORK) LOCK FLUSH 100 UNIT/ML IV SOLN
500.0000 [IU] | Freq: Once | INTRAVENOUS | Status: AC | PRN
  Administered 2019-01-24: 500 [IU]

## 2019-01-29 ENCOUNTER — Other Ambulatory Visit: Payer: Medicare Other

## 2019-01-29 ENCOUNTER — Ambulatory Visit: Payer: Medicare Other

## 2019-01-29 ENCOUNTER — Ambulatory Visit: Payer: Medicare Other | Admitting: Hematology and Oncology

## 2019-02-12 ENCOUNTER — Other Ambulatory Visit: Payer: Self-pay

## 2019-02-13 ENCOUNTER — Inpatient Hospital Stay: Payer: Medicare Other

## 2019-02-13 ENCOUNTER — Ambulatory Visit
Admission: RE | Admit: 2019-02-13 | Discharge: 2019-02-13 | Disposition: A | Discharge status: Home / Self Care | Payer: Medicare Other | Attending: Urgent Care | Admitting: Urgent Care

## 2019-02-13 ENCOUNTER — Ambulatory Visit
Admission: RE | Admit: 2019-02-13 | Discharge: 2019-02-13 | Disposition: A | Discharge status: Home / Self Care | Payer: Medicare Other | Source: Ambulatory Visit | Attending: Urgent Care | Admitting: Urgent Care

## 2019-02-13 ENCOUNTER — Inpatient Hospital Stay (HOSPITAL_BASED_OUTPATIENT_CLINIC_OR_DEPARTMENT_OTHER): Payer: Medicare Other | Admitting: Hematology and Oncology

## 2019-02-13 ENCOUNTER — Other Ambulatory Visit: Payer: Self-pay

## 2019-02-13 ENCOUNTER — Encounter: Payer: Self-pay | Admitting: Hematology and Oncology

## 2019-02-13 VITALS — BP 134/71 | HR 53 | Temp 98.3°F | Resp 18 | Ht 65.0 in | Wt 167.9 lb

## 2019-02-13 VITALS — BP 165/84 | HR 61 | Temp 97.4°F | Resp 18

## 2019-02-13 DIAGNOSIS — R001 Bradycardia, unspecified: Secondary | ICD-10-CM

## 2019-02-13 DIAGNOSIS — Z79899 Other long term (current) drug therapy: Secondary | ICD-10-CM

## 2019-02-13 DIAGNOSIS — K6389 Other specified diseases of intestine: Secondary | ICD-10-CM

## 2019-02-13 DIAGNOSIS — Z452 Encounter for adjustment and management of vascular access device: Secondary | ICD-10-CM | POA: Diagnosis not present

## 2019-02-13 DIAGNOSIS — Z5112 Encounter for antineoplastic immunotherapy: Secondary | ICD-10-CM

## 2019-02-13 DIAGNOSIS — C785 Secondary malignant neoplasm of large intestine and rectum: Secondary | ICD-10-CM

## 2019-02-13 DIAGNOSIS — Z5111 Encounter for antineoplastic chemotherapy: Secondary | ICD-10-CM

## 2019-02-13 DIAGNOSIS — C180 Malignant neoplasm of cecum: Secondary | ICD-10-CM | POA: Diagnosis not present

## 2019-02-13 DIAGNOSIS — F200 Paranoid schizophrenia: Secondary | ICD-10-CM

## 2019-02-13 DIAGNOSIS — C786 Secondary malignant neoplasm of retroperitoneum and peritoneum: Secondary | ICD-10-CM

## 2019-02-13 DIAGNOSIS — C787 Secondary malignant neoplasm of liver and intrahepatic bile duct: Secondary | ICD-10-CM

## 2019-02-13 DIAGNOSIS — L89151 Pressure ulcer of sacral region, stage 1: Secondary | ICD-10-CM

## 2019-02-13 DIAGNOSIS — I10 Essential (primary) hypertension: Secondary | ICD-10-CM

## 2019-02-13 DIAGNOSIS — F0390 Unspecified dementia without behavioral disturbance: Secondary | ICD-10-CM

## 2019-02-13 DIAGNOSIS — C189 Malignant neoplasm of colon, unspecified: Secondary | ICD-10-CM

## 2019-02-13 DIAGNOSIS — Z87891 Personal history of nicotine dependence: Secondary | ICD-10-CM

## 2019-02-13 DIAGNOSIS — Z7189 Other specified counseling: Secondary | ICD-10-CM

## 2019-02-13 LAB — CBC WITH DIFFERENTIAL/PLATELET
Abs Immature Granulocytes: 0.02 10*3/uL (ref 0.00–0.07)
Basophils Absolute: 0.1 10*3/uL (ref 0.0–0.1)
Basophils Relative: 2 %
Eosinophils Absolute: 0.1 10*3/uL (ref 0.0–0.5)
Eosinophils Relative: 1 %
HCT: 35.5 % — ABNORMAL LOW (ref 36.0–46.0)
Hemoglobin: 11.4 g/dL — ABNORMAL LOW (ref 12.0–15.0)
Immature Granulocytes: 0 %
Lymphocytes Relative: 26 %
Lymphs Abs: 1.5 10*3/uL (ref 0.7–4.0)
MCH: 32.4 pg (ref 26.0–34.0)
MCHC: 32.1 g/dL (ref 30.0–36.0)
MCV: 100.9 fL — ABNORMAL HIGH (ref 80.0–100.0)
Monocytes Absolute: 0.6 10*3/uL (ref 0.1–1.0)
Monocytes Relative: 11 %
Neutro Abs: 3.5 10*3/uL (ref 1.7–7.7)
Neutrophils Relative %: 60 %
Platelets: 159 10*3/uL (ref 150–400)
RBC: 3.52 MIL/uL — ABNORMAL LOW (ref 3.87–5.11)
RDW: 15.5 % (ref 11.5–15.5)
WBC: 5.9 10*3/uL (ref 4.0–10.5)
nRBC: 0 % (ref 0.0–0.2)

## 2019-02-13 LAB — COMPREHENSIVE METABOLIC PANEL
ALT: 19 U/L (ref 0–44)
AST: 23 U/L (ref 15–41)
Albumin: 3.7 g/dL (ref 3.5–5.0)
Alkaline Phosphatase: 71 U/L (ref 38–126)
Anion gap: 5 (ref 5–15)
BUN: 10 mg/dL (ref 8–23)
CO2: 24 mmol/L (ref 22–32)
Calcium: 9 mg/dL (ref 8.9–10.3)
Chloride: 106 mmol/L (ref 98–111)
Creatinine, Ser: 0.6 mg/dL (ref 0.44–1.00)
GFR calc Af Amer: >60 mL/min (ref >60)
GFR calc non Af Amer: >60 mL/min (ref >60)
Glucose, Bld: 126 mg/dL — ABNORMAL HIGH (ref 70–99)
Potassium: 3.9 mmol/L (ref 3.5–5.1)
Sodium: 135 mmol/L (ref 135–145)
Total Bilirubin: 0.7 mg/dL (ref 0.3–1.2)
Total Protein: 6.4 g/dL — ABNORMAL LOW (ref 6.5–8.1)

## 2019-02-13 LAB — MAGNESIUM: Magnesium: 2 mg/dL (ref 1.7–2.4)

## 2019-02-13 LAB — PROTEIN, URINE, RANDOM: Total Protein, Urine: NEGATIVE mg/dL

## 2019-02-13 MED ORDER — SODIUM CHLORIDE 0.9% FLUSH
10.0000 mL | INTRAVENOUS | Status: DC | PRN
  Filled 2019-02-13: qty 10

## 2019-02-13 MED ORDER — SODIUM CHLORIDE 0.9 % IV SOLN
2400.0000 mg/m2 | INTRAVENOUS | Status: DC
  Administered 2019-02-13: 4800 mg via INTRAVENOUS
  Filled 2019-02-13: qty 96

## 2019-02-13 MED ORDER — DEXAMETHASONE SODIUM PHOSPHATE 10 MG/ML IJ SOLN
4.0000 mg | Freq: Once | INTRAMUSCULAR | Status: AC
  Administered 2019-02-13: 4 mg via INTRAVENOUS
  Filled 2019-02-13: qty 1

## 2019-02-13 MED ORDER — SODIUM CHLORIDE 0.9 % IV SOLN
800.0000 mg | Freq: Once | INTRAVENOUS | Status: AC
  Administered 2019-02-13: 800 mg via INTRAVENOUS
  Filled 2019-02-13: qty 32

## 2019-02-13 MED ORDER — LEUCOVORIN CALCIUM INJECTION 350 MG
800.0000 mg | Freq: Once | INTRAVENOUS | Status: AC
  Administered 2019-02-13: 800 mg via INTRAVENOUS
  Filled 2019-02-13: qty 35

## 2019-02-13 MED ORDER — SODIUM CHLORIDE 0.9% FLUSH
10.0000 mL | INTRAVENOUS | Status: DC | PRN
  Administered 2019-02-13 (×2): 10 mL via INTRAVENOUS
  Filled 2019-02-13: qty 10

## 2019-02-13 MED ORDER — ALTEPLASE 2 MG IJ SOLR
2.0000 mg | Freq: Once | INTRAMUSCULAR | Status: AC | PRN
  Administered 2019-02-13: 2 mg

## 2019-02-13 MED ORDER — PALONOSETRON HCL INJECTION 0.25 MG/5ML
0.2500 mg | Freq: Once | INTRAVENOUS | Status: AC
  Administered 2019-02-13: 0.25 mg via INTRAVENOUS
  Filled 2019-02-13: qty 5

## 2019-02-13 MED ORDER — SODIUM CHLORIDE 0.9 % IV SOLN
Freq: Once | INTRAVENOUS | Status: AC
  Administered 2019-02-13: 12:00:00 via INTRAVENOUS
  Filled 2019-02-13: qty 250

## 2019-02-13 MED ORDER — ATROPINE SULFATE 1 MG/ML IJ SOLN
0.5000 mg | Freq: Once | INTRAMUSCULAR | Status: AC
  Administered 2019-02-13: 0.5 mg via INTRAVENOUS
  Filled 2019-02-13: qty 1

## 2019-02-13 MED ORDER — IRINOTECAN HCL CHEMO INJECTION 100 MG/5ML
125.0000 mg/m2 | Freq: Once | INTRAVENOUS | Status: AC
  Administered 2019-02-13: 240 mg via INTRAVENOUS
  Filled 2019-02-13: qty 10

## 2019-02-13 MED ORDER — HEPARIN SOD (PORK) LOCK FLUSH 100 UNIT/ML IV SOLN
500.0000 [IU] | Freq: Once | INTRAVENOUS | Status: DC
  Filled 2019-02-13: qty 5

## 2019-02-13 MED ORDER — HEPARIN SOD (PORK) LOCK FLUSH 100 UNIT/ML IV SOLN: 500.0000 [IU] | Freq: Once | INTRAVENOUS | Status: DC | PRN

## 2019-02-13 NOTE — Progress Notes (Signed)
No new changes noted today 

## 2019-02-13 NOTE — Progress Notes (Signed)
° ° °[facility] [place] [facility]     °[address], [address]     °[place], [place] [address]     °Phone: [phone]     ° Fax: [phone]     °  ° °Clinic day:  02/13/2019 ° °Chief Complaint: Latoya Jones is a 72 y.o. female with stage IV colon cancer who is seen for assessment prior to cycle #10 FOLFIRI + Avastin. ° °HPI:  The patient was last seen in the medical oncology clinic on 01/22/2019 by Bryan Gray, NP.  At that time, she was doing well overall.  She denied any acute complaints.  She had been seen in consult by cardiology for further evaluation of her asymptomatic bradycardia; work-up was negative.  Caregiver with concerns about afternoon combativeness and patient sitting a lot.  Eating well; weight up 1 pound.  Exam revealed stage I pressure related skin damage to sacrum, and chronic swelling to bilateral ankles.  WBC 6100 (ANC 3900).  Hemoglobin 11.4, hematocrit 35.5, MCV 102.9, and platelets 160,000.  B12 was 2448 and folate 45.  TSH was 1.627 (normal). ° °She received cycle #9 FOLFIRI + Avastin with Neulasta support. ° °Symptomatically, patient feels well. She is making efforts to be more active at home. Patient is walking around more and doing chores. She has been tolerating her treatments well for the most part. She denies any nausea, vomiting, or pain. She has had some diarrhea. Shortness of breath and cough has improved. Patient denies that she has experienced any B symptoms. She denies any interval infections.  ° °Patient advises that she maintains an adequate appetite. She is eating well. Weight today is 167 lb 14.1 oz (76.1 kg), which compared to her last visit to the clinic, represents a 3 pound increase.   ° °Patient denies pain in the clinic today. ° ° °Past Medical History:  °Diagnosis Date  °• Colon cancer (HCC)   °• Hypertension   ° ° °Past Surgical History:  °Procedure Laterality Date  °• COLOSTOMY REVISION N/A 12/06/2017  ° Procedure: COLON RESECTION RIGHT;  Surgeon: Cooper,  Richard E, MD;  Location: [facility] ORS;  Service: General;  Laterality: N/A;  °• DILATION AND CURETTAGE, DIAGNOSTIC / THERAPEUTIC    °• PORTACATH PLACEMENT N/A 01/06/2018  ° Procedure: INSERTION PORT-A-CATH;  Surgeon: Davis, Jason Evan, MD;  Location: [facility] ORS;  Service: General;  Laterality: N/A;  ° ° °Family History  °Problem Relation Age of Onset  °• ALS Mother   °• ALS Father   ° ° °Social History:  reports that she quit smoking about 11 years ago. She has never used smokeless tobacco. She reports that she does not drink alcohol or use drugs.  She lives in a family care home; generally accompanied by Latoya Jones. She has her own room at the care home.  The patient is alone today.  ° °Allergies: No Known Allergies ° °Current Medications: °Current Outpatient Medications  °Medication Sig Dispense Refill  °• acetaminophen (TYLENOL) 500 MG tablet Take 1,000 mg by mouth every 8 (eight) hours as needed for mild pain.    °• alendronate (FOSAMAX) 70 MG tablet Take 70 mg by mouth once a week.    °• amLODipine (NORVASC) 10 MG tablet Take 10 mg by mouth daily.     °• ARIPiprazole (ABILIFY) 15 MG tablet Take 7.5 mg by mouth daily.    °• atorvastatin (LIPITOR) 20 MG tablet Take 1 tablet by mouth daily.    °• calcium carbonate (TUMS - DOSED   DOSED IN MG ELEMENTAL CALCIUM) 500 MG chewable tablet Chew 1 tablet by mouth 2 (two) times daily as needed for indigestion or heartburn.     Calcium Carbonate-Vitamin D3 (CALCIUM 600-D) 600-400 MG-UNIT TABS Take 1 tablet by mouth daily.     lidocaine-prilocaine (EMLA) cream Apply 1 application topically as needed. 1 hour prior to  each chemotherapy treatment. Place small amount over port site and place saran wrap over the cream to protect clothing 30 g 0   loratadine (CLARITIN) 10 MG tablet Take 1 tablet (10 mg total) by mouth daily as needed for allergies. Take 1 tablet the day before pump removal, 1 tablet the day of pump removal and 1 tablet the day after pump removal. Take as directed with  each cycle of  chemotherapy treatment 30 tablet 1   memantine (NAMENDA XR) 14 MG CP24 24 hr capsule Take 14 mg by mouth at bedtime.      metoprolol succinate (TOPROL-XL) 50 MG 24 hr tablet Take 25 mg by mouth every morning. Take with or immediately following a meal.      Multiple Vitamins-Minerals (SENTRY SENIOR PO) Take 1 tablet by mouth daily.     ondansetron (ZOFRAN) 8 MG tablet Take 1 tablet (8 mg total) by mouth 2 (two) times daily as needed for nausea or vomiting. Can start using on day 3 after chemo 20 tablet 0   perphenazine (TRILAFON) 4 MG tablet Take 4 mg by mouth 2 (two) times daily.     prochlorperazine (COMPAZINE) 10 MG tablet Take 1 tablet (10 mg total) by mouth every 6 (six) hours as needed for nausea or vomiting. 30 tablet 0   No current facility-administered medications for this visit.    Facility-Administered Medications Ordered in Other Visits  Medication Dose Route Frequency Provider Last Rate Last Dose   dextrose 5 % solution   Intravenous Once Sindy Guadeloupe, MD       fluorouracil (ADRUCIL) 4,800 mg in sodium chloride 0.9 % 54 mL chemo infusion  2,400 mg/m2 (Treatment Plan Recorded) Intravenous 1 day or 1 dose Latoya Oatley C, MD       heparin lock flush 100 unit/mL  500 Units Intravenous Once Sindy Guadeloupe, MD       heparin lock flush 100 unit/mL  500 Units Intracatheter Once PRN Sindy Guadeloupe, MD       heparin lock flush 100 unit/mL  500 Units Intravenous Once Sindy Guadeloupe, MD       heparin lock flush 100 unit/mL  500 Units Intravenous Once [name], MD       heparin lock flush 100 unit/mL  500 Units Intracatheter Once PRN Lequita Asal, MD       sodium chloride flush (NS) 0.9 % injection 10 mL  10 mL Intravenous PRN Sindy Guadeloupe, MD   10 mL at 02/06/18 0845   sodium chloride flush (NS) 0.9 % injection 10 mL  10 mL Intracatheter PRN Sindy Guadeloupe, MD       sodium chloride flush (NS) 0.9 % injection 10 mL  10 mL Intracatheter PRN  Sindy Guadeloupe, MD   10 mL at 03/06/18 0900   sodium chloride flush (NS) 0.9 % injection 10 mL  10 mL Intravenous PRN Sindy Guadeloupe, MD   10 mL at 03/27/18 0909   sodium chloride flush (NS) 0.9 % injection 10 mL  10 mL Intravenous PRN Lequita Asal, MD   10 mL at 02/13/19 [phone]  sodium chloride flush (NS) 0.9 % injection 10 mL  10 mL Intracatheter PRN ,  C, MD      ° ° °Review of Systems  °Constitutional: Negative for diaphoresis, fever, malaise/fatigue and weight loss (up 3 pounds).  °     "I am doing good. I am walking around more and doing chores".  °HENT: Negative.   °Eyes: Negative.   °Respiratory: Negative for cough, hemoptysis, sputum production and shortness of breath.   °Cardiovascular: Positive for leg swelling. Negative for chest pain, palpitations, orthopnea and PND.  °     Asymptomatic bradycardia  °Gastrointestinal: Negative for abdominal pain, blood in stool, constipation, diarrhea, melena, nausea and vomiting.  °Genitourinary: Negative for dysuria, frequency, hematuria and urgency.  °Musculoskeletal: Negative for back pain, falls, joint pain and myalgias.  °Skin: Negative for itching and rash.  °     Skin breakdown to sacrum; improving  °Neurological: Negative for dizziness, tremors, weakness and headaches.  °Endo/Heme/Allergies: Does not bruise/bleed easily.  °Psychiatric/Behavioral: Positive for depression (improving) and memory loss. Negative for suicidal ideas. The patient is not nervous/anxious and does not have insomnia.   °     (+) schizophrenia  °All other systems reviewed and are negative. ° °Performance status (ECOG): 2 - Symptomatic, <50% confined to bed ° °Vital Signs °BP 134/71 (BP Location: Right Arm, Patient Position: Sitting)    Pulse (!) 53    Temp 98.3 °F (36.8 °Jones) (Tympanic)    Resp 18    Ht 5' 5" (1.651 m)    Wt 167 lb 14.1 oz (76.1 kg)    SpO2 98%    BMI 27.94 kg/m²  ° °Physical Exam  °Constitutional: She is oriented to person, place, and time and  well-developed, well-nourished, and in no distress.  °HENT:  °Head: Normocephalic and atraumatic.  °Mouth/Throat: Oropharynx is clear and moist and mucous membranes are normal. Abnormal dentition (edentulous).  °Wearing a black hat with sparkles.  Short graying hair.  Wearing a mask.  °Eyes: Pupils are equal, round, and reactive to light. Conjunctivae and EOM are normal. No scleral icterus.  °Glasses.  Blue eyes.  °Neck: Normal range of motion. Neck supple. No JVD present.  °Cardiovascular: Regular rhythm, normal heart sounds and intact distal pulses. Bradycardia present. Exam reveals no gallop and no friction rub.  °No murmur heard. °Pulmonary/Chest: Effort normal and breath sounds normal. No respiratory distress. She has no wheezes. She has no rales.  °Abdominal: Soft. Bowel sounds are normal. She exhibits no distension and no mass. There is no abdominal tenderness.  °Musculoskeletal: Normal range of motion.     °   General: Edema (2+ BLE) present. No tenderness.  °Lymphadenopathy:  °  She has no cervical adenopathy.  °  She has no axillary adenopathy.  °     Right: No supraclavicular adenopathy present.  °     Left: No supraclavicular adenopathy present.  °Neurological: She is alert and oriented to person, place, and time. Gait normal.  °Skin: Skin is warm and dry. No rash noted. No erythema. No pallor.  °Stage 1 pressure related skin breakdown; skin intact; minimal erythema.   °Psychiatric: Mood and judgment normal. Her mood appears not anxious. She does not exhibit a depressed mood. She expresses no homicidal and no suicidal ideation. She exhibits abnormal recent memory and abnormal remote memory.  °Nursing note and vitals reviewed. ° ° °Imaging studies: °12/03/2017:  Abdomen and pelvic CT revealed a large cecal mass causing secondary appendicitis.  °12/03/2017:  CT chest   without contrast revealed tiny 2 mm right upper lobe pulmonary nodule.  04/20/2018:  Chest, abdomen, and pelvic CT revealed peritoneal  nodularity within the right pericolic gutter which may represent postsurgical change and/or residual disease. There was a soft tissue mass which appeared to be involving the distal small bowel within the right lower quadrant which may represent residual/recurrent disease. There was soft tissue irregularity and fluid within the right adnexa which may represent postsurgical changes or residual/recurrent disease. 07/27/2018:  Chest, abdomen and pelvic CT revealed interval increase in size of right hepatic lobe lesion concerning hepatic metastatic disease.  There was similar-appearing nodularity within the right pericolic gutter which may represent metastatic disease or postsurgical changes.  There was similar-appearing soft tissue mass within the distal small bowel concerning for residual disease.  There was stable bilateral adrenal nodules.  There was interval development of patchy ground-glass opacities predominantly within the upper lobes bilaterally, favored to be infectious/inflammatory in etiology.  10/24/2018:  Chest, abdomen, and pelvic CT revealed no pulmonary metastatic disease.  There was slight interval decrease in size of the right hepatic lobe metastatic lesion.  There were no new lesions or progressive findings.  There was stable soft tissue lesions involving the colon near the anastomosis and also 2 separate right lower quadrant small bowel loops.  There were no new or progressive findings.  There were stable small scattered adjacent mesenteric nodes.   Infusion on 02/13/2019  Component Date Value Ref Range Status   Magnesium 02/13/2019 2.0  1.7 - 2.4 mg/dL Final   Performed at [facility], [address] [place], [place] [address]   Sodium 02/13/2019 135  135 - 145 mmol/L Final   Potassium 02/13/2019 3.9  3.5 - 5.1 mmol/L Final   Chloride 02/13/2019 106  98 - 111 mmol/L Final   CO2 02/13/2019 24  22 - 32 mmol/L Final   Glucose, Bld 02/13/2019 126* 70 - 99 mg/dL Final     BUN 02/13/2019 10  8 - 23 mg/dL Final   Creatinine, Ser 02/13/2019 0.60  0.44 - 1.00 mg/dL Final   Calcium 02/13/2019 9.0  8.9 - 10.3 mg/dL Final   Total Protein 02/13/2019 6.4* 6.5 - 8.1 g/dL Final   Albumin 02/13/2019 3.7  3.5 - 5.0 g/dL Final   AST 02/13/2019 23  15 - 41 U/L Final   ALT 02/13/2019 19  0 - 44 U/L Final   Alkaline Phosphatase 02/13/2019 71  38 - 126 U/L Final   Total Bilirubin 02/13/2019 0.7  0.3 - 1.2 mg/dL Final   GFR calc non Af Amer 02/13/2019 >60  >60 mL/min Final   GFR calc Af Amer 02/13/2019 >60  >60 mL/min Final   Anion gap 02/13/2019 5  5 - 15 Final   Performed at [facility], [address], [place], [place] [address]   WBC 02/13/2019 5.9  4.0 - 10.5 K/uL Final   RBC 02/13/2019 3.52* 3.87 - 5.11 MIL/uL Final   Hemoglobin 02/13/2019 11.4* 12.0 - 15.0 g/dL Final   HCT 02/13/2019 35.5* 36.0 - 46.0 % Final   MCV 02/13/2019 100.9* 80.0 - 100.0 fL Final   MCH 02/13/2019 32.4  26.0 - 34.0 pg Final   MCHC 02/13/2019 32.1  30.0 - 36.0 g/dL Final   RDW 02/13/2019 15.5  11.5 - 15.5 % Final   Platelets 02/13/2019 159  150 - 400 K/uL Final   nRBC 02/13/2019 0.0  0.0 - 0.2 % Final   Neutrophils Relative % 02/13/2019  60  % Final  °• Neutro Abs 02/13/2019 3.5  1.7 - 7.7 K/uL Final  °• Lymphocytes Relative 02/13/2019 26  % Final  °• Lymphs Abs 02/13/2019 1.5  0.7 - 4.0 K/uL Final  °• Monocytes Relative 02/13/2019 11  % Final  °• Monocytes Absolute 02/13/2019 0.6  0.1 - 1.0 K/uL Final  °• Eosinophils Relative 02/13/2019 1  % Final  °• Eosinophils Absolute 02/13/2019 0.1  0.0 - 0.5 K/uL Final  °• Basophils Relative 02/13/2019 2  % Final  °• Basophils Absolute 02/13/2019 0.1  0.0 - 0.1 K/uL Final  °• Immature Granulocytes 02/13/2019 0  % Final  °• Abs Immature Granulocytes 02/13/2019 0.02  0.00 - 0.07 K/uL Final  ° Performed at [facility], [address] [place], [place] [address]  °• Total Protein, Urine 02/13/2019 NEGATIVE   mg/dL Final  ° Performed at [facility], [address] [place], [place] [address]  ° ° °Assessment:  Natalyn Kannan is a 72 y.o. female with stage IV colon cancer with peritoneal metastasis and possible liver metastasis.  She is s/p exploratory laparotomy with right hemicolectomy and terminal ileum resection and separate small bowel resection on 12/06/2017.  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  Radial margin was positive for invasive carcinoma.  There were 2 colon adenomas (2.5 and 1.8 cm) of the ascending colon.  Metastatic adenocarcinoma was seen in 4 of 13 lymph nodes.  There were 2 tumor deposits.  There was a segment of small intestine with abscess which also showed adenocarcinoma invading the distal appendix with perforation.  One mesenteric lymph node was negative for malignancy.  Retroperitoneal tumor removal was positive for mucinous adenocarcinoma.  Proximal and distal margins were negative.  Pathologic stage was pT4b pN2a. MSI stable. KRAS mutation was positive ° °She received 5 cycles of FOLFOX (01/10/2018 - 03/27/2018).  She began Neulasta with cycle #2.  Oxaliplatin was decreased from 85 mg/m2 to 65 mg/m2 with cycle #4.  She received 6 cycles of 5FU + LV with Avastin (05/01/2018 - 07/17/2018). ° °She is s/p 9 cycles of FOLIFIRI + Avastin (07/31/2018 - 01/22/2019).  She receives chemotherapy every 3 weeks secondary to tolerance.  Irinotecan was decreased from 150 mg/m2 to 125 mg/m2 with cycle #2.  5FU bolus was discontinued with cycle #6. ° °Chest, abdomen, and pelvic CT on 10/24/2018 revealed no pulmonary metastatic disease.  There was slight interval decrease in size of the right hepatic lobe metastatic lesion. There were no new lesions or progressive findings.  There was stable soft tissue lesions involving the colon near the anastomosis and also 2 separate right lower quadrant small bowel loops.  There was no new  or progressive findings.  There were stable small scattered adjacent mesenteric nodes. ° °CEA has been followed: 4.8 on 12/04/2017, 3.1 on 01/10/2018, 4.2 on 03/06/2018, 3.8 on 05/01/2018, 9.2 on 07/31/2018, 5.9 on 09/18/2018, and 5.0 on 10/30/2018. ° °She has macrocytic RBC indices.  She has a history of a low B12 level.  B12 was 266 on 01/30/2018 and 2448 on 01/22/2019.  Folate was 32 on 01/30/2018 and 45 on 01/22/2019.  TSH was 0.913 on 12/03/2017 and 1.627 on 01/22/2019. ° °She has paranoid schizophrenia with delusions.  She lives in a group home. ° °Symptomatically, she is doing well overall.  She denies any acute concerns.  No B symptoms or recent infections.  Continues to tolerate treatment well with no side effects; denies nausea, vomiting, and changes to bowel   habits.  Patient is eating well; weight up 3 pounds.  Exam reveals chronic peripheral edema. Area of skin breakdown to sacrum continues to improve.  WBC 5900 (ANC 3500).  Hemoglobin 11.4, hematocrit 35.5, MCV 100.9, and platelets 159,000 ° °Plan: °1. Labs today:  CBC with diff, CMP, Mg, urine protein. °2. Stage IV colon cancer °· Doing well overall.  No gastrointestinal symptoms or bleeding.  No pain. °· Discuss plans for repeat imaging prior to next cycle. °· CT of the chest, abdomen, and pelvis with contrast scheduled for 03/02/2019. °· Labs reviewed. Blood counts stable and adequate enough for treatment. Will proceed with cycle #10 FOLFIRI + Avastin with Neulasta support. °Patient has antiemetics at home to use on a PRN basis, and advises that the prescribed interventions are adequately managing her symptoms at this point.   °3. Venous access device (Port) concerns °· Issues with blood return from port.  °· TPA dwell unsuccessful.  °· CXR performed - device tip in correct position.  °· IVF challenge - 250 cc able to infuse via gravity with no issues.  °· Radiology advising that formal port dye study under fluoroscopy could be considered for further  evaluation if clinically warranted.  °· Will proceed with IV chemotherapy dose as planned.  °4. Macrocytic anemia °· Review labs.  Hemoglobin 11.4, hematocrit 35.5, and MCV 100.9 today. °· Review labs from 01/01/2019. B12 elevated at 2766 pg/mL TSH and folate normal. °· Continue routine lab monitoring. °5. Asymptomatic bradycardia °· Heart rate remains low at 53 bpm today.  °· Remains asymptomatic. °· Patient has been recently seen in consult by cardiology (Khan, MD).  °· Beta-blocker (metoprolol) dose has been decreased from 50 mg to 25 mg daily. °6. Stage I sacral decubitus °· Stable to improved with no signs of progression. °· Encourage patient to continue to increase activity level and to utilize pressure reducing cushion to prevent exacerbation. °7. Psychological concerns °· Patient has underlying schizophrenia diagnosis. °· ? dementia component.  °· (+) forgetfulness and combativeness in the late afternoon; ? sundowning.  °· Followed by psychiatry with recent medication changes. °· Encourage follow-up with psychiatry for ongoing management and evaluation. °8. RTC in 3 weeks for MD assessment, labs (CBC with diff, CMP, Mg, urine protein, CEA), and cycle #11 FOLFIRI + Avastin. ° ° °Bryan Gray, NP  °02/13/2019, 10:55 AM  ° °I saw and evaluated the patient, participating in the key portions of the service and reviewing pertinent diagnostic studies and records.  I reviewed the nurse practitioner's note and agree with the findings and the plan.  The assessment and plan were discussed with the patient.  Additional diagnostic studies of CT scans are needed to clarify disease status and would change the clinical management.  Several questions were asked by the patient and answered. ° ° ° , MD °02/13/2019,10:55 AM   ° °

## 2019-02-13 NOTE — Progress Notes (Signed)
Unable to get blood return from port. Flushes easily. Cath-flo given at [id number]. Labs drawn via peripheral vein. 1100 no blood return after cath -flo. NP notified. Will order dye study. Unable to perform dye study in [place]. CXR ordered for placement. Per NP proceed with chemotherapy today. NS flows freely off the pump into port. No swelling redness or discomfort.

## 2019-02-14 LAB — CEA: CEA: 5 ng/mL — ABNORMAL HIGH (ref 0.0–4.7)

## 2019-02-15 ENCOUNTER — Inpatient Hospital Stay: Payer: Medicare Other

## 2019-02-15 ENCOUNTER — Other Ambulatory Visit: Payer: Self-pay

## 2019-02-15 DIAGNOSIS — Z5112 Encounter for antineoplastic immunotherapy: Secondary | ICD-10-CM | POA: Diagnosis not present

## 2019-02-15 DIAGNOSIS — C189 Malignant neoplasm of colon, unspecified: Secondary | ICD-10-CM

## 2019-02-15 MED ORDER — SODIUM CHLORIDE 0.9% FLUSH
10.0000 mL | INTRAVENOUS | Status: DC | PRN
  Administered 2019-02-15: 10 mL
  Filled 2019-02-15: qty 10

## 2019-02-15 MED ORDER — HEPARIN SOD (PORK) LOCK FLUSH 100 UNIT/ML IV SOLN
500.0000 [IU] | Freq: Once | INTRAVENOUS | Status: AC | PRN
  Administered 2019-02-15: 500 [IU]

## 2019-02-15 MED ORDER — PEGFILGRASTIM INJECTION 6 MG/0.6ML ~~LOC~~
6.0000 mg | PREFILLED_SYRINGE | Freq: Once | SUBCUTANEOUS | Status: AC
  Administered 2019-02-15: 6 mg via SUBCUTANEOUS

## 2019-02-15 NOTE — Progress Notes (Signed)
Chemo pump discontinued this afternoon. RN obtained great blood return from port this afternoon. WE were unable to get a blood return from port on Tuesday.

## 2019-02-22 ENCOUNTER — Encounter: Payer: Self-pay | Admitting: Cardiovascular Disease

## 2019-03-02 ENCOUNTER — Other Ambulatory Visit: Payer: Self-pay

## 2019-03-02 ENCOUNTER — Ambulatory Visit
Admission: RE | Admit: 2019-03-02 | Discharge: 2019-03-02 | Disposition: A | Discharge status: Home / Self Care | Payer: Medicare Other | Source: Ambulatory Visit | Attending: Urgent Care | Admitting: Urgent Care

## 2019-03-02 DIAGNOSIS — C189 Malignant neoplasm of colon, unspecified: Secondary | ICD-10-CM | POA: Insufficient documentation

## 2019-03-02 DIAGNOSIS — C787 Secondary malignant neoplasm of liver and intrahepatic bile duct: Secondary | ICD-10-CM | POA: Insufficient documentation

## 2019-03-02 MED ORDER — IOHEXOL 300 MG/ML  SOLN
100.0000 mL | Freq: Once | INTRAMUSCULAR | Status: AC | PRN
  Administered 2019-03-02: 100 mL via INTRAVENOUS

## 2019-03-03 ENCOUNTER — Other Ambulatory Visit: Payer: Self-pay | Admitting: Hematology and Oncology

## 2019-03-03 NOTE — Progress Notes (Signed)
DISCONTINUE ON PATHWAY REGIMEN - Colorectal     A cycle is every 14 days:     Oxaliplatin      Leucovorin      5-Fluorouracil      5-Fluorouracil   **Always confirm dose/schedule in your pharmacy ordering system**  REASON: Disease Progression PRIOR TREATMENT: COS67: [id number] q14 Days x 6 Months TREATMENT RESPONSE: Partial Response (PR)    Patient Characteristics: Distant Metastases, Second Line, KRAS Mutation Positive/Unknown, BRAF Wild-Type/Unknown Therapeutic Status: Distant Metastases BRAF Mutation Status: Awaiting Test Results KRAS/NRAS Mutation Status: Mutation Positive Line of Therapy: Second Line

## 2019-03-05 ENCOUNTER — Other Ambulatory Visit: Payer: Self-pay

## 2019-03-05 DIAGNOSIS — C786 Secondary malignant neoplasm of retroperitoneum and peritoneum: Secondary | ICD-10-CM | POA: Insufficient documentation

## 2019-03-05 NOTE — Progress Notes (Signed)
[facility]  [address] [place],  [address] Phone: [phone]  Fax: [phone]   Telemedicine Office Visit:  03/06/2019  Referring physician: Lin Givens, MD  I connected with Latoya Jones on 03/06/19 at 9:55 AM EDT by videoconferencing and verified that I was speaking with the correct person using 2 identifiers.  The patient was at Ms [name]'s office.  I discussed the limitations, risk, security and privacy concerns of performing an evaluation and management service by videoconferencing and the availability of in person appointments.  I also discussed with the patient that there may be a patient responsible charge related to this service.  The patient expressed understanding and agreed to proceed.    Chief Complaint: Latoya Jones is a 73 y.o. female with stage IV colon cancer who is seen for review of interval CT scan and discussion regarding direction of therapy.  HPI:  The patient was last seen in the medical oncology clinic on 02/13/2019.  At that time, she was doing well overall.  She denied any acute concerns.  No B symptoms or recent infections.  She continued to tolerate treatment well with no apparent side effects.   She was eating well; weight up 3 pounds.  Exam revealed chronic peripheral edema.   The area of skin breakdown to sacrum continued to improve.  WBC was 5900 ([place] 3500).  Hemoglobin 11.4, hematocrit 35.5, MCV 100.9, and platelets 159,000.  She received  Chest, abdomen, and pelvic CT on 03/02/2019 revealed progressive disease within the abdomen and pelvis. There was increase in soft tissue thickening along the surgical site at the ileocolic junction (2.9 x 1.9 cm to 4.6 x 2.5 cm).  There was new and progressive peritoneal metastasis (2.1 x 2.4 cm).  There was no bowel obstruction or other acute complication.  There was enlarging soft tissue metastasis on the right pelvic oblique musculature (1.1 cm to 1.7 cm).  During  the interim,  She has felt "pretty good".  She notes some issues with constipation.  She recently took some prune juice, but has not tried laxatives or stool softeners.  She denies any abdominal pain, nausea or vomiting.  She denies any skin breakdown on her bottom.  Her home care team has been applying zinc based cream to the area.  There is no concern for worsening skin breakdown.   Past Medical History:  Diagnosis Date  . Colon cancer ([place])   . Hypertension     Past Surgical History:  Procedure Laterality Date  . COLOSTOMY REVISION N/A 12/06/2017   Procedure: COLON RESECTION RIGHT;  Surgeon: Florene Glen, MD;  Location: [facility] ORS;  Service: General;  Laterality: N/A;  . DILATION AND CURETTAGE, DIAGNOSTIC / THERAPEUTIC    . PORTACATH PLACEMENT N/A 01/06/2018   Procedure: INSERTION PORT-A-CATH;  Surgeon: Vickie Epley, MD;  Location: [facility] ORS;  Service: General;  Laterality: N/A;    Family History  Problem Relation Age of Onset  . ALS Mother   . ALS Father     Social History:  reports that she quit smoking about 11 years ago. She has never used smokeless tobacco. She reports that she does not drink alcohol or use drugs.  She lives in a family care home; generally accompanied by [name]. She has her own room at the care home.   Participants in the patient's visit and their role in the encounter included the patient, [name] (family care facility manager), and Vito Berger, CMA, today.  The intake visit was provided by Vito Berger, CMA.    Allergies: No Known Allergies  Current Medications: Current Outpatient Medications  Medication Sig Dispense Refill  . alendronate (FOSAMAX) 70 MG tablet Take 70 mg by mouth once a week.    Marland Kitchen amLODipine (NORVASC) 10 MG tablet Take 10 mg by mouth daily.     . ARIPiprazole (ABILIFY) 15 MG tablet Take 7.5 mg by mouth daily.    Marland Kitchen atorvastatin (LIPITOR) 20 MG tablet Take 1 tablet by mouth daily.    . Calcium Carbonate-Vitamin  D3 (CALCIUM 600-D) 600-400 MG-UNIT TABS Take 1 tablet by mouth daily.    Marland Kitchen loratadine (CLARITIN) 10 MG tablet Take 1 tablet (10 mg total) by mouth daily as needed for allergies. Take 1 tablet the day before pump removal, 1 tablet the day of pump removal and 1 tablet the day after pump removal. Take as directed with each cycle of  chemotherapy treatment 30 tablet 1  . memantine (NAMENDA XR) 14 MG CP24 24 hr capsule Take 14 mg by mouth at bedtime.     . metoprolol succinate (TOPROL-XL) 50 MG 24 hr tablet Take 25 mg by mouth every morning. Take with or immediately following a meal.     . Multiple Vitamins-Minerals (SENTRY SENIOR PO) Take 1 tablet by mouth daily.    Marland Kitchen perphenazine (TRILAFON) 4 MG tablet Take 4 mg by mouth 2 (two) times daily.    Marland Kitchen acetaminophen (TYLENOL) 500 MG tablet Take 1,000 mg by mouth every 8 (eight) hours as needed for mild pain.    . calcium carbonate (TUMS - DOSED IN MG ELEMENTAL CALCIUM) 500 MG chewable tablet Chew 1 tablet by mouth 2 (two) times daily as needed for indigestion or heartburn.    . lidocaine-prilocaine (EMLA) cream Apply 1 application topically as needed. 1 hour prior to  each chemotherapy treatment. Place small amount over port site and place saran wrap over the cream to protect clothing (Patient not taking: Reported on 03/06/2019) 30 g 0  . ondansetron (ZOFRAN) 8 MG tablet Take 1 tablet (8 mg total) by mouth 2 (two) times daily as needed for nausea or vomiting. Can start using on day 3 after chemo (Patient not taking: Reported on 03/06/2019) 20 tablet 0  . prochlorperazine (COMPAZINE) 10 MG tablet Take 1 tablet (10 mg total) by mouth every 6 (six) hours as needed for nausea or vomiting. (Patient not taking: Reported on 03/06/2019) 30 tablet 0   No current facility-administered medications for this visit.    Facility-Administered Medications Ordered in Other Visits  Medication Dose Route Frequency Provider Last Rate Last Dose  . heparin lock flush 100 unit/mL  500  Units Intravenous Once Sindy Guadeloupe, MD      . heparin lock flush 100 unit/mL  500 Units Intravenous Once Sindy Guadeloupe, MD      . sodium chloride flush (NS) 0.9 % injection 10 mL  10 mL Intravenous PRN Sindy Guadeloupe, MD   10 mL at 02/06/18 0845  . sodium chloride flush (NS) 0.9 % injection 10 mL  10 mL Intravenous PRN Sindy Guadeloupe, MD   10 mL at 03/27/18 0909    Review of Systems  Constitutional: Negative for chills, diaphoresis, fever, malaise/fatigue and weight loss (stable).       Feels "pretty good".  HENT: Negative.  Negative for congestion, ear discharge, ear pain, nosebleeds, sinus pain and sore throat.   Eyes: Negative.  Negative for blurred vision, double vision and  photophobia.  Respiratory: Negative.  Negative for cough, hemoptysis, sputum production and shortness of breath.   Cardiovascular: Negative for chest pain, palpitations, orthopnea, leg swelling (no change) and PND.       H/o asymptomatic bradycardia- "ok" per cardiology.  Gastrointestinal: Positive for constipation. Negative for abdominal pain, blood in stool, diarrhea, melena, nausea and vomiting.       Eating well.  Genitourinary: Negative.  Negative for dysuria, frequency, hematuria and urgency.  Musculoskeletal: Negative.  Negative for back pain, falls, joint pain, myalgias and neck pain.  Skin: Negative for itching and rash.       Skin breakdown to sacrum, stable to improved.  Neurological: Negative for dizziness, tingling, tremors, sensory change, speech change, focal weakness, weakness and headaches.  Endo/Heme/Allergies: Negative.  Does not bruise/bleed easily.  Psychiatric/Behavioral: Positive for memory loss. Negative for depression. The patient is not nervous/anxious and does not have insomnia.        Schizophrenia.  All other systems reviewed and are negative.  Performance status (ECOG): 2  Vital Signs There were no vitals taken for this visit.  Physical Exam  Constitutional: She is oriented to  person, place, and time and well-developed, well-nourished, and in no distress.  HENT:  Head: Normocephalic and atraumatic.  Mouth/Throat: Mucous membranes are normal.  Wearing a cap.  Short graying hair.  Eyes: Conjunctivae are normal. No scleral icterus.  Glasses.  Blue eyes.  Neurological: She is alert and oriented to person, place, and time.  Skin: She is not diaphoretic.  Psychiatric: Mood and judgment normal. Her mood appears not anxious. She does not exhibit a depressed mood.    Imaging studies: 12/03/2017:  Abdomen and pelvic CT revealed a large cecal mass causing secondary appendicitis. 12/03/2017:  CT chest without contrast revealed tiny 2 mm right upper lobe pulmonary nodule.  04/20/2018:  Chest, abdomen, and pelvic CT revealed peritoneal nodularity within the right pericolic gutter which may represent postsurgical change and/or residual disease. There was a soft tissue mass which appeared to be involving the distal small bowel within the right lower quadrant which may represent residual/recurrent disease. There was soft tissue irregularity and fluid within the right adnexa which may represent postsurgical changes or residual/recurrent disease. 07/27/2018:  Chest, abdomen and pelvic CT revealed interval increase in size of right hepatic lobe lesion concerning hepatic metastatic disease.  There was similar-appearing nodularity within the right pericolic gutter which may represent metastatic disease or postsurgical changes.  There was similar-appearing soft tissue mass within the distal small bowel concerning for residual disease.  There was stable bilateral adrenal nodules.  There was interval development of patchy ground-glass opacities predominantly within the upper lobes bilaterally, favored to be infectious/inflammatory in etiology.  10/24/2018:  Chest, abdomen, and pelvic CT revealed no pulmonary metastatic disease.  There was slight interval decrease in size of the right hepatic  lobe metastatic lesion.  There were no new lesions or progressive findings.  There was stable soft tissue lesions involving the colon near the anastomosis and also 2 separate right lower quadrant small bowel loops.  There were no new or progressive findings.  There were stable small scattered adjacent mesenteric nodes. 03/02/2019:  Chest, abdomen, and pelvic CT revealed progressive disease within the abdomen and pelvis. There was increase in soft tissue thickening along the surgical site at the ileocolic junction (2.9 x 1.9 cm to 4.6 x 2.5 cm).  There was new and progressive peritoneal metastasis (2.1 x 2.4 cm).  There was no bowel obstruction or  other acute complication.  There was enlarging soft tissue metastasis on the right pelvic oblique musculature (1.1 cm to 1.7 cm).   No visits with results within 3 Day(s) from this visit.  Latest known visit with results is:  Infusion on 02/13/2019  Component Date Value Ref Range Status  . CEA 02/13/2019 5.0* 0.0 - 4.7 ng/mL Final   Comment: (NOTE)                             Nonsmokers          <3.9                             Smokers             <5.6 Roche [employer] Electrochemiluminescence Immunoassay (ECLIA) Values obtained with different assay methods or kits cannot be used interchangeably.  Results cannot be interpreted as absolute evidence of the presence or absence of malignant disease. Performed At: [facility] [place], [place] [id number] Rush Farmer MD LK:[id number]   . Magnesium 02/13/2019 2.0  1.7 - 2.4 mg/dL Final   Performed at [facility], [address], [place], [place] [address]  . Sodium 02/13/2019 135  135 - 145 mmol/L Final  . Potassium 02/13/2019 3.9  3.5 - 5.1 mmol/L Final  . Chloride 02/13/2019 106  98 - 111 mmol/L Final  . CO2 02/13/2019 24  22 - 32 mmol/L Final  . Glucose, Bld 02/13/2019 126* 70 - 99 mg/dL Final  . BUN 02/13/2019 10  8 - 23 mg/dL Final  . Creatinine, Ser  02/13/2019 0.60  0.44 - 1.00 mg/dL Final  . Calcium 02/13/2019 9.0  8.9 - 10.3 mg/dL Final  . Total Protein 02/13/2019 6.4* 6.5 - 8.1 g/dL Final  . Albumin 02/13/2019 3.7  3.5 - 5.0 g/dL Final  . AST 02/13/2019 23  15 - 41 U/L Final  . ALT 02/13/2019 19  0 - 44 U/L Final  . Alkaline Phosphatase 02/13/2019 71  38 - 126 U/L Final  . Total Bilirubin 02/13/2019 0.7  0.3 - 1.2 mg/dL Final  . GFR calc non Af Amer 02/13/2019 >60  >60 mL/min Final  . GFR calc Af Amer 02/13/2019 >60  >60 mL/min Final  . Anion gap 02/13/2019 5  5 - 15 Final   Performed at [facility], [address] [place], [place] [address]  . WBC 02/13/2019 5.9  4.0 - 10.5 K/uL Final  . RBC 02/13/2019 3.52* 3.87 - 5.11 MIL/uL Final  . Hemoglobin 02/13/2019 11.4* 12.0 - 15.0 g/dL Final  . HCT 02/13/2019 35.5* 36.0 - 46.0 % Final  . MCV 02/13/2019 100.9* 80.0 - 100.0 fL Final  . MCH 02/13/2019 32.4  26.0 - 34.0 pg Final  . MCHC 02/13/2019 32.1  30.0 - 36.0 g/dL Final  . RDW 02/13/2019 15.5  11.5 - 15.5 % Final  . Platelets 02/13/2019 159  150 - 400 K/uL Final  . nRBC 02/13/2019 0.0  0.0 - 0.2 % Final  . Neutrophils Relative % 02/13/2019 60  % Final  . Neutro Abs 02/13/2019 3.5  1.7 - 7.7 K/uL Final  . Lymphocytes Relative 02/13/2019 26  % Final  . Lymphs Abs 02/13/2019 1.5  0.7 - 4.0 K/uL Final  . Monocytes Relative 02/13/2019 11  % Final  . Monocytes Absolute 02/13/2019 0.6  0.1 - 1.0 K/uL Final  .  Eosinophils Relative 02/13/2019 1  % Final  . Eosinophils Absolute 02/13/2019 0.1  0.0 - 0.5 K/uL Final  . Basophils Relative 02/13/2019 2  % Final  . Basophils Absolute 02/13/2019 0.1  0.0 - 0.1 K/uL Final  . Immature Granulocytes 02/13/2019 0  % Final  . Abs Immature Granulocytes 02/13/2019 0.02  0.00 - 0.07 K/uL Final   Performed at [facility], [address] [place], [place] [address]  . Total Protein, Urine 02/13/2019 NEGATIVE  mg/dL Final   Performed at [facility], [address] [place], [place] [address]    Assessment:  Latoya Jones is a 73 y.o. female with stage IV colon cancer with peritoneal metastasis and possible liver metastasis.  She is s/p exploratory laparotomy with right hemicolectomy and terminal ileum resection and separate small bowel resection on 12/06/2017. 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 Radial margin was positive for invasive carcinoma.  There were 2 colon adenomas (2.5 and 1.8 cm) of the ascending colon. Metastatic adenocarcinoma was seen in 4 of 13 lymph nodes. There were 2 tumor deposits.  There was a segment of small intestine with abscess which also showed adenocarcinoma invading the distal appendix with perforation. One mesenteric lymph node was negative for malignancy. Retroperitoneal tumor removal was positive for mucinous adenocarcinoma. Proximal and distal margins were negative.  Pathologic stage was [id number]. MSI stable. KRAS mutation was positive  She received 5 cycles of FOLFOX (01/10/2018 - 03/27/2018).  She began Neulasta with cycle #2.  Oxaliplatin was decreased from 85 mg/m2 to 65 mg/m2 with cycle #4.  She received 6 cycles of 5FU + LV with Avastin (05/01/2018 - 07/17/2018).  She received 10 cycles of FOLIFIRI + Avastin (07/31/2018 - 02/13/2019).  She receives chemotherapy every 3 weeks secondary to tolerance.  Irinotecan was decreased from 150 mg/m2 to 125 mg/m2 with cycle #2.  5FU bolus was discontinued with cycle #6.  Chest, abdomen, and pelvic CT on 03/02/2019 revealed progressive disease within the abdomen and pelvis. There was increase in soft tissue thickening along the surgical site at the ileocolic junction (2.9 x 1.9 cm to 4.6 x 2.5 cm).  There was new and progressive peritoneal metastasis (2.1 x 2.4 cm).  There was no bowel obstruction or other acute complication.  There was enlarging soft tissue metastasis on the right pelvic oblique musculature  (1.1 cm to 1.7 cm).  CEA has been followed: 4.8 on 12/04/2017, 3.1 on 01/10/2018, 4.2 on 03/06/2018, 3.8 on 05/01/2018, 9.2 on 07/31/2018, 5.9 on 09/18/2018, and 5.0 on 10/30/2018.  She has macrocytic RBC indices.  She has a history of a low B12 level.  B12 was 266 on 01/30/2018 and 2448 on 01/22/2019.  Folate was 32 on 01/30/2018 and 45 on 01/22/2019.  TSH was 0.913 on 12/03/2017 and 1.627 on 01/22/2019.  She has paranoid schizophrenia with delusions.  She lives in a group home.  Symptomatically, she feels "pretty good".  She notes some recent constipation.  Plan: 1. Stage IV colon cancer Review interim restaging CT scans.  Images personally reviewed.  Agree with radiology interpretation.  Patient has progressive disease. Review treatment to date  FOLFOX then 5FU + LV + Avastin  FOLFIRI + Avastin Discuss initial testing:  KRAS +.  MSI stable. Discuss consideration of Lonsurf versus Stivarga Discuss Lonsurf    Dose: 35 mg/m2 po on day 1-5 then day 8-12 every 28 days.    Phase III trials (RECOURSE  and TERRA):  Lonsurf vs placebo     OS 7.1 months vs 5.3 months.  Disease control rate (DCR) 44% vs 16%.    Potential side effects reviewed (GI- nausea, vomiting, diarrhea, decreased appetite and hematologic toxicity).    Information sent to patient   Discuss Stivarga (regorafenib)    Dose 160 mg po on day 1-21 every 28 days.    Discuss alternative dosing:  80 mg po q day with weekly escalation if tolerated (goal 160 mg po q day x 21 days/off 7 days).  CORRECT trial (regorafenib vs placebo):  OS 6.4 month  vs 5 months.  Disease control rate (DCR) 41% vs 15%.  CONCUR trial (regorafenib vs placebo):  OS 8.8 months vs 6.3 months.  PFS 3.2 months vs 1.7 months.  DCR 51% vs 7%  Potential side effects reviewed (hand foot syndrome, skin rash, hypertension, diarrhea, liver toxicity; rare bleeding or perforation)  Information sent to patient. Discuss supportive care or Hospice.  Patient wishes to  pursue therapy.  She is considering the above medications (Lonsurf and Stivarga) 2. Constipation Discuss management of constipation. Discuss stool softeners, laxatives as well as milk of magnesia. Discuss fruits or fruit juices that begin with P- peaches, plums, prunes, etc. 3. Stage I sacral decubitus Per report, stable to improved. Continue topical care. Follow-up at next visit in person. 4. RTC for MD assessment (after medication approved), labs (CBC with diff, CMP, CEA), and initiation of therapy.   I discussed the assessment and treatment plan with the patient.  The patient was provided an opportunity to ask questions and all were answered.  The patient agreed with the plan and demonstrated an understanding of the instructions.  The patient was advised to call back or seek an in person evaluation if the symptoms worsen or if the condition fails to improve as anticipated.  I provided 25 minutes of face-to-face video visit time during this this encounter and > 50% was spent counseling as documented under my assessment and plan.  I provided these services from the [facility] office.   Lequita Asal, MD, PhD  03/06/2019, 9:55 AM

## 2019-03-06 ENCOUNTER — Encounter: Payer: Self-pay | Admitting: Hematology and Oncology

## 2019-03-06 ENCOUNTER — Inpatient Hospital Stay: Payer: Medicare Other | Attending: Hematology and Oncology | Admitting: Hematology and Oncology

## 2019-03-06 ENCOUNTER — Other Ambulatory Visit: Payer: Medicare Other

## 2019-03-06 ENCOUNTER — Telehealth: Payer: Self-pay | Admitting: Pharmacist

## 2019-03-06 ENCOUNTER — Ambulatory Visit: Payer: Medicare Other

## 2019-03-06 DIAGNOSIS — C189 Malignant neoplasm of colon, unspecified: Secondary | ICD-10-CM

## 2019-03-06 DIAGNOSIS — C182 Malignant neoplasm of ascending colon: Secondary | ICD-10-CM

## 2019-03-06 DIAGNOSIS — Z79899 Other long term (current) drug therapy: Secondary | ICD-10-CM | POA: Insufficient documentation

## 2019-03-06 DIAGNOSIS — C787 Secondary malignant neoplasm of liver and intrahepatic bile duct: Secondary | ICD-10-CM

## 2019-03-06 DIAGNOSIS — C786 Secondary malignant neoplasm of retroperitoneum and peritoneum: Secondary | ICD-10-CM | POA: Insufficient documentation

## 2019-03-06 DIAGNOSIS — Z7189 Other specified counseling: Secondary | ICD-10-CM

## 2019-03-06 DIAGNOSIS — C180 Malignant neoplasm of cecum: Secondary | ICD-10-CM | POA: Insufficient documentation

## 2019-03-06 DIAGNOSIS — C785 Secondary malignant neoplasm of large intestine and rectum: Secondary | ICD-10-CM | POA: Insufficient documentation

## 2019-03-06 DIAGNOSIS — K5900 Constipation, unspecified: Secondary | ICD-10-CM

## 2019-03-06 DIAGNOSIS — Z9221 Personal history of antineoplastic chemotherapy: Secondary | ICD-10-CM

## 2019-03-06 MED ORDER — TRIFLURIDINE-TIPIRACIL 20-8.19 MG PO TABS: 35.0000 mg/m2 | ORAL_TABLET | Freq: Two times a day (BID) | ORAL | 0 refills | Status: DC

## 2019-03-06 NOTE — Telephone Encounter (Signed)
Oral Oncology Pharmacist Encounter  Received new prescription for Lonsurf (trifluridine/tipiracil) for the treatment of stage IV colon cancer, planned duration until disease progression or unacceptable drug toxicity.  CBC/CMP from 02/13/2019 assessed, no relevant lab abnormalities. Prescription dose and frequency assessed.   Current medication list in Epic reviewed, no DDIs with Lonsurf identified.  Prescription has been e-scribed to the [facility] for benefits analysis and approval.  Oral Oncology Clinic will continue to follow for insurance authorization, copayment issues, initial counseling and start date.  Darl Pikes, PharmD, BCPS, [facility] Hematology/Oncology Clinical Pharmacist [facility]/AP Oral [place] [facility] [phone]  03/06/2019 3:22 PM

## 2019-03-06 NOTE — Progress Notes (Signed)
Patient c/ o abdomen pain ( 5) Ms Latoya Jones states it is due to her constipation, No Bowl movement 4/10 til 4/14 she has been given her purm juice. The patient DOB, Name and address has been verified by phone.

## 2019-03-07 ENCOUNTER — Telehealth: Payer: Self-pay | Admitting: Pharmacist

## 2019-03-07 MED ORDER — TRIFLURIDINE-TIPIRACIL 20-8.19 MG PO TABS: 35.0000 mg/m2 | ORAL_TABLET | Freq: Two times a day (BID) | ORAL | 0 refills | Status: DC

## 2019-03-07 NOTE — Telephone Encounter (Signed)
Oral Chemotherapy Pharmacist Encounter  Patient Education I spoke with patient's caregiver Ms. [name] for overview of new oral chemotherapy medication: Lonsurf (trifluridine/tipiracil) for the treatment of stage IV colon cancer, planned duration until disease progression or unacceptable drug toxicity.  Counseled [name] on administration, dosing, side effects, monitoring, drug-food interactions, safe handling, storage, and disposal. Patient will take 3 tablets (60 mg of trifluridine total) by mouth 2 (two) times daily after a meal. Take on days 1-5, 8-12. Repeat every 28 days.  Side effects include but not limited to: diarrhea, fatigue, decreased wbc/plt/hgb, N/V.    Reviewed with patient importance of keeping a medication schedule and plan for any missed doses.  [name] voiced understanding and appreciation. All questions answered. Medication handout and patient calendar placed in the mail.  Provided patient with Oral [place] [facility] phone number. Patient knows to call the office with questions or concerns. Oral Chemotherapy Navigation Clinic will continue to follow.  Darl Pikes, PharmD, BCPS, [facility] Hematology/Oncology Clinical Pharmacist [facility] Oral [place] [facility] [phone]  03/07/2019 4:51 PM

## 2019-03-07 NOTE — Telephone Encounter (Signed)
Oral Oncology Pharmacist Encounter   Received notification from [employer] that prior authorization for Lonsurf is required.   PA submitted on CMM Key [id number]  Status is pending   Oral Oncology Clinic will continue to follow.   Darl Pikes, PharmD, BCPS, [facility] Hematology/Oncology Clinical Pharmacist [facility] Oral [place] [facility] [phone]  03/07/2019 12:21 PM

## 2019-03-07 NOTE — Telephone Encounter (Addendum)
Oral Oncology Pharmacist Encounter   Prior Authorization for Latoya Jones has been approved.     PA# [id number] Effective dates: 03/07/2019 through 11/22/2019  Copay: $3.64   Oral Oncology Clinic will continue to follow.   Darl Pikes, PharmD, BCPS. [facility] Hematology/Oncology Clinical Pharmacist [facility]/AP Oral Chemotherapy Navigation Clinic [phone]  03/07/2019 12:26 PM

## 2019-03-08 MED FILL — LONSURF 20 MG-8.19 MG TAB: 20-8.19 | 28 days supply | Qty: 60 | Fill #0

## 2019-03-12 ENCOUNTER — Other Ambulatory Visit: Payer: Self-pay

## 2019-03-13 ENCOUNTER — Inpatient Hospital Stay: Payer: Medicare Other

## 2019-03-13 ENCOUNTER — Encounter: Payer: Self-pay | Admitting: Hematology and Oncology

## 2019-03-13 ENCOUNTER — Inpatient Hospital Stay (HOSPITAL_BASED_OUTPATIENT_CLINIC_OR_DEPARTMENT_OTHER): Payer: Medicare Other | Admitting: Hematology and Oncology

## 2019-03-13 DIAGNOSIS — K5900 Constipation, unspecified: Secondary | ICD-10-CM

## 2019-03-13 DIAGNOSIS — C189 Malignant neoplasm of colon, unspecified: Secondary | ICD-10-CM

## 2019-03-13 DIAGNOSIS — C786 Secondary malignant neoplasm of retroperitoneum and peritoneum: Secondary | ICD-10-CM | POA: Diagnosis not present

## 2019-03-13 DIAGNOSIS — C787 Secondary malignant neoplasm of liver and intrahepatic bile duct: Secondary | ICD-10-CM | POA: Diagnosis not present

## 2019-03-13 DIAGNOSIS — G893 Neoplasm related pain (acute) (chronic): Secondary | ICD-10-CM

## 2019-03-13 DIAGNOSIS — C180 Malignant neoplasm of cecum: Secondary | ICD-10-CM | POA: Diagnosis not present

## 2019-03-13 DIAGNOSIS — Z933 Colostomy status: Secondary | ICD-10-CM

## 2019-03-13 DIAGNOSIS — Z7189 Other specified counseling: Secondary | ICD-10-CM | POA: Diagnosis not present

## 2019-03-13 DIAGNOSIS — Z9221 Personal history of antineoplastic chemotherapy: Secondary | ICD-10-CM

## 2019-03-13 DIAGNOSIS — C785 Secondary malignant neoplasm of large intestine and rectum: Secondary | ICD-10-CM | POA: Diagnosis not present

## 2019-03-13 DIAGNOSIS — K6389 Other specified diseases of intestine: Secondary | ICD-10-CM

## 2019-03-13 DIAGNOSIS — Z79899 Other long term (current) drug therapy: Secondary | ICD-10-CM | POA: Diagnosis not present

## 2019-03-13 LAB — CBC WITH DIFFERENTIAL/PLATELET
Abs Immature Granulocytes: 0.04 10*3/uL (ref 0.00–0.07)
Basophils Absolute: 0.1 10*3/uL (ref 0.0–0.1)
Basophils Relative: 1 %
Eosinophils Absolute: 0.1 10*3/uL (ref 0.0–0.5)
Eosinophils Relative: 1 %
HCT: 34.7 % — ABNORMAL LOW (ref 36.0–46.0)
Hemoglobin: 11.4 g/dL — ABNORMAL LOW (ref 12.0–15.0)
Immature Granulocytes: 0 %
Lymphocytes Relative: 14 %
Lymphs Abs: 1.5 10*3/uL (ref 0.7–4.0)
MCH: 31.5 pg (ref 26.0–34.0)
MCHC: 32.9 g/dL (ref 30.0–36.0)
MCV: 95.9 fL (ref 80.0–100.0)
Monocytes Absolute: 1.2 10*3/uL — ABNORMAL HIGH (ref 0.1–1.0)
Monocytes Relative: 11 %
Neutro Abs: 7.7 10*3/uL (ref 1.7–7.7)
Neutrophils Relative %: 73 %
Platelets: 246 10*3/uL (ref 150–400)
RBC: 3.62 MIL/uL — ABNORMAL LOW (ref 3.87–5.11)
RDW: 14.6 % (ref 11.5–15.5)
WBC: 10.5 10*3/uL (ref 4.0–10.5)
nRBC: 0 % (ref 0.0–0.2)

## 2019-03-13 LAB — COMPREHENSIVE METABOLIC PANEL
ALT: 18 U/L (ref 0–44)
AST: 20 U/L (ref 15–41)
Albumin: 3.6 g/dL (ref 3.5–5.0)
Alkaline Phosphatase: 96 U/L (ref 38–126)
Anion gap: 7 (ref 5–15)
BUN: 9 mg/dL (ref 8–23)
CO2: 26 mmol/L (ref 22–32)
Calcium: 9.6 mg/dL (ref 8.9–10.3)
Chloride: 100 mmol/L (ref 98–111)
Creatinine, Ser: 0.63 mg/dL (ref 0.44–1.00)
GFR calc Af Amer: >60 mL/min (ref >60)
GFR calc non Af Amer: >60 mL/min (ref >60)
Glucose, Bld: 106 mg/dL — ABNORMAL HIGH (ref 70–99)
Potassium: 4.5 mmol/L (ref 3.5–5.1)
Sodium: 133 mmol/L — ABNORMAL LOW (ref 135–145)
Total Bilirubin: 0.8 mg/dL (ref 0.3–1.2)
Total Protein: 7.2 g/dL (ref 6.5–8.1)

## 2019-03-13 LAB — MAGNESIUM: Magnesium: 2.2 mg/dL (ref 1.7–2.4)

## 2019-03-13 NOTE — Progress Notes (Signed)
Confirmed name, DOB, and address with patient and [name]. Reports RUQ abdominal pain that radiated to back x "couple weeks". Denies any constipation. No other concerns at this time.

## 2019-03-13 NOTE — Progress Notes (Signed)
[facility]  [address] [place], [place] [address] Phone: [phone]  Fax: [phone]   Telemedicine Office Visit:  03/13/2019  Referring physician: Gae Bon, NP  I connected with Latoya Jones on 03/13/19 at 12:04 PM EDT by videoconferencing and verified that I was speaking with the correct person using 2 identifiers.  The patient was in her bedroom at the care home with Ms [name].  I discussed the limitations, risk, security and privacy concerns of performing an evaluation and management service by videoconferencing and the availability of in person appointments.  I also discussed with the patient that there may be a patient responsible charge related to this service.  The patient expressed understanding and agreed to proceed.    Chief Complaint: Latoya Jones is a 73 y.o. female with stage IV colon cancer who is seen for assessment prior to initiation of Lonsurf.  HPI:  The patient was last seen in the medical oncology clinic on 03/06/2019.  At that time, she felt "pretty good".  She noted some recent constipation.  CT scans revealed progression.  We discussed her thoughts about therapy.  She wished to try other treatment options.  We reviewed treatment was palliative.  We discussed Lonsurf and Stiv[place]a.    During the interim, she has felt the same.  She describes a little "belly pain".  She notes constipation.  She had a bowel movement yesterday. She has been drinking prune juice.  Sometimes she has abdominal discomfort in the middle of the night.  Tylenol relieves her pain.   Past Medical History:  Diagnosis Date  . Colon cancer ([place])   . Hypertension     Past Surgical History:  Procedure Laterality Date  . COLOSTOMY REVISION N/A 12/06/2017   Procedure: COLON RESECTION RIGHT;  Surgeon: Florene Glen, MD;  Location: [facility] ORS;  Service: General;  Laterality: N/A;  . DILATION AND CURETTAGE, DIAGNOSTIC / THERAPEUTIC    .  PORTACATH PLACEMENT N/A 01/06/2018   Procedure: INSERTION PORT-A-CATH;  Surgeon: Vickie Epley, MD;  Location: [facility] ORS;  Service: General;  Laterality: N/A;    Family History  Problem Relation Age of Onset  . ALS Mother   . ALS Father     Social History:  reports that she quit smoking about 11 years ago. She has never used smokeless tobacco. She reports that she does not drink alcohol or use drugs.  She lives in a family care home; generally accompanied by [name]. She has her own room at the care home.   Participants in the patient's visit and their role in the encounter included the patient, [name] (family care manager), and Waymon Budge, RN, today.  The intake visit was provided by Waymon Budge, RN.     Allergies: No Known Allergies  Current Medications: Current Outpatient Medications  Medication Sig Dispense Refill  . acetaminophen (TYLENOL) 500 MG tablet Take 1,000 mg by mouth every 8 (eight) hours as needed for mild pain.    Marland Kitchen alendronate (FOSAMAX) 70 MG tablet Take 70 mg by mouth once a week.    Marland Kitchen amLODipine (NORVASC) 10 MG tablet Take 10 mg by mouth daily.     . ARIPiprazole (ABILIFY) 15 MG tablet Take 7.5 mg by mouth daily.    Marland Kitchen atorvastatin (LIPITOR) 20 MG tablet Take 1 tablet by mouth daily.    . calcium carbonate (TUMS - DOSED IN MG ELEMENTAL CALCIUM) 500 MG chewable tablet Chew 1 tablet by mouth  2 (two) times daily as needed for indigestion or heartburn.    . Calcium Carbonate-Vitamin D3 (CALCIUM 600-D) 600-400 MG-UNIT TABS Take 1 tablet by mouth daily.    . memantine (NAMENDA XR) 14 MG CP24 24 hr capsule Take 14 mg by mouth at bedtime.     . metoprolol succinate (TOPROL-XL) 50 MG 24 hr tablet Take 25 mg by mouth every morning. Take with or immediately following a meal.     . Multiple Vitamins-Minerals (SENTRY SENIOR PO) Take 1 tablet by mouth daily.    . ondansetron (ZOFRAN) 8 MG tablet Take 1 tablet (8 mg total) by mouth 2 (two) times daily as  needed for nausea or vomiting. Can start using on day 3 after chemo 20 tablet 0  . perphenazine (TRILAFON) 4 MG tablet Take 4 mg by mouth 2 (two) times daily.    . prochlorperazine (COMPAZINE) 10 MG tablet Take 1 tablet (10 mg total) by mouth every 6 (six) hours as needed for nausea or vomiting. 30 tablet 0  . lidocaine-prilocaine (EMLA) cream Apply 1 application topically as needed. 1 hour prior to  each chemotherapy treatment. Place small amount over port site and place saran wrap over the cream to protect clothing (Patient not taking: Reported on 03/13/2019) 30 g 0  . loratadine (CLARITIN) 10 MG tablet Take 1 tablet (10 mg total) by mouth daily as needed for allergies. Take 1 tablet the day before pump removal, 1 tablet the day of pump removal and 1 tablet the day after pump removal. Take as directed with each cycle of  chemotherapy treatment (Patient not taking: Reported on 03/13/2019) 30 tablet 1  . trifluridine-tipiracil (LONSURF) 20-8.19 MG tablet Take 3 tablets (60 mg of trifluridine total) by mouth 2 (two) times daily after a meal. Take on days 1-5, 8-12. Repeat every 28days. (Patient not taking: Reported on 03/13/2019) 60 tablet 0   No current facility-administered medications for this visit.    Facility-Administered Medications Ordered in Other Visits  Medication Dose Route Frequency Provider Last Rate Last Dose  . heparin lock flush 100 unit/mL  500 Units Intravenous Once Sindy Guadeloupe, MD      . heparin lock flush 100 unit/mL  500 Units Intravenous Once Sindy Guadeloupe, MD      . sodium chloride flush (NS) 0.9 % injection 10 mL  10 mL Intravenous PRN Sindy Guadeloupe, MD   10 mL at 02/06/18 0845  . sodium chloride flush (NS) 0.9 % injection 10 mL  10 mL Intravenous PRN Sindy Guadeloupe, MD   10 mL at 03/27/18 0909    Review of Systems  Constitutional: Negative for chills, diaphoresis, fever, malaise/fatigue and weight loss.       Feels "good".  HENT: Negative.  Negative for congestion, ear  discharge, ear pain, nosebleeds, sinus pain and sore throat.   Eyes: Negative.  Negative for blurred vision, double vision, photophobia and pain.  Respiratory: Negative.  Negative for cough, hemoptysis, sputum production and shortness of breath.   Cardiovascular: Negative.  Negative for chest pain, palpitations, orthopnea, leg swelling (no change) and PND.       H/o asymptomatic bradycardia.  Gastrointestinal: Negative for abdominal pain, blood in stool, constipation (managed with prune juice), diarrhea, heartburn, melena, nausea and vomiting.       Eating well.  Intermittent abdominal pain managed with Tylenol.  Genitourinary: Negative.  Negative for dysuria, frequency, hematuria and urgency.  Musculoskeletal: Negative.  Negative for back pain, falls, joint pain, myalgias  and neck pain.  Skin: Negative for itching and rash.       Skin breakdown to sacrum, stable.  Neurological: Negative.  Negative for dizziness, tingling, tremors, sensory change, speech change, focal weakness, weakness and headaches.  Endo/Heme/Allergies: Negative.  Does not bruise/bleed easily.  Psychiatric/Behavioral: Positive for memory loss. Negative for depression. The patient is not nervous/anxious and does not have insomnia.        Schizophrenia.  All other systems reviewed and are negative.  Performance status (ECOG): 2  Physical Exam  Constitutional: She is oriented to person, place, and time and well-developed, well-nourished, and in no distress.  HENT:  Head: Normocephalic and atraumatic.  Mouth/Throat: Mucous membranes are normal.  Wearing a cap.  Short graying hair.  Eyes: Conjunctivae are normal. No scleral icterus.  Glasses.  Blue eyes.  Neurological: She is alert and oriented to person, place, and time.  Skin: She is not diaphoretic.  Psychiatric: Mood and judgment normal. Her mood appears not anxious. She does not exhibit a depressed mood.    Imaging studies: 12/03/2017:  Abdomen and pelvic CT  revealed a large cecal mass causing secondary appendicitis. 12/03/2017:  CT chest without contrast revealed tiny 2 mm right upper lobe pulmonary nodule.  04/20/2018:  Chest, abdomen, and pelvic CT revealed peritoneal nodularity within the right pericolic gutter which may represent postsurgical change and/or residual disease. There was a soft tissue mass which appeared to be involving the distal small bowel within the right lower quadrant which may represent residual/recurrent disease. There was soft tissue irregularity and fluid within the right adnexa which may represent postsurgical changes or residual/recurrent disease. 07/27/2018:  Chest, abdomen and pelvic CT revealed interval increase in size of right hepatic lobe lesion concerning hepatic metastatic disease.  There was similar-appearing nodularity within the right pericolic gutter which may represent metastatic disease or postsurgical changes.  There was similar-appearing soft tissue mass within the distal small bowel concerning for residual disease.  There was stable bilateral adrenal nodules.  There was interval development of patchy ground-glass opacities predominantly within the upper lobes bilaterally, favored to be infectious/inflammatory in etiology.  10/24/2018:  Chest, abdomen, and pelvic CT revealed no pulmonary metastatic disease.  There was slight interval decrease in size of the right hepatic lobe metastatic lesion.  There were no new lesions or progressive findings.  There was stable soft tissue lesions involving the colon near the anastomosis and also 2 separate right lower quadrant small bowel loops.  There were no new or progressive findings.  There were stable small scattered adjacent mesenteric nodes. 03/02/2019:  Chest, abdomen, and pelvic CT revealed progressive disease within the abdomen and pelvis. There was increase in soft tissue thickening along the surgical site at the ileocolic junction (2.9 x 1.9 cm to 4.6 x 2.5 cm).   There was new and progressive peritoneal metastasis (2.1 x 2.4 cm).  There was no bowel obstruction or other acute complication.  There was enlarging soft tissue metastasis on the right pelvic oblique musculature (1.1 cm to 1.7 cm).   Appointment on 03/13/2019  Component Date Value Ref Range Status  . Sodium 03/13/2019 133* 135 - 145 mmol/L Final  . Potassium 03/13/2019 4.5  3.5 - 5.1 mmol/L Final  . Chloride 03/13/2019 100  98 - 111 mmol/L Final  . CO2 03/13/2019 26  22 - 32 mmol/L Final  . Glucose, Bld 03/13/2019 106* 70 - 99 mg/dL Final  . BUN 03/13/2019 9  8 - 23 mg/dL Final  . Creatinine,  Ser 03/13/2019 0.63  0.44 - 1.00 mg/dL Final  . Calcium 03/13/2019 9.6  8.9 - 10.3 mg/dL Final  . Total Protein 03/13/2019 7.2  6.5 - 8.1 g/dL Final  . Albumin 03/13/2019 3.6  3.5 - 5.0 g/dL Final  . AST 03/13/2019 20  15 - 41 U/L Final  . ALT 03/13/2019 18  0 - 44 U/L Final  . Alkaline Phosphatase 03/13/2019 96  38 - 126 U/L Final  . Total Bilirubin 03/13/2019 0.8  0.3 - 1.2 mg/dL Final  . GFR calc non Af Amer 03/13/2019 >60  >60 mL/min Final  . GFR calc Af Amer 03/13/2019 >60  >60 mL/min Final  . Anion gap 03/13/2019 7  5 - 15 Final   Performed at [facility], [address] [place], [place] [address]  . WBC 03/13/2019 10.5  4.0 - 10.5 K/uL Final  . RBC 03/13/2019 3.62* 3.87 - 5.11 MIL/uL Final  . Hemoglobin 03/13/2019 11.4* 12.0 - 15.0 g/dL Final  . HCT 03/13/2019 34.7* 36.0 - 46.0 % Final  . MCV 03/13/2019 95.9  80.0 - 100.0 fL Final  . MCH 03/13/2019 31.5  26.0 - 34.0 pg Final  . MCHC 03/13/2019 32.9  30.0 - 36.0 g/dL Final  . RDW 03/13/2019 14.6  11.5 - 15.5 % Final  . Platelets 03/13/2019 246  150 - 400 K/uL Final  . nRBC 03/13/2019 0.0  0.0 - 0.2 % Final  . Neutrophils Relative % 03/13/2019 73  % Final  . Neutro Abs 03/13/2019 7.7  1.7 - 7.7 K/uL Final  . Lymphocytes Relative 03/13/2019 14  % Final  . Lymphs Abs 03/13/2019 1.5  0.7 - 4.0 K/uL Final  . Monocytes  Relative 03/13/2019 11  % Final  . Monocytes Absolute 03/13/2019 1.2* 0.1 - 1.0 K/uL Final  . Eosinophils Relative 03/13/2019 1  % Final  . Eosinophils Absolute 03/13/2019 0.1  0.0 - 0.5 K/uL Final  . Basophils Relative 03/13/2019 1  % Final  . Basophils Absolute 03/13/2019 0.1  0.0 - 0.1 K/uL Final  . Immature Granulocytes 03/13/2019 0  % Final  . Abs Immature Granulocytes 03/13/2019 0.04  0.00 - 0.07 K/uL Final   Performed at [facility], [address], [place], [place] [address]  . Magnesium 03/13/2019 2.2  1.7 - 2.4 mg/dL Final   Performed at [facility], [address] [place], [place] [address]    Assessment:  Latoya Jones is a 73 y.o. female with stage IV colon cancer with peritoneal metastasis and possible liver metastasis.  She is s/p exploratory laparotomy with right hemicolectomy and terminal ileum resection and separate small bowel resection on 12/06/2017. 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 Radial margin was positive for invasive carcinoma.  There were 2 colon adenomas (2.5 and 1.8 cm) of the ascending colon. Metastatic adenocarcinoma was seen in 4 of 13 lymph nodes. There were 2 tumor deposits.  There was a segment of small intestine with abscess which also showed adenocarcinoma invading the distal appendix with perforation. One mesenteric lymph node was negative for malignancy. Retroperitoneal tumor removal was positive for mucinous adenocarcinoma. Proximal and distal margins were negative.  Pathologic stage was [id number]. MSI stable. KRAS mutation was positive  She received 5 cycles of FOLFOX (01/10/2018 - 03/27/2018).  She began Neulasta with cycle #2.  Oxaliplatin was decreased from 85 mg/m2 to 65 mg/m2 with cycle #4.  She received 6 cycles of  5FU + LV with Avastin (05/01/2018 - 07/17/2018).  She received 10 cycles of FOLIFIRI + Avastin (07/31/2018 - 02/13/2019).  She  receives chemotherapy every 3 weeks secondary to tolerance.  Irinotecan was decreased from 150 mg/m2 to 125 mg/m2 with cycle #2.  5FU bolus was discontinued with cycle #6.  Chest, abdomen, and pelvic CT on 03/02/2019 revealed progressive disease within the abdomen and pelvis. There was increase in soft tissue thickening along the surgical site at the ileocolic junction (2.9 x 1.9 cm to 4.6 x 2.5 cm).  There was new and progressive peritoneal metastasis (2.1 x 2.4 cm).  There was no bowel obstruction or other acute complication.  There was enlarging soft tissue metastasis on the right pelvic oblique musculature (1.1 cm to 1.7 cm).  CEA has been followed: 4.8 on 12/04/2017, 3.1 on 01/10/2018, 4.2 on 03/06/2018, 3.8 on 05/01/2018, 9.2 on 07/31/2018, 5.9 on 09/18/2018, and 5.0 on 10/30/2018.  She has macrocytic RBC indices.  She has a history of a low B12 level.  B12 was 266 on 01/30/2018 and 2448 on 01/22/2019.  Folate was 32 on 01/30/2018 and 45 on 01/22/2019.  TSH was 0.913 on 12/03/2017 and 1.627 on 01/22/2019.  She has paranoid schizophrenia with delusions.  She lives in a group home.  Symptomatically, she feels "good".  She has intermittent abdominal pain relived with Tylenol.  She has mild constipation.  Plan: 1.   Stage IV colon cancer Patient has received  FOLFOX then 5FU + LV + Avastin  FOLFIRI + Avastin Tumor is  KRAS +.  MSI stable. Discuss plan for Lonsurf.   Dose: 35 mg/m2 po BID on day 1-5 then day 8-12 every 28 days.   Discuss typically treated Monday-Friday x 2 weeks then 2 weeks off.   Discuss plan for cycle #1 Wednesday - Sunday x 2 weeks   Potential side effects re-reviewed (nausea, vomiting, diarrhea, decreased appetite, and low blood counts).  Patient consented to treatment and wishes to begin on 03/14/2019.  Orders FAXed to care home [phone]). 2.   Constipation Discuss bowels and current management with prune juice. Re-review other options including stool  softeners, laxatives as well as milk of magnesia. Re-review fruits or fruit juices that begin with P- peaches, plums, prunes, etc. 3.   Stage I sacral decubitus Continue topical care. Exam at next in person visit. 4.   RTC weekly x 2 on Tuesdays (04/28, 05/05) for labs (CBC with diff, CMP). 5.   RTC (Doximetry) on Wednesday x 2 (04/29, 05/06) 6.   RTC on 04/13/2019 for MD assessment (in person) for labs (CBC with diff, CMP, Mg, CEA), prior to initiation of cycle #2 Lonsurf (Monday 04/16/2019).   I discussed the assessment and treatment plan with the patient.  The patient was provided an opportunity to ask questions and all were answered.  The patient agreed with the plan and demonstrated an understanding of the instructions.  The patient was advised to call back or seek an in person evaluation if the symptoms worsen or if the condition fails to improve as anticipated.  I provided 25 minutes of face-to-face video visit time (12:04 PM - 12:29 PM) during this this encounter and > 50% was spent counseling as documented under my assessment and plan.  I provided these services from the [facility] office.   Lequita Asal, MD, PhD  03/13/2019, 12:04 PM

## 2019-03-14 ENCOUNTER — Ambulatory Visit: Payer: Medicare Other | Admitting: Hematology and Oncology

## 2019-03-14 ENCOUNTER — Other Ambulatory Visit: Payer: Medicare Other

## 2019-03-14 LAB — CEA: CEA: 7.5 ng/mL — ABNORMAL HIGH (ref 0.0–4.7)

## 2019-03-19 ENCOUNTER — Other Ambulatory Visit: Payer: Self-pay

## 2019-03-20 ENCOUNTER — Telehealth: Payer: Self-pay | Admitting: Hematology and Oncology

## 2019-03-20 ENCOUNTER — Inpatient Hospital Stay: Payer: Medicare Other

## 2019-03-20 DIAGNOSIS — C786 Secondary malignant neoplasm of retroperitoneum and peritoneum: Secondary | ICD-10-CM

## 2019-03-20 DIAGNOSIS — C189 Malignant neoplasm of colon, unspecified: Secondary | ICD-10-CM

## 2019-03-20 DIAGNOSIS — C787 Secondary malignant neoplasm of liver and intrahepatic bile duct: Secondary | ICD-10-CM

## 2019-03-20 DIAGNOSIS — C180 Malignant neoplasm of cecum: Secondary | ICD-10-CM | POA: Diagnosis not present

## 2019-03-20 LAB — CBC WITH DIFFERENTIAL/PLATELET
Abs Immature Granulocytes: 0.06 10*3/uL (ref 0.00–0.07)
Basophils Absolute: 0.1 10*3/uL (ref 0.0–0.1)
Basophils Relative: 1 %
Eosinophils Absolute: 0.1 10*3/uL (ref 0.0–0.5)
Eosinophils Relative: 1 %
HCT: 34 % — ABNORMAL LOW (ref 36.0–46.0)
Hemoglobin: 11 g/dL — ABNORMAL LOW (ref 12.0–15.0)
Immature Granulocytes: 1 %
Lymphocytes Relative: 18 %
Lymphs Abs: 1.4 10*3/uL (ref 0.7–4.0)
MCH: 31.1 pg (ref 26.0–34.0)
MCHC: 32.4 g/dL (ref 30.0–36.0)
MCV: 96 fL (ref 80.0–100.0)
Monocytes Absolute: 0.3 10*3/uL (ref 0.1–1.0)
Monocytes Relative: 3 %
Neutro Abs: 6 10*3/uL (ref 1.7–7.7)
Neutrophils Relative %: 76 %
Platelets: 234 10*3/uL (ref 150–400)
RBC: 3.54 MIL/uL — ABNORMAL LOW (ref 3.87–5.11)
RDW: 14.9 % (ref 11.5–15.5)
WBC: 7.9 10*3/uL (ref 4.0–10.5)
nRBC: 0 % (ref 0.0–0.2)

## 2019-03-20 LAB — COMPREHENSIVE METABOLIC PANEL
ALT: 14 U/L (ref 0–44)
AST: 20 U/L (ref 15–41)
Albumin: 3.4 g/dL — ABNORMAL LOW (ref 3.5–5.0)
Alkaline Phosphatase: 86 U/L (ref 38–126)
Anion gap: 9 (ref 5–15)
BUN: 11 mg/dL (ref 8–23)
CO2: 22 mmol/L (ref 22–32)
Calcium: 9.2 mg/dL (ref 8.9–10.3)
Chloride: 101 mmol/L (ref 98–111)
Creatinine, Ser: 0.66 mg/dL (ref 0.44–1.00)
GFR calc Af Amer: >60 mL/min (ref >60)
GFR calc non Af Amer: >60 mL/min (ref >60)
Glucose, Bld: 160 mg/dL — ABNORMAL HIGH (ref 70–99)
Potassium: 4.3 mmol/L (ref 3.5–5.1)
Sodium: 132 mmol/L — ABNORMAL LOW (ref 135–145)
Total Bilirubin: 0.6 mg/dL (ref 0.3–1.2)
Total Protein: 6.9 g/dL (ref 6.5–8.1)

## 2019-03-20 IMAGING — CT CT CHEST W/ CM
1 of 4 series · 10 of 30 positions shown, 13 images · IV contrast (iopamidol)
Comparison: CT CAP 12/03/2017

CLINICAL DATA: Patient with history of colon cancer status post
hemicolectomy.

EXAM:
CT CHEST, ABDOMEN, AND PELVIS WITH CONTRAST
TECHNIQUE: Multidetector CT imaging of the chest, abdomen and pelvis was
performed following the standard protocol during bolus
administration of intravenous contrast.
CONTRAST:  100mL INNKRB-JQQ IOPAMIDOL (INNKRB-JQQ) INJECTION 61%

[Series 2: cap with · axial · 0.74mm/px · z∈[-935,-415]mm · 10 of 128 slices shown, 13 images]
[im 12/128  mediastinal]
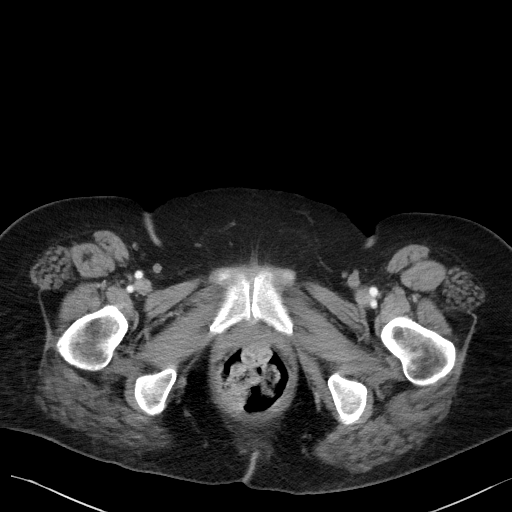
[im 12/128  lung]
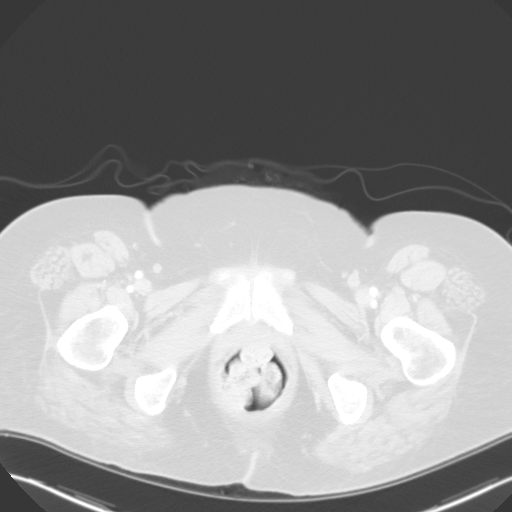
[im 24/128  lung]
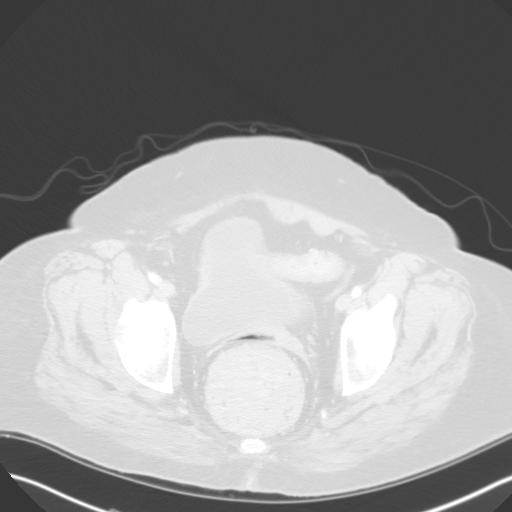
[im 35/128  lung]
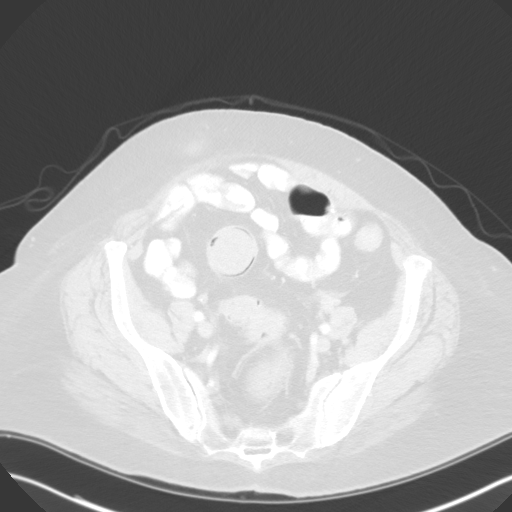
[im 47/128  lung]
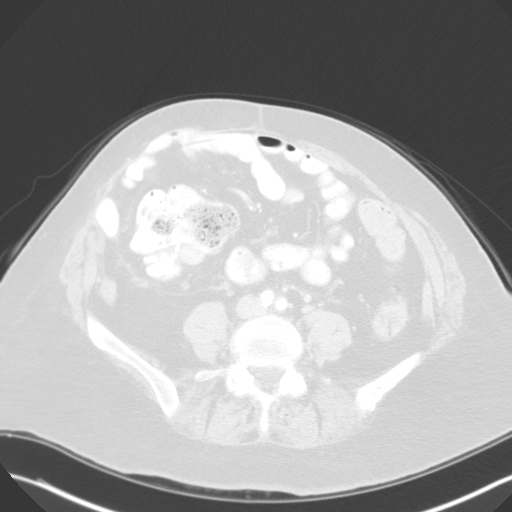
[im 58/128  mediastinal]
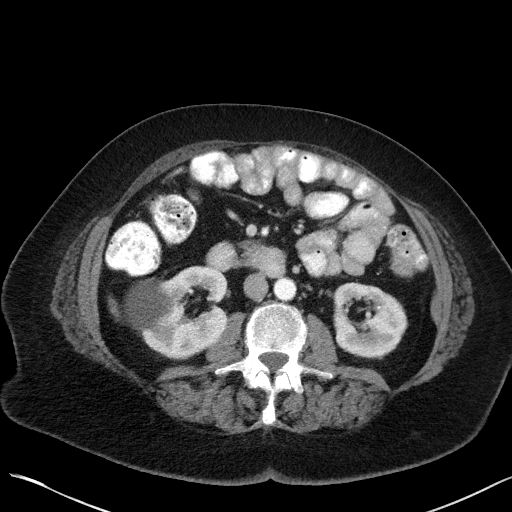
[im 58/128  lung]
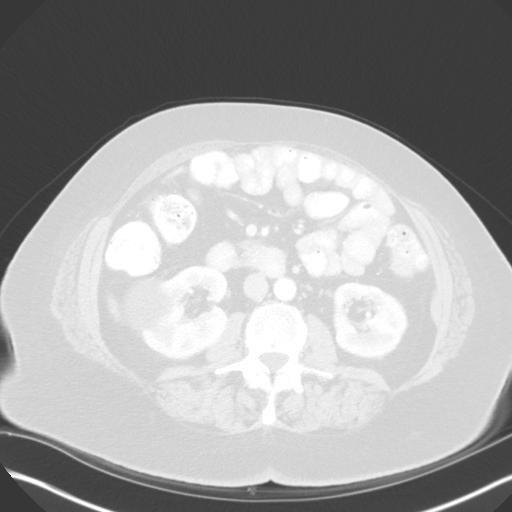
[im 70/128  lung]
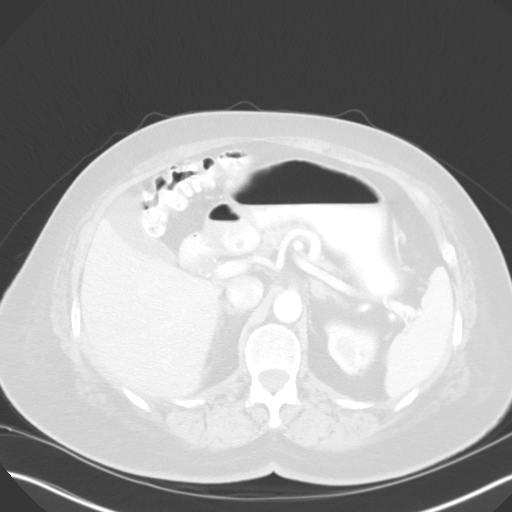
[im 81/128  lung]
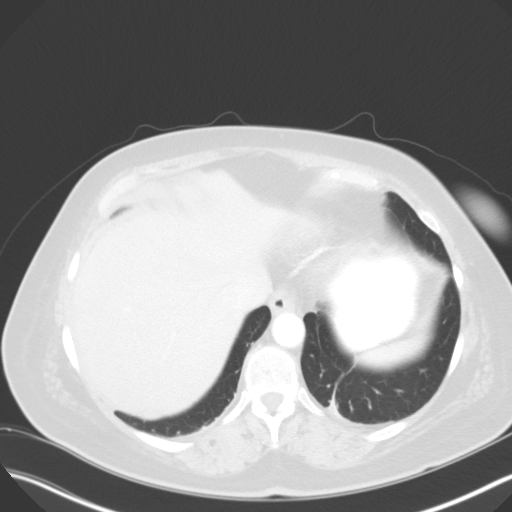
[im 93/128  lung]
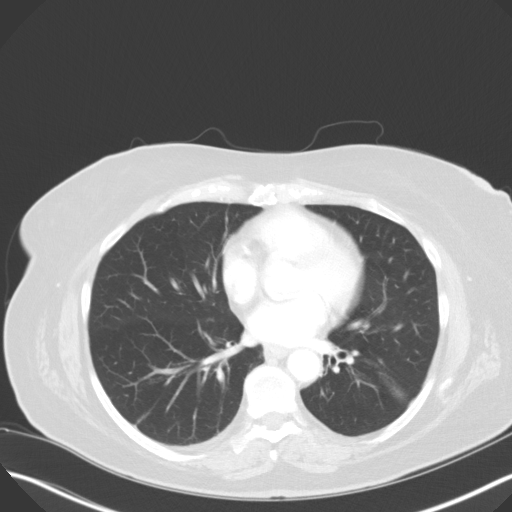
[im 104/128  mediastinal]
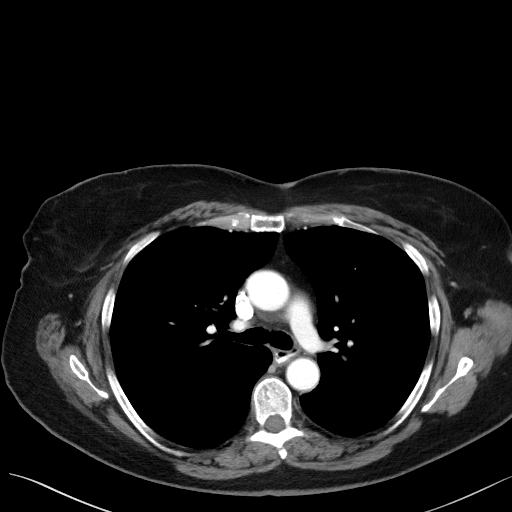
[im 104/128  lung]
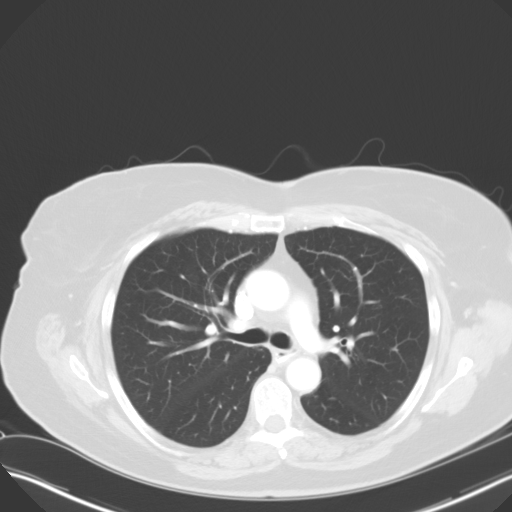
[im 116/128  lung]
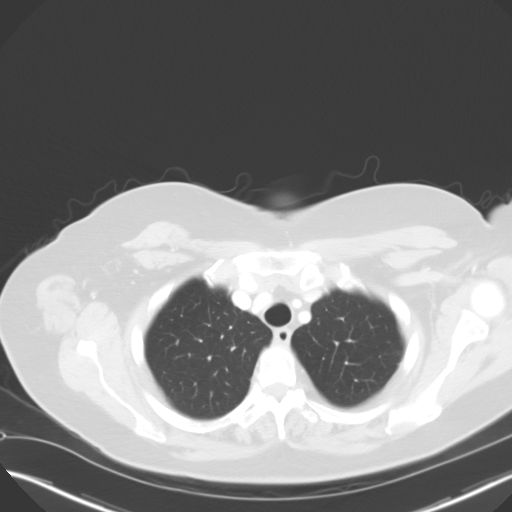

[10 of 30 positions shown; findings below may reference images not displayed]

FINDINGS: CT CHEST FINDINGS

Cardiovascular: Right anterior chest wall Port-A-Cath is present
with tip terminating in the superior vena cava. Trace fluid superior
pericardial recess. Normal heart size.

Mediastinum/Nodes: No enlarged axillary, mediastinal or hilar
lymphadenopathy. Esophagus is normal in appearance.

Lungs/Pleura: Central airways are patent. Bandlike atelectasis
within the left lower lobe and lingula. No large area of pulmonary
consolidation. No pleural effusion or pneumothorax.
Similar-appearing 3 mm right upper lobe nodule (image 36; series 4).

Musculoskeletal: Thoracic spine degenerative changes. No aggressive
or acute appearing osseous lesions.

CT ABDOMEN PELVIS FINDINGS

Hepatobiliary: Liver is normal in size and contour. There is patchy
low-attenuation peripherally, predominately within the right hepatic
lobe (image 55; series 2). Additionally within the right hepatic
lobe there is an 8 mm flash filling lesion (image 52; series 2).
Gallbladder is unremarkable. No intrahepatic or extrahepatic biliary
ductal dilatation.

Pancreas: Unremarkable

Spleen: Unremarkable

Adrenals/Urinary Tract: Stable 1 cm left adrenal nodule and mild
right adrenal thickening. Stable 9 mm right adrenal nodule (image 7;
series 2). There is a 2 cm cyst within the interpolar region of the
left kidney. There is a 5.1 cm cyst exophytic off the inferior pole
of the right kidney. No hydronephrosis. Urinary bladder is
unremarkable. 2 mm stone interpolar region left kidney (image 82;
series 6).

Stomach/Bowel: Large amount of stool within the rectum and distal
colon. Patient status post distal ileal resection and right
hemicolectomy. There is a 2.9 x 1.7 cm soft tissue mass which
appears to be involving the distal small bowel within the right
lower quadrant (image 98; series 2). Mild nodularity of the
peritoneum within the right pericolic gutter (image 73; series 6).
Reference nodule measures 1.0 cm (image 82; series 2). Additional
small nodules/lymph nodes are demonstrated within mesentery in the
right hemiabdomen (image 88; series 2). Normal morphology of the
stomach. No free fluid or free intraperitoneal air.

Vascular/Lymphatic: Normal caliber abdominal aorta. Peripheral
calcified atherosclerotic plaque. No retroperitoneal
lymphadenopathy.

Reproductive: Uterus and left adnexal structures unremarkable. There
is small amount of fluid and irregularity involving the right
adnexal structures (image 97; series 2).

Other: Small bowel containing right anterior abdominal wall hernia.

Musculoskeletal: Lumbar spine degenerative changes. No aggressive or
acute appearing osseous lesions.
IMPRESSION: 1. Patient status post interval distal ileal resection and ascending
colectomy for removal of a large mass. There is peritoneal
nodularity within the right pericolic gutter which may represent
postsurgical change and/or residual disease.
2. There is a soft tissue mass which appears to be involving the
distal small bowel within the right lower quadrant which may
represent residual/recurrent disease.
3. There is soft tissue irregularity and fluid within the right
adnexa which may represent postsurgical changes or
residual/recurrent disease.
4. Large amount of stool within the rectum as can be seen with
constipation.
5. Patchy peripheral low attenuation throughout the liver which may
represent interval steatosis. Recommend attention on follow-up.
Additionally, this can be further evaluated with MRI as clinically
indicated.
6. Indeterminate flash filling lesion within the right hepatic lobe,
potentially flash filling hemangioma. Recommend attention on
follow-up.
7. Stable bilateral adrenal nodules. Recommend attention on
follow-up.

## 2019-03-20 NOTE — Progress Notes (Signed)
[facility]  [address], [address] [place], [place] [address] Phone: [phone]  Fax: [phone]   Telemedicine Office Visit:  03/21/2019  Referring physician: Gae Bon, NP  I connected with Latoya Jones on 03/21/2019 at 8:59 AM by videoconferencing and verified that I was speaking with the correct person using 2 identifiers.  The patient was at the family care home, [name] office.  I discussed the limitations, risk, security and privacy concerns of performing an evaluation and management service by videoconferencing and the availability of in person appointments.  I also discussed with the patient that there may be a patient responsible charge related to this service.  The patient expressed understanding and agreed to proceed.  Chief Complaint: Latoya Jones is a 73 y.o. female  with stage IV colon cancer who is seen for assessment on day 8 of cycle #1 Lonsurf.  HPI: The patient was last seen in the oncology clinic on 03/13/2019.  At that time, she felt "good".  She reported some intermittent mild abdominal discomfort and constipation relieved with Tylenol and prune juice.   She began cycle #1 of Lonsurf on 03/14/2019.  During the interim, she has been doing well. She reports constipation, BMs every other day, that improves with prune juice.  She has abdominal pain that improves with Tylenol, at most once daily.   Appetite is good. Today she weighed 163.6, down 4lbs from her last clinic visit on 02/13/2019.  Skin breakdown on her sacrum has improved with application of creams daily. It is intact and pink.   She began cycle #1 day 8 of Lonsurf this morning.    Past Medical History:  Diagnosis Date   Colon cancer ([place])    Hypertension     Past Surgical History:  Procedure Laterality Date   COLOSTOMY REVISION N/A 12/06/2017   Procedure: COLON RESECTION RIGHT;  Surgeon: Florene Glen, MD;  Location: [facility] ORS;  Service: General;   Laterality: N/A;   DILATION AND CURETTAGE, DIAGNOSTIC / THERAPEUTIC     PORTACATH PLACEMENT N/A 01/06/2018   Procedure: INSERTION PORT-A-CATH;  Surgeon: Vickie Epley, MD;  Location: [facility] ORS;  Service: General;  Laterality: N/A;   Family History  Problem Relation Age of Onset   ALS Mother    ALS Father     Social History:  reports that she quit smoking about 11 years ago. She has never used smokeless tobacco. She reports that she does not drink alcohol or use drugs. She lives in a family care home; generally accompanied by [name]. She has her own room at the care home.  The patient is accompanied by [name] today.  Participants in the patient's visit and their role in the encounter included the patient, [name], and Vito Berger, CMA, today.  The intake visit was provided by Vito Berger, CMA.  Allergies: No Known Allergies  Current Medications: Current Outpatient Medications  Medication Sig Dispense Refill   acetaminophen (TYLENOL) 500 MG tablet Take 1,000 mg by mouth every 8 (eight) hours as needed for mild pain.     alendronate (FOSAMAX) 70 MG tablet Take 70 mg by mouth once a week.     amLODipine (NORVASC) 10 MG tablet Take 10 mg by mouth daily.      ARIPiprazole (ABILIFY) 15 MG tablet Take 7.5 mg by mouth daily.     atorvastatin (LIPITOR) 20 MG tablet Take 1 tablet by mouth daily.     calcium carbonate (TUMS - DOSED IN MG  ELEMENTAL CALCIUM) 500 MG chewable tablet Chew 1 tablet by mouth 2 (two) times daily as needed for indigestion or heartburn.     Calcium Carbonate-Vitamin D3 (CALCIUM 600-D) 600-400 MG-UNIT TABS Take 1 tablet by mouth daily.     memantine (NAMENDA XR) 14 MG CP24 24 hr capsule Take 14 mg by mouth at bedtime.      metoprolol succinate (TOPROL-XL) 50 MG 24 hr tablet Take 25 mg by mouth every morning. Take with or immediately following a meal.      Multiple Vitamins-Minerals (SENTRY SENIOR PO) Take 1 tablet by mouth daily.      perphenazine (TRILAFON) 4 MG tablet Take 4 mg by mouth 2 (two) times daily.     trifluridine-tipiracil (LONSURF) 20-8.19 MG tablet Take 3 tablets (60 mg of trifluridine total) by mouth 2 (two) times daily after a meal. Take on days 1-5, 8-12. Repeat every 28days. 60 tablet 0   lidocaine-prilocaine (EMLA) cream Apply 1 application topically as needed. 1 hour prior to  each chemotherapy treatment. Place small amount over port site and place saran wrap over the cream to protect clothing (Patient not taking: Reported on 03/13/2019) 30 g 0   loratadine (CLARITIN) 10 MG tablet Take 1 tablet (10 mg total) by mouth daily as needed for allergies. Take 1 tablet the day before pump removal, 1 tablet the day of pump removal and 1 tablet the day after pump removal. Take as directed with each cycle of  chemotherapy treatment (Patient not taking: Reported on 03/13/2019) 30 tablet 1   ondansetron (ZOFRAN) 8 MG tablet Take 1 tablet (8 mg total) by mouth 2 (two) times daily as needed for nausea or vomiting. Can start using on day 3 after chemo (Patient not taking: Reported on 03/21/2019) 20 tablet 0   prochlorperazine (COMPAZINE) 10 MG tablet Take 1 tablet (10 mg total) by mouth every 6 (six) hours as needed for nausea or vomiting. (Patient not taking: Reported on 03/21/2019) 30 tablet 0   No current facility-administered medications for this visit.    Facility-Administered Medications Ordered in Other Visits  Medication Dose Route Frequency Provider Last Rate Last Dose   heparin lock flush 100 unit/mL  500 Units Intravenous Once Sindy Guadeloupe, MD       heparin lock flush 100 unit/mL  500 Units Intravenous Once Sindy Guadeloupe, MD       sodium chloride flush (NS) 0.9 % injection 10 mL  10 mL Intravenous PRN Sindy Guadeloupe, MD   10 mL at 02/06/18 0845   sodium chloride flush (NS) 0.9 % injection 10 mL  10 mL Intravenous PRN Sindy Guadeloupe, MD   10 mL at 03/27/18 0909    Review of Systems: Review of Systems    Constitutional: Positive for weight loss (weight is 163.6, down 4 lbs). Negative for chills, fever and malaise/fatigue.       Notes "no problems".  HENT: Negative.  Negative for congestion, ear discharge, ear pain, nosebleeds, sinus pain and sore throat.   Eyes: Negative.  Negative for blurred vision, double vision and photophobia.  Respiratory: Negative.  Negative for cough, hemoptysis, sputum production and shortness of breath.   Cardiovascular: Negative for chest pain, palpitations and orthopnea.       H/o asymptomatic bradycardia.   Gastrointestinal: Positive for abdominal pain (intermittent) and constipation (BMs every other day, managed with prune juice). Negative for diarrhea, nausea and vomiting.       Eating well.   Genitourinary: Negative.  Negative for dysuria, frequency, hematuria and urgency.  Musculoskeletal: Negative.  Negative for back pain, joint pain, myalgias and neck pain.  Skin:       Skin breakdown to sacrum, stable to improved.  Neurological: Negative for dizziness, tingling, sensory change, focal weakness, weakness and headaches.  Endo/Heme/Allergies: Negative.  Does not bruise/bleed easily.  Psychiatric/Behavioral: Negative for depression and memory loss. The patient is not nervous/anxious and does not have insomnia.        Schizophrenia   Performance status (ECOG):  1-2  Physical Exam  Constitutional: She is oriented to person, place, and time. She appears well-developed and well-nourished.  HENT:  Wearing a cap.  Short thin graying hair.   Eyes: Pupils are equal, round, and reactive to light. Conjunctivae and EOM are normal.  Glasses.  Blue eyes.  Neurological: She is alert and oriented to person, place, and time.  Psychiatric: She has a normal mood and affect. Her behavior is normal. Judgment normal.  Nursing note reviewed.    Imaging studies: 12/03/2017:  Abdomen and pelvic CT revealed a large cecal mass causing secondary appendicitis. 12/03/2017:  CT  chest without contrast revealed tiny 2 mm right upper lobe pulmonary nodule.  04/20/2018:  Chest, abdomen, and pelvic CT revealed peritoneal nodularity within the right pericolic gutter which may represent postsurgical change and/or residual disease. There was a soft tissue mass which appeared to be involving the distal small bowel within the right lower quadrant which may represent residual/recurrent disease. There was soft tissue irregularity and fluid within the right adnexa which may represent postsurgical changes or residual/recurrent disease. 07/27/2018:  Chest, abdomen and pelvic CT revealed interval increase in size of right hepatic lobe lesion concerning hepatic metastatic disease.  There was similar-appearing nodularity within the right pericolic gutter which may represent metastatic disease or postsurgical changes.  There was similar-appearing soft tissue mass within the distal small bowel concerning for residual disease.  There was stable bilateral adrenal nodules.  There was interval development of patchy ground-glass opacities predominantly within the upper lobes bilaterally, favored to be infectious/inflammatory in etiology.  10/24/2018:  Chest, abdomen, and pelvic CT revealed no pulmonary metastatic disease.  There was slight interval decrease in size of the right hepatic lobe metastatic lesion.  There were no new lesions or progressive findings.  There was stable soft tissue lesions involving the colon near the anastomosis and also 2 separate right lower quadrant small bowel loops.  There were no new or progressive findings.  There were stable small scattered adjacent mesenteric nodes. 03/02/2019:  Chest, abdomen, and pelvic CT revealed progressive disease within the abdomen and pelvis. There was increase in soft tissue thickening along the surgical site at the ileocolic junction (2.9 x 1.9 cm to 4.6 x 2.5 cm).  There was new and progressive peritoneal metastasis (2.1 x 2.4 cm).  There was no  bowel obstruction or other acute complication.  There was enlarging soft tissue metastasis on the right pelvic oblique musculature (1.1 cm to 1.7 cm).  Appointment on 03/20/2019  Component Date Value Ref Range Status   Sodium 03/20/2019 132* 135 - 145 mmol/L Final   Potassium 03/20/2019 4.3  3.5 - 5.1 mmol/L Final   Chloride 03/20/2019 101  98 - 111 mmol/L Final   CO2 03/20/2019 22  22 - 32 mmol/L Final   Glucose, Bld 03/20/2019 160* 70 - 99 mg/dL Final   BUN 03/20/2019 11  8 - 23 mg/dL Final   Creatinine, Ser 03/20/2019 0.66  0.44 - 1.00  mg/dL Final   Calcium 03/20/2019 9.2  8.9 - 10.3 mg/dL Final   Total Protein 03/20/2019 6.9  6.5 - 8.1 g/dL Final   Albumin 03/20/2019 3.4* 3.5 - 5.0 g/dL Final   AST 03/20/2019 20  15 - 41 U/L Final   ALT 03/20/2019 14  0 - 44 U/L Final   Alkaline Phosphatase 03/20/2019 86  38 - 126 U/L Final   Total Bilirubin 03/20/2019 0.6  0.3 - 1.2 mg/dL Final   GFR calc non Af Amer 03/20/2019 >60  >60 mL/min Final   GFR calc Af Amer 03/20/2019 >60  >60 mL/min Final   Anion gap 03/20/2019 9  5 - 15 Final   Performed at [facility], [address] [place], [place] [address]   WBC 03/20/2019 7.9  4.0 - 10.5 K/uL Final   RBC 03/20/2019 3.54* 3.87 - 5.11 MIL/uL Final   Hemoglobin 03/20/2019 11.0* 12.0 - 15.0 g/dL Final   HCT 03/20/2019 34.0* 36.0 - 46.0 % Final   MCV 03/20/2019 96.0  80.0 - 100.0 fL Final   MCH 03/20/2019 31.1  26.0 - 34.0 pg Final   MCHC 03/20/2019 32.4  30.0 - 36.0 g/dL Final   RDW 03/20/2019 14.9  11.5 - 15.5 % Final   Platelets 03/20/2019 234  150 - 400 K/uL Final   nRBC 03/20/2019 0.0  0.0 - 0.2 % Final   Neutrophils Relative % 03/20/2019 76  % Final   Neutro Abs 03/20/2019 6.0  1.7 - 7.7 K/uL Final   Lymphocytes Relative 03/20/2019 18  % Final   Lymphs Abs 03/20/2019 1.4  0.7 - 4.0 K/uL Final   Monocytes Relative 03/20/2019 3  % Final   Monocytes Absolute 03/20/2019 0.3  0.1 - 1.0 K/uL Final    Eosinophils Relative 03/20/2019 1  % Final   Eosinophils Absolute 03/20/2019 0.1  0.0 - 0.5 K/uL Final   Basophils Relative 03/20/2019 1  % Final   Basophils Absolute 03/20/2019 0.1  0.0 - 0.1 K/uL Final   Immature Granulocytes 03/20/2019 1  % Final   Abs Immature Granulocytes 03/20/2019 0.06  0.00 - 0.07 K/uL Final   Performed at [facility], [address] [place], [place] [address]    Assessment:  Latoya Jones is a 73 y.o. female with stage IV colon cancer with peritoneal metastasis and possible liver metastasis.  She is s/p exploratory laparotomy with right hemicolectomy and terminal ileum resection and separate small bowel resection on 12/06/2017. 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 Radial margin was positive for invasive carcinoma.  There were 2 colon adenomas (2.5 and 1.8 cm) of the ascending colon. Metastatic adenocarcinoma was seen in 4 of 13 lymph nodes. There were 2 tumor deposits.  There was a segment of small intestine with abscess which also showed adenocarcinoma invading the distal appendix with perforation. One mesenteric lymph node was negative for malignancy. Retroperitoneal tumor removal was positive for mucinous adenocarcinoma. Proximal and distal margins were negative.  Pathologic stage was [id number]. MSI stable. KRAS mutation was positive  She received 5 cycles of FOLFOX (01/10/2018 - 03/27/2018).  She began Neulasta with cycle #2.  Oxaliplatin was decreased from 85 mg/m2 to 65 mg/m2 with cycle #4.  She received 6 cycles of 5FU + LV with Avastin (05/01/2018 - 07/17/2018).  She received 10 cycles of FOLIFIRI + Avastin (07/31/2018 - 02/13/2019).  She receives chemotherapy every 3 weeks secondary to tolerance.  Irinotecan was  decreased from 150 mg/m2 to 125 mg/m2 with cycle #2.  5FU bolus was discontinued with cycle #6.  Chest, abdomen, and pelvic CT on 03/02/2019 revealed  progressive disease within the abdomen and pelvis. There was increase in soft tissue thickening along the surgical site at the ileocolic junction (2.9 x 1.9 cm to 4.6 x 2.5 cm).  There was new and progressive peritoneal metastasis (2.1 x 2.4 cm).  There was no bowel obstruction or other acute complication.  There was enlarging soft tissue metastasis on the right pelvic oblique musculature (1.1 cm to 1.7 cm).  She is day 8 of cycle #1 Lonsurf (began 03/14/2019).  CEA has been followed: 4.8 on 12/04/2017, 3.1 on 01/10/2018, 4.2 on 03/06/2018, 3.8 on 05/01/2018, 9.2 on 07/31/2018, 5.9 on 09/18/2018, and 5.0 on 10/30/2018.  She has macrocytic RBC indices.  She has a history of a low B12 level.  B12 was 266 on 01/30/2018 and 2448 on 01/22/2019.  Folate was 32 on 01/30/2018 and 45 on 01/22/2019.  TSH was 0.913 on 12/03/2017 and 1.627 on 01/22/2019.  She has paranoid schizophrenia with delusions.  She lives in a group home.  Symptomatically, she is doing well.  She is tolerating Lonsurf well without side effects.  She has some constipation and abdominal discomfort.  Plan: 1.   Review labs from 03/20/2019. 2.   Stage IV colon cancer She has received             FOLFOX then 5FU + LV + Avastin             FOLFIRI + Avastin She is day 8 of cycle #1 Lonsurf.   No perceived side effects.   Continue to monitor weekly with first cycle.                           3.   Constipation Bowel movement appear well managed with prune juice. Unclear if abdominal discomfort is due to constipation or peritoneal metastasis. 4.   Stage I sacral decubitus Apparent grade I decubitus ulcer. Continue topical care. Follow-up at next visit in person. 5.   RTC on 03/27/2019 for labs (CBC with diff, CMP). 6.   RTC on 03/28/2019 for MD assessment (Doximity). 7.   RTC on 04/13/2019 for MD assessment (in clinic), labs (CBC with diff, CMP, Mg, CEA), prior to initiation of cycle #2 Lonsurf (Monday 04/16/2019).   I  discussed the assessment and treatment plan with the patient.  The patient was provided an opportunity to ask questions and all were answered.  The patient agreed with the plan and demonstrated an understanding of the instructions.  The patient was advised to call back or seek an in person evaluation if the symptoms worsen or if the condition fails to improve as anticipated.  I provided 15 minutes (8:59 AM - 9:14 AM) of face-to-face video visit time during this this encounter and > 50% was spent counseling as documented under my assessment and plan.  I provided these services from the [facility] office.   Nolon Stalls, MD PhD  03/21/2019, 8:59 AM  I, Molly Dorshimer, am acting as Education administrator for [employer]. Mike Gip, MD, PhD.  I, Shaquetta Arcos C. Mike Gip, MD, have reviewed the above documentation for accuracy and completeness, and I agree with the above.

## 2019-03-21 ENCOUNTER — Encounter: Payer: Self-pay | Admitting: Hematology and Oncology

## 2019-03-21 ENCOUNTER — Telehealth: Payer: Self-pay | Admitting: Hematology and Oncology

## 2019-03-21 ENCOUNTER — Inpatient Hospital Stay (HOSPITAL_BASED_OUTPATIENT_CLINIC_OR_DEPARTMENT_OTHER): Payer: Medicare Other | Admitting: Hematology and Oncology

## 2019-03-21 DIAGNOSIS — C189 Malignant neoplasm of colon, unspecified: Secondary | ICD-10-CM | POA: Diagnosis not present

## 2019-03-21 DIAGNOSIS — Z87891 Personal history of nicotine dependence: Secondary | ICD-10-CM

## 2019-03-21 DIAGNOSIS — C787 Secondary malignant neoplasm of liver and intrahepatic bile duct: Secondary | ICD-10-CM | POA: Diagnosis not present

## 2019-03-21 DIAGNOSIS — L89151 Pressure ulcer of sacral region, stage 1: Secondary | ICD-10-CM

## 2019-03-21 DIAGNOSIS — C786 Secondary malignant neoplasm of retroperitoneum and peritoneum: Secondary | ICD-10-CM | POA: Diagnosis not present

## 2019-03-21 DIAGNOSIS — Z7983 Long term (current) use of bisphosphonates: Secondary | ICD-10-CM

## 2019-03-21 DIAGNOSIS — K5900 Constipation, unspecified: Secondary | ICD-10-CM

## 2019-03-21 DIAGNOSIS — Z79899 Other long term (current) drug therapy: Secondary | ICD-10-CM

## 2019-03-21 NOTE — Progress Notes (Signed)
No new changes noted today. The patient is doing well with new medication. The patient Name, DOB and address has been verified by phone today. Cbg/ CMA

## 2019-03-26 ENCOUNTER — Other Ambulatory Visit: Payer: Self-pay

## 2019-03-27 ENCOUNTER — Inpatient Hospital Stay: Payer: Medicare Other | Attending: Hematology and Oncology

## 2019-03-27 DIAGNOSIS — C786 Secondary malignant neoplasm of retroperitoneum and peritoneum: Secondary | ICD-10-CM | POA: Diagnosis not present

## 2019-03-27 DIAGNOSIS — L89151 Pressure ulcer of sacral region, stage 1: Secondary | ICD-10-CM | POA: Insufficient documentation

## 2019-03-27 DIAGNOSIS — I10 Essential (primary) hypertension: Secondary | ICD-10-CM | POA: Insufficient documentation

## 2019-03-27 DIAGNOSIS — K5900 Constipation, unspecified: Secondary | ICD-10-CM | POA: Insufficient documentation

## 2019-03-27 DIAGNOSIS — Z79899 Other long term (current) drug therapy: Secondary | ICD-10-CM | POA: Insufficient documentation

## 2019-03-27 DIAGNOSIS — Z87891 Personal history of nicotine dependence: Secondary | ICD-10-CM | POA: Diagnosis not present

## 2019-03-27 DIAGNOSIS — C189 Malignant neoplasm of colon, unspecified: Secondary | ICD-10-CM | POA: Diagnosis not present

## 2019-03-27 DIAGNOSIS — C787 Secondary malignant neoplasm of liver and intrahepatic bile duct: Secondary | ICD-10-CM | POA: Diagnosis not present

## 2019-03-27 LAB — COMPREHENSIVE METABOLIC PANEL
ALT: 13 U/L (ref 0–44)
AST: 20 U/L (ref 15–41)
Albumin: 3.4 g/dL — ABNORMAL LOW (ref 3.5–5.0)
Alkaline Phosphatase: 79 U/L (ref 38–126)
Anion gap: 8 (ref 5–15)
BUN: 12 mg/dL (ref 8–23)
CO2: 22 mmol/L (ref 22–32)
Calcium: 9.3 mg/dL (ref 8.9–10.3)
Chloride: 102 mmol/L (ref 98–111)
Creatinine, Ser: 0.67 mg/dL (ref 0.44–1.00)
GFR calc Af Amer: >60 mL/min (ref >60)
GFR calc non Af Amer: >60 mL/min (ref >60)
Glucose, Bld: 123 mg/dL — ABNORMAL HIGH (ref 70–99)
Potassium: 3.8 mmol/L (ref 3.5–5.1)
Sodium: 132 mmol/L — ABNORMAL LOW (ref 135–145)
Total Bilirubin: 0.9 mg/dL (ref 0.3–1.2)
Total Protein: 6.7 g/dL (ref 6.5–8.1)

## 2019-03-27 LAB — CBC WITH DIFFERENTIAL/PLATELET
Abs Immature Granulocytes: 0.03 10*3/uL (ref 0.00–0.07)
Basophils Absolute: 0 10*3/uL (ref 0.0–0.1)
Basophils Relative: 1 %
Eosinophils Absolute: 0 10*3/uL (ref 0.0–0.5)
Eosinophils Relative: 1 %
HCT: 30.1 % — ABNORMAL LOW (ref 36.0–46.0)
Hemoglobin: 9.9 g/dL — ABNORMAL LOW (ref 12.0–15.0)
Immature Granulocytes: 1 %
Lymphocytes Relative: 21 %
Lymphs Abs: 0.9 10*3/uL (ref 0.7–4.0)
MCH: 30.7 pg (ref 26.0–34.0)
MCHC: 32.9 g/dL (ref 30.0–36.0)
MCV: 93.5 fL (ref 80.0–100.0)
Monocytes Absolute: 0.1 10*3/uL (ref 0.1–1.0)
Monocytes Relative: 3 %
Neutro Abs: 3 10*3/uL (ref 1.7–7.7)
Neutrophils Relative %: 73 %
Platelets: 127 10*3/uL — ABNORMAL LOW (ref 150–400)
RBC: 3.22 MIL/uL — ABNORMAL LOW (ref 3.87–5.11)
RDW: 15 % (ref 11.5–15.5)
WBC: 4.1 10*3/uL (ref 4.0–10.5)
nRBC: 0 % (ref 0.0–0.2)

## 2019-03-27 NOTE — Progress Notes (Signed)
[facility]  [address] [place], [place] [address] Phone: [phone]  Fax: [phone]   Telemedicine Office Visit:  03/28/2019  Referring physician: Gae Bon, NP  I connected with Latoya Jones on 03/28/2019 at 4:47 PM by videoconferencing and verified that I was speaking with the correct person using 2 identifiers.  The patient was at the assisted living facility.  I discussed the limitations, risk, security and privacy concerns of performing an evaluation and management service by videoconferencing and the availability of in person appointments.  I also discussed with the patient that there may be a patient responsible charge related to this service.  The patient expressed understanding and agreed to proceed.   Chief Complaint: Latoya Jones is a 73 y.o. female with stage IV colon cancer who is seen forassessment on day 15 of cycle #1 Lonsurf.  HPI: The patient was last seen in the oncology clinic on 03/21/2019. At that time, she was doing well.  She was tolerating Lonsurf well without side effects.  She had some constipation and abdominal discomfort. She began day 8-12 of Lonsurf.   CBC 03/27/2019 revealed a WBC 4,100, hemoglobin 9.9, hematocrit 30.1, platelets 127,000. Sodium was 132.  Albumin 3.4.   During the interim, the patient is doing well. She denies any nausea, vomiting. She was constipated and had pain with BMs last week. She took some prune juice and has had daily BMs since that time. She denies any bleeding. She denies any fever or infections.   She completed days 8-12 of Lonsurf on 03/25/2019.  She requested a Zofran refill as the current pills she has have expired and she has not needed to use any.    Past Medical History:  Diagnosis Date  . Colon cancer ([place])   . Hypertension     Past Surgical History:  Procedure Laterality Date  . COLOSTOMY REVISION N/A 12/06/2017   Procedure: COLON RESECTION RIGHT;  Surgeon:  Florene Glen, MD;  Location: [facility] ORS;  Service: General;  Laterality: N/A;  . DILATION AND CURETTAGE, DIAGNOSTIC / THERAPEUTIC    . PORTACATH PLACEMENT N/A 01/06/2018   Procedure: INSERTION PORT-A-CATH;  Surgeon: Vickie Epley, MD;  Location: [facility] ORS;  Service: General;  Laterality: N/A;    Family History  Problem Relation Age of Onset  . ALS Mother   . ALS Father     Social History:  reports that she quit smoking about 11 years ago. She has never used smokeless tobacco. She reports that she does not drink alcohol or use drugs. She lives in a family care home; generally accompanied by [name]. She has her own room at the care home.  The patient is accompanied by [name] today.  Participants in the patient's visit and their role in the encounter included the patient, [name], and Waymon Budge, RN today.  The intake visit was provided by Waymon Budge, RN.  Allergies: No Known Allergies  Current Medications: Current Outpatient Medications  Medication Sig Dispense Refill  . acetaminophen (TYLENOL) 500 MG tablet Take 1,000 mg by mouth every 8 (eight) hours as needed for mild pain.    Marland Kitchen alendronate (FOSAMAX) 70 MG tablet Take 70 mg by mouth once a week.    Marland Kitchen amLODipine (NORVASC) 10 MG tablet Take 10 mg by mouth daily.     . ARIPiprazole (ABILIFY) 15 MG tablet Take 7.5 mg by mouth daily.    Marland Kitchen atorvastatin (LIPITOR) 20 MG tablet Take 1 tablet by  mouth daily.    . calcium carbonate (TUMS - DOSED IN MG ELEMENTAL CALCIUM) 500 MG chewable tablet Chew 1 tablet by mouth 2 (two) times daily as needed for indigestion or heartburn.    . Calcium Carbonate-Vitamin D3 (CALCIUM 600-D) 600-400 MG-UNIT TABS Take 1 tablet by mouth daily.    . memantine (NAMENDA XR) 14 MG CP24 24 hr capsule Take 14 mg by mouth at bedtime.     . metoprolol succinate (TOPROL-XL) 50 MG 24 hr tablet Take 25 mg by mouth every morning. Take with or immediately following a meal.     . Multiple  Vitamins-Minerals (SENTRY SENIOR PO) Take 1 tablet by mouth daily.    . ondansetron (ZOFRAN) 8 MG tablet Take 1 tablet (8 mg total) by mouth 2 (two) times daily as needed for nausea or vomiting. Can start using on day 3 after chemo 20 tablet 0  . perphenazine (TRILAFON) 4 MG tablet Take 4 mg by mouth 2 (two) times daily.    . prochlorperazine (COMPAZINE) 10 MG tablet Take 1 tablet (10 mg total) by mouth every 6 (six) hours as needed for nausea or vomiting. 30 tablet 0  . lidocaine-prilocaine (EMLA) cream Apply 1 application topically as needed. 1 hour prior to  each chemotherapy treatment. Place small amount over port site and place saran wrap over the cream to protect clothing (Patient not taking: Reported on 03/13/2019) 30 g 0  . loratadine (CLARITIN) 10 MG tablet Take 1 tablet (10 mg total) by mouth daily as needed for allergies. Take 1 tablet the day before pump removal, 1 tablet the day of pump removal and 1 tablet the day after pump removal. Take as directed with each cycle of  chemotherapy treatment (Patient not taking: Reported on 03/28/2019) 30 tablet 1  . trifluridine-tipiracil (LONSURF) 20-8.19 MG tablet Take 3 tablets (60 mg of trifluridine total) by mouth 2 (two) times daily after a meal. Take on days 1-5, 8-12. Repeat every 28days. (Patient not taking: Reported on 03/28/2019) 60 tablet 0   No current facility-administered medications for this visit.    Facility-Administered Medications Ordered in Other Visits  Medication Dose Route Frequency Provider Last Rate Last Dose  . heparin lock flush 100 unit/mL  500 Units Intravenous Once Sindy Guadeloupe, MD      . heparin lock flush 100 unit/mL  500 Units Intravenous Once Sindy Guadeloupe, MD      . sodium chloride flush (NS) 0.9 % injection 10 mL  10 mL Intravenous PRN Sindy Guadeloupe, MD   10 mL at 02/06/18 0845  . sodium chloride flush (NS) 0.9 % injection 10 mL  10 mL Intravenous PRN Sindy Guadeloupe, MD   10 mL at 03/27/18 0909    Review of  Systems  Constitutional: Positive for weight loss (4 pounds; weight is 163.6 pounds). Negative for chills, fever and malaise/fatigue.       Doing well.  HENT: Negative.  Negative for congestion, ear discharge, ear pain, nosebleeds, sinus pain, sore throat and tinnitus.   Eyes: Negative.  Negative for blurred vision, double vision and photophobia.  Respiratory: Negative.  Negative for cough, hemoptysis, sputum production and shortness of breath.   Cardiovascular: Negative for chest pain, palpitations, orthopnea, leg swelling and PND.       H/o asymptomatic bradycardia.   Gastrointestinal: Positive for abdominal pain (intermittent) and constipation (BMs daily, managed with prune juice). Negative for blood in stool, diarrhea, melena, nausea and vomiting.  Eating well.   Genitourinary: Negative.  Negative for dysuria, frequency, hematuria and urgency.  Musculoskeletal: Negative.  Negative for back pain, joint pain, myalgias and neck pain.  Skin: Negative for itching and rash.       Minor skin breakdown to sacrum; using barrier cream.  Neurological: Negative.  Negative for dizziness, tingling, sensory change, focal weakness, weakness and headaches.  Endo/Heme/Allergies: Negative.  Does not bruise/bleed easily.  Psychiatric/Behavioral: Negative for depression and memory loss. The patient is not nervous/anxious and does not have insomnia.        Schizophrenia.    Performance status (ECOG): 1-2  Physical Exam  Constitutional: She is oriented to person, place, and time. She appears well-developed and well-nourished. No distress.  HENT:  Wearing a cap.  Short thin graying hair.   Eyes: Conjunctivae and EOM are normal.  Glasses.  Blue eyes.  Neurological: She is alert and oriented to person, place, and time.  Skin: She is not diaphoretic.  Psychiatric: She has a normal mood and affect. Her behavior is normal.  Nursing note reviewed.   Appointment on 03/27/2019  Component Date Value Ref  Range Status  . Sodium 03/27/2019 132* 135 - 145 mmol/L Final  . Potassium 03/27/2019 3.8  3.5 - 5.1 mmol/L Final  . Chloride 03/27/2019 102  98 - 111 mmol/L Final  . CO2 03/27/2019 22  22 - 32 mmol/L Final  . Glucose, Bld 03/27/2019 123* 70 - 99 mg/dL Final  . BUN 03/27/2019 12  8 - 23 mg/dL Final  . Creatinine, Ser 03/27/2019 0.67  0.44 - 1.00 mg/dL Final  . Calcium 03/27/2019 9.3  8.9 - 10.3 mg/dL Final  . Total Protein 03/27/2019 6.7  6.5 - 8.1 g/dL Final  . Albumin 03/27/2019 3.4* 3.5 - 5.0 g/dL Final  . AST 03/27/2019 20  15 - 41 U/L Final  . ALT 03/27/2019 13  0 - 44 U/L Final  . Alkaline Phosphatase 03/27/2019 79  38 - 126 U/L Final  . Total Bilirubin 03/27/2019 0.9  0.3 - 1.2 mg/dL Final  . GFR calc non Af Amer 03/27/2019 >60  >60 mL/min Final  . GFR calc Af Amer 03/27/2019 >60  >60 mL/min Final  . Anion gap 03/27/2019 8  5 - 15 Final   Performed at [facility], [address], [place], [place] [address]  . WBC 03/27/2019 4.1  4.0 - 10.5 K/uL Final  . RBC 03/27/2019 3.22* 3.87 - 5.11 MIL/uL Final  . Hemoglobin 03/27/2019 9.9* 12.0 - 15.0 g/dL Final  . HCT 03/27/2019 30.1* 36.0 - 46.0 % Final  . MCV 03/27/2019 93.5  80.0 - 100.0 fL Final  . MCH 03/27/2019 30.7  26.0 - 34.0 pg Final  . MCHC 03/27/2019 32.9  30.0 - 36.0 g/dL Final  . RDW 03/27/2019 15.0  11.5 - 15.5 % Final  . Platelets 03/27/2019 127* 150 - 400 K/uL Final  . nRBC 03/27/2019 0.0  0.0 - 0.2 % Final  . Neutrophils Relative % 03/27/2019 73  % Final  . Neutro Abs 03/27/2019 3.0  1.7 - 7.7 K/uL Final  . Lymphocytes Relative 03/27/2019 21  % Final  . Lymphs Abs 03/27/2019 0.9  0.7 - 4.0 K/uL Final  . Monocytes Relative 03/27/2019 3  % Final  . Monocytes Absolute 03/27/2019 0.1  0.1 - 1.0 K/uL Final  . Eosinophils Relative 03/27/2019 1  % Final  . Eosinophils Absolute 03/27/2019 0.0  0.0 - 0.5 K/uL Final  . Basophils Relative 03/27/2019  1  % Final  . Basophils Absolute 03/27/2019 0.0  0.0 - 0.1 K/uL  Final  . Immature Granulocytes 03/27/2019 1  % Final  . Abs Immature Granulocytes 03/27/2019 0.03  0.00 - 0.07 K/uL Final   Performed at [facility], [address] [place], [place] [address]    Assessment:  Latoya Jones is a 73 y.o. female with stage IV colon cancerwith peritoneal metastasis and possible liver metastasis. She is s/p exploratory laparotomy with right hemicolectomy and terminal ileum resection and separate small bowel resection on 12/06/2017. 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 Radial margin was positive for invasive carcinoma. There were 2 colon adenomas (2.5 and 1.8 cm) of the ascending colon. Metastatic adenocarcinoma was seen in 4 of 13 lymph nodes. There were 2 tumor deposits. There was a segment of small intestine with abscess which also showed adenocarcinoma invading the distal appendix with perforation. One mesenteric lymph node was negative for malignancy. Retroperitoneal tumor removal was positive for mucinous adenocarcinoma. Proximal and distal margins were negative. Pathologic stagewas [id number]. MSI stable. KRASmutation was positive  She received 5 cycles of FOLFOX(01/10/2018 - 03/27/2018). She began Neulasta with cycle #2. Oxaliplatin was decreased from 85 mg/m2 to 65 mg/m2 with cycle #4. She received 6 cycles of5FU + LV with Avastin(05/01/2018 - 07/17/2018).  She received 10 cycles of FOLIFIRI + Avastin(07/31/2018 - 02/13/2019). She receives chemotherapy every 3 weeks secondary to tolerance. Irinotecan was decreased from 150 mg/m2 to 125 mg/m2 with cycle #2. 5FU bolus was discontinued with cycle #6.  Chest, abdomen, and pelvic CTon 03/02/2019 revealed progressive disease within the abdomen and pelvis. There was increase in soft tissue thickening along the surgical site at the ileocolic junction (2.9 x 1.9 cm to 4.6 x 2.5 cm). There was new and progressive  peritoneal metastasis (2.1 x 2.4 cm). There was no bowel obstruction or other acute complication. There was enlarging soft tissue metastasis on the right pelvic oblique musculature (1.1 cm to 1.7 cm).  She is day 15 of cycle #1 Lonsurf (began 03/14/2019).  CEAhas been followed: 4.8 on 12/04/2017, 3.1 on 01/10/2018, 4.2 on 03/06/2018, 3.8 on 05/01/2018, 9.2 on 07/31/2018, 5.9 on 09/18/2018, and 5.0 on 10/30/2018.  She has macrocytic RBC indices. She has a history of alow B12 level. B12 was 266 on 01/30/2018 and 2448 on 01/22/2019. Folatewas 32 on 01/30/2018 and 45 on 01/22/2019. TSHwas 0.913 on 12/03/2017 and 1.627 on 01/22/2019.  She has paranoid schizophrenia with delusions. She lives in a group home.  Symptomatically, she is doing well.  She has had some issues with constipation.  She denies any nausea or vomiting.  WBC is 4100 (ANC 300).  Platelet count is 127,000.  Plan: 1.   Review labs from 03/27/2019. 2.   Stage IV colon cancer She has received FOLFOX then 5FU + LV + Avastin FOLFIRI + Avastin She is day 15 of cycle #1 Lonsurf. She tolerated treatment well without side effect. Lonsurf is complete for cycle #1. Discuss plan for 2 weeks off. Rx:  ondansetron 8 mg po q 8 hrs prn (old Rx expired). Discuss symptom management.  She has antiemetics and pain medications at home to use on a prn bases.  Interventions are adequate.    3.   Constipation Discuss use of stool softeners, laxatives, and "p" fruits (peaches, plums, pineapples, prunes). Discuss avoidance of enemas secondary to bacteremia during nadir (leukopenia). Unclear if abdominal discomfort is due to constipation or peritoneal  metastasis. 4.   Stage I sacral decubitus Grade I decubitus ulcer by report. Continue topical care. Follow-up at next visit in person. 5.   RTC in 1 week for abs (CBC with diff, CMP). 6.   RTC on 04/13/2019 for MD assessment, labs (CBC with  diff, CMP, Mg, CEA), prior to initiation of cycle #2 Lonsurf on Monday, 04/16/2019.  I discussed the assessment and treatment plan with the patient.  The patient was provided an opportunity to ask questions and all were answered.  The patient agreed with the plan and demonstrated an understanding of the instructions.  The patient was advised to call back or seek an in person evaluation if the symptoms worsen or if the condition fails to improve as anticipated.  I provided 14 minutes (4:47 PM - 5:01PM) of face-to-face video visit time during this this encounter and > 50% was spent counseling as documented under my assessment and plan. I provided these services from the [facility] office.  Nolon Stalls, MD, PhD  03/28/2019, 4:47 PM  I, Molly Dorshimer, am acting as Education administrator for [employer]. Mike Gip, MD, PhD.  I,  C. Mike Gip, MD, have reviewed the above documentation for accuracy and completeness, and I agree with the above.

## 2019-03-28 ENCOUNTER — Encounter: Payer: Self-pay | Admitting: Hematology and Oncology

## 2019-03-28 ENCOUNTER — Inpatient Hospital Stay (HOSPITAL_BASED_OUTPATIENT_CLINIC_OR_DEPARTMENT_OTHER): Payer: Medicare Other | Admitting: Hematology and Oncology

## 2019-03-28 DIAGNOSIS — C189 Malignant neoplasm of colon, unspecified: Secondary | ICD-10-CM

## 2019-03-28 DIAGNOSIS — C786 Secondary malignant neoplasm of retroperitoneum and peritoneum: Secondary | ICD-10-CM | POA: Diagnosis not present

## 2019-03-28 DIAGNOSIS — D696 Thrombocytopenia, unspecified: Secondary | ICD-10-CM | POA: Diagnosis not present

## 2019-03-28 DIAGNOSIS — L89151 Pressure ulcer of sacral region, stage 1: Secondary | ICD-10-CM

## 2019-03-28 DIAGNOSIS — C787 Secondary malignant neoplasm of liver and intrahepatic bile duct: Secondary | ICD-10-CM | POA: Diagnosis not present

## 2019-03-28 DIAGNOSIS — K5900 Constipation, unspecified: Secondary | ICD-10-CM

## 2019-03-28 MED ORDER — ONDANSETRON HCL 8 MG PO TABS: 8.0000 mg | ORAL_TABLET | Freq: Two times a day (BID) | ORAL | 0 refills | Status: AC | PRN

## 2019-03-28 NOTE — Progress Notes (Signed)
Confirmed name, DOB, and address. Assessment completed with assistants from [place], Building control surveyor. Denies any concerns. Reports both Zofran and Compazine have expired, however patient has not had to take any and does not have any concerns with nausea.

## 2019-04-02 ENCOUNTER — Other Ambulatory Visit: Payer: Self-pay | Admitting: Hematology and Oncology

## 2019-04-02 DIAGNOSIS — C189 Malignant neoplasm of colon, unspecified: Secondary | ICD-10-CM

## 2019-04-04 ENCOUNTER — Telehealth: Payer: Self-pay

## 2019-04-04 ENCOUNTER — Other Ambulatory Visit: Payer: Self-pay

## 2019-04-04 ENCOUNTER — Inpatient Hospital Stay: Payer: Medicare Other

## 2019-04-04 DIAGNOSIS — C189 Malignant neoplasm of colon, unspecified: Secondary | ICD-10-CM | POA: Diagnosis not present

## 2019-04-04 LAB — CBC WITH DIFFERENTIAL/PLATELET
Abs Immature Granulocytes: 0.01 10*3/uL (ref 0.00–0.07)
Basophils Absolute: 0 10*3/uL (ref 0.0–0.1)
Basophils Relative: 0 %
Eosinophils Absolute: 0 10*3/uL (ref 0.0–0.5)
Eosinophils Relative: 1 %
HCT: 28.4 % — ABNORMAL LOW (ref 36.0–46.0)
Hemoglobin: 9.2 g/dL — ABNORMAL LOW (ref 12.0–15.0)
Immature Granulocytes: 0 %
Lymphocytes Relative: 51 %
Lymphs Abs: 1.4 10*3/uL (ref 0.7–4.0)
MCH: 30.5 pg (ref 26.0–34.0)
MCHC: 32.4 g/dL (ref 30.0–36.0)
MCV: 94 fL (ref 80.0–100.0)
Monocytes Absolute: 0.5 10*3/uL (ref 0.1–1.0)
Monocytes Relative: 17 %
Neutro Abs: 0.8 10*3/uL — ABNORMAL LOW (ref 1.7–7.7)
Neutrophils Relative %: 31 %
Platelets: 141 10*3/uL — ABNORMAL LOW (ref 150–400)
RBC: 3.02 MIL/uL — ABNORMAL LOW (ref 3.87–5.11)
RDW: 16.4 % — ABNORMAL HIGH (ref 11.5–15.5)
WBC: 2.7 10*3/uL — ABNORMAL LOW (ref 4.0–10.5)
nRBC: 0 % (ref 0.0–0.2)

## 2019-04-04 LAB — BASIC METABOLIC PANEL
Anion gap: 8 (ref 5–15)
BUN: 13 mg/dL (ref 8–23)
CO2: 24 mmol/L (ref 22–32)
Calcium: 9.2 mg/dL (ref 8.9–10.3)
Chloride: 102 mmol/L (ref 98–111)
Creatinine, Ser: 0.62 mg/dL (ref 0.44–1.00)
GFR calc Af Amer: >60 mL/min (ref >60)
GFR calc non Af Amer: >60 mL/min (ref >60)
Glucose, Bld: 100 mg/dL — ABNORMAL HIGH (ref 70–99)
Potassium: 4.1 mmol/L (ref 3.5–5.1)
Sodium: 134 mmol/L — ABNORMAL LOW (ref 135–145)

## 2019-04-04 NOTE — Telephone Encounter (Signed)
-----   Message from Lequita Asal, MD sent at 04/04/2019  1:14 PM EDT ----- Regarding: Please call [name]  Let her know about low WBC.  Neutropenic precautions.  M  ----- Message ----- From: Buel Ream, Lab In [place] Sent: 04/04/2019  10:57 AM EDT To: Lequita Asal, MD

## 2019-04-04 NOTE — Telephone Encounter (Signed)
Spoke with ms [name] to inform her that ms Frayre WBC has dropped to 2.7 . I have instruced her to make sure she stay away from sick peolpe and wash her hand at all times. Stay clear of all sick people and stay inside as much as possible. Ms [name] was understanding and agreeable to keep Ms Matson in a safe mode for now.

## 2019-04-09 MED FILL — LONSURF 20 MG-8.19 MG TAB: 20-8.19 | 28 days supply | Qty: 60 | Fill #0

## 2019-04-11 ENCOUNTER — Other Ambulatory Visit: Payer: Self-pay

## 2019-04-11 NOTE — Progress Notes (Signed)
[facility]  [address] [place], [place] [address] Phone: [phone]  Fax: [phone]   Telemedicine Office Visit:  04/12/2019  Referring physician: Gae Bon, NP  I connected with Latoya Jones on 04/12/2019 at 12:12 PM by videoconferencing and verified that I was speaking with the correct person using 2 identifiers.  The patient was at her group home.  I discussed the limitations, risk, security and privacy concerns of performing an evaluation and management service by videoconferencing and the availability of in person appointments.  I also discussed with the patient that there may be a patient responsible charge related to this service.  The patient expressed understanding and agreed to proceed.   Chief Complaint: Latoya Jones is a 73 y.o. female stage IV colon cancer who is seen forassessmenton day 1 of cycle #2Lonsurf.  HPI: The patient was last seen in the oncology clinic on 03/28/2019. At that time, she was day 15 of cycle #1 Lonsurf.  She was doing well.  She had some issues with constipation.  She denied any nausea or vomiting. WBC was 4100 (ANC 300). Platelet count 127,000. She was off treatment for 2 weeks.  CBC on 04/04/2019 revealed a hematocrit of 28.4, hemoglobin 9.2, MCV 94, platelets 141,000, WBC 2700 with an ANC of 800.  She was contacted regarding neutropenic precautions.  During the interim, she has been feeling depressed and anxious because of her youngest daughter's health issues and social issues. She has mental health issues of her own that are impacting Latoya Jones.   She had one episode of diarrhea a week and a half ago. She has a moderate appetite, but has to take small bites and drink a lot of water. She denies any mouth sores. She has a runny nose.   She weighed 165lbs today, down 2 pounds from 167lbs at her last clinic visit.   She has a cardiology appointment today.    Past Medical History:  Diagnosis Date   . Colon cancer ([place])   . Hypertension     Past Surgical History:  Procedure Laterality Date  . COLOSTOMY REVISION N/A 12/06/2017   Procedure: COLON RESECTION RIGHT;  Surgeon: Florene Glen, MD;  Location: [facility] ORS;  Service: General;  Laterality: N/A;  . DILATION AND CURETTAGE, DIAGNOSTIC / THERAPEUTIC    . PORTACATH PLACEMENT N/A 01/06/2018   Procedure: INSERTION PORT-A-CATH;  Surgeon: Vickie Epley, MD;  Location: [facility] ORS;  Service: General;  Laterality: N/A;    Family History  Problem Relation Age of Onset  . ALS Mother   . ALS Father     Social History:  reports that she quit smoking about 11 years ago. She has never used smokeless tobacco. She reports that she does not drink alcohol or use drugs. She lives in a family care home; generally accompanied by [name]. She has her own room at the care home.The patient is accompanied byHilda Browntoday.  Participants in the patient's visit and their role in the encounter included the patient, [name], and Waymon Budge, RN today. The intake visit was provided by Waymon Budge, RN.  Allergies: No Known Allergies  Current Medications: Current Outpatient Medications  Medication Sig Dispense Refill  . acetaminophen (TYLENOL) 500 MG tablet Take 1,000 mg by mouth every 8 (eight) hours as needed for mild pain.    Marland Kitchen alendronate (FOSAMAX) 70 MG tablet Take 70 mg by mouth once a week.    . ARIPiprazole (ABILIFY) 15 MG tablet Take 7.5  mg by mouth daily.    Marland Kitchen atorvastatin (LIPITOR) 20 MG tablet Take 1 tablet by mouth daily.    . calcium carbonate (TUMS - DOSED IN MG ELEMENTAL CALCIUM) 500 MG chewable tablet Chew 1 tablet by mouth 2 (two) times daily as needed for indigestion or heartburn.    . Calcium Carbonate-Vitamin D3 (CALCIUM 600-D) 600-400 MG-UNIT TABS Take 1 tablet by mouth daily.    Marland Kitchen LONSURF 20-8.19 MG tablet TAKE 3 TABLETS (60 MG OF TRIFLURIDINE TOTAL) BY MOUTH 2 TIMES DAILY AFTER A MEAL. TAKE ON DAYS 1-5,  8-12. REPEAT EVERY 28DAYS. 60 tablet 0  . memantine (NAMENDA XR) 14 MG CP24 24 hr capsule Take 14 mg by mouth at bedtime.     . metoprolol succinate (TOPROL-XL) 50 MG 24 hr tablet Take 25 mg by mouth every morning. Take with or immediately following a meal.     . Multiple Vitamins-Minerals (SENTRY SENIOR PO) Take 1 tablet by mouth daily.    . ondansetron (ZOFRAN) 8 MG tablet Take 1 tablet (8 mg total) by mouth 2 (two) times daily as needed for nausea or vomiting. 20 tablet 0  . perphenazine (TRILAFON) 4 MG tablet Take 4 mg by mouth 2 (two) times daily.    . prochlorperazine (COMPAZINE) 10 MG tablet Take 1 tablet (10 mg total) by mouth every 6 (six) hours as needed for nausea or vomiting. 30 tablet 0  . amLODipine (NORVASC) 10 MG tablet Take 10 mg by mouth daily.     Marland Kitchen lidocaine-prilocaine (EMLA) cream Apply 1 application topically as needed. 1 hour prior to  each chemotherapy treatment. Place small amount over port site and place saran wrap over the cream to protect clothing (Patient not taking: Reported on 03/13/2019) 30 g 0  . loratadine (CLARITIN) 10 MG tablet Take 1 tablet (10 mg total) by mouth daily as needed for allergies. Take 1 tablet the day before pump removal, 1 tablet the day of pump removal and 1 tablet the day after pump removal. Take as directed with each cycle of  chemotherapy treatment (Patient not taking: Reported on 03/28/2019) 30 tablet 1   No current facility-administered medications for this visit.    Facility-Administered Medications Ordered in Other Visits  Medication Dose Route Frequency Provider Last Rate Last Dose  . heparin lock flush 100 unit/mL  500 Units Intravenous Once Sindy Guadeloupe, MD      . heparin lock flush 100 unit/mL  500 Units Intravenous Once Sindy Guadeloupe, MD      . sodium chloride flush (NS) 0.9 % injection 10 mL  10 mL Intravenous PRN Sindy Guadeloupe, MD   10 mL at 02/06/18 0845  . sodium chloride flush (NS) 0.9 % injection 10 mL  10 mL Intravenous PRN  Sindy Guadeloupe, MD   10 mL at 03/27/18 0909    Review of Systems  Constitutional: Positive for weight loss (2lbs, 165lbs today). Negative for chills, fever and malaise/fatigue.       Doing well.  HENT: Negative.  Negative for congestion, ear discharge, ear pain, nosebleeds, sinus pain, sore throat and tinnitus.        Rhinorrhea.   Eyes: Negative.  Negative for blurred vision, double vision and photophobia.  Respiratory: Negative.  Negative for cough, hemoptysis, sputum production and shortness of breath.   Cardiovascular: Negative for chest pain, palpitations, orthopnea, leg swelling and PND.       H/o asymptomatic bradycardia.   Gastrointestinal: Negative for abdominal pain, blood in  stool, constipation, diarrhea (transient, 1 1/2 weeks ago), melena, nausea and vomiting.       Decreased appetite.   Genitourinary: Negative.  Negative for dysuria, frequency, hematuria and urgency.  Musculoskeletal: Negative.  Negative for back pain, joint pain, myalgias and neck pain.  Skin: Negative for itching and rash.       Minor skin breakdown to sacrum; using barrier cream.  Neurological: Negative.  Negative for dizziness, tingling, sensory change, focal weakness, weakness and headaches.  Endo/Heme/Allergies: Negative.  Does not bruise/bleed easily.  Psychiatric/Behavioral: Positive for depression. Negative for memory loss. The patient is nervous/anxious. The patient does not have insomnia.        Schizophrenia.  All other systems reviewed and are negative.   Performance status (ECOG): 1-2  Physical Exam  Constitutional: She is oriented to person, place, and time. She appears well-developed and well-nourished. No distress.  HENT:  Wearing a cap.  Short thin graying hair.   Eyes: Conjunctivae and EOM are normal.  Glasses.  Blue eyes.  Neurological: She is alert and oriented to person, place, and time.  Skin: She is not diaphoretic.  Psychiatric: She has a normal mood and affect. Her behavior is  normal.  Nursing note reviewed.   Appointment on 04/12/2019  Component Date Value Ref Range Status  . Magnesium 04/12/2019 2.0  1.7 - 2.4 mg/dL Final   Performed at [facility], [address], [place], [place] [address]  . Sodium 04/12/2019 134* 135 - 145 mmol/L Final  . Potassium 04/12/2019 3.9  3.5 - 5.1 mmol/L Final  . Chloride 04/12/2019 102  98 - 111 mmol/L Final  . CO2 04/12/2019 23  22 - 32 mmol/L Final  . Glucose, Bld 04/12/2019 174* 70 - 99 mg/dL Final  . BUN 04/12/2019 10  8 - 23 mg/dL Final  . Creatinine, Ser 04/12/2019 0.64  0.44 - 1.00 mg/dL Final  . Calcium 04/12/2019 9.1  8.9 - 10.3 mg/dL Final  . Total Protein 04/12/2019 7.0  6.5 - 8.1 g/dL Final  . Albumin 04/12/2019 3.1* 3.5 - 5.0 g/dL Final  . AST 04/12/2019 24  15 - 41 U/L Final  . ALT 04/12/2019 23  0 - 44 U/L Final  . Alkaline Phosphatase 04/12/2019 98  38 - 126 U/L Final  . Total Bilirubin 04/12/2019 0.4  0.3 - 1.2 mg/dL Final  . GFR calc non Af Amer 04/12/2019 >60  >60 mL/min Final  . GFR calc Af Amer 04/12/2019 >60  >60 mL/min Final  . Anion gap 04/12/2019 9  5 - 15 Final   Performed at [facility], [address] [place], [place] [address]  . WBC 04/12/2019 6.5  4.0 - 10.5 K/uL Final  . RBC 04/12/2019 3.21* 3.87 - 5.11 MIL/uL Final  . Hemoglobin 04/12/2019 9.6* 12.0 - 15.0 g/dL Final  . HCT 04/12/2019 30.4* 36.0 - 46.0 % Final  . MCV 04/12/2019 94.7  80.0 - 100.0 fL Final  . MCH 04/12/2019 29.9  26.0 - 34.0 pg Final  . MCHC 04/12/2019 31.6  30.0 - 36.0 g/dL Final  . RDW 04/12/2019 17.0* 11.5 - 15.5 % Final  . Platelets 04/12/2019 226  150 - 400 K/uL Final  . nRBC 04/12/2019 0.0  0.0 - 0.2 % Final  . Neutrophils Relative % 04/12/2019 70  % Final  . Neutro Abs 04/12/2019 4.6  1.7 - 7.7 K/uL Final  . Lymphocytes Relative 04/12/2019 21  % Final  . Lymphs Abs 04/12/2019 1.3  0.7 - 4.0 K/uL Final  . Monocytes Relative 04/12/2019 8  % Final  . Monocytes Absolute 04/12/2019 0.5   0.1 - 1.0 K/uL Final  . Eosinophils Relative 04/12/2019 0  % Final  . Eosinophils Absolute 04/12/2019 0.0  0.0 - 0.5 K/uL Final  . Basophils Relative 04/12/2019 0  % Final  . Basophils Absolute 04/12/2019 0.0  0.0 - 0.1 K/uL Final  . Immature Granulocytes 04/12/2019 1  % Final  . Abs Immature Granulocytes 04/12/2019 0.05  0.00 - 0.07 K/uL Final   Performed at [facility], [address] [place], [place] [address]    Assessment:  Latoya Jones is a 73 y.o. female with stage IV colon cancerwith peritoneal metastasis and possible liver metastasis. She is s/p exploratory laparotomy with right hemicolectomy and terminal ileum resection and separate small bowel resection on 12/06/2017. 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 Radial margin was positive for invasive carcinoma. There were 2 colon adenomas (2.5 and 1.8 cm) of the ascending colon. Metastatic adenocarcinoma was seen in 4 of 13 lymph nodes. There were 2 tumor deposits. There was a segment of small intestine with abscess which also showed adenocarcinoma invading the distal appendix with perforation. One mesenteric lymph node was negative for malignancy. Retroperitoneal tumor removal was positive for mucinous adenocarcinoma. Proximal and distal margins were negative. Pathologic stagewas [id number]. MSI stable. KRASmutation was positive  She received 5 cycles of FOLFOX(01/10/2018 - 03/27/2018). She began Neulasta with cycle #2. Oxaliplatin was decreased from 85 mg/m2 to 65 mg/m2 with cycle #4. She received 6 cycles of5FU + LV with Avastin(05/01/2018 - 07/17/2018).  She received 10 cycles of FOLIFIRI + Avastin(07/31/2018 - 02/13/2019). She receives chemotherapy every 3 weeks secondary to tolerance. Irinotecan was decreased from 150 mg/m2 to 125 mg/m2 with cycle #2. 5FU bolus was discontinued with cycle #6.  Chest, abdomen, and pelvic CTon  03/02/2019 revealed progressive disease within the abdomen and pelvis. There was increase in soft tissue thickening along the surgical site at the ileocolic junction (2.9 x 1.9 cm to 4.6 x 2.5 cm). There was new and progressive peritoneal metastasis (2.1 x 2.4 cm). There was no bowel obstruction or other acute complication. There was enlarging soft tissue metastasis on the right pelvic oblique musculature (1.1 cm to 1.7 cm).  She is s/p cycle #1 Lonsurf (began 03/14/2019).  CEAhas been followed: 4.8 on 12/04/2017, 3.1 on 01/10/2018, 4.2 on 03/06/2018, 3.8 on 05/01/2018, 9.2 on 07/31/2018, 5.9 on 09/18/2018, and 5.0 on 10/30/2018.  She has macrocytic RBC indices. She has a history of alow B12 level. B12 was 266 on 01/30/2018 and 2448 on 01/22/2019. Folatewas 32 on 01/30/2018 and 45 on 01/22/2019. TSHwas 0.913 on 12/03/2017 and 1.627 on 01/22/2019.  She has paranoid schizophrenia with delusions. She lives in a group home.  Symptomatically, she is doing well.  She had transient diarrhea 1 1/2 weeks ago.  Weight is down 2 pounds.  Plan: 1.   Labs today:  CBC with diff, CMP, Mg, CEA. 2.Stage IV colon cancer She is s/p FOLFOX then 5FU + LV + Avastin. She is s/p FOLFIRI + Avastin She is s/p cycle #1 Lonsurf.  She tolerated cycle #1 well.  Discuss plan for cycle #2 Lonsurf  She will return to a Monday- Friday schedule.  Cycle #2 to begin on Monday, 04/16/2019 [facility]).  Lonsurf day 1-5 and day 8-12 then 2 weeks off. Discuss symptom management.  She has antiemetics and pain  medications at home to use on a prn bases.  Interventions are adequate.     3.Constipation No current issues. 4.Stage I sacral decubitus Grade 1 decubitus ulcer. Continue to monitor. 5.RTC ([facility]) on 04/30/2019 for MD assessment and labs (CBC with diff, CMP), 6.   RTC on 06/18 for MD assessment, labs (CBC with diff, CMP, CEA), and plan for cycle #3 Lonsurf.  I  discussed the assessment and treatment plan with the patient.  The patient was provided an opportunity to ask questions and all were answered.  The patient agreed with the plan and demonstrated an understanding of the instructions.  The patient was advised to call back or seek an in person evaluation if the symptoms worsen or if the condition fails to improve as anticipated.  I provided 16 minutes (12:12 PM - 12:28 PM) of face-to-face video visit time during this this encounter and > 50% was spent counseling as documented under my assessment and plan.  I provided these services from the [facility] office.   Nolon Stalls, MD, PhD  04/12/2019, 12:12 PM  I, Molly Dorshimer, am acting as Education administrator for [employer]. Mike Gip, MD, PhD.  I,  C. Mike Gip, MD, have reviewed the above documentation for accuracy and completeness, and I agree with the above.

## 2019-04-12 ENCOUNTER — Inpatient Hospital Stay (HOSPITAL_BASED_OUTPATIENT_CLINIC_OR_DEPARTMENT_OTHER): Payer: Medicare Other | Admitting: Hematology and Oncology

## 2019-04-12 ENCOUNTER — Encounter: Payer: Self-pay | Admitting: Hematology and Oncology

## 2019-04-12 ENCOUNTER — Inpatient Hospital Stay: Payer: Medicare Other

## 2019-04-12 ENCOUNTER — Other Ambulatory Visit: Payer: Medicare Other

## 2019-04-12 DIAGNOSIS — C786 Secondary malignant neoplasm of retroperitoneum and peritoneum: Secondary | ICD-10-CM

## 2019-04-12 DIAGNOSIS — Z9221 Personal history of antineoplastic chemotherapy: Secondary | ICD-10-CM

## 2019-04-12 DIAGNOSIS — C787 Secondary malignant neoplasm of liver and intrahepatic bile duct: Secondary | ICD-10-CM

## 2019-04-12 DIAGNOSIS — C189 Malignant neoplasm of colon, unspecified: Secondary | ICD-10-CM

## 2019-04-12 LAB — COMPREHENSIVE METABOLIC PANEL
ALT: 23 U/L (ref 0–44)
AST: 24 U/L (ref 15–41)
Albumin: 3.1 g/dL — ABNORMAL LOW (ref 3.5–5.0)
Alkaline Phosphatase: 98 U/L (ref 38–126)
Anion gap: 9 (ref 5–15)
BUN: 10 mg/dL (ref 8–23)
CO2: 23 mmol/L (ref 22–32)
Calcium: 9.1 mg/dL (ref 8.9–10.3)
Chloride: 102 mmol/L (ref 98–111)
Creatinine, Ser: 0.64 mg/dL (ref 0.44–1.00)
GFR calc Af Amer: >60 mL/min (ref >60)
GFR calc non Af Amer: >60 mL/min (ref >60)
Glucose, Bld: 174 mg/dL — ABNORMAL HIGH (ref 70–99)
Potassium: 3.9 mmol/L (ref 3.5–5.1)
Sodium: 134 mmol/L — ABNORMAL LOW (ref 135–145)
Total Bilirubin: 0.4 mg/dL (ref 0.3–1.2)
Total Protein: 7 g/dL (ref 6.5–8.1)

## 2019-04-12 LAB — CBC WITH DIFFERENTIAL/PLATELET
Abs Immature Granulocytes: 0.05 10*3/uL (ref 0.00–0.07)
Basophils Absolute: 0 10*3/uL (ref 0.0–0.1)
Basophils Relative: 0 %
Eosinophils Absolute: 0 10*3/uL (ref 0.0–0.5)
Eosinophils Relative: 0 %
HCT: 30.4 % — ABNORMAL LOW (ref 36.0–46.0)
Hemoglobin: 9.6 g/dL — ABNORMAL LOW (ref 12.0–15.0)
Immature Granulocytes: 1 %
Lymphocytes Relative: 21 %
Lymphs Abs: 1.3 10*3/uL (ref 0.7–4.0)
MCH: 29.9 pg (ref 26.0–34.0)
MCHC: 31.6 g/dL (ref 30.0–36.0)
MCV: 94.7 fL (ref 80.0–100.0)
Monocytes Absolute: 0.5 10*3/uL (ref 0.1–1.0)
Monocytes Relative: 8 %
Neutro Abs: 4.6 10*3/uL (ref 1.7–7.7)
Neutrophils Relative %: 70 %
Platelets: 226 10*3/uL (ref 150–400)
RBC: 3.21 MIL/uL — ABNORMAL LOW (ref 3.87–5.11)
RDW: 17 % — ABNORMAL HIGH (ref 11.5–15.5)
WBC: 6.5 10*3/uL (ref 4.0–10.5)
nRBC: 0 % (ref 0.0–0.2)

## 2019-04-12 LAB — MAGNESIUM: Magnesium: 2 mg/dL (ref 1.7–2.4)

## 2019-04-12 NOTE — Progress Notes (Signed)
Confirmed Name, DOB, and Address. Assessment completed with assistance from [place], care giver. Denies any concerns.

## 2019-04-13 ENCOUNTER — Ambulatory Visit: Payer: Medicare Other | Admitting: Hematology and Oncology

## 2019-04-13 ENCOUNTER — Other Ambulatory Visit: Payer: Medicare Other

## 2019-04-14 LAB — CEA: CEA: 8.1 ng/mL — ABNORMAL HIGH (ref 0.0–4.7)

## 2019-04-27 ENCOUNTER — Other Ambulatory Visit: Payer: Self-pay

## 2019-04-29 NOTE — Progress Notes (Signed)
[facility]  [address] [place], [place] [address] Phone: [phone]  Fax: [phone]   Telemedicine Office Visit:  04/30/2019  Referring physician: Gae Bon, NP  I connected with Latoya Jones on 04/30/2019 at 1:21 PM by videoconferencing and verified that I was speaking with the correct person using 2 identifiers.  The patient was at her group home.  I discussed the limitations, risk, security and privacy concerns of performing an evaluation and management service by videoconferencing and the availability of in person appointments.  I also discussed with the patient that there may be a patient responsible charge related to this service.  The patient expressed understanding and agreed to proceed.   Chief Complaint: Latoya Jones is a 72 y.o. female with stage IV colon cancer who is seen forassessmenton day 15 of cycle#2Lonsurf.  HPI: The patient was last seen in the oncology clinic on 04/12/2019. At that time, she was doing well.  She had transient diarrhea 1 1/2 weeks prior. Weight was down 2 pounds. CEA was 8.1.   She began cycle #2 Lonusrf on 04/16/2019.   During the interim, she reports she "feels sicker."  She describes some nausea, vomiting, and diarrhea with her last cycle in addition to stomach/abdominal pain. Latoya Jones reports she has been putting too much food in her mouth at one time and throwing up because it is more than she could swallow.   She has had not other instances except when eating too much at a time.  She has had loss of taste.  She denies any blood in her stool or dark stools.   She denies any decrease in energy level, cough, or shortness of breath. She denies any fevers, new medications, or herbal products. The area of raw skin on her coccyx has healed and is dark and rough now.   Latoya Jones noted she has not seen a psychiatrist since the pandemic began and she has been having several schizophrenic episodes lately.      Past Medical History:  Diagnosis Date  . Colon cancer ([place])   . Hypertension     Past Surgical History:  Procedure Laterality Date  . COLOSTOMY REVISION N/A 12/06/2017   Procedure: COLON RESECTION RIGHT;  Surgeon: Florene Glen, MD;  Location: [facility] ORS;  Service: General;  Laterality: N/A;  . DILATION AND CURETTAGE, DIAGNOSTIC / THERAPEUTIC    . PORTACATH PLACEMENT N/A 01/06/2018   Procedure: INSERTION PORT-A-CATH;  Surgeon: Vickie Epley, MD;  Location: [facility] ORS;  Service: General;  Laterality: N/A;    Family History  Problem Relation Age of Onset  . ALS Mother   . ALS Father     Social History:  reports that she quit smoking about 11 years ago. She has never used smokeless tobacco. She reports that she does not drink alcohol or use drugs. She lives in a family care home; generally accompanied by Latoya Jones.  She is at the group home today.The patient is accompanied byHilda Browntoday.  Participants in the patient's visit and their role in the encounter included the patient, Latoya Jones, and Vito Berger, CMA, today.  The intake visit was provided by Vito Berger, CMA.  Allergies: No Known Allergies  Current Medications: Current Outpatient Medications  Medication Sig Dispense Refill  . alendronate (FOSAMAX) 70 MG tablet Take 70 mg by mouth once a week.    Marland Kitchen amLODipine (NORVASC) 10 MG tablet Take 10 mg by mouth daily.     . ARIPiprazole (ABILIFY) 15  MG tablet Take 7.5 mg by mouth daily.    Marland Kitchen atorvastatin (LIPITOR) 20 MG tablet Take 1 tablet by mouth daily.    . Calcium Carbonate-Vitamin D3 (CALCIUM 600-D) 600-400 MG-UNIT TABS Take 1 tablet by mouth daily.    Marland Kitchen LONSURF 20-8.19 MG tablet TAKE 3 TABLETS (60 MG OF TRIFLURIDINE TOTAL) BY MOUTH 2 TIMES DAILY AFTER A MEAL. TAKE ON DAYS 1-5, 8-12. REPEAT EVERY 28DAYS. 60 tablet 0  . loratadine (CLARITIN) 10 MG tablet Take 1 tablet (10 mg total) by mouth daily as needed for allergies. Take 1 tablet the day before pump  removal, 1 tablet the day of pump removal and 1 tablet the day after pump removal. Take as directed with each cycle of  chemotherapy treatment 30 tablet 1  . memantine (NAMENDA XR) 14 MG CP24 24 hr capsule Take 14 mg by mouth at bedtime.     . metoprolol succinate (TOPROL-XL) 50 MG 24 hr tablet Take 25 mg by mouth every morning. Take with or immediately following a meal.     . Multiple Vitamins-Minerals (SENTRY SENIOR PO) Take 1 tablet by mouth daily.    Marland Kitchen perphenazine (TRILAFON) 4 MG tablet Take 4 mg by mouth 2 (two) times daily.    Marland Kitchen acetaminophen (TYLENOL) 500 MG tablet Take 1,000 mg by mouth every 8 (eight) hours as needed for mild pain.    . calcium carbonate (TUMS - DOSED IN MG ELEMENTAL CALCIUM) 500 MG chewable tablet Chew 1 tablet by mouth 2 (two) times daily as needed for indigestion or heartburn.    . lidocaine-prilocaine (EMLA) cream Apply 1 application topically as needed. 1 hour prior to  each chemotherapy treatment. Place small amount over port site and place saran wrap over the cream to protect clothing (Patient not taking: Reported on 03/13/2019) 30 g 0  . ondansetron (ZOFRAN) 8 MG tablet Take 1 tablet (8 mg total) by mouth 2 (two) times daily as needed for nausea or vomiting. (Patient not taking: Reported on 04/30/2019) 20 tablet 0  . prochlorperazine (COMPAZINE) 10 MG tablet Take 1 tablet (10 mg total) by mouth every 6 (six) hours as needed for nausea or vomiting. (Patient not taking: Reported on 04/30/2019) 30 tablet 0   No current facility-administered medications for this visit.    Facility-Administered Medications Ordered in Other Visits  Medication Dose Route Frequency Provider Last Rate Last Dose  . heparin lock flush 100 unit/mL  500 Units Intravenous Once Sindy Guadeloupe, MD      . heparin lock flush 100 unit/mL  500 Units Intravenous Once Sindy Guadeloupe, MD      . sodium chloride flush (NS) 0.9 % injection 10 mL  10 mL Intravenous PRN Sindy Guadeloupe, MD   10 mL at 02/06/18 0845   . sodium chloride flush (NS) 0.9 % injection 10 mL  10 mL Intravenous PRN Sindy Guadeloupe, MD   10 mL at 03/27/18 0909    Review of Systems  Constitutional: Positive for weight loss (4lbs, 161.4 lbs today). Negative for chills, fever and malaise/fatigue.       She "feels sicker."  HENT: Negative.  Negative for congestion, ear discharge, ear pain, nosebleeds, sinus pain, sore throat and tinnitus.   Eyes: Negative.  Negative for blurred vision, double vision and photophobia.  Respiratory: Negative.  Negative for cough, hemoptysis, sputum production and shortness of breath.   Cardiovascular: Negative for chest pain, palpitations, orthopnea, leg swelling and PND.       H/o  asymptomatic bradycardia.   Gastrointestinal: Positive for abdominal pain (stomach), nausea and vomiting (after eating too much). Negative for blood in stool, constipation and melena. Diarrhea: loose stools, resolved.       Decreased appetite. Loss of taste.  Genitourinary: Negative.  Negative for dysuria, frequency, hematuria and urgency.  Musculoskeletal: Negative.  Negative for back pain, joint pain, myalgias and neck pain.  Skin: Negative for itching and rash.       Raw skin on her coccyx has healed and is dark, and rough.  Neurological: Negative.  Negative for dizziness, tingling, sensory change, focal weakness, weakness and headaches.  Endo/Heme/Allergies: Negative.  Does not bruise/bleed easily.  Psychiatric/Behavioral: Positive for depression. Negative for memory loss. The patient is nervous/anxious. The patient does not have insomnia.        Schizophrenia.  All other systems reviewed and are negative.   Performance status (ECOG): 1-2  Physical Exam  Constitutional: She is oriented to person, place, and time. She appears well-developed and well-nourished. No distress.  HENT:  Wearing a cap.  Short thin graying hair.   Eyes: Conjunctivae and EOM are normal.  Glasses.  Blue eyes.  Neurological: She is alert and  oriented to person, place, and time.  Skin: She is not diaphoretic.  Psychiatric: She has a normal mood and affect. Her behavior is normal.  Nursing note reviewed.   Infusion on 04/30/2019  Component Date Value Ref Range Status  . Sodium 04/30/2019 128* 135 - 145 mmol/L Final  . Potassium 04/30/2019 4.1  3.5 - 5.1 mmol/L Final  . Chloride 04/30/2019 98  98 - 111 mmol/L Final  . CO2 04/30/2019 23  22 - 32 mmol/L Final  . Glucose, Bld 04/30/2019 123* 70 - 99 mg/dL Final  . BUN 04/30/2019 15  8 - 23 mg/dL Final  . Creatinine, Ser 04/30/2019 0.56  0.44 - 1.00 mg/dL Final  . Calcium 04/30/2019 8.6* 8.9 - 10.3 mg/dL Final  . Total Protein 04/30/2019 6.8  6.5 - 8.1 g/dL Final  . Albumin 04/30/2019 2.7* 3.5 - 5.0 g/dL Final  . AST 04/30/2019 31  15 - 41 U/L Final  . ALT 04/30/2019 43  0 - 44 U/L Final  . Alkaline Phosphatase 04/30/2019 135* 38 - 126 U/L Final  . Total Bilirubin 04/30/2019 0.8  0.3 - 1.2 mg/dL Final  . GFR calc non Af Amer 04/30/2019 >60  >60 mL/min Final  . GFR calc Af Amer 04/30/2019 >60  >60 mL/min Final  . Anion gap 04/30/2019 7  5 - 15 Final   Performed at [facility], [address] [place], [place] [address]  . WBC 04/30/2019 2.1* 4.0 - 10.5 K/uL Final  . RBC 04/30/2019 2.60* 3.87 - 5.11 MIL/uL Final  . Hemoglobin 04/30/2019 7.6* 12.0 - 15.0 g/dL Final  . HCT 04/30/2019 23.7* 36.0 - 46.0 % Final  . MCV 04/30/2019 91.2  80.0 - 100.0 fL Final  . MCH 04/30/2019 29.2  26.0 - 34.0 pg Final  . MCHC 04/30/2019 32.1  30.0 - 36.0 g/dL Final  . RDW 04/30/2019 18.3* 11.5 - 15.5 % Final  . Platelets 04/30/2019 142* 150 - 400 K/uL Final  . nRBC 04/30/2019 0.0  0.0 - 0.2 % Final  . Neutrophils Relative % 04/30/2019 57  % Final  . Neutro Abs 04/30/2019 1.3* 1.7 - 7.7 K/uL Final  . Lymphocytes Relative 04/30/2019 33  % Final  . Lymphs Abs 04/30/2019 0.7  0.7 - 4.0 K/uL Final  . Monocytes  Relative 04/30/2019 6  % Final  . Monocytes Absolute 04/30/2019 0.1  0.1 -  1.0 K/uL Final  . Eosinophils Relative 04/30/2019 1  % Final  . Eosinophils Absolute 04/30/2019 0.0  0.0 - 0.5 K/uL Final  . Basophils Relative 04/30/2019 1  % Final  . Basophils Absolute 04/30/2019 0.0  0.0 - 0.1 K/uL Final  . Immature Granulocytes 04/30/2019 2  % Final  . Abs Immature Granulocytes 04/30/2019 0.05  0.00 - 0.07 K/uL Final   Performed at [facility], [address], [place], [place] [address]    Assessment:  Krisna Omar is a 73 y.o. female with stage IV colon cancerwith peritoneal metastasis and possible liver metastasis. She is s/p exploratory laparotomy with right hemicolectomy and terminal ileum resection and separate small bowel resection on 12/06/2017. 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 Radial margin was positive for invasive carcinoma. There were 2 colon adenomas (2.5 and 1.8 cm) of the ascending colon. Metastatic adenocarcinoma was seen in 4 of 13 lymph nodes. There were 2 tumor deposits. There was a segment of small intestine with abscess which also showed adenocarcinoma invading the distal appendix with perforation. One mesenteric lymph node was negative for malignancy. Retroperitoneal tumor removal was positive for mucinous adenocarcinoma. Proximal and distal margins were negative. Pathologic stagewas [id number]. MSI stable. KRASmutation was positive  She received 5 cycles of FOLFOX(01/10/2018 - 03/27/2018). She began Neulasta with cycle #2. Oxaliplatin was decreased from 85 mg/m2 to 65 mg/m2 with cycle #4. She received 6 cycles of5FU + LV with Avastin(05/01/2018 - 07/17/2018).  She received 10 cycles of FOLIFIRI + Avastin(07/31/2018 - 02/13/2019). She receives chemotherapy every 3 weeks secondary to tolerance. Irinotecan was decreased from 150 mg/m2 to 125 mg/m2 with cycle #2. 5FU bolus was discontinued with cycle #6.  Chest, abdomen, and pelvic CTon 03/02/2019  revealed progressive disease within the abdomen and pelvis. There was increase in soft tissue thickening along the surgical site at the ileocolic junction (2.9 x 1.9 cm to 4.6 x 2.5 cm). There was new and progressive peritoneal metastasis (2.1 x 2.4 cm). There was no bowel obstruction or other acute complication. There was enlarging soft tissue metastasis on the right pelvic oblique musculature (1.1 cm to 1.7 cm).  She is day 15 of cycle #2 Lonsurf (03/14/2019 - 04/16/2019).  CEAhas been followed: 4.8 on 12/04/2017, 3.1 on 01/10/2018, 4.2 on 03/06/2018, 3.8 on 05/01/2018, 9.2 on 07/31/2018, 5.9 on 09/18/2018, and 5.0 on 10/30/2018.  She has macrocytic RBC indices. She has a history of alow B12 level. B12 was 266 on 01/30/2018 and 2448 on 01/22/2019. Folatewas 32 on 01/30/2018 and 45 on 01/22/2019. TSHwas 0.913 on 12/03/2017 and 1.627 on 01/22/2019.  She has paranoid schizophrenia with delusions. She lives in a group home.  Symptomatically, she has felt "sicker".  She has had an episode of vomiting after eating too much.  Stools are loose at times.  She denies any melena or hematochezia.  Hemoglobin is 7.6.  WBC 2100 ([place] 1300).  Platelets 142,000.  Sodium is 128 and potassium 4.1.  Plan: 1.   Labs today:  CBC with diff, CMP. 2.Stage IV colon cancer She is s/p FOLFOX then 5FU + LV + Avastin. She is s/p FOLFIRI + Avastin She is day 15 s/p cycle #2 Lonsurf. Counts have dropped as compared to cycle #1 She does not appear to have any issues with diarrhea, nausea or vomiting. Discuss need for close monitoring of counts  given unexpected drop. Discuss likely need for transfusion of PRBCs. Discuss neutropenic precautions. 3.   Mild pancytopenia  Hemoglobin 7.6.  Platelets 142,000.  WBC 2100 ([place] 1300).  Etiology secondary to chemotherapy.  No new medications or herbal products.  Diet appears good.  Check ferritin, iron studies, retic.  Review neutropenic  precautions.  Discuss plan for reassessment in person before the weekend and possible RBC transfusion. 4.Stage I sacral decubitus Patient and caregiver note area healed. Evaluate in person at next appointment. 5.Hyponatremia  Patient predominantly drinking water.  Discuss fluids with electrolytes and added salt.  Continue to monitor. 6.   RTC on 05/03/2019 for MD assessment (in [employer]), labs (CBC with diff, CMP, hold tube). 7.   RTC on 05/07/2019 for MD assessment ([place]) and labs (CBC with diff, BMP).  I discussed the assessment and treatment plan with the patient.  The patient was provided an opportunity to ask questions and all were answered.  The patient agreed with the plan and demonstrated an understanding of the instructions.  The patient was advised to call back or seek an in person evaluation if the symptoms worsen or if the condition fails to improve as anticipated.  I provided 26 minutes (1:21 PM - 1:47 PM) of face-to-face video visit time during this this encounter and > 50% was spent counseling as documented under my assessment and plan.  I provided these services from the [facility] office.   Nolon Stalls, MD, PhD  04/30/2019, 1:21 PM  I, Molly Dorshimer, am acting as Education administrator for [employer]. Mike Gip, MD, PhD.  I, Melissa C. Mike Gip, MD, have reviewed the above documentation for accuracy and completeness, and I agree with the above.

## 2019-04-30 ENCOUNTER — Other Ambulatory Visit: Payer: Self-pay

## 2019-04-30 ENCOUNTER — Inpatient Hospital Stay: Payer: Medicare Other

## 2019-04-30 ENCOUNTER — Inpatient Hospital Stay: Payer: Medicare Other | Attending: Hematology and Oncology | Admitting: Hematology and Oncology

## 2019-04-30 ENCOUNTER — Other Ambulatory Visit: Payer: Medicare Other

## 2019-04-30 ENCOUNTER — Encounter: Payer: Self-pay | Admitting: Hematology and Oncology

## 2019-04-30 DIAGNOSIS — Z79899 Other long term (current) drug therapy: Secondary | ICD-10-CM | POA: Diagnosis not present

## 2019-04-30 DIAGNOSIS — C189 Malignant neoplasm of colon, unspecified: Secondary | ICD-10-CM

## 2019-04-30 DIAGNOSIS — C786 Secondary malignant neoplasm of retroperitoneum and peritoneum: Secondary | ICD-10-CM | POA: Diagnosis not present

## 2019-04-30 DIAGNOSIS — E871 Hypo-osmolality and hyponatremia: Secondary | ICD-10-CM | POA: Insufficient documentation

## 2019-04-30 DIAGNOSIS — Z9221 Personal history of antineoplastic chemotherapy: Secondary | ICD-10-CM | POA: Diagnosis not present

## 2019-04-30 DIAGNOSIS — D61818 Other pancytopenia: Secondary | ICD-10-CM | POA: Insufficient documentation

## 2019-04-30 DIAGNOSIS — T451X5A Adverse effect of antineoplastic and immunosuppressive drugs, initial encounter: Secondary | ICD-10-CM | POA: Diagnosis not present

## 2019-04-30 DIAGNOSIS — C7989 Secondary malignant neoplasm of other specified sites: Secondary | ICD-10-CM | POA: Diagnosis not present

## 2019-04-30 DIAGNOSIS — D6481 Anemia due to antineoplastic chemotherapy: Secondary | ICD-10-CM | POA: Insufficient documentation

## 2019-04-30 DIAGNOSIS — C787 Secondary malignant neoplasm of liver and intrahepatic bile duct: Secondary | ICD-10-CM

## 2019-04-30 DIAGNOSIS — D649 Anemia, unspecified: Secondary | ICD-10-CM

## 2019-04-30 DIAGNOSIS — C180 Malignant neoplasm of cecum: Secondary | ICD-10-CM | POA: Diagnosis present

## 2019-04-30 DIAGNOSIS — F200 Paranoid schizophrenia: Secondary | ICD-10-CM | POA: Insufficient documentation

## 2019-04-30 DIAGNOSIS — F209 Schizophrenia, unspecified: Secondary | ICD-10-CM

## 2019-04-30 LAB — CBC WITH DIFFERENTIAL/PLATELET
Abs Immature Granulocytes: 0.05 10*3/uL (ref 0.00–0.07)
Basophils Absolute: 0 10*3/uL (ref 0.0–0.1)
Basophils Relative: 1 %
Eosinophils Absolute: 0 10*3/uL (ref 0.0–0.5)
Eosinophils Relative: 1 %
HCT: 23.7 % — ABNORMAL LOW (ref 36.0–46.0)
Hemoglobin: 7.6 g/dL — ABNORMAL LOW (ref 12.0–15.0)
Immature Granulocytes: 2 %
Lymphocytes Relative: 33 %
Lymphs Abs: 0.7 10*3/uL (ref 0.7–4.0)
MCH: 29.2 pg (ref 26.0–34.0)
MCHC: 32.1 g/dL (ref 30.0–36.0)
MCV: 91.2 fL (ref 80.0–100.0)
Monocytes Absolute: 0.1 10*3/uL (ref 0.1–1.0)
Monocytes Relative: 6 %
Neutro Abs: 1.3 10*3/uL — ABNORMAL LOW (ref 1.7–7.7)
Neutrophils Relative %: 57 %
Platelets: 142 10*3/uL — ABNORMAL LOW (ref 150–400)
RBC: 2.6 MIL/uL — ABNORMAL LOW (ref 3.87–5.11)
RDW: 18.3 % — ABNORMAL HIGH (ref 11.5–15.5)
WBC: 2.1 10*3/uL — ABNORMAL LOW (ref 4.0–10.5)
nRBC: 0 % (ref 0.0–0.2)

## 2019-04-30 LAB — COMPREHENSIVE METABOLIC PANEL
ALT: 43 U/L (ref 0–44)
AST: 31 U/L (ref 15–41)
Albumin: 2.7 g/dL — ABNORMAL LOW (ref 3.5–5.0)
Alkaline Phosphatase: 135 U/L — ABNORMAL HIGH (ref 38–126)
Anion gap: 7 (ref 5–15)
BUN: 15 mg/dL (ref 8–23)
CO2: 23 mmol/L (ref 22–32)
Calcium: 8.6 mg/dL — ABNORMAL LOW (ref 8.9–10.3)
Chloride: 98 mmol/L (ref 98–111)
Creatinine, Ser: 0.56 mg/dL (ref 0.44–1.00)
GFR calc Af Amer: >60 mL/min (ref >60)
GFR calc non Af Amer: >60 mL/min (ref >60)
Glucose, Bld: 123 mg/dL — ABNORMAL HIGH (ref 70–99)
Potassium: 4.1 mmol/L (ref 3.5–5.1)
Sodium: 128 mmol/L — ABNORMAL LOW (ref 135–145)
Total Bilirubin: 0.8 mg/dL (ref 0.3–1.2)
Total Protein: 6.8 g/dL (ref 6.5–8.1)

## 2019-04-30 LAB — FERRITIN: Ferritin: 535 ng/mL — ABNORMAL HIGH (ref 11–307)

## 2019-04-30 LAB — IRON AND TIBC
Iron: 28 ug/dL (ref 28–170)
Saturation Ratios: 11 % (ref 10.4–31.8)
TIBC: 252 ug/dL (ref 250–450)
UIBC: 224 ug/dL

## 2019-04-30 LAB — RETICULOCYTES
Immature Retic Fract: 10.6 % (ref 2.3–15.9)
RBC.: 2.65 MIL/uL — ABNORMAL LOW (ref 3.87–5.11)
Retic Count, Absolute: 9.8 10*3/uL — ABNORMAL LOW (ref 19.0–186.0)
Retic Ct Pct: 0.4 % (ref 0.4–3.1)

## 2019-04-30 MED ORDER — SODIUM CHLORIDE 0.9% FLUSH
10.0000 mL | INTRAVENOUS | Status: DC | PRN
  Filled 2019-04-30: qty 10

## 2019-04-30 MED ORDER — HEPARIN SOD (PORK) LOCK FLUSH 100 UNIT/ML IV SOLN: 500.0000 [IU] | Freq: Once | INTRAVENOUS | Status: DC

## 2019-04-30 NOTE — Progress Notes (Signed)
Port accessed for labs.  Port flushed well, with ease, but no blood return noted.  Port heplocked and labs drawn peripherally.

## 2019-04-30 NOTE — Progress Notes (Signed)
new changes noted today. Ms Latoya Jones does report today  Ms Latoya Jones has been c/o increased stomach pain at least 2-3 days a week ( mostly in the evening) Ms Latoya Jones reports she has been given Ms Latoya Jones Tylenol in the evening before bed due to the stomach pain and has had decrease in her appetite.  The patient Name and DOB has been verified by phone today

## 2019-05-02 ENCOUNTER — Other Ambulatory Visit: Payer: Medicare Other

## 2019-05-02 ENCOUNTER — Ambulatory Visit: Payer: Medicare Other | Admitting: Hematology and Oncology

## 2019-05-02 NOTE — Progress Notes (Signed)
[facility]  [address] [place], [place] [address] Phone: [phone]  Fax: [phone]   Clinic Day:  05/03/2019  Referring physician: Gae Bon, NP  Chief Complaint: Latoya Jones is a 73 y.o. female with stage IV colon cancer who is seen forassessmenton day18of cycle#2Lonsurf.  HPI:  The patient was last seen in the medical oncology clinic on 04/30/2019 via telemedicine.  At that time, she "felt sicker."  She described an episode of vomiting after eating too much.  Stools were loose at times.  She denied any melena or hematochezia. The area of raw skin on her coccyx had healed.  Latoya Jones, her caregiver, noted she had a recent increase in schizophrenic episodes.  She had completed Lonsurf.   Labs included a WBC of 2100 ([place] 1300).  Hematocrit was 23.7, hemoglobin 7.6, and platelets 142,000.  Sodium was 128.  Potassium was 4.1.  We discussed neutropenic precautions.  Anemia work-up revealed a ferritin of 535 and iron saturation 11%.  Retic was 0.4%.  During the interim, the patient is feeling "weak." The patient is a poor historian. She denies any chest pain but notes a little shortness of breath with exertion.  She is still doing small chores around the house. She is resting more.  She denies any lightheadedness or dizziness.  She recalls peeing in the bed in the past few weeks.  Latoya Jones confirms one episode related to her "not wanting to get out of bed".  She had one episode of vomiting after putting too much food in her mouth. She denies any nausea or vomiting today.  For breakfast she ate eggs, sausage, and a fruit cup, and she has been drinking Ensure. She has been drinking more fluids, Gatorade, juice, and soda.   She has experienced occasional loose stools but denies any blood in stool, and melena. She reports her last bowel movement this morning was "grainy." The lesion on her coccyx had healed and is brown.  She continues to have mental  issues where she repetitively hollars "ok, ok, ok" or "stop it, stop it, stop it".  During the visit she reported feeling cold, and was visibly distressed. She noted she was "having a panic attack."   Past Medical History:  Diagnosis Date  . Colon cancer ([place])   . Hypertension     Past Surgical History:  Procedure Laterality Date  . COLOSTOMY REVISION N/A 12/06/2017   Procedure: COLON RESECTION RIGHT;  Surgeon: Latoya Glen, MD;  Location: [facility] ORS;  Service: General;  Laterality: N/A;  . DILATION AND CURETTAGE, DIAGNOSTIC / THERAPEUTIC    . PORTACATH PLACEMENT N/A 01/06/2018   Procedure: INSERTION PORT-A-CATH;  Surgeon: Latoya Epley, MD;  Location: [facility] ORS;  Service: General;  Laterality: N/A;    Family History  Problem Relation Age of Onset  . ALS Mother   . ALS Father     Social History:  reports that she quit smoking about 11 years ago. She has never used smokeless tobacco. She reports that she does not drink alcohol or use drugs. She lives in a family care home; generally accompanied by Latoya Jones. She is alonein clinic today.  I spoke with Latoya Jones on the phone (she was in the parking lot).  Allergies: No Known Allergies  Current Medications: Current Outpatient Medications  Medication Sig Dispense Refill  . acetaminophen (TYLENOL) 500 MG tablet Take 1,000 mg by mouth every 8 (eight) hours as needed for mild pain.    Marland Kitchen alendronate (  FOSAMAX) 70 MG tablet Take 70 mg by mouth once a week.    Marland Kitchen amLODipine (NORVASC) 10 MG tablet Take 10 mg by mouth daily.     . ARIPiprazole (ABILIFY) 15 MG tablet Take 7.5 mg by mouth daily.    Marland Kitchen atorvastatin (LIPITOR) 20 MG tablet Take 1 tablet by mouth daily.    . calcium carbonate (TUMS - DOSED IN MG ELEMENTAL CALCIUM) 500 MG chewable tablet Chew 1 tablet by mouth 2 (two) times daily as needed for indigestion or heartburn.    . Calcium Carbonate-Vitamin D3 (CALCIUM 600-D) 600-400 MG-UNIT TABS Take 1 tablet by mouth daily.    Marland Kitchen  loratadine (CLARITIN) 10 MG tablet Take 1 tablet (10 mg total) by mouth daily as needed for allergies. Take 1 tablet the day before pump removal, 1 tablet the day of pump removal and 1 tablet the day after pump removal. Take as directed with each cycle of  chemotherapy treatment 30 tablet 1  . memantine (NAMENDA XR) 14 MG CP24 24 hr capsule Take 14 mg by mouth at bedtime.     . metoprolol succinate (TOPROL-XL) 50 MG 24 hr tablet Take 25 mg by mouth every morning. Take with or immediately following a meal.     . Multiple Vitamins-Minerals (SENTRY SENIOR PO) Take 1 tablet by mouth daily.    Marland Kitchen perphenazine (TRILAFON) 4 MG tablet Take 4 mg by mouth 2 (two) times daily.    Marland Kitchen lidocaine-prilocaine (EMLA) cream Apply 1 application topically as needed. 1 hour prior to  each chemotherapy treatment. Place small amount over port site and place saran wrap over the cream to protect clothing (Patient not taking: Reported on 03/13/2019) 30 g 0  . LONSURF 20-8.19 MG tablet TAKE 3 TABLETS (60 MG OF TRIFLURIDINE TOTAL) BY MOUTH 2 TIMES DAILY AFTER A MEAL. TAKE ON DAYS 1-5, 8-12. REPEAT EVERY 28DAYS. 60 tablet 0  . ondansetron (ZOFRAN) 8 MG tablet Take 1 tablet (8 mg total) by mouth 2 (two) times daily as needed for nausea or vomiting. (Patient not taking: Reported on 04/30/2019) 20 tablet 0  . prochlorperazine (COMPAZINE) 10 MG tablet Take 1 tablet (10 mg total) by mouth every 6 (six) hours as needed for nausea or vomiting. (Patient not taking: Reported on 04/30/2019) 30 tablet 0   No current facility-administered medications for this visit.    Facility-Administered Medications Ordered in Other Visits  Medication Dose Route Frequency Provider Last Rate Last Dose  . heparin lock flush 100 unit/mL  500 Units Intravenous Once Latoya Guadeloupe, MD      . heparin lock flush 100 unit/mL  500 Units Intravenous Once Latoya Guadeloupe, MD      . sodium chloride flush (NS) 0.9 % injection 10 mL  10 mL Intravenous PRN Latoya Guadeloupe, MD    10 mL at 02/06/18 0845  . sodium chloride flush (NS) 0.9 % injection 10 mL  10 mL Intravenous PRN Latoya Guadeloupe, MD   10 mL at 03/27/18 0909    Review of Systems  Constitutional: Positive for malaise/fatigue. Negative for chills, diaphoresis, fever and weight loss.       She feels "weak."  HENT: Negative.  Negative for congestion, ear discharge, ear pain, nosebleeds, sinus pain, sore throat and tinnitus.   Eyes: Negative.  Negative for blurred vision, double vision, photophobia and pain.  Respiratory: Negative.  Negative for cough, hemoptysis, sputum production and shortness of breath.   Cardiovascular: Negative for chest pain, palpitations, orthopnea, leg  swelling and PND.       H/o asymptomatic bradycardia.   Gastrointestinal: Positive for abdominal pain (stomach; takes Tylenol once at night). Negative for blood in stool, constipation, heartburn, melena and nausea. Diarrhea: loose stools intermittently.       Decreased appetite. Loss of taste.  Genitourinary: Negative.  Negative for dysuria, frequency, hematuria and urgency.  Musculoskeletal: Negative.  Negative for back pain, joint pain, myalgias and neck pain.  Skin: Negative.  Negative for itching and rash.       Raw skin on her coccyx has healed.  Neurological: Positive for weakness (generalized). Negative for dizziness, tingling, sensory change, speech change, focal weakness and headaches.  Endo/Heme/Allergies: Negative.  Does not bruise/bleed easily.  Psychiatric/Behavioral: Positive for memory loss. Negative for depression. The patient is nervous/anxious. The patient does not have insomnia.        Schizophrenia.  All other systems reviewed and are negative.  Performance status (ECOG): 2  Physical Exam  Constitutional: She appears well-developed and well-nourished. No distress.  HENT:  Head: Normocephalic and atraumatic.  Mouth/Throat: Oropharynx is clear and moist. No oropharyngeal exudate.  Wearing a black cap and mask.   Short thin graying hair.  No mouth sores.  Eyes: Pupils are equal, round, and reactive to light. Conjunctivae and EOM are normal. No scleral icterus.  Glasses.  Blue eyes.  Neck: Normal range of motion. Neck supple. No JVD present.  Cardiovascular: Normal rate, regular rhythm and normal heart sounds. Exam reveals no gallop.  No murmur heard. Pulmonary/Chest: Effort normal and breath sounds normal. No respiratory distress. She has no wheezes. She has no rales.  Abdominal: Soft. Bowel sounds are normal. She exhibits no distension and no mass. There is no abdominal tenderness. There is no rebound and no guarding.  Musculoskeletal: Normal range of motion.        General: Tenderness (bilateral ankles) and edema (bilateral ankles) present.  Lymphadenopathy:    She has no cervical adenopathy.    She has no axillary adenopathy.       Right: No supraclavicular adenopathy present.       Left: No supraclavicular adenopathy present.  Neurological: She is alert. She is disoriented.  Skin: Skin is warm and dry. No rash noted. She is not diaphoretic. No erythema. There is pallor.  Coccyx area healed with hyperpigmentation.  Psychiatric: Her mood appears anxious. Cognition and memory are impaired. She exhibits abnormal recent memory and abnormal remote memory.  Brief panic attack during exam.  Nursing note and vitals reviewed.   Appointment on 05/03/2019  Component Date Value Ref Range Status  . WBC 05/03/2019 2.1* 4.0 - 10.5 K/uL Final  . RBC 05/03/2019 2.39* 3.87 - 5.11 MIL/uL Final  . Hemoglobin 05/03/2019 6.9* 12.0 - 15.0 g/dL Final  . HCT 05/03/2019 21.5* 36.0 - 46.0 % Final  . MCV 05/03/2019 90.0  80.0 - 100.0 fL Final  . MCH 05/03/2019 28.9  26.0 - 34.0 pg Final  . MCHC 05/03/2019 32.1  30.0 - 36.0 g/dL Final  . RDW 05/03/2019 19.1* 11.5 - 15.5 % Final  . Platelets 05/03/2019 129* 150 - 400 K/uL Final  . nRBC 05/03/2019 0.0  0.0 - 0.2 % Final   Performed at [facility],  [address] [place], [place] [address]  . Neutrophils Relative % 05/03/2019 PENDING  % Incomplete  . Neutro Abs 05/03/2019 PENDING  1.7 - 7.7 K/uL Incomplete  . Band Neutrophils 05/03/2019 PENDING  % Incomplete  . Lymphocytes Relative 05/03/2019  PENDING  % Incomplete  . Lymphs Abs 05/03/2019 PENDING  0.7 - 4.0 K/uL Incomplete  . Monocytes Relative 05/03/2019 PENDING  % Incomplete  . Monocytes Absolute 05/03/2019 PENDING  0.1 - 1.0 K/uL Incomplete  . Eosinophils Relative 05/03/2019 PENDING  % Incomplete  . Eosinophils Absolute 05/03/2019 PENDING  0.0 - 0.5 K/uL Incomplete  . Basophils Relative 05/03/2019 PENDING  % Incomplete  . Basophils Absolute 05/03/2019 PENDING  0.0 - 0.1 K/uL Incomplete  . WBC Morphology 05/03/2019 PENDING   Incomplete  . RBC Morphology 05/03/2019 PENDING   Incomplete  . Smear Review 05/03/2019 PENDING   Incomplete  . Other 05/03/2019 PENDING  % Incomplete  . nRBC 05/03/2019 PENDING  0 /100 WBC Incomplete  . Metamyelocytes Relative 05/03/2019 PENDING  % Incomplete  . Myelocytes 05/03/2019 PENDING  % Incomplete  . Promyelocytes Relative 05/03/2019 PENDING  % Incomplete  . Blasts 05/03/2019 PENDING  % Incomplete  . Sodium 05/03/2019 129* 135 - 145 mmol/L Final  . Potassium 05/03/2019 3.7  3.5 - 5.1 mmol/L Final  . Chloride 05/03/2019 96* 98 - 111 mmol/L Final  . CO2 05/03/2019 23  22 - 32 mmol/L Final  . Glucose, Bld 05/03/2019 130* 70 - 99 mg/dL Final  . BUN 05/03/2019 18  8 - 23 mg/dL Final  . Creatinine, Ser 05/03/2019 0.55  0.44 - 1.00 mg/dL Final  . Calcium 05/03/2019 8.9  8.9 - 10.3 mg/dL Final  . Total Protein 05/03/2019 6.7  6.5 - 8.1 g/dL Final  . Albumin 05/03/2019 2.7* 3.5 - 5.0 g/dL Final  . AST 05/03/2019 32  15 - 41 U/L Final  . ALT 05/03/2019 43  0 - 44 U/L Final  . Alkaline Phosphatase 05/03/2019 133* 38 - 126 U/L Final  . Total Bilirubin 05/03/2019 0.8  0.3 - 1.2 mg/dL Final  . GFR calc non Af Amer 05/03/2019 >60  >60 mL/min Final  . GFR calc  Af Amer 05/03/2019 >60  >60 mL/min Final  . Anion gap 05/03/2019 10  5 - 15 Final   Performed at [facility], [address], [place], [place] [address]    Assessment:  Latoya Jones is a 73 y.o. female with stage IV colon cancerwith peritoneal metastasis and possible liver metastasis. She is s/p exploratory laparotomy with right hemicolectomy and terminal ileum resection and separate small bowel resection on 12/06/2017. 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 Radial margin was positive for invasive carcinoma. There were 2 colon adenomas (2.5 and 1.8 cm) of the ascending colon. Metastatic adenocarcinoma was seen in 4 of 13 lymph nodes. There were 2 tumor deposits. There was a segment of small intestine with abscess which also showed adenocarcinoma invading the distal appendix with perforation. One mesenteric lymph node was negative for malignancy. Retroperitoneal tumor removal was positive for mucinous adenocarcinoma. Proximal and distal margins were negative. Pathologic stagewas [id number]. MSI stable. KRASmutation was positive  She received 5 cycles of FOLFOX(01/10/2018 - 03/27/2018). She began Neulasta with cycle #2. Oxaliplatin was decreased from 85 mg/m2 to 65 mg/m2 with cycle #4. She received 6 cycles of5FU + LV with Avastin(05/01/2018 - 07/17/2018).  She received 10 cycles of FOLIFIRI + Avastin(07/31/2018 - 02/13/2019). She receives chemotherapy every 3 weeks secondary to tolerance. Irinotecan was decreased from 150 mg/m2 to 125 mg/m2 with cycle #2. 5FU bolus was discontinued with cycle #6.  Chest, abdomen, and pelvic CTon 03/02/2019 revealed progressive disease within the abdomen and pelvis. There was increase in  soft tissue thickening along the surgical site at the ileocolic junction (2.9 x 1.9 cm to 4.6 x 2.5 cm). There was new and progressive peritoneal metastasis (2.1 x 2.4 cm). There  was no bowel obstruction or other acute complication. There was enlarging soft tissue metastasis on the right pelvic oblique musculature (1.1 cm to 1.7 cm).  She isday 18 of cycle #2 Lonsurf (03/14/2019 - 04/16/2019).  CEAhas been followed: 4.8 on 12/04/2017, 3.1 on 01/10/2018, 4.2 on 03/06/2018, 3.8 on 05/01/2018, 9.2 on 07/31/2018, 5.9 on 09/18/2018, 5.0 on 10/30/2018, 5.0 on 02/13/2019, 7.5 on 03/13/2019, and 8.1 on 04/12/2019.  She has macrocytic RBC indices. She has a history of alow B12 level. B12 was 266 on 01/30/2018 and 2448 on 01/22/2019. Folatewas 32 on 01/30/2018 and 45 on 01/22/2019. TSHwas 0.913 on 12/03/2017 and 1.627 on 01/22/2019.  She has paranoid schizophrenia with delusions. She lives in a group home.  Symptomatically, she is fatigued.  She denies any shortness of breath, chest pain or dizziness.  She is eating well without nausea or vomiting.  Stools have been formed to slightly loose.    Plan: 1.   Labs today:  CBC with diff, CMP, hold tube. 2.Stage IV colon cancer Sheis s/pFOLFOX then 5FU + LV + Avastin. She is s/pFOLFIRI + Avastin She isday 20 s/pcycle #2 Lonsurf. Clinically, she is doing well except for chemotherapy induced anemia and leukopenia. Confirm that patient only took Lonsurf on day 1-5 and day 8-12. Review at least 2 weeks off prior to next cycle. Discuss symptom management.  She has antiemetics and pain medications at home to use on a prn bases.  Interventions are adequate.    3.Pancytopenia  Hemoglobin 6.9.  WBC 2100 ([employer] 800).  Platelets 129,000.  Discuss transfusion of PRBCs as patient symptomatic with hemoglobin < 7.   Patient in agreement.   Discuss transfusion today or tomorrow.   Patient would require a short stay for transfusion of PRBCs today secondary to time of day.   Patient agreeable to return to [facility] tomorrow for PRBC transfusion.  Discussed with [name].  Reinforce strict  neutropenic precautions and contacting clinic if any concerns.  Ms Latoya Jones confirms patient has staff available 24/7 if any concerns. 4.   Hyponatremia  Sodium 129, improved slightly.  Reinforce fluids with electrolyes and added sodium. 5.   Transfuse 1 unit PRBCs tomorrow in [place] 6.   RTC on 05/07/2019 for MD assessment in [place] and labs (CBC with diff, BMP, hold tube).  I discussed the assessment and treatment plan with the patient.  The patient was provided an opportunity to ask questions and all were answered.  The patient agreed with the plan and demonstrated an understanding of the instructions.  The patient was advised to call back if the symptoms worsen or if the condition fails to improve as anticipated.  I provided 30 minutes of face-to-face time during this this encounter and > 50% was spent counseling as documented under my assessment and plan.    Lequita Asal, MD, PhD    05/03/2019, 1:47 PM  I, Molly Dorshimer, am acting as Education administrator for [employer]. Mike Gip, MD, PhD.  I, Melissa C. Mike Gip, MD, have reviewed the above documentation for accuracy and completeness, and I agree with the above.

## 2019-05-03 ENCOUNTER — Other Ambulatory Visit: Payer: Self-pay

## 2019-05-03 ENCOUNTER — Inpatient Hospital Stay (HOSPITAL_BASED_OUTPATIENT_CLINIC_OR_DEPARTMENT_OTHER): Payer: Medicare Other | Admitting: Hematology and Oncology

## 2019-05-03 ENCOUNTER — Inpatient Hospital Stay: Payer: Medicare Other

## 2019-05-03 ENCOUNTER — Encounter: Payer: Self-pay | Admitting: Hematology and Oncology

## 2019-05-03 VITALS — BP 104/66 | HR 93 | Temp 98.2°F | Resp 16 | Wt 160.9 lb

## 2019-05-03 DIAGNOSIS — D61818 Other pancytopenia: Secondary | ICD-10-CM

## 2019-05-03 DIAGNOSIS — C7989 Secondary malignant neoplasm of other specified sites: Secondary | ICD-10-CM | POA: Diagnosis not present

## 2019-05-03 DIAGNOSIS — T451X5A Adverse effect of antineoplastic and immunosuppressive drugs, initial encounter: Secondary | ICD-10-CM

## 2019-05-03 DIAGNOSIS — D649 Anemia, unspecified: Secondary | ICD-10-CM | POA: Insufficient documentation

## 2019-05-03 DIAGNOSIS — C189 Malignant neoplasm of colon, unspecified: Secondary | ICD-10-CM

## 2019-05-03 DIAGNOSIS — D6481 Anemia due to antineoplastic chemotherapy: Secondary | ICD-10-CM

## 2019-05-03 DIAGNOSIS — C787 Secondary malignant neoplasm of liver and intrahepatic bile duct: Secondary | ICD-10-CM

## 2019-05-03 DIAGNOSIS — C786 Secondary malignant neoplasm of retroperitoneum and peritoneum: Secondary | ICD-10-CM | POA: Diagnosis not present

## 2019-05-03 DIAGNOSIS — E871 Hypo-osmolality and hyponatremia: Secondary | ICD-10-CM

## 2019-05-03 DIAGNOSIS — D701 Agranulocytosis secondary to cancer chemotherapy: Secondary | ICD-10-CM

## 2019-05-03 DIAGNOSIS — C180 Malignant neoplasm of cecum: Secondary | ICD-10-CM

## 2019-05-03 DIAGNOSIS — Z79899 Other long term (current) drug therapy: Secondary | ICD-10-CM

## 2019-05-03 DIAGNOSIS — Z9221 Personal history of antineoplastic chemotherapy: Secondary | ICD-10-CM

## 2019-05-03 DIAGNOSIS — F200 Paranoid schizophrenia: Secondary | ICD-10-CM

## 2019-05-03 LAB — SAMPLE TO BLOOD BANK

## 2019-05-03 LAB — CBC WITH DIFFERENTIAL/PLATELET
Abs Immature Granulocytes: 0.01 10*3/uL (ref 0.00–0.07)
Basophils Absolute: 0 10*3/uL (ref 0.0–0.1)
Basophils Relative: 1 %
Eosinophils Absolute: 0 10*3/uL (ref 0.0–0.5)
Eosinophils Relative: 1 %
HCT: 21.5 % — ABNORMAL LOW (ref 36.0–46.0)
Hemoglobin: 6.9 g/dL — ABNORMAL LOW (ref 12.0–15.0)
Immature Granulocytes: 1 %
Lymphocytes Relative: 47 %
Lymphs Abs: 1 10*3/uL (ref 0.7–4.0)
MCH: 28.9 pg (ref 26.0–34.0)
MCHC: 32.1 g/dL (ref 30.0–36.0)
MCV: 90 fL (ref 80.0–100.0)
Monocytes Absolute: 0.3 10*3/uL (ref 0.1–1.0)
Monocytes Relative: 13 %
Neutro Abs: 0.8 10*3/uL — ABNORMAL LOW (ref 1.7–7.7)
Neutrophils Relative %: 37 %
Platelets: 129 10*3/uL — ABNORMAL LOW (ref 150–400)
RBC: 2.39 MIL/uL — ABNORMAL LOW (ref 3.87–5.11)
RDW: 19.1 % — ABNORMAL HIGH (ref 11.5–15.5)
WBC: 2.1 10*3/uL — ABNORMAL LOW (ref 4.0–10.5)
nRBC: 0 % (ref 0.0–0.2)

## 2019-05-03 LAB — COMPREHENSIVE METABOLIC PANEL
ALT: 43 U/L (ref 0–44)
AST: 32 U/L (ref 15–41)
Albumin: 2.7 g/dL — ABNORMAL LOW (ref 3.5–5.0)
Alkaline Phosphatase: 133 U/L — ABNORMAL HIGH (ref 38–126)
Anion gap: 10 (ref 5–15)
BUN: 18 mg/dL (ref 8–23)
CO2: 23 mmol/L (ref 22–32)
Calcium: 8.9 mg/dL (ref 8.9–10.3)
Chloride: 96 mmol/L — ABNORMAL LOW (ref 98–111)
Creatinine, Ser: 0.55 mg/dL (ref 0.44–1.00)
GFR calc Af Amer: >60 mL/min (ref >60)
GFR calc non Af Amer: >60 mL/min (ref >60)
Glucose, Bld: 130 mg/dL — ABNORMAL HIGH (ref 70–99)
Potassium: 3.7 mmol/L (ref 3.5–5.1)
Sodium: 129 mmol/L — ABNORMAL LOW (ref 135–145)
Total Bilirubin: 0.8 mg/dL (ref 0.3–1.2)
Total Protein: 6.7 g/dL (ref 6.5–8.1)

## 2019-05-03 NOTE — Progress Notes (Signed)
Pt here for follow up. Denies any concerns at this time.  

## 2019-05-04 ENCOUNTER — Other Ambulatory Visit: Payer: Self-pay

## 2019-05-04 ENCOUNTER — Inpatient Hospital Stay: Payer: Medicare Other

## 2019-05-04 DIAGNOSIS — E871 Hypo-osmolality and hyponatremia: Secondary | ICD-10-CM | POA: Insufficient documentation

## 2019-05-04 DIAGNOSIS — D649 Anemia, unspecified: Secondary | ICD-10-CM

## 2019-05-04 DIAGNOSIS — C180 Malignant neoplasm of cecum: Secondary | ICD-10-CM | POA: Diagnosis not present

## 2019-05-04 MED ORDER — DIPHENHYDRAMINE HCL 25 MG PO CAPS
25.0000 mg | ORAL_CAPSULE | Freq: Once | ORAL | Status: AC
  Administered 2019-05-04: 11:00:00 25 mg via ORAL
  Filled 2019-05-04: qty 1

## 2019-05-04 MED ORDER — SODIUM CHLORIDE 0.9% FLUSH
10.0000 mL | INTRAVENOUS | Status: AC | PRN
  Administered 2019-05-04: 11:00:00 10 mL
  Filled 2019-05-04: qty 10

## 2019-05-04 MED ORDER — ACETAMINOPHEN 325 MG PO TABS
650.0000 mg | ORAL_TABLET | Freq: Once | ORAL | Status: AC
  Administered 2019-05-04: 11:00:00 650 mg via ORAL
  Filled 2019-05-04: qty 2

## 2019-05-04 MED ORDER — HEPARIN SOD (PORK) LOCK FLUSH 100 UNIT/ML IV SOLN
500.0000 [IU] | Freq: Every day | INTRAVENOUS | Status: AC | PRN
  Administered 2019-05-04: 13:00:00 500 [IU]
  Filled 2019-05-04: qty 5

## 2019-05-04 MED ORDER — SODIUM CHLORIDE 0.9% IV SOLUTION
250.0000 mL | Freq: Once | INTRAVENOUS | Status: AC
  Administered 2019-05-04: 11:00:00 250 mL via INTRAVENOUS
  Filled 2019-05-04: qty 250

## 2019-05-05 LAB — BPAM RBC
Blood Product Expiration Date: 202007082359
ISSUE DATE / TIME: 202006121119
Unit Type and Rh: 5100

## 2019-05-05 LAB — TYPE AND SCREEN
ABO/RH(D): AB POS
Antibody Screen: NEGATIVE
Unit division: 0

## 2019-05-05 LAB — PREPARE RBC (CROSSMATCH)

## 2019-05-06 DIAGNOSIS — D72819 Decreased white blood cell count, unspecified: Secondary | ICD-10-CM | POA: Insufficient documentation

## 2019-05-06 NOTE — Progress Notes (Signed)
[facility]  [address] [place], [place] [address] Phone: [phone]  Fax: [phone]   Clinic Day:  05/06/2019  Referring physician: Gae Bon, NP  Chief Complaint: Latoya Jones is a 73 y.o. female withstage IV colon cancer who is seen forassessmenton day22of cycle#2Lonsurf.  HPI: The patient was last seen in the medical oncology clinic on 05/03/2019. At that time, she was fatigued.  She denied any shortness of breath, chest pain or dizziness.  She was eating well without nausea or vomiting.  Stools were formed to slightly loose. Hemoglobin was 6.9, hematocrit 21.5.   She received one unit of PRBCs on 05/04/2019.   During the interim, she has felt "pretty good".  She describes having a good week.  Ms [name] notes one meltdown on Saturday.  She is active.  She is eating well.  She is having regular bowel movements without diarrhea.  She states that she is hungry today and "had 2 snacks in [place]" (when she was there).   Past Medical History:  Diagnosis Date  . Colon cancer ([place])   . Hypertension     Past Surgical History:  Procedure Laterality Date  . COLOSTOMY REVISION N/A 12/06/2017   Procedure: COLON RESECTION RIGHT;  Surgeon: Florene Glen, MD;  Location: [facility] ORS;  Service: General;  Laterality: N/A;  . DILATION AND CURETTAGE, DIAGNOSTIC / THERAPEUTIC    . PORTACATH PLACEMENT N/A 01/06/2018   Procedure: INSERTION PORT-A-CATH;  Surgeon: Vickie Epley, MD;  Location: [facility] ORS;  Service: General;  Laterality: N/A;    Family History  Problem Relation Age of Onset  . ALS Mother   . ALS Father     Social History:  reports that she quit smoking about 11 years ago. She has never used smokeless tobacco. She reports that she does not drink alcohol or use drugs. She lives in a family care home; generally accompanied by [name]. She is alonein clinic today.  I spoke with [name] on the phone.  Allergies: No  Known Allergies  Current Medications: Current Outpatient Medications  Medication Sig Dispense Refill  . acetaminophen (TYLENOL) 500 MG tablet Take 1,000 mg by mouth every 8 (eight) hours as needed for mild pain.    Marland Kitchen alendronate (FOSAMAX) 70 MG tablet Take 70 mg by mouth once a week.    Marland Kitchen amLODipine (NORVASC) 10 MG tablet Take 10 mg by mouth daily.     . ARIPiprazole (ABILIFY) 15 MG tablet Take 7.5 mg by mouth daily.    Marland Kitchen atorvastatin (LIPITOR) 20 MG tablet Take 1 tablet by mouth daily.    . calcium carbonate (TUMS - DOSED IN MG ELEMENTAL CALCIUM) 500 MG chewable tablet Chew 1 tablet by mouth 2 (two) times daily as needed for indigestion or heartburn.    . Calcium Carbonate-Vitamin D3 (CALCIUM 600-D) 600-400 MG-UNIT TABS Take 1 tablet by mouth daily.    Marland Kitchen lidocaine-prilocaine (EMLA) cream Apply 1 application topically as needed. 1 hour prior to  each chemotherapy treatment. Place small amount over port site and place saran wrap over the cream to protect clothing (Patient not taking: Reported on 03/13/2019) 30 g 0  . LONSURF 20-8.19 MG tablet TAKE 3 TABLETS (60 MG OF TRIFLURIDINE TOTAL) BY MOUTH 2 TIMES DAILY AFTER A MEAL. TAKE ON DAYS 1-5, 8-12. REPEAT EVERY 28DAYS. 60 tablet 0  . loratadine (CLARITIN) 10 MG tablet Take 1 tablet (10 mg total) by mouth daily as needed for allergies. Take 1 tablet the  day before pump removal, 1 tablet the day of pump removal and 1 tablet the day after pump removal. Take as directed with each cycle of  chemotherapy treatment 30 tablet 1  . memantine (NAMENDA XR) 14 MG CP24 24 hr capsule Take 14 mg by mouth at bedtime.     . metoprolol succinate (TOPROL-XL) 50 MG 24 hr tablet Take 25 mg by mouth every morning. Take with or immediately following a meal.     . Multiple Vitamins-Minerals (SENTRY SENIOR PO) Take 1 tablet by mouth daily.    . ondansetron (ZOFRAN) 8 MG tablet Take 1 tablet (8 mg total) by mouth 2 (two) times daily as needed for nausea or vomiting. (Patient  not taking: Reported on 04/30/2019) 20 tablet 0  . perphenazine (TRILAFON) 4 MG tablet Take 4 mg by mouth 2 (two) times daily.    . prochlorperazine (COMPAZINE) 10 MG tablet Take 1 tablet (10 mg total) by mouth every 6 (six) hours as needed for nausea or vomiting. (Patient not taking: Reported on 04/30/2019) 30 tablet 0   No current facility-administered medications for this visit.    Facility-Administered Medications Ordered in Other Visits  Medication Dose Route Frequency Provider Last Rate Last Dose  . heparin lock flush 100 unit/mL  500 Units Intravenous Once Sindy Guadeloupe, MD      . heparin lock flush 100 unit/mL  500 Units Intravenous Once Sindy Guadeloupe, MD      . sodium chloride flush (NS) 0.9 % injection 10 mL  10 mL Intravenous PRN Sindy Guadeloupe, MD   10 mL at 02/06/18 0845  . sodium chloride flush (NS) 0.9 % injection 10 mL  10 mL Intravenous PRN Sindy Guadeloupe, MD   10 mL at 03/27/18 9233    Review of Systems  Constitutional: Negative.  Negative for chills, diaphoresis, fever, malaise/fatigue and weight loss (up 3 pounds).       Feels "good".  HENT: Negative.  Negative for congestion, ear discharge, ear pain, nosebleeds, sinus pain, sore throat and tinnitus.   Eyes: Negative.  Negative for blurred vision, double vision, photophobia and pain.  Respiratory: Negative.  Negative for cough, hemoptysis, sputum production and shortness of breath.   Cardiovascular: Negative for chest pain, palpitations, orthopnea, claudication, leg swelling (chronic- 4 years) and PND.       H/o asymptomatic bradycardia.   Gastrointestinal: Negative.  Negative for abdominal pain, blood in stool, constipation, diarrhea, heartburn, melena, nausea and vomiting.       Eating well.  Genitourinary: Negative.  Negative for dysuria, frequency, hematuria and urgency.  Musculoskeletal: Negative.  Negative for back pain, joint pain, myalgias and neck pain.  Skin: Negative.  Negative for itching and rash.   Neurological: Negative for dizziness, tingling, sensory change, speech change, focal weakness, weakness and headaches.  Endo/Heme/Allergies: Negative.  Does not bruise/bleed easily.  Psychiatric/Behavioral: Positive for memory loss. Negative for depression. The patient is nervous/anxious. The patient does not have insomnia.        Schizophrenia.  All other systems reviewed and are negative.  Performance status (ECOG): 2  Physical Exam  Constitutional: She appears well-developed and well-nourished. No distress.  HENT:  Head: Normocephalic and atraumatic.  Mouth/Throat: Oropharynx is clear and moist. No oropharyngeal exudate.  Wearing a cap and mask.  Short thin graying hair.  Eyes: Pupils are equal, round, and reactive to light. Conjunctivae and EOM are normal. No scleral icterus.  Glasses.  Blue eyes.  Neck: Normal range of motion.  Neck supple. No JVD present.  Cardiovascular: Normal rate, regular rhythm and normal heart sounds. Exam reveals no gallop.  No murmur heard. Pulmonary/Chest: Effort normal and breath sounds normal. No respiratory distress. She has no wheezes. She has no rales.  Abdominal: Soft. Bowel sounds are normal. She exhibits no distension and no mass. There is no abdominal tenderness. There is no rebound and no guarding.  Musculoskeletal: Normal range of motion.        General: Edema (chronic lower extremity) present. No tenderness.  Lymphadenopathy:    She has no cervical adenopathy.    She has no axillary adenopathy.       Right: No supraclavicular adenopathy present.       Left: No supraclavicular adenopathy present.  Neurological: She is alert.  Skin: Skin is warm and dry. No rash noted. She is not diaphoretic. No erythema. No pallor.  Psychiatric: She has a normal mood and affect. Her behavior is normal. Judgment normal. Cognition and memory are impaired. She exhibits abnormal recent memory and abnormal remote memory.  Nursing note and vitals reviewed.   No  visits with results within 3 Day(s) from this visit.  Latest known visit with results is:  Office Visit on 05/03/2019  Component Date Value Ref Range Status  . Order Confirmation 05/03/2019    Final                   Value:ORDER PROCESSED BY BLOOD BANK Performed at [facility], [address] [place], [place] [address]     Assessment:  Schelly Chuba is a 73 y.o. female with stage IV colon cancerwith peritoneal metastasis and possible liver metastasis. She is s/p exploratory laparotomy with right hemicolectomy and terminal ileum resection and separate small bowel resection on 12/06/2017. 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 Radial margin was positive for invasive carcinoma. There were 2 colon adenomas (2.5 and 1.8 cm) of the ascending colon. Metastatic adenocarcinoma was seen in 4 of 13 lymph nodes. There were 2 tumor deposits. There was a segment of small intestine with abscess which also showed adenocarcinoma invading the distal appendix with perforation. One mesenteric lymph node was negative for malignancy. Retroperitoneal tumor removal was positive for mucinous adenocarcinoma. Proximal and distal margins were negative. Pathologic stagewas [id number]. MSI stable. KRASmutation was positive  She received 5 cycles of FOLFOX(01/10/2018 - 03/27/2018). She began Neulasta with cycle #2. Oxaliplatin was decreased from 85 mg/m2 to 65 mg/m2 with cycle #4. She received 6 cycles of5FU + LV with Avastin(05/01/2018 - 07/17/2018).  She received 10 cycles of FOLIFIRI + Avastin(07/31/2018 - 02/13/2019). She receives chemotherapy every 3 weeks secondary to tolerance. Irinotecan was decreased from 150 mg/m2 to 125 mg/m2 with cycle #2. 5FU bolus was discontinued with cycle #6.  Chest, abdomen, and pelvic CTon 03/02/2019 revealed progressive disease within the abdomen and pelvis. There was increase in soft  tissue thickening along the surgical site at the ileocolic junction (2.9 x 1.9 cm to 4.6 x 2.5 cm). There was new and progressive peritoneal metastasis (2.1 x 2.4 cm). There was no bowel obstruction or other acute complication. There was enlarging soft tissue metastasis on the right pelvic oblique musculature (1.1 cm to 1.7 cm).  She isday 24 ofcycle #2Lonsurf (03/14/2019- 04/16/2019).  Cycle #2 was notable for pancytopenia.  She required 1 unit of PRBCs on 05/04/2019.  CEAhas been followed: 4.8 on 12/04/2017, 3.1 on 01/10/2018, 4.2 on 03/06/2018, 3.8 on 05/01/2018, 9.2 on 07/31/2018,  5.9 on 09/18/2018, 5.0 on 10/30/2018, 5.0 on 02/13/2019, 7.5 on 03/13/2019, and 8.1 on 04/12/2019.  She has macrocytic RBC indices. She has a history of alow B12 level. B12 was 266 on 01/30/2018 and 2448 on 01/22/2019. Folatewas 32 on 01/30/2018 and 45 on 01/22/2019. TSHwas 0.913 on 12/03/2017 and 1.627 on 01/22/2019.  She has paranoid schizophrenia with delusions. She lives in a group home.  Symptomatically, she is feeling back to baseline.  Exam is unremarkable.  Hemoglobin is 8.5.  WBC 3100 (ANC 1500).  Plan: 1.   Labs today:  CBC with diff, BMP, hold tube. 2.Stage IV colon cancer Sheis s/pFOLFOX then 5FU + LV + Avastin. She is s/pFOLFIRI + Avastin She isday 24 s/pcycle #2 Lonsurf. Clinically, she is doing well.  Counts are recovering. Discuss possible dose reduction (contact Nuala Alpha, PharmD). Anticipate reimaging after recovery from cycle #3. Discuss plan to initiate next cycle on 05/21/2019 (Monday) following assessment on 05/17/2019 (Thursday). 3.Pancytopenia             Hemoglobin 8.5.  WBC 3100 (ANC 1500).  Platelets 195,000.             Counts are recovering.  Discuss extra week off therapy to allow continued improvement in counts. 4.   Hyponatremia             Sodium 130.  Slow improvement continues.             Patient using added salt and  choosing salty foods (chips). 5.   RTC on 05/17/2019 for MD assessment, labs (CBC with diff, CMP, Mg, CEA), and initiation of chemotherapy on 05/21/2019.  I discussed the assessment and treatment plan with the patient.  The patient was provided an opportunity to ask questions and all were answered.  The patient agreed with the plan and demonstrated an understanding of the instructions.  The patient was advised to call back if the symptoms worsen or if the condition fails to improve as anticipated.   Lequita Asal, MD, PhD    05/06/2019, 4:45 PM

## 2019-05-07 ENCOUNTER — Ambulatory Visit: Payer: Medicare Other | Admitting: Hematology and Oncology

## 2019-05-07 ENCOUNTER — Other Ambulatory Visit: Payer: Self-pay

## 2019-05-07 ENCOUNTER — Encounter: Payer: Self-pay | Admitting: Hematology and Oncology

## 2019-05-07 ENCOUNTER — Inpatient Hospital Stay: Payer: Medicare Other

## 2019-05-07 ENCOUNTER — Other Ambulatory Visit: Payer: Medicare Other

## 2019-05-07 ENCOUNTER — Inpatient Hospital Stay (HOSPITAL_BASED_OUTPATIENT_CLINIC_OR_DEPARTMENT_OTHER): Payer: Medicare Other | Admitting: Hematology and Oncology

## 2019-05-07 VITALS — BP 122/74 | HR 82 | Temp 97.9°F | Resp 18 | Ht 65.0 in | Wt 163.1 lb

## 2019-05-07 DIAGNOSIS — T451X5A Adverse effect of antineoplastic and immunosuppressive drugs, initial encounter: Secondary | ICD-10-CM

## 2019-05-07 DIAGNOSIS — F200 Paranoid schizophrenia: Secondary | ICD-10-CM

## 2019-05-07 DIAGNOSIS — C787 Secondary malignant neoplasm of liver and intrahepatic bile duct: Secondary | ICD-10-CM

## 2019-05-07 DIAGNOSIS — Z79899 Other long term (current) drug therapy: Secondary | ICD-10-CM

## 2019-05-07 DIAGNOSIS — D701 Agranulocytosis secondary to cancer chemotherapy: Secondary | ICD-10-CM

## 2019-05-07 DIAGNOSIS — C7989 Secondary malignant neoplasm of other specified sites: Secondary | ICD-10-CM | POA: Diagnosis not present

## 2019-05-07 DIAGNOSIS — D61818 Other pancytopenia: Secondary | ICD-10-CM

## 2019-05-07 DIAGNOSIS — D6481 Anemia due to antineoplastic chemotherapy: Secondary | ICD-10-CM

## 2019-05-07 DIAGNOSIS — D649 Anemia, unspecified: Secondary | ICD-10-CM

## 2019-05-07 DIAGNOSIS — C189 Malignant neoplasm of colon, unspecified: Secondary | ICD-10-CM

## 2019-05-07 DIAGNOSIS — E871 Hypo-osmolality and hyponatremia: Secondary | ICD-10-CM

## 2019-05-07 DIAGNOSIS — C180 Malignant neoplasm of cecum: Secondary | ICD-10-CM

## 2019-05-07 DIAGNOSIS — C786 Secondary malignant neoplasm of retroperitoneum and peritoneum: Secondary | ICD-10-CM

## 2019-05-07 DIAGNOSIS — T451X5S Adverse effect of antineoplastic and immunosuppressive drugs, sequela: Secondary | ICD-10-CM

## 2019-05-07 DIAGNOSIS — Z9221 Personal history of antineoplastic chemotherapy: Secondary | ICD-10-CM

## 2019-05-07 LAB — CBC WITH DIFFERENTIAL/PLATELET
Abs Immature Granulocytes: 0.02 10*3/uL (ref 0.00–0.07)
Basophils Absolute: 0 10*3/uL (ref 0.0–0.1)
Basophils Relative: 0 %
Eosinophils Absolute: 0 10*3/uL (ref 0.0–0.5)
Eosinophils Relative: 1 %
HCT: 26.8 % — ABNORMAL LOW (ref 36.0–46.0)
Hemoglobin: 8.5 g/dL — ABNORMAL LOW (ref 12.0–15.0)
Immature Granulocytes: 1 %
Lymphocytes Relative: 32 %
Lymphs Abs: 1 10*3/uL (ref 0.7–4.0)
MCH: 29.2 pg (ref 26.0–34.0)
MCHC: 31.7 g/dL (ref 30.0–36.0)
MCV: 92.1 fL (ref 80.0–100.0)
Monocytes Absolute: 0.6 10*3/uL (ref 0.1–1.0)
Monocytes Relative: 19 %
Neutro Abs: 1.5 10*3/uL — ABNORMAL LOW (ref 1.7–7.7)
Neutrophils Relative %: 47 %
Platelets: 195 10*3/uL (ref 150–400)
RBC: 2.91 MIL/uL — ABNORMAL LOW (ref 3.87–5.11)
RDW: 18.9 % — ABNORMAL HIGH (ref 11.5–15.5)
WBC: 3.1 10*3/uL — ABNORMAL LOW (ref 4.0–10.5)
nRBC: 0 % (ref 0.0–0.2)

## 2019-05-07 LAB — BASIC METABOLIC PANEL
Anion gap: 8 (ref 5–15)
BUN: 13 mg/dL (ref 8–23)
CO2: 23 mmol/L (ref 22–32)
Calcium: 8.6 mg/dL — ABNORMAL LOW (ref 8.9–10.3)
Chloride: 99 mmol/L (ref 98–111)
Creatinine, Ser: 0.47 mg/dL (ref 0.44–1.00)
GFR calc Af Amer: >60 mL/min (ref >60)
GFR calc non Af Amer: >60 mL/min (ref >60)
Glucose, Bld: 118 mg/dL — ABNORMAL HIGH (ref 70–99)
Potassium: 3.7 mmol/L (ref 3.5–5.1)
Sodium: 130 mmol/L — ABNORMAL LOW (ref 135–145)

## 2019-05-07 LAB — SAMPLE TO BLOOD BANK

## 2019-05-07 NOTE — Progress Notes (Signed)
No new changes noted today. Medication has been verified by Ms [name]

## 2019-05-08 ENCOUNTER — Ambulatory Visit: Payer: Medicare Other

## 2019-05-08 LAB — CEA: CEA: 9 ng/mL — ABNORMAL HIGH (ref 0.0–4.7)

## 2019-05-10 ENCOUNTER — Ambulatory Visit: Payer: Medicare Other | Admitting: Hematology and Oncology

## 2019-05-10 ENCOUNTER — Other Ambulatory Visit: Payer: Medicare Other

## 2019-05-14 ENCOUNTER — Other Ambulatory Visit: Payer: Self-pay

## 2019-05-14 DIAGNOSIS — C189 Malignant neoplasm of colon, unspecified: Secondary | ICD-10-CM

## 2019-05-16 ENCOUNTER — Other Ambulatory Visit: Payer: Self-pay

## 2019-05-16 ENCOUNTER — Other Ambulatory Visit: Payer: Self-pay | Admitting: Hematology and Oncology

## 2019-05-16 DIAGNOSIS — C189 Malignant neoplasm of colon, unspecified: Secondary | ICD-10-CM

## 2019-05-16 NOTE — Progress Notes (Signed)
[facility]  [address], [address] [place], [place] [address] Phone: [phone]  Fax: [phone]   Clinic Day:  05/17/2019  Referring physician: Gae Bon, NP  Chief Complaint: Latoya Jones is a 73 y.o. female withstage IV colon cancer who is seen forassessmenton day32of cycle#2Lonsurf.  HPI: The patient was last seen in the medical oncology clinic on 05/07/2019. At that time, her energy level was stable. Coccyx wound had healed.  She was feeling close to baseline.  She had some abdominal pain at night, for which she took Tylenol.  Bowel movements were normal.  WBC 3,100 (ANC 1500) , hemoglobin 8.5, hematocrit 26.8, platelets 195,000. Calcium 8.6. CEA 9.0.   During the interim, she reports she is "pretty good all in all."  [name], her primary historian, reports a few schizophrenic episodes, as she has been unable to see her psychiatrist.  She denies any drop in energy level. She denies any chest pain, shortness of breath, or cough. She denies any nausea, vomiting, or diarrhea. She has daily bowel movements, occasionally loose stools. She has occasional abdominal pain at night, and is taking Tylenol. The wound on her coccyx is healing well.    Past Medical History:  Diagnosis Date  . Colon cancer ([place])   . Hypertension     Past Surgical History:  Procedure Laterality Date  . COLOSTOMY REVISION N/A 12/06/2017   Procedure: COLON RESECTION RIGHT;  Surgeon: Florene Glen, MD;  Location: [facility] ORS;  Service: General;  Laterality: N/A;  . DILATION AND CURETTAGE, DIAGNOSTIC / THERAPEUTIC    . PORTACATH PLACEMENT N/A 01/06/2018   Procedure: INSERTION PORT-A-CATH;  Surgeon: Vickie Epley, MD;  Location: [facility] ORS;  Service: General;  Laterality: N/A;    Family History  Problem Relation Age of Onset  . ALS Mother   . ALS Father     Social History:  reports that she quit smoking about 11 years ago. She has never used smokeless tobacco. She  reports that she does not drink alcohol or use drugs. She lives in a family care home; generally accompanied by [name]. She is alonein clinictoday.I spoke with [name] on the phone (she was in the parking lot) [phone].  Allergies: No Known Allergies  Current Medications: Current Outpatient Medications  Medication Sig Dispense Refill  . acetaminophen (TYLENOL) 500 MG tablet Take 1,000 mg by mouth every 8 (eight) hours as needed for mild pain.    Marland Kitchen alendronate (FOSAMAX) 70 MG tablet Take 70 mg by mouth once a week.    Marland Kitchen amLODipine (NORVASC) 10 MG tablet Take 10 mg by mouth daily.     . ARIPiprazole (ABILIFY) 15 MG tablet Take 7.5 mg by mouth daily.    Marland Kitchen atorvastatin (LIPITOR) 20 MG tablet Take 1 tablet by mouth daily.    . calcium carbonate (TUMS - DOSED IN MG ELEMENTAL CALCIUM) 500 MG chewable tablet Chew 1 tablet by mouth 2 (two) times daily as needed for indigestion or heartburn.    . Calcium Carbonate-Vitamin D3 (CALCIUM 600-D) 600-400 MG-UNIT TABS Take 1 tablet by mouth daily.    Marland Kitchen lidocaine-prilocaine (EMLA) cream Apply 1 application topically as needed. 1 hour prior to  each chemotherapy treatment. Place small amount over port site and place saran wrap over the cream to protect clothing (Patient not taking: Reported on 03/13/2019) 30 g 0  . loratadine (CLARITIN) 10 MG tablet Take 1 tablet (10 mg total) by mouth daily as needed for allergies. Take 1 tablet  the day before pump removal, 1 tablet the day of pump removal and 1 tablet the day after pump removal. Take as directed with each cycle of  chemotherapy treatment 30 tablet 1  . memantine (NAMENDA XR) 14 MG CP24 24 hr capsule Take 14 mg by mouth at bedtime.     . metoprolol succinate (TOPROL-XL) 50 MG 24 hr tablet Take 25 mg by mouth every morning. Take with or immediately following a meal.     . Multiple Vitamins-Minerals (SENTRY SENIOR PO) Take 1 tablet by mouth daily.    . ondansetron (ZOFRAN) 8 MG tablet Take 1 tablet (8 mg  total) by mouth 2 (two) times daily as needed for nausea or vomiting. (Patient not taking: Reported on 04/30/2019) 20 tablet 0  . perphenazine (TRILAFON) 4 MG tablet Take 4 mg by mouth 2 (two) times daily.    . prochlorperazine (COMPAZINE) 10 MG tablet Take 1 tablet (10 mg total) by mouth every 6 (six) hours as needed for nausea or vomiting. (Patient not taking: Reported on 04/30/2019) 30 tablet 0  . trifluridine-tipiracil (LONSURF) 20-8.19 MG tablet Take 3 tablets (60 mg of trifluridine total) by mouth 2 (two) times daily after a meal. Take on days 1-5,8-12. Repeat every 28days 60 tablet 0   No current facility-administered medications for this visit.    Facility-Administered Medications Ordered in Other Visits  Medication Dose Route Frequency Provider Last Rate Last Dose  . heparin lock flush 100 unit/mL  500 Units Intravenous Once Sindy Guadeloupe, MD      . heparin lock flush 100 unit/mL  500 Units Intravenous Once Sindy Guadeloupe, MD      . sodium chloride flush (NS) 0.9 % injection 10 mL  10 mL Intravenous PRN Sindy Guadeloupe, MD   10 mL at 02/06/18 0845  . sodium chloride flush (NS) 0.9 % injection 10 mL  10 mL Intravenous PRN Sindy Guadeloupe, MD   10 mL at 03/27/18 2637    Review of Systems  Constitutional: Negative.  Negative for chills, diaphoresis, fever, malaise/fatigue and weight loss.       She feels "pretty good all in all."   HENT: Negative.  Negative for congestion, ear discharge, ear pain, nosebleeds, sinus pain, sore throat and tinnitus.   Eyes: Negative.  Negative for blurred vision, double vision, photophobia and pain.  Respiratory: Negative.  Negative for cough, hemoptysis, sputum production and shortness of breath.   Cardiovascular: Negative.  Negative for chest pain, palpitations, orthopnea, leg swelling and PND.       H/o asymptomatic bradycardia.   Gastrointestinal: Negative for abdominal pain (somes at night; takes Tylenol), blood in stool, constipation, diarrhea (loose  stools intermittently), heartburn, melena, nausea and vomiting.       Eating well.  Genitourinary: Negative.  Negative for dysuria, frequency, hematuria and urgency.  Musculoskeletal: Negative.  Negative for back pain, joint pain and neck pain.  Skin: Negative.  Negative for itching and rash.  Neurological: Negative for dizziness, tingling, sensory change, speech change, focal weakness, weakness and headaches.  Endo/Heme/Allergies: Negative.  Does not bruise/bleed easily.  Psychiatric/Behavioral: Positive for memory loss. Negative for depression. The patient is nervous/anxious. The patient does not have insomnia.        Schizophrenia.  All other systems reviewed and are negative.  Performance status (ECOG): 2  Blood pressure 113/68, pulse 73, temperature (!) 97.4 F (36.3 C), temperature source Tympanic, resp. rate 18, height '5\' 5"'  (1.651 m), weight 157 lb 11.8 oz (  71.6 kg), SpO2 98 %.   Physical Exam  Constitutional: She appears well-developed and well-nourished. No distress.  Patient sitting comfortably in the exam room eating pretzels.  HENT:  Head: Normocephalic and atraumatic.  Mouth/Throat: Oropharynx is clear and moist. No oropharyngeal exudate.  Wearing a mask and black sparkly cap.  Short thin graying hair.  Eyes: Pupils are equal, round, and reactive to light. Conjunctivae and EOM are normal. No scleral icterus.  Glasses.  Blue eyes.  Neck: Normal range of motion. Neck supple. No JVD present.  Cardiovascular: Normal rate, regular rhythm and normal heart sounds. Exam reveals no gallop.  No murmur heard. Pulmonary/Chest: Effort normal and breath sounds normal. No respiratory distress. She has no wheezes. She has no rales.  Abdominal: Soft. Bowel sounds are normal. She exhibits no distension and no mass. There is no abdominal tenderness. There is no rebound and no guarding.  Musculoskeletal: Normal range of motion.        General: Edema (chronic) present. No tenderness.   Lymphadenopathy:    She has no cervical adenopathy.    She has no axillary adenopathy.       Right: No supraclavicular adenopathy present.       Left: No supraclavicular adenopathy present.  Neurological: She is alert.  Skin: Skin is warm and dry. No rash noted. She is not diaphoretic. No erythema. No pallor.  Psychiatric: She has a normal mood and affect. Her behavior is normal. Judgment normal. Cognition and memory are impaired.  Nursing note reviewed.   Appointment on 05/17/2019  Component Date Value Ref Range Status  . WBC 05/17/2019 8.2  4.0 - 10.5 K/uL Final  . RBC 05/17/2019 2.87* 3.87 - 5.11 MIL/uL Final  . Hemoglobin 05/17/2019 8.2* 12.0 - 15.0 g/dL Final  . HCT 05/17/2019 26.2* 36.0 - 46.0 % Final  . MCV 05/17/2019 91.3  80.0 - 100.0 fL Final  . MCH 05/17/2019 28.6  26.0 - 34.0 pg Final  . MCHC 05/17/2019 31.3  30.0 - 36.0 g/dL Final  . RDW 05/17/2019 20.7* 11.5 - 15.5 % Final  . Platelets 05/17/2019 295  150 - 400 K/uL Final  . nRBC 05/17/2019 0.0  0.0 - 0.2 % Final  . Neutrophils Relative % 05/17/2019 74  % Final  . Neutro Abs 05/17/2019 6.2  1.7 - 7.7 K/uL Final  . Lymphocytes Relative 05/17/2019 14  % Final  . Lymphs Abs 05/17/2019 1.1  0.7 - 4.0 K/uL Final  . Monocytes Relative 05/17/2019 10  % Final  . Monocytes Absolute 05/17/2019 0.9  0.1 - 1.0 K/uL Final  . Eosinophils Relative 05/17/2019 0  % Final  . Eosinophils Absolute 05/17/2019 0.0  0.0 - 0.5 K/uL Final  . Basophils Relative 05/17/2019 1  % Final  . Basophils Absolute 05/17/2019 0.0  0.0 - 0.1 K/uL Final  . Immature Granulocytes 05/17/2019 1  % Final  . Abs Immature Granulocytes 05/17/2019 0.04  0.00 - 0.07 K/uL Final   Performed at [facility], [address] [place], [place] [address]    Assessment:  Maybelle Depaoli is a 74 y.o. female with stage IV colon cancerwith peritoneal metastasis and possible liver metastasis. She is s/p exploratory laparotomy with right hemicolectomy and  terminal ileum resection and separate small bowel resection on 12/06/2017. 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 Radial margin was positive for invasive carcinoma. There were 2 colon adenomas (2.5 and 1.8 cm) of the ascending colon. Metastatic  adenocarcinoma was seen in 4 of 13 lymph nodes. There were 2 tumor deposits. There was a segment of small intestine with abscess which also showed adenocarcinoma invading the distal appendix with perforation. One mesenteric lymph node was negative for malignancy. Retroperitoneal tumor removal was positive for mucinous adenocarcinoma. Proximal and distal margins were negative. Pathologic stagewas [id number]. MSI stable. KRASmutation was positive  She received 5 cycles of FOLFOX(01/10/2018 - 03/27/2018). She began Neulasta with cycle #2. Oxaliplatin was decreased from 85 mg/m2 to 65 mg/m2 with cycle #4. She received 6 cycles of5FU + LV with Avastin(05/01/2018 - 07/17/2018).  She received 10 cycles of FOLIFIRI + Avastin(07/31/2018 - 02/13/2019). She receives chemotherapy every 3 weeks secondary to tolerance. Irinotecan was decreased from 150 mg/m2 to 125 mg/m2 with cycle #2. 5FU bolus was discontinued with cycle #6.  Chest, abdomen, and pelvic CTon 03/02/2019 revealed progressive disease within the abdomen and pelvis. There was increase in soft tissue thickening along the surgical site at the ileocolic junction (2.9 x 1.9 cm to 4.6 x 2.5 cm). There was new and progressive peritoneal metastasis (2.1 x 2.4 cm). There was no bowel obstruction or other acute complication. There was enlarging soft tissue metastasis on the right pelvic oblique musculature (1.1 cm to 1.7 cm).  She isday 34 ofcycle #2Lonsurf (03/14/2019- 04/16/2019).  Cycle #2 was notable for pancytopenia.  She had grade III anemia and required 1 unit of PRBCs on 05/04/2019.  CEAhas been followed: 4.8 on  12/04/2017, 3.1 on 01/10/2018, 4.2 on 03/06/2018, 3.8 on 05/01/2018, 9.2 on 07/31/2018, 5.9 on 09/18/2018, 5.0 on 10/30/2018, 5.0 on 02/13/2019, 7.5 on 03/13/2019, and 8.1 on 04/12/2019.  She has macrocytic RBC indices. She has a history of alow B12 level. B12 was 266 on 01/30/2018 and 2448 on 01/22/2019. Folatewas 32 on 01/30/2018 and 45 on 01/22/2019. TSHwas 0.913 on 12/03/2017 and 1.627 on 01/22/2019.  Ferritin was 535 on 04/30/2019.  She has paranoid schizophrenia with delusions. She lives in a group home.  Symptomatically, she feels good.  She has intermittent abdominal pain at night relived by Tylenol.  Hemoglobin 8.2.  WBC 8,200.   Plan: 1.   Labs today: CBC with diff, CMP, Mg, CEA. 2.Stage IV colon cancer Sheis s/pFOLFOX then 5FU + LV + Avastin. She is s/pFOLFIRI + Avastin She isday 34s/pcycle #2Lonsurf. She is doing well.  Counts have recovered. Begin cycle #3 on Monday, 05/21/2019. Given issues with last cycle, discuss plan for close monitoring. Review plans for restaging CT scans after cycle #3. 3.Chemotherapy induced anemia Hemoglobin 8.2. MCV 91.3.  WBC 8,200. Platelets 295,000.  Ferritin normal on 04/30/2019.  B12 and folate normal on 01/22/2019. Discuss plan for full dose chemotherapy per Nuala Alpha, PharmD and dose reduction criteria.  Preauth Retacrit to hopefully avoid PRBC transfusion. 4.   RTC on 05/21/2019 for labs (CBC) +/- Retacrit 5.   RTC on 05/28/2019 for MD assessment, labs (CBC with diff, BMP), and +/- Retacrit.  I discussed the assessment and treatment plan with the patient.  The patient was provided an opportunity to ask questions and all were answered.  The patient agreed with the plan and demonstrated an understanding of the instructions.  The patient was advised to call back if the symptoms worsen or if the condition fails to improve as anticipated.  I provided 20 minutes of face-to-face time during  this this encounter and > 50% was spent counseling as documented under my assessment and plan.    Lequita Asal, MD, PhD  05/17/2019, 11:08 AM  I, Molly Dorshimer, am acting as Education administrator for [employer]. Mike Gip, MD, PhD.  I, Melissa C. Mike Gip, MD, have reviewed the above documentation for accuracy and completeness, and I agree with the above.

## 2019-05-17 ENCOUNTER — Other Ambulatory Visit: Payer: Self-pay

## 2019-05-17 ENCOUNTER — Inpatient Hospital Stay: Payer: Medicare Other

## 2019-05-17 ENCOUNTER — Inpatient Hospital Stay (HOSPITAL_BASED_OUTPATIENT_CLINIC_OR_DEPARTMENT_OTHER): Payer: Medicare Other | Admitting: Hematology and Oncology

## 2019-05-17 ENCOUNTER — Encounter: Payer: Self-pay | Admitting: Hematology and Oncology

## 2019-05-17 VITALS — BP 113/68 | HR 73 | Temp 97.4°F | Resp 18 | Ht 65.0 in | Wt 157.7 lb

## 2019-05-17 DIAGNOSIS — D6481 Anemia due to antineoplastic chemotherapy: Secondary | ICD-10-CM | POA: Diagnosis not present

## 2019-05-17 DIAGNOSIS — C786 Secondary malignant neoplasm of retroperitoneum and peritoneum: Secondary | ICD-10-CM

## 2019-05-17 DIAGNOSIS — E871 Hypo-osmolality and hyponatremia: Secondary | ICD-10-CM

## 2019-05-17 DIAGNOSIS — C180 Malignant neoplasm of cecum: Secondary | ICD-10-CM | POA: Diagnosis not present

## 2019-05-17 DIAGNOSIS — Z79899 Other long term (current) drug therapy: Secondary | ICD-10-CM

## 2019-05-17 DIAGNOSIS — C7989 Secondary malignant neoplasm of other specified sites: Secondary | ICD-10-CM

## 2019-05-17 DIAGNOSIS — D649 Anemia, unspecified: Secondary | ICD-10-CM

## 2019-05-17 DIAGNOSIS — C189 Malignant neoplasm of colon, unspecified: Secondary | ICD-10-CM

## 2019-05-17 DIAGNOSIS — T451X5S Adverse effect of antineoplastic and immunosuppressive drugs, sequela: Secondary | ICD-10-CM

## 2019-05-17 DIAGNOSIS — Z9221 Personal history of antineoplastic chemotherapy: Secondary | ICD-10-CM

## 2019-05-17 DIAGNOSIS — F200 Paranoid schizophrenia: Secondary | ICD-10-CM

## 2019-05-17 DIAGNOSIS — D61818 Other pancytopenia: Secondary | ICD-10-CM

## 2019-05-17 DIAGNOSIS — Z7189 Other specified counseling: Secondary | ICD-10-CM

## 2019-05-17 LAB — CBC WITH DIFFERENTIAL/PLATELET
Abs Immature Granulocytes: 0.04 10*3/uL (ref 0.00–0.07)
Basophils Absolute: 0 10*3/uL (ref 0.0–0.1)
Basophils Relative: 1 %
Eosinophils Absolute: 0 10*3/uL (ref 0.0–0.5)
Eosinophils Relative: 0 %
HCT: 26.2 % — ABNORMAL LOW (ref 36.0–46.0)
Hemoglobin: 8.2 g/dL — ABNORMAL LOW (ref 12.0–15.0)
Immature Granulocytes: 1 %
Lymphocytes Relative: 14 %
Lymphs Abs: 1.1 10*3/uL (ref 0.7–4.0)
MCH: 28.6 pg (ref 26.0–34.0)
MCHC: 31.3 g/dL (ref 30.0–36.0)
MCV: 91.3 fL (ref 80.0–100.0)
Monocytes Absolute: 0.9 10*3/uL (ref 0.1–1.0)
Monocytes Relative: 10 %
Neutro Abs: 6.2 10*3/uL (ref 1.7–7.7)
Neutrophils Relative %: 74 %
Platelets: 295 10*3/uL (ref 150–400)
RBC: 2.87 MIL/uL — ABNORMAL LOW (ref 3.87–5.11)
RDW: 20.7 % — ABNORMAL HIGH (ref 11.5–15.5)
WBC: 8.2 10*3/uL (ref 4.0–10.5)
nRBC: 0 % (ref 0.0–0.2)

## 2019-05-17 LAB — RETICULOCYTES
Immature Retic Fract: 29.4 % — ABNORMAL HIGH (ref 2.3–15.9)
RBC.: 2.82 MIL/uL — ABNORMAL LOW (ref 3.87–5.11)
Retic Count, Absolute: 96.4 10*3/uL (ref 19.0–186.0)
Retic Ct Pct: 3.4 % — ABNORMAL HIGH (ref 0.4–3.1)

## 2019-05-17 LAB — COMPREHENSIVE METABOLIC PANEL
ALT: 36 U/L (ref 0–44)
AST: 27 U/L (ref 15–41)
Albumin: 2.7 g/dL — ABNORMAL LOW (ref 3.5–5.0)
Alkaline Phosphatase: 132 U/L — ABNORMAL HIGH (ref 38–126)
Anion gap: 11 (ref 5–15)
BUN: 13 mg/dL (ref 8–23)
CO2: 23 mmol/L (ref 22–32)
Calcium: 8.8 mg/dL — ABNORMAL LOW (ref 8.9–10.3)
Chloride: 98 mmol/L (ref 98–111)
Creatinine, Ser: 0.66 mg/dL (ref 0.44–1.00)
GFR calc Af Amer: >60 mL/min (ref >60)
GFR calc non Af Amer: >60 mL/min (ref >60)
Glucose, Bld: 99 mg/dL (ref 70–99)
Potassium: 4.1 mmol/L (ref 3.5–5.1)
Sodium: 132 mmol/L — ABNORMAL LOW (ref 135–145)
Total Bilirubin: 0.5 mg/dL (ref 0.3–1.2)
Total Protein: 6.7 g/dL (ref 6.5–8.1)

## 2019-05-17 LAB — MAGNESIUM: Magnesium: 2.2 mg/dL (ref 1.7–2.4)

## 2019-05-17 LAB — LACTATE DEHYDROGENASE: LDH: 124 U/L (ref 98–192)

## 2019-05-17 MED FILL — LONSURF 20 MG-8.19 MG TAB: 20-8.19 | 28 days supply | Qty: 60 | Fill #0

## 2019-05-17 NOTE — Progress Notes (Signed)
No new changes noted today 

## 2019-05-18 LAB — CEA: CEA: 10.7 ng/mL — ABNORMAL HIGH (ref 0.0–4.7)

## 2019-05-21 ENCOUNTER — Other Ambulatory Visit: Payer: Self-pay

## 2019-05-21 ENCOUNTER — Inpatient Hospital Stay: Payer: Medicare Other

## 2019-05-21 VITALS — BP 113/73 | HR 77 | Temp 97.0°F | Resp 18

## 2019-05-21 DIAGNOSIS — C189 Malignant neoplasm of colon, unspecified: Secondary | ICD-10-CM

## 2019-05-21 DIAGNOSIS — Z23 Encounter for immunization: Secondary | ICD-10-CM

## 2019-05-21 DIAGNOSIS — C180 Malignant neoplasm of cecum: Secondary | ICD-10-CM | POA: Diagnosis not present

## 2019-05-21 DIAGNOSIS — D701 Agranulocytosis secondary to cancer chemotherapy: Secondary | ICD-10-CM

## 2019-05-21 LAB — CBC
HCT: 26.8 % — ABNORMAL LOW (ref 36.0–46.0)
Hemoglobin: 8.3 g/dL — ABNORMAL LOW (ref 12.0–15.0)
MCH: 29 pg (ref 26.0–34.0)
MCHC: 31 g/dL (ref 30.0–36.0)
MCV: 93.7 fL (ref 80.0–100.0)
Platelets: 256 10*3/uL (ref 150–400)
RBC: 2.86 MIL/uL — ABNORMAL LOW (ref 3.87–5.11)
RDW: 21.4 % — ABNORMAL HIGH (ref 11.5–15.5)
WBC: 10.8 10*3/uL — ABNORMAL HIGH (ref 4.0–10.5)
nRBC: 0 % (ref 0.0–0.2)

## 2019-05-21 MED ORDER — EPOETIN ALFA-EPBX 10000 UNIT/ML IJ SOLN
10000.0000 [IU] | Freq: Once | INTRAMUSCULAR | Status: AC
  Administered 2019-05-21: 10000 [IU] via SUBCUTANEOUS

## 2019-05-24 ENCOUNTER — Ambulatory Visit: Payer: Medicare Other | Admitting: Hematology and Oncology

## 2019-05-24 ENCOUNTER — Other Ambulatory Visit: Payer: Medicare Other

## 2019-05-27 NOTE — Progress Notes (Signed)
[facility]  [address] [place], [place] [address] Phone: [phone]  Fax: [phone]   Clinic Day:  05/28/2019  Referring physician: Gae Bon, NP  Chief Complaint: Latoya Jones is a 73 y.o. female withstage IV colon cancer who is seen forassessmenton day8of cycle#3Lonsurf.  HPI: The patient was last seen in the medical oncology clinic on 05/17/2019. At that time, she was doing "pretty good."  She had occasional abdominal pain at night and was using Tylenol. Hemoglobin was 8.2, hematocrit 26.2.  CEA was 10.7.   She received Retacrit on 05/21/2019.  Hemoglobin was 8.3. She tolerated it well.   During the interim, she is doing "fine." She feels about the same. She notes on episode of vomiting, which she attributes to eating too fast. Energy level has been good and she is active. She denies any diarrhea. She has not yet seen her psychiatrist.   She continues to have abdominal pain for which she takes Tylenol at night.  She describes her abdominal discomfort being associated with needing to pass a BM, which is formed and normal when it occurs.   She has continued to try and increase her salt intake.  [name] notes she is eating well, but has been stressed. On Saturday, she was sweaty and clammy, with no abnormal temperature and normal blood pressure.  This occurred during a schizophrenic episode and has not recurred. She scratches her lower back and coccyx occasionally, which has opened her wound slightly again.    Past Medical History:  Diagnosis Date  . Colon cancer ([place])   . Hypertension     Past Surgical History:  Procedure Laterality Date  . COLOSTOMY REVISION N/A 12/06/2017   Procedure: COLON RESECTION RIGHT;  Surgeon: Florene Glen, MD;  Location: [facility] ORS;  Service: General;  Laterality: N/A;  . DILATION AND CURETTAGE, DIAGNOSTIC / THERAPEUTIC    . PORTACATH PLACEMENT N/A 01/06/2018   Procedure: INSERTION  PORT-A-CATH;  Surgeon: Vickie Epley, MD;  Location: [facility] ORS;  Service: General;  Laterality: N/A;    Family History  Problem Relation Age of Onset  . ALS Mother   . ALS Father     Social History:  reports that she quit smoking about 11 years ago. She has never used smokeless tobacco. She reports that she does not drink alcohol or use drugs. She lives in a family care home; generally accompanied by Latoya Jones. She is alonein clinictoday.I spoke with Latoya Jones on the phone (she was in the parking lot; [phone]).  Allergies: No Known Allergies  Current Medications: Current Outpatient Medications  Medication Sig Dispense Refill  . acetaminophen (TYLENOL) 500 MG tablet Take 1,000 mg by mouth every 8 (eight) hours as needed for mild pain.    Marland Kitchen alendronate (FOSAMAX) 70 MG tablet Take 70 mg by mouth once a week.    Marland Kitchen amLODipine (NORVASC) 10 MG tablet Take 10 mg by mouth daily.     . ARIPiprazole (ABILIFY) 15 MG tablet Take 7.5 mg by mouth daily.    Marland Kitchen atorvastatin (LIPITOR) 20 MG tablet Take 1 tablet by mouth daily.    . calcium carbonate (TUMS - DOSED IN MG ELEMENTAL CALCIUM) 500 MG chewable tablet Chew 1 tablet by mouth 2 (two) times daily as needed for indigestion or heartburn.    . Calcium Carbonate-Vitamin D3 (CALCIUM 600-D) 600-400 MG-UNIT TABS Take 1 tablet by mouth daily.    Marland Kitchen loratadine (CLARITIN) 10 MG tablet Take 1 tablet (10 mg  total) by mouth daily as needed for allergies. Take 1 tablet the day before pump removal, 1 tablet the day of pump removal and 1 tablet the day after pump removal. Take as directed with each cycle of  chemotherapy treatment 30 tablet 1  . memantine (NAMENDA XR) 14 MG CP24 24 hr capsule Take 14 mg by mouth at bedtime.     . metoprolol succinate (TOPROL-XL) 50 MG 24 hr tablet Take 25 mg by mouth every morning. Take with or immediately following a meal.     . Multiple Vitamins-Minerals (SENTRY SENIOR PO) Take 1 tablet by mouth daily.    Marland Kitchen perphenazine  (TRILAFON) 4 MG tablet Take 4 mg by mouth 2 (two) times daily.    Marland Kitchen trifluridine-tipiracil (LONSURF) 20-8.19 MG tablet Take 3 tablets (60 mg of trifluridine total) by mouth 2 (two) times daily after a meal. Take on days 1-5,8-12. Repeat every 28days 60 tablet 0  . lidocaine-prilocaine (EMLA) cream Apply 1 application topically as needed. 1 hour prior to  each chemotherapy treatment. Place small amount over port site and place saran wrap over the cream to protect clothing (Patient not taking: Reported on 05/28/2019) 30 g 0  . ondansetron (ZOFRAN) 8 MG tablet Take 1 tablet (8 mg total) by mouth 2 (two) times daily as needed for nausea or vomiting. (Patient not taking: Reported on 05/17/2019) 20 tablet 0  . prochlorperazine (COMPAZINE) 10 MG tablet Take 1 tablet (10 mg total) by mouth every 6 (six) hours as needed for nausea or vomiting. (Patient not taking: Reported on 05/17/2019) 30 tablet 0   No current facility-administered medications for this visit.    Facility-Administered Medications Ordered in Other Visits  Medication Dose Route Frequency Provider Last Rate Last Dose  . heparin lock flush 100 unit/mL  500 Units Intravenous Once Sindy Guadeloupe, MD      . heparin lock flush 100 unit/mL  500 Units Intravenous Once Sindy Guadeloupe, MD      . sodium chloride flush (NS) 0.9 % injection 10 mL  10 mL Intravenous PRN Sindy Guadeloupe, MD   10 mL at 02/06/18 0845  . sodium chloride flush (NS) 0.9 % injection 10 mL  10 mL Intravenous PRN Sindy Guadeloupe, MD   10 mL at 03/27/18 0909    Review of Systems  Constitutional: Positive for weight loss (2lbs). Negative for chills, diaphoresis, fever and malaise/fatigue.        She is "pretty good all in all."    HENT: Negative.  Negative for congestion, hearing loss, sinus pain and sore throat.   Eyes: Negative.  Negative for blurred vision.  Respiratory: Negative.  Negative for cough, sputum production, shortness of breath and wheezing.   Cardiovascular: Negative.   Negative for chest pain, palpitations, orthopnea, leg swelling and PND.       H/o asymptomatic bradycardia.    Gastrointestinal: Positive for abdominal pain (stomach, tylenol 1x nightly) and vomiting (1 episode when eating too much, resolved). Negative for blood in stool, constipation, diarrhea, melena and nausea.  Genitourinary: Negative.  Negative for dysuria, frequency, hematuria and urgency.  Musculoskeletal: Negative.  Negative for back pain, falls, joint pain, myalgias and neck pain.  Skin: Negative.  Negative for rash.       Raw skin on her coccyx has healed.   Neurological: Negative.  Negative for dizziness, tingling, sensory change, speech change, focal weakness, weakness and headaches.  Endo/Heme/Allergies: Negative.  Does not bruise/bleed easily.  Psychiatric/Behavioral: Positive for memory  loss. Negative for depression and substance abuse. The patient is not nervous/anxious and does not have insomnia.        Schizophrenia  All other systems reviewed and are negative.  Performance status (ECOG):  2  Blood pressure 112/63, pulse 74, temperature (!) 97.3 F (36.3 C), temperature source Tympanic, resp. rate 18, weight 155 lb 15.6 oz (70.8 kg), SpO2 100 %.   Physical Exam  Constitutional: She appears well-developed and well-nourished. No distress.  Wearing a black cap and mask.  Short thin graying hair.  No mouth sores.   HENT:  Head: Normocephalic and atraumatic.  Mouth/Throat: Oropharynx is clear and moist. No oropharyngeal exudate.  Glasses.  Blue eyes.   Eyes: Pupils are equal, round, and reactive to light. Conjunctivae and EOM are normal. No scleral icterus.  Neck: Normal range of motion. Neck supple. No JVD present.  Cardiovascular: Normal rate, regular rhythm and normal heart sounds.  No murmur heard. Pulmonary/Chest: Effort normal and breath sounds normal. No respiratory distress. She has no wheezes. She has no rales. She exhibits no tenderness.  Abdominal: Soft. Bowel  sounds are normal. She exhibits no distension and no mass. There is no abdominal tenderness. There is no rebound and no guarding.  Musculoskeletal: Normal range of motion.        General: Edema (trace bilateral ankles) present.  Lymphadenopathy:    She has no cervical adenopathy.    She has no axillary adenopathy.       Right: No supraclavicular adenopathy present.       Left: No supraclavicular adenopathy present.  Neurological: She is alert.  Skin: Skin is warm and dry. No rash noted. No erythema. There is pallor.  Coccyx area healed with hyperpigmentation.   Psychiatric: She has a normal mood and affect. Her behavior is normal. Judgment and thought content normal. Cognition and memory are impaired.  Schizophrenia.  Nursing note and vitals reviewed.   Appointment on 05/28/2019  Component Date Value Ref Range Status  . Sodium 05/28/2019 130* 135 - 145 mmol/L Final  . Potassium 05/28/2019 3.8  3.5 - 5.1 mmol/L Final  . Chloride 05/28/2019 98  98 - 111 mmol/L Final  . CO2 05/28/2019 24  22 - 32 mmol/L Final  . Glucose, Bld 05/28/2019 113* 70 - 99 mg/dL Final  . BUN 05/28/2019 12  8 - 23 mg/dL Final  . Creatinine, Ser 05/28/2019 0.57  0.44 - 1.00 mg/dL Final  . Calcium 05/28/2019 8.9  8.9 - 10.3 mg/dL Final  . GFR calc non Af Amer 05/28/2019 >60  >60 mL/min Final  . GFR calc Af Amer 05/28/2019 >60  >60 mL/min Final  . Anion gap 05/28/2019 8  5 - 15 Final   Performed at [facility], [address] [place], [place] [address]  . WBC 05/28/2019 7.0  4.0 - 10.5 K/uL Final  . RBC 05/28/2019 2.61* 3.87 - 5.11 MIL/uL Final  . Hemoglobin 05/28/2019 7.6* 12.0 - 15.0 g/dL Final  . HCT 05/28/2019 24.4* 36.0 - 46.0 % Final  . MCV 05/28/2019 93.5  80.0 - 100.0 fL Final  . MCH 05/28/2019 29.1  26.0 - 34.0 pg Final  . MCHC 05/28/2019 31.1  30.0 - 36.0 g/dL Final  . RDW 05/28/2019 21.5* 11.5 - 15.5 % Final  . Platelets 05/28/2019 215  150 - 400 K/uL Final  . nRBC 05/28/2019 0.3*  0.0 - 0.2 % Final  . Neutrophils Relative % 05/28/2019 78  % Final  . Neutro Abs  05/28/2019 5.6  1.7 - 7.7 K/uL Final  . Lymphocytes Relative 05/28/2019 15  % Final  . Lymphs Abs 05/28/2019 1.0  0.7 - 4.0 K/uL Final  . Monocytes Relative 05/28/2019 5  % Final  . Monocytes Absolute 05/28/2019 0.3  0.1 - 1.0 K/uL Final  . Eosinophils Relative 05/28/2019 0  % Final  . Eosinophils Absolute 05/28/2019 0.0  0.0 - 0.5 K/uL Final  . Basophils Relative 05/28/2019 1  % Final  . Basophils Absolute 05/28/2019 0.1  0.0 - 0.1 K/uL Final  . Immature Granulocytes 05/28/2019 1  % Final  . Abs Immature Granulocytes 05/28/2019 0.04  0.00 - 0.07 K/uL Final   Performed at [facility], [address] [place], [place] [address]    Assessment:  Darlyne Schmiesing is a 73 y.o. female with stage IV colon cancerwith peritoneal metastasis and possible liver metastasis. She is s/p exploratory laparotomy with right hemicolectomy and terminal ileum resection and separate small bowel resection on 12/06/2017. 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 Radial margin was positive for invasive carcinoma. There were 2 colon adenomas (2.5 and 1.8 cm) of the ascending colon. Metastatic adenocarcinoma was seen in 4 of 13 lymph nodes. There were 2 tumor deposits. There was a segment of small intestine with abscess which also showed adenocarcinoma invading the distal appendix with perforation. One mesenteric lymph node was negative for malignancy. Retroperitoneal tumor removal was positive for mucinous adenocarcinoma. Proximal and distal margins were negative. Pathologic stagewas [id number]. MSI stable. KRASmutation was positive  She received 5 cycles of FOLFOX(01/10/2018 - 03/27/2018). She began Neulasta with cycle #2. Oxaliplatin was decreased from 85 mg/m2 to 65 mg/m2 with cycle #4. She received 6 cycles of5FU + LV with Avastin(05/01/2018 -  07/17/2018).  She received 10 cycles of FOLIFIRI + Avastin(07/31/2018 - 02/13/2019). She receives chemotherapy every 3 weeks secondary to tolerance. Irinotecan was decreased from 150 mg/m2 to 125 mg/m2 with cycle #2. 5FU bolus was discontinued with cycle #6.  Chest, abdomen, and pelvic CTon 03/02/2019 revealed progressive disease within the abdomen and pelvis. There was increase in soft tissue thickening along the surgical site at the ileocolic junction (2.9 x 1.9 cm to 4.6 x 2.5 cm). There was new and progressive peritoneal metastasis (2.1 x 2.4 cm). There was no bowel obstruction or other acute complication. There was enlarging soft tissue metastasis on the right pelvic oblique musculature (1.1 cm to 1.7 cm).  She isday32ofcycle #2Lonsurf (03/14/2019- 04/16/2019).Cycle #2 was notable for pancytopenia. She had grade III anemia and required 1 unit of PRBCs on 05/04/2019.  CEAhas been followed: 4.8 on 12/04/2017, 3.1 on 01/10/2018, 4.2 on 03/06/2018, 3.8 on 05/01/2018, 9.2 on 07/31/2018, 5.9 on 09/18/2018, 5.0 on 10/30/2018, 5.0 on 02/13/2019, 7.5 on 03/13/2019, and 8.1 on 04/12/2019.  She has macrocytic RBC indices. She has a history of alow B12 level. B12 was 266 on 01/30/2018 and 2448 on 01/22/2019. Folatewas 32 on 01/30/2018 and 45 on 01/22/2019. TSHwas 0.913 on 12/03/2017 and 1.627 on 01/22/2019.  Ferritin was 535 on 04/30/2019.  She has paranoid schizophrenia with delusions. She lives in a group home.  Symptomatically, she is doing well.  She denies any increased fatigue, mouth sores, or diarrhea associated with treatment.  Exam is stable.  Sodium is 130.  Plan: 1.  Labs today: CBC w/ diff,BMP. 2.Stage IV colon cancer Sheis s/pFOLFOX then 5FU + LV + Avastin. She is s/pFOLFIRI + Avastin She isday 8s/pcycle #3Lonsurf. Clinically, she is  doing well.  Counts are stable. Continue cycle #3 with days 8-12 Lonsurf this week. Review plans for restaging  CT scans after this cycle. 3.Chemotherapy induced anemia Hemoglobin 7.6. MCV 93.5.  WBC 7,000. Platelets 215,000.             Ferritin normal on 04/30/2019.  B12 and folate normal on 01/22/2019. Retacrit today. 4.   Hyponatremia  Encourage fluids with electrolytes and added salt. 5.   RTC on 06/04/2019 for MD assessment, labs (CBC with diff, BMP, hold tube), and +/- Retacrit, and +/- PRBC transfusion on 07/14.  I discussed the assessment and treatment plan with the patient.  The patient was provided an opportunity to ask questions and all were answered.  The patient agreed with the plan and demonstrated an understanding of the instructions.  The patient was advised to call back if the symptoms worsen or if the condition fails to improve as anticipated.  I provided 20 minutes of face-to-face time during this this encounter and > 50% was spent counseling as documented under my assessment and plan.    Lequita Asal, MD, PhD    05/28/2019, 10:16 AM  I, Molly Dorshimer, am acting as Education administrator for [employer]. Mike Gip, MD, PhD.  I, Melissa C. Mike Gip, MD, have reviewed the above documentation for accuracy and completeness, and I agree with the above.

## 2019-05-28 ENCOUNTER — Inpatient Hospital Stay: Payer: Medicare Other | Attending: Hematology and Oncology | Admitting: Hematology and Oncology

## 2019-05-28 ENCOUNTER — Other Ambulatory Visit: Payer: Self-pay

## 2019-05-28 ENCOUNTER — Inpatient Hospital Stay: Payer: Medicare Other

## 2019-05-28 ENCOUNTER — Encounter: Payer: Self-pay | Admitting: Hematology and Oncology

## 2019-05-28 VITALS — BP 112/63 | HR 74 | Temp 97.3°F | Resp 18 | Wt 156.0 lb

## 2019-05-28 DIAGNOSIS — C181 Malignant neoplasm of appendix: Secondary | ICD-10-CM | POA: Insufficient documentation

## 2019-05-28 DIAGNOSIS — C189 Malignant neoplasm of colon, unspecified: Secondary | ICD-10-CM

## 2019-05-28 DIAGNOSIS — C786 Secondary malignant neoplasm of retroperitoneum and peritoneum: Secondary | ICD-10-CM | POA: Diagnosis not present

## 2019-05-28 DIAGNOSIS — Z7189 Other specified counseling: Secondary | ICD-10-CM

## 2019-05-28 DIAGNOSIS — D6481 Anemia due to antineoplastic chemotherapy: Secondary | ICD-10-CM

## 2019-05-28 DIAGNOSIS — C7989 Secondary malignant neoplasm of other specified sites: Secondary | ICD-10-CM | POA: Insufficient documentation

## 2019-05-28 DIAGNOSIS — I10 Essential (primary) hypertension: Secondary | ICD-10-CM | POA: Diagnosis not present

## 2019-05-28 DIAGNOSIS — F200 Paranoid schizophrenia: Secondary | ICD-10-CM | POA: Insufficient documentation

## 2019-05-28 DIAGNOSIS — E538 Deficiency of other specified B group vitamins: Secondary | ICD-10-CM

## 2019-05-28 DIAGNOSIS — E871 Hypo-osmolality and hyponatremia: Secondary | ICD-10-CM

## 2019-05-28 DIAGNOSIS — Z79899 Other long term (current) drug therapy: Secondary | ICD-10-CM | POA: Diagnosis not present

## 2019-05-28 DIAGNOSIS — Z23 Encounter for immunization: Secondary | ICD-10-CM

## 2019-05-28 DIAGNOSIS — D701 Agranulocytosis secondary to cancer chemotherapy: Secondary | ICD-10-CM

## 2019-05-28 DIAGNOSIS — C787 Secondary malignant neoplasm of liver and intrahepatic bile duct: Secondary | ICD-10-CM | POA: Diagnosis not present

## 2019-05-28 DIAGNOSIS — Z87891 Personal history of nicotine dependence: Secondary | ICD-10-CM | POA: Diagnosis not present

## 2019-05-28 DIAGNOSIS — T451X5A Adverse effect of antineoplastic and immunosuppressive drugs, initial encounter: Secondary | ICD-10-CM | POA: Diagnosis not present

## 2019-05-28 LAB — CBC WITH DIFFERENTIAL/PLATELET
Abs Immature Granulocytes: 0.04 10*3/uL (ref 0.00–0.07)
Basophils Absolute: 0.1 10*3/uL (ref 0.0–0.1)
Basophils Relative: 1 %
Eosinophils Absolute: 0 10*3/uL (ref 0.0–0.5)
Eosinophils Relative: 0 %
HCT: 24.4 % — ABNORMAL LOW (ref 36.0–46.0)
Hemoglobin: 7.6 g/dL — ABNORMAL LOW (ref 12.0–15.0)
Immature Granulocytes: 1 %
Lymphocytes Relative: 15 %
Lymphs Abs: 1 10*3/uL (ref 0.7–4.0)
MCH: 29.1 pg (ref 26.0–34.0)
MCHC: 31.1 g/dL (ref 30.0–36.0)
MCV: 93.5 fL (ref 80.0–100.0)
Monocytes Absolute: 0.3 10*3/uL (ref 0.1–1.0)
Monocytes Relative: 5 %
Neutro Abs: 5.6 10*3/uL (ref 1.7–7.7)
Neutrophils Relative %: 78 %
Platelets: 215 10*3/uL (ref 150–400)
RBC: 2.61 MIL/uL — ABNORMAL LOW (ref 3.87–5.11)
RDW: 21.5 % — ABNORMAL HIGH (ref 11.5–15.5)
WBC: 7 10*3/uL (ref 4.0–10.5)
nRBC: 0.3 % — ABNORMAL HIGH (ref 0.0–0.2)

## 2019-05-28 LAB — BASIC METABOLIC PANEL
Anion gap: 8 (ref 5–15)
BUN: 12 mg/dL (ref 8–23)
CO2: 24 mmol/L (ref 22–32)
Calcium: 8.9 mg/dL (ref 8.9–10.3)
Chloride: 98 mmol/L (ref 98–111)
Creatinine, Ser: 0.57 mg/dL (ref 0.44–1.00)
GFR calc Af Amer: >60 mL/min (ref >60)
GFR calc non Af Amer: >60 mL/min (ref >60)
Glucose, Bld: 113 mg/dL — ABNORMAL HIGH (ref 70–99)
Potassium: 3.8 mmol/L (ref 3.5–5.1)
Sodium: 130 mmol/L — ABNORMAL LOW (ref 135–145)

## 2019-05-28 MED ORDER — EPOETIN ALFA-EPBX 10000 UNIT/ML IJ SOLN
10000.0000 [IU] | Freq: Once | INTRAMUSCULAR | Status: AC
  Administered 2019-05-28: 11:00:00 10000 [IU] via SUBCUTANEOUS

## 2019-05-28 NOTE — Patient Instructions (Signed)

## 2019-05-28 NOTE — Progress Notes (Signed)
Pt here for follow up. Denies any concerns.  

## 2019-06-01 NOTE — Progress Notes (Signed)
[facility]  [address], [address] [place], [place] [address] Phone: [phone]  Fax: [phone]   Clinic Day:  06/04/2019  Referring physician: Gae Bon, NP  Chief Complaint: Latoya Jones is a 73 y.o. female withstage IV colon cancer who is seen forassessmenton day15of cycle#3Lonsurf.  HPI: The patient was last seen in the medical oncology clinic on 05/28/2019. At that time, she was doing well.  She denied any increased fatigue, mouth sores, or diarrhea associated with treatment.  Exam was stable. Sodium was 130. She received Retacrit.   During the interim, she is doing okay. She continues to be fatigued. She reports 2-3 days of constipation alleviated with prune juice, followed by about 10 episodes of diarrhea yesterday and one episode this morning.    [name] notes continued issues with eating this weekend, with an episode of vomiting after putting too much food in her mouth.  [name] has had her eat separately from other residents, which has improved this issue.   She was visibly distressed in the office today. She has an appointment with her psychiatrist scheduled for 06/08/2019.    Past Medical History:  Diagnosis Date   Colon cancer ([place])    Hypertension     Past Surgical History:  Procedure Laterality Date   COLOSTOMY REVISION N/A 12/06/2017   Procedure: COLON RESECTION RIGHT;  Surgeon: Florene Glen, MD;  Location: [facility] ORS;  Service: General;  Laterality: N/A;   DILATION AND CURETTAGE, DIAGNOSTIC / THERAPEUTIC     PORTACATH PLACEMENT N/A 01/06/2018   Procedure: INSERTION PORT-A-CATH;  Surgeon: Vickie Epley, MD;  Location: [facility] ORS;  Service: General;  Laterality: N/A;    Family History  Problem Relation Age of Onset   ALS Mother    ALS Father     Social History:  reports that she quit smoking about 11 years ago. She has never used smokeless tobacco. She reports that she does not drink alcohol or use drugs.  She lives in a family care home; generally accompanied by [name]. She is alonein clinictoday.I spoke with [name] on the phone (she was in the parking lot)[phone].  Allergies: No Known Allergies  Current Medications: Current Outpatient Medications  Medication Sig Dispense Refill   acetaminophen (TYLENOL) 500 MG tablet Take 1,000 mg by mouth every 8 (eight) hours as needed for mild pain.     alendronate (FOSAMAX) 70 MG tablet Take 70 mg by mouth once a week.     amLODipine (NORVASC) 10 MG tablet Take 10 mg by mouth daily.      ARIPiprazole (ABILIFY) 15 MG tablet Take 7.5 mg by mouth daily.     atorvastatin (LIPITOR) 20 MG tablet Take 1 tablet by mouth daily.     calcium carbonate (TUMS - DOSED IN MG ELEMENTAL CALCIUM) 500 MG chewable tablet Chew 1 tablet by mouth 2 (two) times daily as needed for indigestion or heartburn.     Calcium Carbonate-Vitamin D3 (CALCIUM 600-D) 600-400 MG-UNIT TABS Take 1 tablet by mouth daily.     loratadine (CLARITIN) 10 MG tablet Take 1 tablet (10 mg total) by mouth daily as needed for allergies. Take 1 tablet the day before pump removal, 1 tablet the day of pump removal and 1 tablet the day after pump removal. Take as directed with each cycle of  chemotherapy treatment 30 tablet 1   memantine (NAMENDA XR) 14 MG CP24 24 hr capsule Take 14 mg by mouth at bedtime.      metoprolol  succinate (TOPROL-XL) 50 MG 24 hr tablet Take 25 mg by mouth every morning. Take with or immediately following a meal.      Multiple Vitamins-Minerals (SENTRY SENIOR PO) Take 1 tablet by mouth daily.     perphenazine (TRILAFON) 4 MG tablet Take 4 mg by mouth 2 (two) times daily.     lidocaine-prilocaine (EMLA) cream Apply 1 application topically as needed. 1 hour prior to  each chemotherapy treatment. Place small amount over port site and place saran wrap over the cream to protect clothing (Patient not taking: Reported on 06/04/2019) 30 g 0   ondansetron (ZOFRAN) 8 MG  tablet Take 1 tablet (8 mg total) by mouth 2 (two) times daily as needed for nausea or vomiting. (Patient not taking: Reported on 06/04/2019) 20 tablet 0   prochlorperazine (COMPAZINE) 10 MG tablet Take 1 tablet (10 mg total) by mouth every 6 (six) hours as needed for nausea or vomiting. (Patient not taking: Reported on 06/04/2019) 30 tablet 0   trifluridine-tipiracil (LONSURF) 20-8.19 MG tablet Take 3 tablets (60 mg of trifluridine total) by mouth 2 (two) times daily after a meal. Take on days 1-5,8-12. Repeat every 28days (Patient not taking: Reported on 06/04/2019) 60 tablet 0   No current facility-administered medications for this visit.    Facility-Administered Medications Ordered in Other Visits  Medication Dose Route Frequency Provider Last Rate Last Dose   heparin lock flush 100 unit/mL  500 Units Intravenous Once Sindy Guadeloupe, MD       heparin lock flush 100 unit/mL  500 Units Intravenous Once Sindy Guadeloupe, MD       sodium chloride flush (NS) 0.9 % injection 10 mL  10 mL Intravenous PRN Sindy Guadeloupe, MD   10 mL at 02/06/18 0845   sodium chloride flush (NS) 0.9 % injection 10 mL  10 mL Intravenous PRN Sindy Guadeloupe, MD   10 mL at 03/27/18 0909    Review of Systems  Constitutional: Positive for malaise/fatigue and weight loss (1lb). Negative for chills, diaphoresis and fever.  HENT: Negative.  Negative for congestion, ear pain, hearing loss, nosebleeds, sinus pain and sore throat.   Eyes: Negative.  Negative for blurred vision, double vision, photophobia and pain.  Respiratory: Negative.  Negative for cough, sputum production, shortness of breath and wheezing.   Cardiovascular: Negative.  Negative for chest pain, palpitations, orthopnea, leg swelling and PND.       H/o asymptomatic bradycardia.    Gastrointestinal: Positive for abdominal pain (stomach, Tylenol 1x nightly), constipation, diarrhea (secondary to relief of constipation) and vomiting (1 episode when eating too much,  resolved). Negative for blood in stool, melena and nausea.  Genitourinary: Negative.  Negative for dysuria, frequency, hematuria and urgency.  Musculoskeletal: Negative.  Negative for back pain, falls, joint pain, myalgias and neck pain.  Skin: Negative.  Negative for itching and rash.  Neurological: Negative.  Negative for dizziness, tingling, sensory change, speech change, focal weakness, weakness and headaches.  Endo/Heme/Allergies: Negative.  Does not bruise/bleed easily.  Psychiatric/Behavioral: Positive for memory loss. Negative for depression and substance abuse. The patient is nervous/anxious. The patient does not have insomnia.        Schizophrenia  All other systems reviewed and are negative.  Performance status (ECOG): 2  Vitals Blood pressure 114/67, pulse 86, temperature 98.4 F (36.9 C), temperature source Tympanic, resp. rate 18, weight 154 lb 1.6 oz (69.9 kg), SpO2 100 %.   Physical Exam  Constitutional: She appears well-developed and  well-nourished. She appears distressed.  HENT:  Head: Normocephalic and atraumatic.  Mouth/Throat: Oropharynx is clear and moist. No oropharyngeal exudate.  Wearing a black cap and mask. Short thin graying hair.  No mouth sores.   Eyes: Pupils are equal, round, and reactive to light. Conjunctivae and EOM are normal. No scleral icterus.  Glasses.  Blue eyes.   Neck: Normal range of motion. Neck supple. No JVD present.  Cardiovascular: Normal rate, regular rhythm and normal heart sounds.  No murmur heard. Pulmonary/Chest: Effort normal and breath sounds normal. No respiratory distress. She has no wheezes. She has no rales. She exhibits no tenderness.  Abdominal: Soft. Bowel sounds are normal. She exhibits no distension and no mass. There is abdominal tenderness. There is no rebound and no guarding.  Musculoskeletal: Normal range of motion.        General: Edema (trace ankle) present.  Lymphadenopathy:    She has no cervical adenopathy.     She has no axillary adenopathy.       Right: No supraclavicular adenopathy present.       Left: No supraclavicular adenopathy present.  Neurological: She is alert.  Skin: Skin is warm and dry. No rash noted. She is not diaphoretic. No erythema. There is pallor.  Psychiatric: Her mood appears anxious. She is agitated. Cognition and memory are impaired.  Schizophrenia.  Nursing note and vitals reviewed.   Appointment on 06/04/2019  Component Date Value Ref Range Status   Sodium 06/04/2019 131* 135 - 145 mmol/L Final   Potassium 06/04/2019 3.8  3.5 - 5.1 mmol/L Final   Chloride 06/04/2019 99  98 - 111 mmol/L Final   CO2 06/04/2019 24  22 - 32 mmol/L Final   Glucose, Bld 06/04/2019 124* 70 - 99 mg/dL Final   BUN 06/04/2019 14  8 - 23 mg/dL Final   Creatinine, Ser 06/04/2019 0.53  0.44 - 1.00 mg/dL Final   Calcium 06/04/2019 8.8* 8.9 - 10.3 mg/dL Final   GFR calc non Af Amer 06/04/2019 >60  >60 mL/min Final   GFR calc Af Amer 06/04/2019 >60  >60 mL/min Final   Anion gap 06/04/2019 8  5 - 15 Final   Performed at [facility] [place], [place] [address]   WBC 06/04/2019 3.2* 4.0 - 10.5 K/uL Final   RBC 06/04/2019 2.16* 3.87 - 5.11 MIL/uL Final   Hemoglobin 06/04/2019 6.3* 12.0 - 15.0 g/dL Final   HCT 06/04/2019 19.8* 36.0 - 46.0 % Final   MCV 06/04/2019 91.7  80.0 - 100.0 fL Final   MCH 06/04/2019 29.2  26.0 - 34.0 pg Final   MCHC 06/04/2019 31.8  30.0 - 36.0 g/dL Final   RDW 06/04/2019 21.7* 11.5 - 15.5 % Final   Platelets 06/04/2019 126* 150 - 400 K/uL Final   Comment: Immature Platelet Fraction may be clinically indicated, consider ordering this additional test [id number]    nRBC 06/04/2019 0.0  0.0 - 0.2 % Final   Neutrophils Relative % 06/04/2019 72  % Final   Neutro Abs 06/04/2019 2.3  1.7 - 7.7 K/uL Final   Lymphocytes Relative 06/04/2019 23  % Final   Lymphs Abs 06/04/2019 0.7  0.7 - 4.0 K/uL Final   Monocytes Relative  06/04/2019 4  % Final   Monocytes Absolute 06/04/2019 0.1  0.1 - 1.0 K/uL Final   Eosinophils Relative 06/04/2019 0  % Final   Eosinophils Absolute 06/04/2019 0.0  0.0 - 0.5 K/uL Final   Basophils Relative 06/04/2019  0  % Final   Basophils Absolute 06/04/2019 0.0  0.0 - 0.1 K/uL Final   Immature Granulocytes 06/04/2019 1  % Final   Abs Immature Granulocytes 06/04/2019 0.03  0.00 - 0.07 K/uL Final   Performed at [facility], [address] [place], [place] [address]    Assessment:  Leolia Vinzant is a 73 y.o. female with stage IV colon cancerwith peritoneal metastasis and possible liver metastasis. She is s/p exploratory laparotomy with right hemicolectomy and terminal ileum resection and separate small bowel resection on 12/06/2017. 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 Radial margin was positive for invasive carcinoma. There were 2 colon adenomas (2.5 and 1.8 cm) of the ascending colon. Metastatic adenocarcinoma was seen in 4 of 13 lymph nodes. There were 2 tumor deposits. There was a segment of small intestine with abscess which also showed adenocarcinoma invading the distal appendix with perforation. One mesenteric lymph node was negative for malignancy. Retroperitoneal tumor removal was positive for mucinous adenocarcinoma. Proximal and distal margins were negative. Pathologic stagewas [id number]. MSI stable. KRASmutation was positive  She received 5 cycles of FOLFOX(01/10/2018 - 03/27/2018). She began Neulasta with cycle #2. Oxaliplatin was decreased from 85 mg/m2 to 65 mg/m2 with cycle #4. She received 6 cycles of5FU + LV with Avastin(05/01/2018 - 07/17/2018).  She received 10 cycles of FOLIFIRI + Avastin(07/31/2018 - 02/13/2019). She receives chemotherapy every 3 weeks secondary to tolerance. Irinotecan was decreased from 150 mg/m2 to 125 mg/m2 with cycle #2. 5FU bolus was  discontinued with cycle #6.  Chest, abdomen, and pelvic CTon 03/02/2019 revealed progressive disease within the abdomen and pelvis. There was increase in soft tissue thickening along the surgical site at the ileocolic junction (2.9 x 1.9 cm to 4.6 x 2.5 cm). There was new and progressive peritoneal metastasis (2.1 x 2.4 cm). There was no bowel obstruction or other acute complication. There was enlarging soft tissue metastasis on the right pelvic oblique musculature (1.1 cm to 1.7 cm).  She isday15ofcycle #3Lonsurf (03/14/2019- 05/21/2019).Cycle #2 was notable for pancytopenia. Shehad grade III anemia andrequired 1 unit of PRBCs on 05/04/2019.  CEAhas been followed: 4.8 on 12/04/2017, 3.1 on 01/10/2018, 4.2 on 03/06/2018, 3.8 on 05/01/2018, 9.2 on 07/31/2018, 5.9 on 09/18/2018, 5.0 on 10/30/2018, 5.0 on 02/13/2019, 7.5 on 03/13/2019, 8.1 on 04/12/2019, 9.0 on 05/07/2019, and 10.7 on 05/17/2019.   She has macrocytic RBC indices. She has a history of alow B12 level. B12 was 266 on 01/30/2018 and 2448 on 01/22/2019. Folatewas 32 on 01/30/2018 and 45 on 01/22/2019. TSHwas 0.913 on 12/03/2017 and 1.627 on 01/22/2019.Ferritinwas 535 on 04/30/2019.  She has paranoid schizophrenia with delusions. She lives in a group home.  Symptomatically, she had constipation which when relieved resulted in diarrhea.  She has had emesis when eating too much at a time.  She is upset today about soiled clothes.  Exam is stable.  Hemoglobin is 6.3, platelets 126,000, white count 3200 with an ANC of 2300.  Plan: 1.  Labs today: CBC with diff,BMP, hold tube.  2.Stage IV colon cancer Sheis s/pFOLFOX then 5FU + LV + Avastin. She is s/pFOLFIRI + Avastin She isday15s/pcycle #3Lonsurf. Clinically, she appears to be doing fairly well except for pancytopenia due to chemotherapy  Diarrhea appears related to management of prior constipation.  Nausea and vomiting appear related to  overeating.  She has no mouth sores and no apparent chemotherapy-induced nausea or vomiting. Review plans for restaging CT scans after recovery  from this cycle. 3.Chemotherapy induced anemia Hemoglobin6.3. MCV 91.7.WBC3200 ([place] 2300). Platelets126,000. Ferritin was normal on 04/30/2019. B12 and folate were normal on 01/22/2019. Discuss continuation of Retacrit weekly.  As hemoglobin is < 7, schedule PRBC transfusion tomorrow.   Patient and Ms. [name] are in agreement.  Review fever and neutropenia precautions. 4.Hyponatremia             Sodium is 131.    Continue to encourage fluids with electrolytes and added salt.  5.   Retacrit today. 6.   RTC tomorrow for PRBC transfusion. 7.   RTC on 06/07/2019 for labs (CBC with diff, hold tube) and +/- transfusion of PRBCs on 07/17. 8.   RTC in 1 week for MD assessment, labs (CBC with diff, BMP, hold tube), and +/- Retacrit.  I discussed the assessment and treatment plan with the patient.  The patient was provided an opportunity to ask questions and all were answered.  The patient agreed with the plan and demonstrated an understanding of the instructions.  The patient was advised to call back if the symptoms worsen or if the condition fails to improve as anticipated.    Lequita Asal, MD, PhD    06/04/2019, 2:04 PM  I, Molly Dorshimer, am acting as Education administrator for [employer]. Mike Gip, MD, PhD.  I, Julis Haubner C. Mike Gip, MD, have reviewed the above documentation for accuracy and completeness, and I agree with the above.

## 2019-06-04 ENCOUNTER — Other Ambulatory Visit: Payer: Self-pay

## 2019-06-04 ENCOUNTER — Inpatient Hospital Stay: Payer: Medicare Other

## 2019-06-04 ENCOUNTER — Inpatient Hospital Stay (HOSPITAL_BASED_OUTPATIENT_CLINIC_OR_DEPARTMENT_OTHER): Payer: Medicare Other | Admitting: Hematology and Oncology

## 2019-06-04 VITALS — BP 114/67 | HR 86 | Temp 98.4°F | Resp 18 | Wt 154.1 lb

## 2019-06-04 DIAGNOSIS — C189 Malignant neoplasm of colon, unspecified: Secondary | ICD-10-CM

## 2019-06-04 DIAGNOSIS — C181 Malignant neoplasm of appendix: Secondary | ICD-10-CM | POA: Diagnosis not present

## 2019-06-04 DIAGNOSIS — Z7189 Other specified counseling: Secondary | ICD-10-CM

## 2019-06-04 DIAGNOSIS — D6481 Anemia due to antineoplastic chemotherapy: Secondary | ICD-10-CM

## 2019-06-04 DIAGNOSIS — D701 Agranulocytosis secondary to cancer chemotherapy: Secondary | ICD-10-CM

## 2019-06-04 DIAGNOSIS — C787 Secondary malignant neoplasm of liver and intrahepatic bile duct: Secondary | ICD-10-CM

## 2019-06-04 DIAGNOSIS — Z79899 Other long term (current) drug therapy: Secondary | ICD-10-CM

## 2019-06-04 DIAGNOSIS — Z87891 Personal history of nicotine dependence: Secondary | ICD-10-CM

## 2019-06-04 DIAGNOSIS — C786 Secondary malignant neoplasm of retroperitoneum and peritoneum: Secondary | ICD-10-CM | POA: Diagnosis not present

## 2019-06-04 DIAGNOSIS — E871 Hypo-osmolality and hyponatremia: Secondary | ICD-10-CM

## 2019-06-04 LAB — CBC WITH DIFFERENTIAL/PLATELET
Abs Immature Granulocytes: 0.03 10*3/uL (ref 0.00–0.07)
Basophils Absolute: 0 10*3/uL (ref 0.0–0.1)
Basophils Relative: 0 %
Eosinophils Absolute: 0 10*3/uL (ref 0.0–0.5)
Eosinophils Relative: 0 %
HCT: 19.8 % — ABNORMAL LOW (ref 36.0–46.0)
Hemoglobin: 6.3 g/dL — ABNORMAL LOW (ref 12.0–15.0)
Immature Granulocytes: 1 %
Lymphocytes Relative: 23 %
Lymphs Abs: 0.7 10*3/uL (ref 0.7–4.0)
MCH: 29.2 pg (ref 26.0–34.0)
MCHC: 31.8 g/dL (ref 30.0–36.0)
MCV: 91.7 fL (ref 80.0–100.0)
Monocytes Absolute: 0.1 10*3/uL (ref 0.1–1.0)
Monocytes Relative: 4 %
Neutro Abs: 2.3 10*3/uL (ref 1.7–7.7)
Neutrophils Relative %: 72 %
Platelets: 126 10*3/uL — ABNORMAL LOW (ref 150–400)
RBC: 2.16 MIL/uL — ABNORMAL LOW (ref 3.87–5.11)
RDW: 21.7 % — ABNORMAL HIGH (ref 11.5–15.5)
WBC: 3.2 10*3/uL — ABNORMAL LOW (ref 4.0–10.5)
nRBC: 0 % (ref 0.0–0.2)

## 2019-06-04 LAB — BASIC METABOLIC PANEL
Anion gap: 8 (ref 5–15)
BUN: 14 mg/dL (ref 8–23)
CO2: 24 mmol/L (ref 22–32)
Calcium: 8.8 mg/dL — ABNORMAL LOW (ref 8.9–10.3)
Chloride: 99 mmol/L (ref 98–111)
Creatinine, Ser: 0.53 mg/dL (ref 0.44–1.00)
GFR calc Af Amer: >60 mL/min (ref >60)
GFR calc non Af Amer: >60 mL/min (ref >60)
Glucose, Bld: 124 mg/dL — ABNORMAL HIGH (ref 70–99)
Potassium: 3.8 mmol/L (ref 3.5–5.1)
Sodium: 131 mmol/L — ABNORMAL LOW (ref 135–145)

## 2019-06-04 LAB — SAMPLE TO BLOOD BANK

## 2019-06-04 MED ORDER — EPOETIN ALFA-EPBX 10000 UNIT/ML IJ SOLN
10000.0000 [IU] | Freq: Once | INTRAMUSCULAR | Status: AC
  Administered 2019-06-04: 10000 [IU] via SUBCUTANEOUS

## 2019-06-04 NOTE — Progress Notes (Signed)
Pt here for follow up. Reports she last took [place] on Friday. States she vomited x 1 yesterday d/t "not chewing food good" and is now having diarrhea starting yesterday after not having BM x 4 days and then took Magnesium Citrate and had plenty of prune juice. Patient is pale today.

## 2019-06-05 ENCOUNTER — Other Ambulatory Visit: Payer: Self-pay

## 2019-06-05 ENCOUNTER — Telehealth: Payer: Self-pay | Admitting: *Deleted

## 2019-06-05 ENCOUNTER — Inpatient Hospital Stay: Payer: Medicare Other

## 2019-06-05 DIAGNOSIS — D6481 Anemia due to antineoplastic chemotherapy: Secondary | ICD-10-CM

## 2019-06-05 DIAGNOSIS — Z9289 Personal history of other medical treatment: Secondary | ICD-10-CM

## 2019-06-05 DIAGNOSIS — C181 Malignant neoplasm of appendix: Secondary | ICD-10-CM | POA: Diagnosis not present

## 2019-06-05 LAB — DAT, POLYSPECIFIC AHG (ARMC ONLY): Polyspecific AHG test: NEGATIVE

## 2019-06-05 LAB — PREPARE RBC (CROSSMATCH)

## 2019-06-05 MED ORDER — HEPARIN SOD (PORK) LOCK FLUSH 100 UNIT/ML IV SOLN
500.0000 [IU] | Freq: Every day | INTRAVENOUS | Status: AC | PRN
  Administered 2019-06-05: 500 [IU]

## 2019-06-05 MED ORDER — SODIUM CHLORIDE 0.9% FLUSH
10.0000 mL | INTRAVENOUS | Status: AC | PRN
  Administered 2019-06-05: 10:00:00 10 mL
  Filled 2019-06-05: qty 10

## 2019-06-05 MED ORDER — SODIUM CHLORIDE 0.9% IV SOLUTION
250.0000 mL | Freq: Once | INTRAVENOUS | Status: AC
  Administered 2019-06-05: 10:00:00 250 mL via INTRAVENOUS
  Filled 2019-06-05: qty 250

## 2019-06-05 MED ORDER — DIPHENHYDRAMINE HCL 25 MG PO CAPS
25.0000 mg | ORAL_CAPSULE | Freq: Once | ORAL | Status: AC
  Administered 2019-06-05: 10:00:00 25 mg via ORAL

## 2019-06-05 MED ORDER — ACETAMINOPHEN 325 MG PO TABS
650.0000 mg | ORAL_TABLET | Freq: Once | ORAL | Status: AC
  Administered 2019-06-05: 10:00:00 650 mg via ORAL

## 2019-06-05 NOTE — Telephone Encounter (Signed)
Patient is positive for antibodies on her type and crossmatch

## 2019-06-05 NOTE — Patient Instructions (Signed)
Blood Transfusion, Adult, Care After This sheet gives you information about how to care for yourself after your procedure. Your doctor may also give you more specific instructions. If you have problems or questions, contact your doctor. Follow these instructions at home:   Take over-the-counter and prescription medicines only as told by your doctor.  Go back to your normal activities as told by your doctor.  Follow instructions from your doctor about how to take care of the area where an IV tube was put into your vein (insertion site). Make sure you: ? Wash your hands with soap and water before you change your bandage (dressing). If there is no soap and water, use hand sanitizer. ? Change your bandage as told by your doctor.  Check your IV insertion site every day for signs of infection. Check for: ? More redness, swelling, or pain. ? More fluid or blood. ? Warmth. ? Pus or a bad smell. Contact a doctor if:  You have more redness, swelling, or pain around the IV insertion site.  You have more fluid or blood coming from the IV insertion site.  Your IV insertion site feels warm to the touch.  You have pus or a bad smell coming from the IV insertion site.  Your pee (urine) turns pink, red, or brown.  You feel weak after doing your normal activities. Get help right away if:  You have signs of a serious allergic or body defense (immune) system reaction, including: ? Itchiness. ? Hives. ? Trouble breathing. ? Anxiety. ? Pain in your chest or lower back. ? Fever, flushing, and chills. ? Fast pulse. ? Rash. ? Watery poop (diarrhea). ? Throwing up (vomiting). ? Dark pee. ? Serious headache. ? Dizziness. ? Stiff neck. ? Yellow color in your face or the white parts of your eyes (jaundice). Summary  After a blood transfusion, return to your normal activities as told by your doctor.  Every day, check for signs of infection where the IV tube was put into your vein.  Some  signs of infection are warm skin, more redness and pain, more fluid or blood, and pus or a bad smell where the needle went in.  Contact your doctor if you feel weak or have any unusual symptoms. This information is not intended to replace advice given to you by your health care provider. Make sure you discuss any questions you have with your health care provider. Document Released: 11/29/2014 Document Revised: 03/15/2018 Document Reviewed: 07/02/2016 Elsevier Patient Education  2020 [employer]  

## 2019-06-06 ENCOUNTER — Other Ambulatory Visit: Payer: Self-pay

## 2019-06-06 LAB — BPAM RBC
Blood Product Expiration Date: 202007262359
ISSUE DATE / TIME: 202007141053
Unit Type and Rh: 1700

## 2019-06-06 LAB — TYPE AND SCREEN
ABO/RH(D): AB POS
Antibody Screen: POSITIVE
Donor AG Type: NEGATIVE
Unit division: 0

## 2019-06-07 ENCOUNTER — Other Ambulatory Visit: Payer: Self-pay

## 2019-06-07 ENCOUNTER — Inpatient Hospital Stay: Payer: Medicare Other

## 2019-06-07 ENCOUNTER — Other Ambulatory Visit: Payer: Self-pay | Admitting: Hematology and Oncology

## 2019-06-07 ENCOUNTER — Telehealth: Payer: Self-pay

## 2019-06-07 ENCOUNTER — Telehealth: Payer: Self-pay | Admitting: *Deleted

## 2019-06-07 DIAGNOSIS — C181 Malignant neoplasm of appendix: Secondary | ICD-10-CM | POA: Diagnosis not present

## 2019-06-07 DIAGNOSIS — D6481 Anemia due to antineoplastic chemotherapy: Secondary | ICD-10-CM

## 2019-06-07 DIAGNOSIS — T451X5A Adverse effect of antineoplastic and immunosuppressive drugs, initial encounter: Secondary | ICD-10-CM

## 2019-06-07 DIAGNOSIS — C189 Malignant neoplasm of colon, unspecified: Secondary | ICD-10-CM

## 2019-06-07 LAB — CBC WITH DIFFERENTIAL/PLATELET
Abs Immature Granulocytes: 0.02 10*3/uL (ref 0.00–0.07)
Basophils Absolute: 0 10*3/uL (ref 0.0–0.1)
Basophils Relative: 1 %
Eosinophils Absolute: 0 10*3/uL (ref 0.0–0.5)
Eosinophils Relative: 1 %
HCT: 20.4 % — ABNORMAL LOW (ref 36.0–46.0)
Hemoglobin: 6.7 g/dL — ABNORMAL LOW (ref 12.0–15.0)
Immature Granulocytes: 1 %
Lymphocytes Relative: 40 %
Lymphs Abs: 0.8 10*3/uL (ref 0.7–4.0)
MCH: 29.8 pg (ref 26.0–34.0)
MCHC: 32.8 g/dL (ref 30.0–36.0)
MCV: 90.7 fL (ref 80.0–100.0)
Monocytes Absolute: 0.1 10*3/uL (ref 0.1–1.0)
Monocytes Relative: 7 %
Neutro Abs: 1 10*3/uL — ABNORMAL LOW (ref 1.7–7.7)
Neutrophils Relative %: 50 %
Platelets: 118 10*3/uL — ABNORMAL LOW (ref 150–400)
RBC: 2.25 MIL/uL — ABNORMAL LOW (ref 3.87–5.11)
RDW: 21.1 % — ABNORMAL HIGH (ref 11.5–15.5)
WBC: 2 10*3/uL — ABNORMAL LOW (ref 4.0–10.5)
nRBC: 0 % (ref 0.0–0.2)

## 2019-06-07 LAB — SAMPLE TO BLOOD BANK

## 2019-06-07 LAB — PREPARE RBC (CROSSMATCH)

## 2019-06-07 NOTE — Telephone Encounter (Signed)
Patient here for lab only; to determine if she needs another unit of PRBC's tomorrow. Patient was incontinent of stool after lunch and has gotten herself upset and worked up according to [place], her caregiver. VSS BP 144/72 HR 102 SPo2 98% R 21. Hemoglobin today is 6.7. Confirmed with CG [name] for an appointment for blood transfusion at 9:30 tomorrow morning. Patient has another appt tomorrow afternoon at 2P with psychiatry.

## 2019-06-07 NOTE — Telephone Encounter (Signed)
Informed caregiver, [name], about results and educated on neutropenic precautions. [name] verbalizes understanding and denies any further questions or concerns.

## 2019-06-07 NOTE — Telephone Encounter (Signed)
-----   Message from Lequita Asal, MD sent at 06/07/2019  2:50 PM EDT ----- Regarding: Please call patient/Mrs Latoya Jones  Please review neutropenic precautions.  M ----- Message ----- From: Buel Ream, Lab In [place] Sent: 06/07/2019   1:53 PM EDT To: Lequita Asal, MD

## 2019-06-08 ENCOUNTER — Inpatient Hospital Stay: Payer: Medicare Other

## 2019-06-08 ENCOUNTER — Other Ambulatory Visit: Payer: Self-pay

## 2019-06-08 DIAGNOSIS — C181 Malignant neoplasm of appendix: Secondary | ICD-10-CM | POA: Diagnosis not present

## 2019-06-08 DIAGNOSIS — T451X5A Adverse effect of antineoplastic and immunosuppressive drugs, initial encounter: Secondary | ICD-10-CM

## 2019-06-08 DIAGNOSIS — D6481 Anemia due to antineoplastic chemotherapy: Secondary | ICD-10-CM

## 2019-06-08 MED ORDER — HEPARIN SOD (PORK) LOCK FLUSH 100 UNIT/ML IV SOLN
250.0000 [IU] | INTRAVENOUS | Status: AC | PRN
  Administered 2019-06-08: 13:00:00 500 [IU]

## 2019-06-08 MED ORDER — ACETAMINOPHEN 325 MG PO TABS
650.0000 mg | ORAL_TABLET | Freq: Once | ORAL | Status: AC
  Administered 2019-06-08: 10:00:00 650 mg via ORAL

## 2019-06-08 MED ORDER — SODIUM CHLORIDE 0.9% IV SOLUTION
250.0000 mL | Freq: Once | INTRAVENOUS | Status: AC
  Administered 2019-06-08: 250 mL via INTRAVENOUS
  Filled 2019-06-08: qty 250

## 2019-06-08 MED ORDER — SODIUM CHLORIDE 0.9% FLUSH
10.0000 mL | INTRAVENOUS | Status: AC | PRN
  Administered 2019-06-08: 10:00:00 10 mL
  Filled 2019-06-08: qty 10

## 2019-06-08 MED ORDER — DIPHENHYDRAMINE HCL 25 MG PO CAPS
25.0000 mg | ORAL_CAPSULE | Freq: Once | ORAL | Status: AC
  Administered 2019-06-08: 10:00:00 25 mg via ORAL

## 2019-06-08 NOTE — Patient Instructions (Signed)
Blood Transfusion, Adult, Care After This sheet gives you information about how to care for yourself after your procedure. Your doctor may also give you more specific instructions. If you have problems or questions, contact your doctor. Follow these instructions at home:   Take over-the-counter and prescription medicines only as told by your doctor.  Go back to your normal activities as told by your doctor.  Follow instructions from your doctor about how to take care of the area where an IV tube was put into your vein (insertion site). Make sure you: ? Wash your hands with soap and water before you change your bandage (dressing). If there is no soap and water, use hand sanitizer. ? Change your bandage as told by your doctor.  Check your IV insertion site every day for signs of infection. Check for: ? More redness, swelling, or pain. ? More fluid or blood. ? Warmth. ? Pus or a bad smell. Contact a doctor if:  You have more redness, swelling, or pain around the IV insertion site.  You have more fluid or blood coming from the IV insertion site.  Your IV insertion site feels warm to the touch.  You have pus or a bad smell coming from the IV insertion site.  Your pee (urine) turns pink, red, or brown.  You feel weak after doing your normal activities. Get help right away if:  You have signs of a serious allergic or body defense (immune) system reaction, including: ? Itchiness. ? Hives. ? Trouble breathing. ? Anxiety. ? Pain in your chest or lower back. ? Fever, flushing, and chills. ? Fast pulse. ? Rash. ? Watery poop (diarrhea). ? Throwing up (vomiting). ? Dark pee. ? Serious headache. ? Dizziness. ? Stiff neck. ? Yellow color in your face or the white parts of your eyes (jaundice). Summary  After a blood transfusion, return to your normal activities as told by your doctor.  Every day, check for signs of infection where the IV tube was put into your vein.  Some  signs of infection are warm skin, more redness and pain, more fluid or blood, and pus or a bad smell where the needle went in.  Contact your doctor if you feel weak or have any unusual symptoms. This information is not intended to replace advice given to you by your health care provider. Make sure you discuss any questions you have with your health care provider. Document Released: 11/29/2014 Document Revised: 03/15/2018 Document Reviewed: 07/02/2016 Elsevier Patient Education  2020 [employer]  

## 2019-06-09 LAB — TYPE AND SCREEN
ABO/RH(D): AB POS
Antibody Screen: POSITIVE
Donor AG Type: NEGATIVE
Unit division: 0

## 2019-06-09 LAB — BPAM RBC
Blood Product Expiration Date: 202008042359
ISSUE DATE / TIME: 202007171030
Unit Type and Rh: 1700

## 2019-06-10 NOTE — Progress Notes (Signed)
[facility]  [address], [address] [place], [place] [address] Phone: [phone]  Fax: [phone]   Clinic Day:  06/11/2019  Referring physician: Gae Bon, NP  Chief Complaint: Latoya Jones is a 73 y.o. female withstage IV colon cancer who is seen forassessmenton day22of cycle#3Lonsurf.  HPI: The patient was last seen in the medical oncology clinic on 06/04/2019. At that time, she had constipation which when relieved resulted in diarrhea. She had emesis when eating too much at a time. She was upset about soiled clothes. Exam was stable. Hemoglobin 6.3, platelets 126,000, white count 3200 with an ANC of 2300.  Labs on 06/07/2019: WBC 2,000, ANC 1,000, hemoglobin 6.7, hematocrit 20.4, platelets 118,000.   She received 1 unit PRBCs on 06/05/2019 and 06/08/2019.   During the interim, she is doing "so-so." She denies any diarrhea or vomiting but notes nausea. She has been active and eating well, although she missed a meal yesterday because she was taking a nap.  Weight is stable.  She reports she is cold and has rhinorrhea.  She notes occasionally passing a loose BM. She takes preventative Tylenol nightly for abdominal pain.   [name] notes Brooks has been doing better this week, although she has been more tired.    Past Medical History:  Diagnosis Date   Colon cancer ([place])    Hypertension     Past Surgical History:  Procedure Laterality Date   COLOSTOMY REVISION N/A 12/06/2017   Procedure: COLON RESECTION RIGHT;  Surgeon: Florene Glen, MD;  Location: [facility] ORS;  Service: General;  Laterality: N/A;   DILATION AND CURETTAGE, DIAGNOSTIC / THERAPEUTIC     PORTACATH PLACEMENT N/A 01/06/2018   Procedure: INSERTION PORT-A-CATH;  Surgeon: Vickie Epley, MD;  Location: [facility] ORS;  Service: General;  Laterality: N/A;    Family History  Problem Relation Age of Onset   ALS Mother    ALS Father     Social History:  reports that  she quit smoking about 11 years ago. She has never used smokeless tobacco. She reports that she does not drink alcohol or use drugs.She lives in a family care home; generally accompanied by Latoya Jones. She is alonein clinictoday.I spoke with Latoya Jones on the phone (she was in the parking lot)[phone].  Allergies: No Known Allergies  Current Medications: Current Outpatient Medications  Medication Sig Dispense Refill   acetaminophen (TYLENOL) 500 MG tablet Take 1,000 mg by mouth every 8 (eight) hours as needed for mild pain.     alendronate (FOSAMAX) 70 MG tablet Take 70 mg by mouth once a week.     amLODipine (NORVASC) 10 MG tablet Take 10 mg by mouth daily.      ARIPiprazole (ABILIFY) 15 MG tablet Take 7.5 mg by mouth daily.     atorvastatin (LIPITOR) 20 MG tablet Take 1 tablet by mouth daily.     calcium carbonate (TUMS - DOSED IN MG ELEMENTAL CALCIUM) 500 MG chewable tablet Chew 1 tablet by mouth 2 (two) times daily as needed for indigestion or heartburn.     Calcium Carbonate-Vitamin D3 (CALCIUM 600-D) 600-400 MG-UNIT TABS Take 1 tablet by mouth daily.     loratadine (CLARITIN) 10 MG tablet Take 1 tablet (10 mg total) by mouth daily as needed for allergies. Take 1 tablet the day before pump removal, 1 tablet the day of pump removal and 1 tablet the day after pump removal. Take as directed with each cycle of  chemotherapy treatment 30 tablet  1   memantine (NAMENDA XR) 14 MG CP24 24 hr capsule Take 14 mg by mouth at bedtime.      metoprolol succinate (TOPROL-XL) 50 MG 24 hr tablet Take 25 mg by mouth every morning. Take with or immediately following a meal.      Multiple Vitamins-Minerals (SENTRY SENIOR PO) Take 1 tablet by mouth daily.     perphenazine (TRILAFON) 4 MG tablet Take 4 mg by mouth 2 (two) times daily.     lidocaine-prilocaine (EMLA) cream Apply 1 application topically as needed. 1 hour prior to  each chemotherapy treatment. Place small amount over port site and  place saran wrap over the cream to protect clothing (Patient not taking: Reported on 06/04/2019) 30 g 0   ondansetron (ZOFRAN) 8 MG tablet Take 1 tablet (8 mg total) by mouth 2 (two) times daily as needed for nausea or vomiting. (Patient not taking: Reported on 06/04/2019) 20 tablet 0   prochlorperazine (COMPAZINE) 10 MG tablet Take 1 tablet (10 mg total) by mouth every 6 (six) hours as needed for nausea or vomiting. (Patient not taking: Reported on 06/04/2019) 30 tablet 0   trifluridine-tipiracil (LONSURF) 20-8.19 MG tablet Take 3 tablets (60 mg of trifluridine total) by mouth 2 (two) times daily after a meal. Take on days 1-5,8-12. Repeat every 28days (Patient not taking: Reported on 06/11/2019) 60 tablet 0   No current facility-administered medications for this visit.    Facility-Administered Medications Ordered in Other Visits  Medication Dose Route Frequency Provider Last Rate Last Dose   heparin lock flush 100 unit/mL  500 Units Intravenous Once Sindy Guadeloupe, MD       heparin lock flush 100 unit/mL  500 Units Intravenous Once Sindy Guadeloupe, MD       sodium chloride flush (NS) 0.9 % injection 10 mL  10 mL Intravenous PRN Sindy Guadeloupe, MD   10 mL at 02/06/18 0845   sodium chloride flush (NS) 0.9 % injection 10 mL  10 mL Intravenous PRN Sindy Guadeloupe, MD   10 mL at 03/27/18 6433    Review of Systems  Constitutional: Positive for malaise/fatigue. Negative for chills, diaphoresis, fever and weight loss (stable).       Feels "so-so".  HENT: Negative.  Negative for congestion, ear pain, hearing loss, nosebleeds, sinus pain and sore throat.        Rhinorrhea  Eyes: Negative.  Negative for blurred vision, double vision, photophobia and pain.  Respiratory: Negative.  Negative for cough, sputum production, shortness of breath and wheezing.   Cardiovascular: Negative.  Negative for chest pain, palpitations, orthopnea, leg swelling and PND.       H/o asymptomatic bradycardia.      Gastrointestinal: Positive for abdominal pain (stomach, Tylenol 1x nightly), constipation and nausea. Negative for blood in stool, diarrhea (none), melena and vomiting.  Genitourinary: Negative.  Negative for dysuria, frequency, hematuria and urgency.  Musculoskeletal: Negative.  Negative for back pain, falls, joint pain, myalgias and neck pain.  Skin: Negative.  Negative for itching and rash.  Neurological: Negative.  Negative for dizziness, tingling, sensory change, speech change, focal weakness, weakness and headaches.  Endo/Heme/Allergies: Negative.  Does not bruise/bleed easily.  Psychiatric/Behavioral: Positive for memory loss. Negative for depression and substance abuse. The patient is not nervous/anxious and does not have insomnia.        Schizophrenia  All other systems reviewed and are negative.  Performance status (ECOG): 2  Vitals Blood pressure 128/77, pulse 81, temperature 98.2  F (36.8 C), temperature source Tympanic, resp. rate 18, weight 154 lb 8.7 oz (70.1 kg), SpO2 100 %.   Physical Exam  Constitutional: She is oriented to person, place, and time. She appears well-developed and well-nourished. No distress.  HENT:  Head: Normocephalic and atraumatic.  Mouth/Throat: Oropharynx is clear and moist. No oropharyngeal exudate.  Wearing a sparkly hat and mask. Short thinning gray hair.   Eyes: Pupils are equal, round, and reactive to light. Conjunctivae and EOM are normal. No scleral icterus.  Glasses.  Blue eyes.   Neck: Normal range of motion. Neck supple. No JVD present.  Cardiovascular: Normal rate, regular rhythm and normal heart sounds.  No murmur heard. Pulmonary/Chest: Effort normal and breath sounds normal. No respiratory distress. She has no wheezes. She has no rales. She exhibits no tenderness.  Abdominal: Soft. Bowel sounds are normal. She exhibits no distension and no mass. There is no abdominal tenderness. There is no rebound and no guarding.  Musculoskeletal:  Normal range of motion.        General: No edema.  Lymphadenopathy:    She has no cervical adenopathy.    She has no axillary adenopathy.       Right: No supraclavicular adenopathy present.       Left: No supraclavicular adenopathy present.  Neurological: She is alert and oriented to person, place, and time.  Skin: Skin is warm and dry. No rash noted. She is not diaphoretic. No erythema. There is pallor.  Psychiatric: Cognition and memory are impaired.  Schizophrenia.  Nursing note and vitals reviewed.   Appointment on 06/11/2019  Component Date Value Ref Range Status   Sodium 06/11/2019 133* 135 - 145 mmol/L Final   Potassium 06/11/2019 3.7  3.5 - 5.1 mmol/L Final   Chloride 06/11/2019 101  98 - 111 mmol/L Final   CO2 06/11/2019 24  22 - 32 mmol/L Final   Glucose, Bld 06/11/2019 124* 70 - 99 mg/dL Final   BUN 06/11/2019 12  8 - 23 mg/dL Final   Creatinine, Ser 06/11/2019 0.51  0.44 - 1.00 mg/dL Final   Calcium 06/11/2019 9.1  8.9 - 10.3 mg/dL Final   GFR calc non Af Amer 06/11/2019 >60  >60 mL/min Final   GFR calc Af Amer 06/11/2019 >60  >60 mL/min Final   Anion gap 06/11/2019 8  5 - 15 Final   Performed at [facility], [place], [address] [place], [place] [address]   WBC 06/11/2019 1.9* 4.0 - 10.5 K/uL Final   RBC 06/11/2019 2.84* 3.87 - 5.11 MIL/uL Final   Hemoglobin 06/11/2019 8.4* 12.0 - 15.0 g/dL Final   HCT 06/11/2019 26.4* 36.0 - 46.0 % Final   MCV 06/11/2019 93.0  80.0 - 100.0 fL Final   MCH 06/11/2019 29.6  26.0 - 34.0 pg Final   MCHC 06/11/2019 31.8  30.0 - 36.0 g/dL Final   RDW 06/11/2019 20.6* 11.5 - 15.5 % Final   Platelets 06/11/2019 129* 150 - 400 K/uL Final   nRBC 06/11/2019 0.0  0.0 - 0.2 % Final   Neutrophils Relative % 06/11/2019 35  % Final   Neutro Abs 06/11/2019 0.7* 1.7 - 7.7 K/uL Final   Lymphocytes Relative 06/11/2019 45  % Final   Lymphs Abs 06/11/2019 0.9  0.7 - 4.0 K/uL Final   Monocytes Relative 06/11/2019 18   % Final   Monocytes Absolute 06/11/2019 0.4  0.1 - 1.0 K/uL Final   Eosinophils Relative 06/11/2019 1  % Final   Eosinophils  Absolute 06/11/2019 0.0  0.0 - 0.5 K/uL Final   Basophils Relative 06/11/2019 1  % Final   Basophils Absolute 06/11/2019 0.0  0.0 - 0.1 K/uL Final   Immature Granulocytes 06/11/2019 0  % Final   Abs Immature Granulocytes 06/11/2019 0.00  0.00 - 0.07 K/uL Final   Performed at [facility], [address] [place], [place] [address]    Assessment:  Shavonne Ambroise is a 73 y.o. female with stage IV colon cancerwith peritoneal metastasis and possible liver metastasis. She is s/p exploratory laparotomy with right hemicolectomy and terminal ileum resection and separate small bowel resection on 12/06/2017. 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 Radial margin was positive for invasive carcinoma. There were 2 colon adenomas (2.5 and 1.8 cm) of the ascending colon. Metastatic adenocarcinoma was seen in 4 of 13 lymph nodes. There were 2 tumor deposits. There was a segment of small intestine with abscess which also showed adenocarcinoma invading the distal appendix with perforation. One mesenteric lymph node was negative for malignancy. Retroperitoneal tumor removal was positive for mucinous adenocarcinoma. Proximal and distal margins were negative. Pathologic stagewas [id number]. MSI stable. KRASmutation was positive  She received 5 cycles of FOLFOX(01/10/2018 - 03/27/2018). She began Neulasta with cycle #2. Oxaliplatin was decreased from 85 mg/m2 to 65 mg/m2 with cycle #4. She received 6 cycles of5FU + LV with Avastin(05/01/2018 - 07/17/2018).  She received 10 cycles of FOLIFIRI + Avastin(07/31/2018 - 02/13/2019). She receives chemotherapy every 3 weeks secondary to tolerance. Irinotecan was decreased from 150 mg/m2 to 125 mg/m2 with cycle #2. 5FU bolus was discontinued with  cycle #6.  Chest, abdomen, and pelvic CTon 03/02/2019 revealed progressive disease within the abdomen and pelvis. There was increase in soft tissue thickening along the surgical site at the ileocolic junction (2.9 x 1.9 cm to 4.6 x 2.5 cm). There was new and progressive peritoneal metastasis (2.1 x 2.4 cm). There was no bowel obstruction or other acute complication. There was enlarging soft tissue metastasis on the right pelvic oblique musculature (1.1 cm to 1.7 cm).  She isday22ofcycle #3Lonsurf (03/14/2019- 05/21/2019).Cycle #2 was notable for pancytopenia. Shehad grade III anemia andrequired 1 unit of PRBCs on 05/04/2019, 06/05/2019, and 06/08/2019.  She receives Retacrit (last 06/04/2019).   CEAhas been followed: 4.8 on 12/04/2017, 3.1 on 01/10/2018, 4.2 on 03/06/2018, 3.8 on 05/01/2018, 9.2 on 07/31/2018, 5.9 on 09/18/2018, 5.0 on 10/30/2018, 5.0 on 02/13/2019, 7.5 on 03/13/2019, 8.1 on 04/12/2019, 9.0 on 05/07/2019, and 10.7 on 05/17/2019.   She has macrocytic RBC indices. She has a history of alow B12 level. B12 was 266 on 01/30/2018 and 2448 on 01/22/2019. Folatewas 32 on 01/30/2018 and 45 on 01/22/2019. TSHwas 0.913 on 12/03/2017 and 1.627 on 01/22/2019.Ferritinwas 535 on 04/30/2019.  She has paranoid schizophrenia with delusions. She lives in a group home.  Symptomatically,  She is eating better.  She notes some nausea, but no vomiting or diarrhea.  She denies any bleeding. Hemoglobin 8.4.  Platelets 129,000.  WBC 1900 ([place] 700).  Plan: 1.   Labs today: CBC with diff,BMP, hold tube.  2.Stage IV colon cancer Sheis s/pFOLFOX then 5FU + LV + Avastin. She is s/pFOLFIRI + Avastin She isday22s/pcycle #3Lonsurf. Clinically, she appears be recovering from cycle #3 Lonsurf .  Counts however remain low.             Review neutropenic precautions. Schedule restaging CT scans to assess response to therapy. 3.Chemotherapy induced  anemia Hemoglobin8.4. [id number] (  [place] 700). Platelets129,000. Ferritin was normal on 04/30/2019. B12 and folate were normal on 01/22/2019. Discuss continuation of Retacrit weekly.   No Retacrit today secondary to increase in hemoglobin (6.7 to 8.4).             Patient received a PRBC transfusion on 07/14 and 06/08/2019.             Review fever neutropenia precautions. 4.Hyponatremia Sodium is 133.               Continue to encourage fluids with electrolytes and added salt.  5.   RTC in 1 week for labs (CBC with differential, hold tube, BMP) and plus/minus retrograde 6.   Chest, abdomen and pelvic CT scan next week 7.   RTC on 06/21/2019 for MD assessment, review of imaging and discussion regarding direction of therapy.   I discussed the assessment and treatment plan with the patient.  The patient was provided an opportunity to ask questions and all were answered.  The patient agreed with the plan and demonstrated an understanding of the instructions.  The patient was advised to call back if the symptoms worsen or if the condition fails to improve as anticipated.  I provided 15 minutes of face-to-face time during this this encounter and > 50% was spent counseling as documented under my assessment and plan.    Lequita Asal, MD, PhD    06/11/2019, 2:09 PM  I, Molly Dorshimer, am acting as Education administrator for [employer]. Mike Gip, MD, PhD.  I, Malillany Kazlauskas C. Mike Gip, MD, have reviewed the above documentation for accuracy and completeness, and I agree with the above.

## 2019-06-11 ENCOUNTER — Inpatient Hospital Stay: Payer: Medicare Other

## 2019-06-11 ENCOUNTER — Encounter: Payer: Self-pay | Admitting: Hematology and Oncology

## 2019-06-11 ENCOUNTER — Other Ambulatory Visit: Payer: Self-pay

## 2019-06-11 ENCOUNTER — Inpatient Hospital Stay (HOSPITAL_BASED_OUTPATIENT_CLINIC_OR_DEPARTMENT_OTHER): Payer: Medicare Other | Admitting: Hematology and Oncology

## 2019-06-11 VITALS — BP 128/77 | HR 81 | Temp 98.2°F | Resp 18 | Wt 154.5 lb

## 2019-06-11 DIAGNOSIS — E871 Hypo-osmolality and hyponatremia: Secondary | ICD-10-CM

## 2019-06-11 DIAGNOSIS — D701 Agranulocytosis secondary to cancer chemotherapy: Secondary | ICD-10-CM

## 2019-06-11 DIAGNOSIS — C189 Malignant neoplasm of colon, unspecified: Secondary | ICD-10-CM | POA: Diagnosis not present

## 2019-06-11 DIAGNOSIS — Z7189 Other specified counseling: Secondary | ICD-10-CM

## 2019-06-11 DIAGNOSIS — C786 Secondary malignant neoplasm of retroperitoneum and peritoneum: Secondary | ICD-10-CM | POA: Diagnosis not present

## 2019-06-11 DIAGNOSIS — C181 Malignant neoplasm of appendix: Secondary | ICD-10-CM | POA: Diagnosis not present

## 2019-06-11 DIAGNOSIS — Z87891 Personal history of nicotine dependence: Secondary | ICD-10-CM

## 2019-06-11 DIAGNOSIS — Z79899 Other long term (current) drug therapy: Secondary | ICD-10-CM

## 2019-06-11 DIAGNOSIS — C787 Secondary malignant neoplasm of liver and intrahepatic bile duct: Secondary | ICD-10-CM

## 2019-06-11 DIAGNOSIS — D6481 Anemia due to antineoplastic chemotherapy: Secondary | ICD-10-CM

## 2019-06-11 LAB — CBC WITH DIFFERENTIAL/PLATELET
Abs Immature Granulocytes: 0 10*3/uL (ref 0.00–0.07)
Basophils Absolute: 0 10*3/uL (ref 0.0–0.1)
Basophils Relative: 1 %
Eosinophils Absolute: 0 10*3/uL (ref 0.0–0.5)
Eosinophils Relative: 1 %
HCT: 26.4 % — ABNORMAL LOW (ref 36.0–46.0)
Hemoglobin: 8.4 g/dL — ABNORMAL LOW (ref 12.0–15.0)
Immature Granulocytes: 0 %
Lymphocytes Relative: 45 %
Lymphs Abs: 0.9 10*3/uL (ref 0.7–4.0)
MCH: 29.6 pg (ref 26.0–34.0)
MCHC: 31.8 g/dL (ref 30.0–36.0)
MCV: 93 fL (ref 80.0–100.0)
Monocytes Absolute: 0.4 10*3/uL (ref 0.1–1.0)
Monocytes Relative: 18 %
Neutro Abs: 0.7 10*3/uL — ABNORMAL LOW (ref 1.7–7.7)
Neutrophils Relative %: 35 %
Platelets: 129 10*3/uL — ABNORMAL LOW (ref 150–400)
RBC: 2.84 MIL/uL — ABNORMAL LOW (ref 3.87–5.11)
RDW: 20.6 % — ABNORMAL HIGH (ref 11.5–15.5)
WBC: 1.9 10*3/uL — ABNORMAL LOW (ref 4.0–10.5)
nRBC: 0 % (ref 0.0–0.2)

## 2019-06-11 LAB — BASIC METABOLIC PANEL
Anion gap: 8 (ref 5–15)
BUN: 12 mg/dL (ref 8–23)
CO2: 24 mmol/L (ref 22–32)
Calcium: 9.1 mg/dL (ref 8.9–10.3)
Chloride: 101 mmol/L (ref 98–111)
Creatinine, Ser: 0.51 mg/dL (ref 0.44–1.00)
GFR calc Af Amer: >60 mL/min (ref >60)
GFR calc non Af Amer: >60 mL/min (ref >60)
Glucose, Bld: 124 mg/dL — ABNORMAL HIGH (ref 70–99)
Potassium: 3.7 mmol/L (ref 3.5–5.1)
Sodium: 133 mmol/L — ABNORMAL LOW (ref 135–145)

## 2019-06-11 LAB — SAMPLE TO BLOOD BANK

## 2019-06-11 NOTE — Progress Notes (Signed)
Pt here for follow up. Denies any concerns.  

## 2019-06-14 ENCOUNTER — Other Ambulatory Visit: Payer: Self-pay

## 2019-06-14 ENCOUNTER — Ambulatory Visit
Admission: RE | Admit: 2019-06-14 | Discharge: 2019-06-14 | Disposition: A | Discharge status: Home / Self Care | Payer: Medicare Other | Source: Ambulatory Visit | Attending: Hematology and Oncology | Admitting: Hematology and Oncology

## 2019-06-14 DIAGNOSIS — C787 Secondary malignant neoplasm of liver and intrahepatic bile duct: Secondary | ICD-10-CM | POA: Insufficient documentation

## 2019-06-14 DIAGNOSIS — C189 Malignant neoplasm of colon, unspecified: Secondary | ICD-10-CM | POA: Diagnosis present

## 2019-06-14 MED ORDER — IOHEXOL 300 MG/ML  SOLN
100.0000 mL | Freq: Once | INTRAMUSCULAR | Status: AC | PRN
  Administered 2019-06-14: 100 mL via INTRAVENOUS

## 2019-06-15 ENCOUNTER — Other Ambulatory Visit: Payer: Self-pay

## 2019-06-15 ENCOUNTER — Inpatient Hospital Stay: Payer: Medicare Other

## 2019-06-15 ENCOUNTER — Inpatient Hospital Stay (HOSPITAL_BASED_OUTPATIENT_CLINIC_OR_DEPARTMENT_OTHER): Payer: Medicare Other | Admitting: Hematology and Oncology

## 2019-06-15 ENCOUNTER — Encounter: Payer: Self-pay | Admitting: Hematology and Oncology

## 2019-06-15 VITALS — BP 131/56 | HR 73 | Temp 97.2°F | Resp 18 | Ht 65.0 in | Wt 155.4 lb

## 2019-06-15 DIAGNOSIS — C786 Secondary malignant neoplasm of retroperitoneum and peritoneum: Secondary | ICD-10-CM

## 2019-06-15 DIAGNOSIS — C189 Malignant neoplasm of colon, unspecified: Secondary | ICD-10-CM

## 2019-06-15 DIAGNOSIS — Z79899 Other long term (current) drug therapy: Secondary | ICD-10-CM

## 2019-06-15 DIAGNOSIS — Z87891 Personal history of nicotine dependence: Secondary | ICD-10-CM

## 2019-06-15 DIAGNOSIS — D6481 Anemia due to antineoplastic chemotherapy: Secondary | ICD-10-CM

## 2019-06-15 DIAGNOSIS — D701 Agranulocytosis secondary to cancer chemotherapy: Secondary | ICD-10-CM

## 2019-06-15 DIAGNOSIS — C181 Malignant neoplasm of appendix: Secondary | ICD-10-CM | POA: Diagnosis not present

## 2019-06-15 DIAGNOSIS — C787 Secondary malignant neoplasm of liver and intrahepatic bile duct: Secondary | ICD-10-CM

## 2019-06-15 DIAGNOSIS — E871 Hypo-osmolality and hyponatremia: Secondary | ICD-10-CM

## 2019-06-15 DIAGNOSIS — Z7189 Other specified counseling: Secondary | ICD-10-CM

## 2019-06-15 LAB — CBC WITH DIFFERENTIAL/PLATELET
Abs Immature Granulocytes: 0.01 10*3/uL (ref 0.00–0.07)
Basophils Absolute: 0 10*3/uL (ref 0.0–0.1)
Basophils Relative: 1 %
Eosinophils Absolute: 0 10*3/uL (ref 0.0–0.5)
Eosinophils Relative: 1 %
HCT: 25.3 % — ABNORMAL LOW (ref 36.0–46.0)
Hemoglobin: 8 g/dL — ABNORMAL LOW (ref 12.0–15.0)
Immature Granulocytes: 0 %
Lymphocytes Relative: 37 %
Lymphs Abs: 1.4 10*3/uL (ref 0.7–4.0)
MCH: 30 pg (ref 26.0–34.0)
MCHC: 31.6 g/dL (ref 30.0–36.0)
MCV: 94.8 fL (ref 80.0–100.0)
Monocytes Absolute: 0.5 10*3/uL (ref 0.1–1.0)
Monocytes Relative: 12 %
Neutro Abs: 1.8 10*3/uL (ref 1.7–7.7)
Neutrophils Relative %: 49 %
Platelets: 156 10*3/uL (ref 150–400)
RBC: 2.67 MIL/uL — ABNORMAL LOW (ref 3.87–5.11)
RDW: 21.1 % — ABNORMAL HIGH (ref 11.5–15.5)
WBC: 3.7 10*3/uL — ABNORMAL LOW (ref 4.0–10.5)
nRBC: 0 % (ref 0.0–0.2)

## 2019-06-15 LAB — BASIC METABOLIC PANEL
Anion gap: 8 (ref 5–15)
BUN: 9 mg/dL (ref 8–23)
CO2: 24 mmol/L (ref 22–32)
Calcium: 8.8 mg/dL — ABNORMAL LOW (ref 8.9–10.3)
Chloride: 101 mmol/L (ref 98–111)
Creatinine, Ser: 0.5 mg/dL (ref 0.44–1.00)
GFR calc Af Amer: >60 mL/min (ref >60)
GFR calc non Af Amer: >60 mL/min (ref >60)
Glucose, Bld: 122 mg/dL — ABNORMAL HIGH (ref 70–99)
Potassium: 3.9 mmol/L (ref 3.5–5.1)
Sodium: 133 mmol/L — ABNORMAL LOW (ref 135–145)

## 2019-06-15 NOTE — Patient Instructions (Signed)
Regorafenib tablets What is this medicine? REGORAFENIB (RE goe RAF e nib) is a medicine that targets proteins in cancer cells and stops the cancer cells from growing. It is used to treat colorectal cancer, gastrointestinal stromal tumors (GIST), and liver cancer. This medicine may be used for other purposes; ask your health care provider or pharmacist if you have questions. COMMON BRAND NAME(S): Stivarga What should I tell my health care provider before I take this medicine? They need to know if you have any of these conditions:  bleeding disorders  heart disease  high blood pressure  liver disease  recent surgery  an unusual or allergic reaction to regorafenib, other medicines, foods, dyes, or preservatives  pregnant or trying to get pregnant  breast-feeding How should I use this medicine? Take this medicine by mouth with a glass of water. Follow the directions on the prescription label. Do not take it more often than directed. Take this medicine with food. Do not take with grapefruit juice. Do not stop taking except on your doctor's advice. Talk to your pediatrician regarding the use of this medicine in children. Special care may be needed. Overdosage: If you think you have taken too much of this medicine contact a poison control center or emergency room at once. NOTE: This medicine is only for you. Do not share this medicine with others. What if I miss a dose? If you miss a dose, take it as soon as you can. If it is almost time for your next dose, take only that dose. Do not take double or extra doses. What may interact with this medicine? This medicine may interact with the following:  carbamazepine  irinotecan  itraconazole  ketoconazole  phenobarbital  phenytoin  posaconazole  rifampin  St. John's Wort  telithromycin  voriconazole  warfarin This list may not describe all possible interactions. Give your health care provider a list of all the medicines,  herbs, non-prescription drugs, or dietary supplements you use. Also tell them if you smoke, drink alcohol, or use illegal drugs. Some items may interact with your medicine. What should I watch for while using this medicine? This drug may make you feel generally unwell. This is not uncommon, as chemotherapy can affect healthy cells as well as cancer cells. Report any side effects. Continue your course of treatment even though you feel ill unless your doctor tells you to stop. You may need blood work done while you are taking this medicine. Do not become pregnant while taking this medicine or for 2 months after stopping it. Women should inform their doctor if they wish to become pregnant or think they might be pregnant. Men should not father a child while taking this medicine and for 2 months after stopping it. There is a potential for serious side effects to an unborn child. Talk to your health care professional or pharmacist for more information. Do not breast-feed an infant while taking this medicine or for 2 weeks after stopping it. This medicine should be started at least 2 weeks following major surgery and the site of the surgery should be totally healed. Check with your doctor before scheduling dental work or surgery while you are receiving this treatment. Talk to your doctor if you have recently had surgery or if you have a wound that has not healed. Talk to your doctor about your risk of cancer. You may be more at risk for certain types of cancers if you take this medicine. What side effects may I notice from receiving  this medicine? Side effects that you should report to your doctor or health care professional as soon as possible:  allergic reactions like skin rash, itching or hives, swelling of the face, lips, or tongue  bloody or black, tarry stools  breathing problems  changes in vision  chest pain or chest tightness  confusion  dizziness  feeling faint or lightheaded  high  fever  light-colored stools  nausea, vomiting  red or dark-brown urine  red spots on the skin  right upper belly pain  seizures  severe headache  sores on the hands or feet  spitting up blood or brown material that looks like coffee grounds  stomach pain  unusual bruising or bleeding from the eye, gums, or nose  unusually weak or tired  yellowing of the eyes or skin Side effects that usually do not require medical attention (report to your doctor or health care professional if they continue or are bothersome):  diarrhea  hoarseness  loss of appetite  sore throat  tiredness  weight loss This list may not describe all possible side effects. Call your doctor for medical advice about side effects. You may report side effects to FDA at 1-800-FDA-1088. Where should I keep my medicine? Keep out of the reach of children. Store between 20 and 25 degrees C (68 and 77 degrees F). Keep this medicine in the original container. Throw away any unused medicine 7 weeks after opening the bottle. NOTE: This sheet is a summary. It may not cover all possible information. If you have questions about this medicine, talk to your doctor, pharmacist, or health care provider.  2020 [employer] Standard (2019-01-05 17:03:03)   Apixaban oral tablets What is this medicine? APIXABAN (a PIX a ban) is an anticoagulant (blood thinner). It is used to lower the chance of stroke in people with a medical condition called atrial fibrillation. It is also used to treat or prevent blood clots in the lungs or in the veins. This medicine may be used for other purposes; ask your health care provider or pharmacist if you have questions. COMMON BRAND NAME(S): Eliquis What should I tell my health care provider before I take this medicine? They need to know if you have any of these conditions:  antiphospholipid antibody syndrome  bleeding disorders  bleeding in the brain  blood in your stools (black  or tarry stools) or if you have blood in your vomit  history of blood clots  history of stomach bleeding  kidney disease  liver disease  mechanical heart valve  an unusual or allergic reaction to apixaban, other medicines, foods, dyes, or preservatives  pregnant or trying to get pregnant  breast-feeding How should I use this medicine? Take this medicine by mouth with a glass of water. Follow the directions on the prescription label. You can take it with or without food. If it upsets your stomach, take it with food. Take your medicine at regular intervals. Do not take it more often than directed. Do not stop taking except on your doctor's advice. Stopping this medicine may increase your risk of a blood clot. Be sure to refill your prescription before you run out of medicine. Talk to your pediatrician regarding the use of this medicine in children. Special care may be needed. Overdosage: If you think you have taken too much of this medicine contact a poison control center or emergency room at once. NOTE: This medicine is only for you. Do not share this medicine with others. What if I miss  a dose? If you miss a dose, take it as soon as you can. If it is almost time for your next dose, take only that dose. Do not take double or extra doses. What may interact with this medicine? This medicine may interact with the following:  aspirin and aspirin-like medicines  certain medicines for fungal infections like ketoconazole and itraconazole  certain medicines for seizures like carbamazepine and phenytoin  certain medicines that treat or prevent blood clots like warfarin, enoxaparin, and dalteparin  clarithromycin  NSAIDs, medicines for pain and inflammation, like ibuprofen or naproxen  rifampin  ritonavir  St. John's wort This list may not describe all possible interactions. Give your health care provider a list of all the medicines, herbs, non-prescription drugs, or dietary  supplements you use. Also tell them if you smoke, drink alcohol, or use illegal drugs. Some items may interact with your medicine. What should I watch for while using this medicine? Visit your healthcare professional for regular checks on your progress. You may need blood work done while you are taking this medicine. Your condition will be monitored carefully while you are receiving this medicine. It is important not to miss any appointments. Avoid sports and activities that might cause injury while you are using this medicine. Severe falls or injuries can cause unseen bleeding. Be careful when using sharp tools or knives. Consider using an Copy. Take special care brushing or flossing your teeth. Report any injuries, bruising, or red spots on the skin to your healthcare professional. If you are going to need surgery or other procedure, tell your healthcare professional that you are taking this medicine. Wear a medical ID bracelet or chain. Carry a card that describes your disease and details of your medicine and dosage times. What side effects may I notice from receiving this medicine? Side effects that you should report to your doctor or health care professional as soon as possible:  allergic reactions like skin rash, itching or hives, swelling of the face, lips, or tongue  signs and symptoms of bleeding such as bloody or black, tarry stools; red or dark-brown urine; spitting up blood or brown material that looks like coffee grounds; red spots on the skin; unusual bruising or bleeding from the eye, gums, or nose  signs and symptoms of a blood clot such as chest pain; shortness of breath; pain, swelling, or warmth in the leg  signs and symptoms of a stroke such as changes in vision; confusion; trouble speaking or understanding; severe headaches; sudden numbness or weakness of the face, arm or leg; trouble walking; dizziness; loss of coordination This list may not describe all possible side  effects. Call your doctor for medical advice about side effects. You may report side effects to FDA at 1-800-FDA-1088. Where should I keep my medicine? Keep out of the reach of children. Store at room temperature between 20 and 25 degrees C (68 and 77 degrees F). Throw away any unused medicine after the expiration date. NOTE: This sheet is a summary. It may not cover all possible information. If you have questions about this medicine, talk to your doctor, pharmacist, or health care provider.  2020 [employer]/Gold Standard (2018-07-19 17:39:34)    Enoxaparin injection What is this medicine? ENOXAPARIN (ee nox a PA rin) is used after knee, hip, or abdominal surgeries to prevent blood clotting. It is also used to treat existing blood clots in the lungs or in the veins. This medicine may be used for other purposes; ask your health  care provider or pharmacist if you have questions. COMMON BRAND NAME(S): Lovenox What should I tell my health care provider before I take this medicine? They need to know if you have any of these conditions:  bleeding disorders, hemorrhage, or hemophilia  infection of the heart or heart valves  kidney or liver disease  previous stroke  prosthetic heart valve  recent surgery or delivery of a baby  ulcer in the stomach or intestine, diverticulitis, or other bowel disease  an unusual or allergic reaction to enoxaparin, heparin, pork or pork products, other medicines, foods, dyes, or preservatives  pregnant or trying to get pregnant  breast-feeding How should I use this medicine? This medicine is for injection under the skin. It is usually given by a health-care professional. You or a family member may be trained on how to give the injections. If you are to give yourself injections, make sure you understand how to use the syringe, measure the dose if necessary, and give the injection. To avoid bruising, do not rub the site where this medicine has been injected.  Do not take your medicine more often than directed. Do not stop taking except on the advice of your doctor or health care professional. Make sure you receive a puncture-resistant container to dispose of the needles and syringes once you have finished with them. Do not reuse these items. Return the container to your doctor or health care professional for proper disposal. Talk to your pediatrician regarding the use of this medicine in children. Special care may be needed. Overdosage: If you think you have taken too much of this medicine contact a poison control center or emergency room at once. NOTE: This medicine is only for you. Do not share this medicine with others. What if I miss a dose? If you miss a dose, take it as soon as you can. If it is almost time for your next dose, take only that dose. Do not take double or extra doses. What may interact with this medicine?  aspirin and aspirin-like medicines  certain medicines that treat or prevent blood clots  dipyridamole  NSAIDs, medicines for pain and inflammation, like ibuprofen or naproxen This list may not describe all possible interactions. Give your health care provider a list of all the medicines, herbs, non-prescription drugs, or dietary supplements you use. Also tell them if you smoke, drink alcohol, or use illegal drugs. Some items may interact with your medicine. What should I watch for while using this medicine? Visit your healthcare professional for regular checks on your progress. You may need blood work done while you are taking this medicine. Your condition will be monitored carefully while you are receiving this medicine. It is important not to miss any appointments. If you are going to need surgery or other procedure, tell your healthcare professional that you are using this medicine. Using this medicine for a long time may weaken your bones and increase the risk of bone fractures. Avoid sports and activities that might cause  injury while you are using this medicine. Severe falls or injuries can cause unseen bleeding. Be careful when using sharp tools or knives. Consider using an Copy. Take special care brushing or flossing your teeth. Report any injuries, bruising, or red spots on the skin to your healthcare professional. Wear a medical ID bracelet or chain. Carry a card that describes your disease and details of your medicine and dosage times. What side effects may I notice from receiving this medicine? Side effects that  you should report to your doctor or health care professional as soon as possible:  allergic reactions like skin rash, itching or hives, swelling of the face, lips, or tongue  bone pain  signs and symptoms of bleeding such as bloody or black, tarry stools; red or dark-brown urine; spitting up blood or brown material that looks like coffee grounds; red spots on the skin; unusual bruising or bleeding from the eye, gums, or nose  signs and symptoms of a blood clot such as chest pain; shortness of breath; pain, swelling, or warmth in the leg  signs and symptoms of a stroke such as changes in vision; confusion; trouble speaking or understanding; severe headaches; sudden numbness or weakness of the face, arm or leg; trouble walking; dizziness; loss of coordination Side effects that usually do not require medical attention (report to your doctor or health care professional if they continue or are bothersome):  hair loss  pain, redness, or irritation at site where injected This list may not describe all possible side effects. Call your doctor for medical advice about side effects. You may report side effects to FDA at 1-800-FDA-1088. Where should I keep my medicine? Keep out of the reach of children. Store at room temperature between 15 and 30 degrees C (59 and 86 degrees F). Do not freeze. If your injections have been specially prepared, you may need to store them in the refrigerator. Ask your  pharmacist. Throw away any unused medicine after the expiration date. NOTE: This sheet is a summary. It may not cover all possible information. If you have questions about this medicine, talk to your doctor, pharmacist, or health care provider.  2020 [employer] Standard (2017-11-03 11:25:34)

## 2019-06-15 NOTE — Progress Notes (Signed)
[facility]  [address], [address] [place], [place] [address] Phone: [phone]  Fax: [phone]   Clinic Day:  06/15/2019  Referring physician: Gae Bon, NP  Chief Complaint: Latoya Jones is a 73 y.o. female withstage IV colon cancer currently day 26 s/p cycle #3 Lonsurf who is seen for urgent assessment following restaging CT scan and discussion regarding direction of therapy.  HPI:  The patient was last seen in the medical oncology clinic on 06/11/2019. At that time, she was doing "so-so." She was eating well and denied any nausea or vomiting. She noted occasional loose stools.  CBC included a hematocrit of 26.4, hemoglobin 8.4, platelets 129,000, white count 1900 with an ANC of 700.  Chest, abdomen, pelvis CT on 06/14/2019 revealed significant worsening of metastatic disease. There was increasing mesenteric and serosal metastatic disease, extending superiorly through the mesentery, encasing more peripheral branches of the SMA, and extending into the region of the pancreatic head, possibly encasing or invading the pancreatic head. 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 The portal and splenic veins remained patent. Metastatic disease also extended inferiorly, extending along thickened walls of small bowel loops in the left pelvis consistent with serosal metastasis. There was increased attenuation in the fat of the upper abdomen with wall thickening in the region of the gastric antrum and along much of the duodenum.  The nodule in the right upper lobe had slowly enlarged, c/w metastatic disease. There was worsening metastatic disease in the liver with enlarging capsular implants and at least 2 new hepatic lesions. There was worsening metastatic disease at the ileocecal suture line. The bladder was thick walled raising the possibility of cystitis.   During the interim, she has done well.  She is  eating well. She reports one episode of diarrhea last night, followed by a more solid BM.  She has occasional stomach pain at night which is improved with Tylenol.   She has been seen in psychiatry following an increase in schizophrenic episodes.   She agreed to be DNR/DNI. She is considering further oral chemotherapy treatment and anti-coagulation treatment.    Past Medical History:  Diagnosis Date  . Colon cancer ([place])   . Hypertension     Past Surgical History:  Procedure Laterality Date  . COLOSTOMY REVISION N/A 12/06/2017   Procedure: COLON RESECTION RIGHT;  Surgeon: Florene Glen, MD;  Location: [facility] ORS;  Service: General;  Laterality: N/A;  . DILATION AND CURETTAGE, DIAGNOSTIC / THERAPEUTIC    . PORTACATH PLACEMENT N/A 01/06/2018   Procedure: INSERTION PORT-A-CATH;  Surgeon: Vickie Epley, MD;  Location: [facility] ORS;  Service: General;  Laterality: N/A;    Family History  Problem Relation Age of Onset  . ALS Mother   . ALS Father     Social History:  reports that she quit smoking about 11 years ago. She has never used smokeless tobacco. She reports that she does not drink alcohol or use drugs.  She lives in a family care home; generally accompanied by [name]. She is alonein clinictoday.I spoke with Latoya Jones on the phone (she was in the parking lot)[phone].    Allergies: No Known Allergies  Current Medications: Current Outpatient Medications  Medication Sig Dispense Refill  . acetaminophen (TYLENOL) 500 MG tablet Take 1,000 mg by mouth every 8 (eight) hours as needed for mild pain.    Marland Kitchen alendronate (FOSAMAX) 70 MG tablet Take 70 mg by mouth once a week.    Marland Kitchen  amLODipine (NORVASC) 10 MG tablet Take 10 mg by mouth daily.     . ARIPiprazole (ABILIFY) 15 MG tablet Take 7.5 mg by mouth daily.    Marland Kitchen atorvastatin (LIPITOR) 20 MG tablet Take 1 tablet by mouth daily.    . calcium carbonate (TUMS - DOSED IN MG ELEMENTAL CALCIUM) 500 MG chewable tablet Chew 1 tablet by  mouth 2 (two) times daily as needed for indigestion or heartburn.    . Calcium Carbonate-Vitamin D3 (CALCIUM 600-D) 600-400 MG-UNIT TABS Take 1 tablet by mouth daily.    Marland Kitchen loratadine (CLARITIN) 10 MG tablet Take 1 tablet (10 mg total) by mouth daily as needed for allergies. Take 1 tablet the day before pump removal, 1 tablet the day of pump removal and 1 tablet the day after pump removal. Take as directed with each cycle of  chemotherapy treatment 30 tablet 1  . memantine (NAMENDA XR) 14 MG CP24 24 hr capsule Take 14 mg by mouth at bedtime.     . metoprolol succinate (TOPROL-XL) 50 MG 24 hr tablet Take 25 mg by mouth every morning. Take with or immediately following a meal.     . Multiple Vitamins-Minerals (SENTRY SENIOR PO) Take 1 tablet by mouth daily.    Marland Kitchen perphenazine (TRILAFON) 4 MG tablet Take 4 mg by mouth 2 (two) times daily.    Marland Kitchen lidocaine-prilocaine (EMLA) cream Apply 1 application topically as needed. 1 hour prior to  each chemotherapy treatment. Place small amount over port site and place saran wrap over the cream to protect clothing (Patient not taking: Reported on 06/04/2019) 30 g 0  . ondansetron (ZOFRAN) 8 MG tablet Take 1 tablet (8 mg total) by mouth 2 (two) times daily as needed for nausea or vomiting. (Patient not taking: Reported on 06/04/2019) 20 tablet 0  . prochlorperazine (COMPAZINE) 10 MG tablet Take 1 tablet (10 mg total) by mouth every 6 (six) hours as needed for nausea or vomiting. (Patient not taking: Reported on 06/04/2019) 30 tablet 0  . trifluridine-tipiracil (LONSURF) 20-8.19 MG tablet Take 3 tablets (60 mg of trifluridine total) by mouth 2 (two) times daily after a meal. Take on days 1-5,8-12. Repeat every 28days (Patient not taking: Reported on 06/11/2019) 60 tablet 0   No current facility-administered medications for this visit.    Facility-Administered Medications Ordered in Other Visits  Medication Dose Route Frequency Provider Last Rate Last Dose  . heparin lock  flush 100 unit/mL  500 Units Intravenous Once Sindy Guadeloupe, MD      . heparin lock flush 100 unit/mL  500 Units Intravenous Once Sindy Guadeloupe, MD      . sodium chloride flush (NS) 0.9 % injection 10 mL  10 mL Intravenous PRN Sindy Guadeloupe, MD   10 mL at 02/06/18 0845  . sodium chloride flush (NS) 0.9 % injection 10 mL  10 mL Intravenous PRN Sindy Guadeloupe, MD   10 mL at 03/27/18 0909    Review of Systems  Constitutional: Positive for malaise/fatigue. Negative for chills, diaphoresis, fever and weight loss (up 1lb).       "Hanging in there".  HENT: Negative.  Negative for congestion, ear pain, hearing loss, nosebleeds, sinus pain and sore throat.        Rhinorrhea   Eyes: Negative.  Negative for blurred vision and double vision.  Respiratory: Negative.  Negative for cough, shortness of breath and wheezing.   Cardiovascular: Negative for chest pain, palpitations, orthopnea, leg swelling and PND.  H/o asymptomatic bradycardia.    Gastrointestinal: Positive for abdominal pain (stomach, Tylenol 1x nightly) and diarrhea (little last night). Negative for blood in stool, constipation, melena, nausea and vomiting.       Eating well and back at community table.  Genitourinary: Negative.  Negative for dysuria, frequency, hematuria and urgency.  Musculoskeletal: Negative.  Negative for back pain, joint pain and myalgias.  Skin: Negative.  Negative for rash.  Neurological: Negative.  Negative for dizziness, tingling, sensory change, weakness and headaches.  Endo/Heme/Allergies: Negative.  Does not bruise/bleed easily.  Psychiatric/Behavioral: Positive for memory loss. Negative for depression and substance abuse. The patient is not nervous/anxious and does not have insomnia.        Schizophrenia   All other systems reviewed and are negative.  Performance status (ECOG): 2  Vitals Blood pressure (!) 131/56, pulse 73, temperature (!) 97.2 F (36.2 C), temperature source Tympanic, resp. rate  18, height '5\' 5"'  (1.651 m), weight 155 lb 6.8 oz (70.5 kg), SpO2 100 %.   Physical Exam  Constitutional: She is oriented to person, place, and time. She appears well-developed and well-nourished. No distress.  HENT:  Head: Normocephalic and atraumatic.  Mouth/Throat: Oropharynx is clear and moist. No oropharyngeal exudate.  Wearing a mask and sequin hat. Short thinning gray hair.   Eyes: Pupils are equal, round, and reactive to light. Conjunctivae and EOM are normal. No scleral icterus.  Glasses.  Blue eyes.    Neck: Normal range of motion. Neck supple.  Cardiovascular: Normal rate, regular rhythm and normal heart sounds.  No murmur heard. Pulmonary/Chest: Effort normal and breath sounds normal. No respiratory distress. She has no wheezes.  Abdominal: Soft. Bowel sounds are normal. She exhibits no distension and no mass. There is abdominal tenderness (mild). There is no rebound and no guarding.  Musculoskeletal: Normal range of motion.        General: No edema (trace ankle).  Lymphadenopathy:    She has no cervical adenopathy.    She has no axillary adenopathy.       Right: No supraclavicular adenopathy present.       Left: No supraclavicular adenopathy present.  Neurological: She is alert and oriented to person, place, and time.  Skin: Skin is warm and dry. She is not diaphoretic. No erythema. There is pallor.  Psychiatric: She has a normal mood and affect. Her behavior is normal. Judgment and thought content normal.  Schizophrenia.  Nursing note and vitals reviewed.   Appointment on 06/15/2019  Component Date Value Ref Range Status  . WBC 06/15/2019 3.7* 4.0 - 10.5 K/uL Final  . RBC 06/15/2019 2.67* 3.87 - 5.11 MIL/uL Final  . Hemoglobin 06/15/2019 8.0* 12.0 - 15.0 g/dL Final  . HCT 06/15/2019 25.3* 36.0 - 46.0 % Final  . MCV 06/15/2019 94.8  80.0 - 100.0 fL Final  . MCH 06/15/2019 30.0  26.0 - 34.0 pg Final  . MCHC 06/15/2019 31.6  30.0 - 36.0 g/dL Final  . RDW 06/15/2019  21.1* 11.5 - 15.5 % Final  . Platelets 06/15/2019 156  150 - 400 K/uL Final  . nRBC 06/15/2019 0.0  0.0 - 0.2 % Final  . Neutrophils Relative % 06/15/2019 49  % Final  . Neutro Abs 06/15/2019 1.8  1.7 - 7.7 K/uL Final  . Lymphocytes Relative 06/15/2019 37  % Final  . Lymphs Abs 06/15/2019 1.4  0.7 - 4.0 K/uL Final  . Monocytes Relative 06/15/2019 12  % Final  . Monocytes Absolute 06/15/2019 0.5  0.1 - 1.0 K/uL Final  . Eosinophils Relative 06/15/2019 1  % Final  . Eosinophils Absolute 06/15/2019 0.0  0.0 - 0.5 K/uL Final  . Basophils Relative 06/15/2019 1  % Final  . Basophils Absolute 06/15/2019 0.0  0.0 - 0.1 K/uL Final  . Immature Granulocytes 06/15/2019 0  % Final  . Abs Immature Granulocytes 06/15/2019 0.01  0.00 - 0.07 K/uL Final   Performed at [facility], [address] [place], [place] [address]    Assessment:  Kayson Bullis is a 73 y.o. female with stage IV colon cancerwith peritoneal metastasis and possible liver metastasis. She is s/p exploratory laparotomy with right hemicolectomy and terminal ileum resection and separate small bowel resection on 12/06/2017. 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 Radial margin was positive for invasive carcinoma. There were 2 colon adenomas (2.5 and 1.8 cm) of the ascending colon. Metastatic adenocarcinoma was seen in 4 of 13 lymph nodes. There were 2 tumor deposits. There was a segment of small intestine with abscess which also showed adenocarcinoma invading the distal appendix with perforation. One mesenteric lymph node was negative for malignancy. Retroperitoneal tumor removal was positive for mucinous adenocarcinoma. Proximal and distal margins were negative. Pathologic stagewas [id number]. MSI stable. KRASmutation was positive  She received 5 cycles of FOLFOX(01/10/2018 - 03/27/2018). She began Neulasta with cycle #2. Oxaliplatin was decreased  from 85 mg/m2 to 65 mg/m2 with cycle #4. She received 6 cycles of5FU + LV with Avastin(05/01/2018 - 07/17/2018).  She received 10 cycles of FOLIFIRI + Avastin(07/31/2018 - 02/13/2019). She receives chemotherapy every 3 weeks secondary to tolerance. Irinotecan was decreased from 150 mg/m2 to 125 mg/m2 with cycle #2. 5FU bolus was discontinued with cycle #6.  Chest, abdomen, and pelvic CTon 03/02/2019 revealed progressive disease within the abdomen and pelvis. There was increase in soft tissue thickening along the surgical site at the ileocolic junction (2.9 x 1.9 cm to 4.6 x 2.5 cm). There was new and progressive peritoneal metastasis (2.1 x 2.4 cm). There was no bowel obstruction or other acute complication. There was enlarging soft tissue metastasis on the right pelvic oblique musculature (1.1 cm to 1.7 cm).  She isday28ofcycle #3Lonsurf (03/14/2019- 05/21/2019).Cycle #2 was notable for pancytopenia. Shehad grade III anemia andrequired 1 unit of PRBCs on 05/04/2019, 06/05/2019, and 06/08/2019.  She receives Retacrit (last 06/04/2019).  Chest, abdomen, pelvis CT on 06/14/2019 revealed significant worsening of metastatic disease. There was increasing mesenteric and serosal metastatic disease, extending superiorly through the mesentery, encasing more peripheral branches of the SMA, and extending into the region of the pancreatic head, possibly encasing or invading the pancreatic head. 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 The portal and splenic veins remained patent. Metastatic disease also extended inferiorly, extending along thickened walls of small bowel loops in the left pelvis consistent with serosal metastasis. There was increased attenuation in the fat of the upper abdomen with wall thickening in the region of the gastric antrum and along much of the duodenum.  The nodule in the right upper lobe had slowly  enlarged, c/w metastatic disease. There was worsening metastatic disease in the liver with enlarging capsular implants and at least 2 new hepatic lesions. There was worsening metastatic disease at the ileocecal suture line. The bladder was thick walled raising the possibility of cystitis.   CEAhas been followed: 4.8 on 12/04/2017, 3.1 on 01/10/2018, 4.2 on 03/06/2018, 3.8 on 05/01/2018, 9.2 on 07/31/2018,  5.9 on 09/18/2018, 5.0 on 10/30/2018, 5.0 on 02/13/2019, 7.5 on 03/13/2019, 8.1 on 04/12/2019, 9.0 on 05/07/2019, and 10.7 on 05/17/2019.  She has macrocytic RBC indices. She has a history of alow B12 level. B12 was 266 on 01/30/2018 and 2448 on 01/22/2019. Folatewas 32 on 01/30/2018 and 45 on 01/22/2019. TSHwas 0.913 on 12/03/2017 and 1.627 on 01/22/2019.Ferritinwas 535 on 04/30/2019.  She has paranoid schizophrenia with delusions. She lives in a group home.  Code status is DNR/DNI.  Symptomatically, she has occasional abdominal pain at night.  She is eating well.  She had a little diarrhea last night.  Exam reveals a non-tender abdomen.  Plan: 1.  Labs today: CBC w/ diff, BMP.  2.  Stage IV colon cancer Sheis s/pFOLFOX then 5FU + LV + Avastin. She is s/pFOLFIRI + Avastin She isday26s/pcycle #3Lonsurf. Restaging CT scans discussed with patient.  Copy of report provided patient.  Images personally reviewed.  Agree with radiology interpretation.  Patient has dramatic worsening of disease. Discuss plan to discontinue Lonsurf. Discuss consideration of regorafenib (Stivarga).  Previously, this medication was discussed with the patient.    She would take pills on day 1 through 21 and then 7 days off.  Side effects were reviewed and included hand foot syndrome, rash, hypertension, liver toxicity, bleeding and perforation.   Discussed concern for occlusion of the superior mesenteric vein and GI symptoms.   Imaging discussed with Dr. Lucky Cowboy of vascular surgery.  No surgical  intervention felt of benefit.   Discuss consideration of low-dose Eliquis (2.5 mg p.o. twice daily) as likely thrombosis will occur.   Side effects of blood thinners were reviewed well as treatment if bleeding occurred Discuss supportive care with Hospice if decision made not to pursue additional therapy. Discussed the CODE STATUS in detail.  Patient agrees to DNR/DNI. 3.Chemotherapy induced anemia Hemoglobin8.0. MCV94.8.WBC3700(ANC 1800). Platelets156,000. Counts are recovering. Anticipate reinstitution of Retacrit next week. 4.Hyponatremia Sodium is 133.  Continue to encourage fluids with electrolytes and added salt.  5.   Code status  Patient makes decisions on her own with consultation at times with [name].    In the past, her family has been difficult to reach and obtain assistance regarding decisions (sister and others).    [name] stated that they would try to contact her sister, Latoya Jones.   Review progressive disease status and decide whether to pursue treatment or supportive care.  Multiple questions asked and answered 6.   Patient to call back regarding decision about blood thinner (Eliquis or Lovenox) and possible treatment (Stivarga) 7.   Patient to keep appointment on Monday, 06/18/2019.  I discussed the assessment and treatment plan with the patient.  The patient was provided an opportunity to ask questions and all were answered.  The patient agreed with the plan and demonstrated an understanding of the instructions.  The patient was advised to call back if the symptoms worsen or if the condition fails to improve as anticipated.  I provided 30 minutes of face-to-face time during this this encounter and > 50% was spent counseling as documented under my assessment and plan.    Lequita Asal, MD, PhD    06/15/2019, 2:41 PM  I, Molly Dorshimer, am acting as Education administrator for [employer]. Mike Gip,  MD, PhD.  I, Melissa C. Mike Gip, MD, have reviewed the above documentation for accuracy and completeness, and I agree with the above.

## 2019-06-15 NOTE — Progress Notes (Signed)
No new changes noted today 

## 2019-06-18 ENCOUNTER — Ambulatory Visit: Payer: Medicare Other

## 2019-06-18 ENCOUNTER — Other Ambulatory Visit: Payer: Self-pay

## 2019-06-18 ENCOUNTER — Other Ambulatory Visit: Payer: Medicare Other

## 2019-06-18 ENCOUNTER — Ambulatory Visit: Payer: Medicare Other | Admitting: Hematology and Oncology

## 2019-06-18 ENCOUNTER — Inpatient Hospital Stay: Payer: Medicare Other

## 2019-06-18 VITALS — BP 134/79 | HR 76 | Temp 97.3°F | Resp 18

## 2019-06-18 DIAGNOSIS — C181 Malignant neoplasm of appendix: Secondary | ICD-10-CM | POA: Diagnosis not present

## 2019-06-18 DIAGNOSIS — E871 Hypo-osmolality and hyponatremia: Secondary | ICD-10-CM

## 2019-06-18 DIAGNOSIS — D6481 Anemia due to antineoplastic chemotherapy: Secondary | ICD-10-CM

## 2019-06-18 DIAGNOSIS — D701 Agranulocytosis secondary to cancer chemotherapy: Secondary | ICD-10-CM

## 2019-06-18 DIAGNOSIS — Z23 Encounter for immunization: Secondary | ICD-10-CM

## 2019-06-18 DIAGNOSIS — C189 Malignant neoplasm of colon, unspecified: Secondary | ICD-10-CM

## 2019-06-18 DIAGNOSIS — C787 Secondary malignant neoplasm of liver and intrahepatic bile duct: Secondary | ICD-10-CM

## 2019-06-18 DIAGNOSIS — T451X5A Adverse effect of antineoplastic and immunosuppressive drugs, initial encounter: Secondary | ICD-10-CM

## 2019-06-18 DIAGNOSIS — C786 Secondary malignant neoplasm of retroperitoneum and peritoneum: Secondary | ICD-10-CM

## 2019-06-18 LAB — CBC WITH DIFFERENTIAL/PLATELET
Abs Immature Granulocytes: 0.03 10*3/uL (ref 0.00–0.07)
Basophils Absolute: 0 10*3/uL (ref 0.0–0.1)
Basophils Relative: 0 %
Eosinophils Absolute: 0 10*3/uL (ref 0.0–0.5)
Eosinophils Relative: 0 %
HCT: 26.3 % — ABNORMAL LOW (ref 36.0–46.0)
Hemoglobin: 8.3 g/dL — ABNORMAL LOW (ref 12.0–15.0)
Immature Granulocytes: 1 %
Lymphocytes Relative: 25 %
Lymphs Abs: 1.3 10*3/uL (ref 0.7–4.0)
MCH: 30.3 pg (ref 26.0–34.0)
MCHC: 31.6 g/dL (ref 30.0–36.0)
MCV: 96 fL (ref 80.0–100.0)
Monocytes Absolute: 0.6 10*3/uL (ref 0.1–1.0)
Monocytes Relative: 11 %
Neutro Abs: 3.1 10*3/uL (ref 1.7–7.7)
Neutrophils Relative %: 63 %
Platelets: 146 10*3/uL — ABNORMAL LOW (ref 150–400)
RBC: 2.74 MIL/uL — ABNORMAL LOW (ref 3.87–5.11)
RDW: 21.3 % — ABNORMAL HIGH (ref 11.5–15.5)
WBC: 5 10*3/uL (ref 4.0–10.5)
nRBC: 0 % (ref 0.0–0.2)

## 2019-06-18 LAB — SAMPLE TO BLOOD BANK

## 2019-06-18 LAB — BASIC METABOLIC PANEL
Anion gap: 7 (ref 5–15)
BUN: 9 mg/dL (ref 8–23)
CO2: 25 mmol/L (ref 22–32)
Calcium: 9.4 mg/dL (ref 8.9–10.3)
Chloride: 99 mmol/L (ref 98–111)
Creatinine, Ser: 0.49 mg/dL (ref 0.44–1.00)
GFR calc Af Amer: >60 mL/min (ref >60)
GFR calc non Af Amer: >60 mL/min (ref >60)
Glucose, Bld: 126 mg/dL — ABNORMAL HIGH (ref 70–99)
Potassium: 3.9 mmol/L (ref 3.5–5.1)
Sodium: 131 mmol/L — ABNORMAL LOW (ref 135–145)

## 2019-06-18 MED ORDER — EPOETIN ALFA-EPBX 10000 UNIT/ML IJ SOLN
10000.0000 [IU] | Freq: Once | INTRAMUSCULAR | Status: AC
  Administered 2019-06-18: 14:00:00 10000 [IU] via SUBCUTANEOUS

## 2019-06-18 NOTE — Patient Instructions (Signed)

## 2019-06-18 NOTE — Progress Notes (Signed)
A copy of today's labs was given to her caregiver along with an explanation to increase sodium intake.  Patient has an appointment on Thursday and was instructed to return to make a decision about blood thinner.  Patient and caregiver verbalized understanding.

## 2019-06-19 NOTE — Progress Notes (Signed)
[facility]  [address] [place], [place] [address] Phone: [phone]  Fax: [phone]   Clinic Day:  06/19/2019  Referring physician: Gae Bon, NP  Chief Complaint: Latoya Jones is a 73 y.o. female with IV colon cancer currently day 29 s/p cycle #3 Lonsurf  who is seen for 1 week  assessment.   HPI: The patient was last seen in the medical oncology clinic on 06/15/2019. At that time, she had occasional abdominal pain at night. She was eating well.  She had a little diarrhea last night.  Exam revealed a non-tender abdomen. Hematocrit 25.3, hemoglobin 8.0, platelets 156,000, WBC 3,700. Sodium 133. Calcium 8.8.     Scans revealed progressive disease.  We discussed supportive care (Hospice or a trial of Stivarga).  We discussed consideration of anticoagulation given concern for occlusion of the SMV.   Labs on 06/18/2019: Hematocrit 26.3, hemoglobin 8.3, platelets 146,000, WBC 5,000. She received Retacrit.  During the interim, the patient is feeling "really weird". She notes feeling like this for a while. She reports that she is having a panic attack.   Latoya Jones reports she is eating well. She had an accident with her bowels 5 days ago. Latoya Jones notes that she is scratching on her bottom until she bleeds. Her bowels are good, some solid forming and some loose.     She decided not to pursue Stivarga.  She wishes to try oral anticoagulation (Eliquis).   Past Medical History:  Diagnosis Date   Colon cancer ([facility])    Hypertension     Past Surgical History:  Procedure Laterality Date   COLOSTOMY REVISION N/A 12/06/2017   Procedure: COLON RESECTION RIGHT;  Surgeon: Florene Glen, MD;  Location: [facility] ORS;  Service: General;  Laterality: N/A;   DILATION AND CURETTAGE, DIAGNOSTIC / THERAPEUTIC     PORTACATH PLACEMENT N/A 01/06/2018   Procedure: INSERTION PORT-A-CATH;  Surgeon: Vickie Epley, MD;  Location: [facility] ORS;  Service: General;   Laterality: N/A;    Family History  Problem Relation Age of Onset   ALS Mother    ALS Father     Social History:  reports that she quit smoking about 11 years ago. She has never used smokeless tobacco. She reports that she does not drink alcohol or use drugs. She lives in a family care home; generally accompanied by Latoya Jones. She is alonein clinictoday.I spoke with Latoya Jones on the phone (she was in the parking lot)[phone]. The patient is accompanied by [facility] via phone today.  Allergies: No Known Allergies  Current Medications: Current Outpatient Medications  Medication Sig Dispense Refill   acetaminophen (TYLENOL) 500 MG tablet Take 1,000 mg by mouth every 8 (eight) hours as needed for mild pain.     alendronate (FOSAMAX) 70 MG tablet Take 70 mg by mouth once a week.     amLODipine (NORVASC) 10 MG tablet Take 10 mg by mouth daily.      ARIPiprazole (ABILIFY) 15 MG tablet Take 7.5 mg by mouth daily.     atorvastatin (LIPITOR) 20 MG tablet Take 1 tablet by mouth daily.     calcium carbonate (TUMS - DOSED IN MG ELEMENTAL CALCIUM) 500 MG chewable tablet Chew 1 tablet by mouth 2 (two) times daily as needed for indigestion or heartburn.     Calcium Carbonate-Vitamin D3 (CALCIUM 600-D) 600-400 MG-UNIT TABS Take 1 tablet by mouth daily.     lidocaine-prilocaine (EMLA) cream Apply 1 application topically as needed. 1 hour prior  to  each chemotherapy treatment. Place small amount over port site and place saran wrap over the cream to protect clothing (Patient not taking: Reported on 06/04/2019) 30 g 0   loratadine (CLARITIN) 10 MG tablet Take 1 tablet (10 mg total) by mouth daily as needed for allergies. Take 1 tablet the day before pump removal, 1 tablet the day of pump removal and 1 tablet the day after pump removal. Take as directed with each cycle of  chemotherapy treatment 30 tablet 1   memantine (NAMENDA XR) 14 MG CP24 24 hr capsule Take 14 mg by mouth at bedtime.       metoprolol succinate (TOPROL-XL) 50 MG 24 hr tablet Take 25 mg by mouth every morning. Take with or immediately following a meal.      Multiple Vitamins-Minerals (SENTRY SENIOR PO) Take 1 tablet by mouth daily.     ondansetron (ZOFRAN) 8 MG tablet Take 1 tablet (8 mg total) by mouth 2 (two) times daily as needed for nausea or vomiting. (Patient not taking: Reported on 06/04/2019) 20 tablet 0   perphenazine (TRILAFON) 4 MG tablet Take 4 mg by mouth 2 (two) times daily.     prochlorperazine (COMPAZINE) 10 MG tablet Take 1 tablet (10 mg total) by mouth every 6 (six) hours as needed for nausea or vomiting. (Patient not taking: Reported on 06/04/2019) 30 tablet 0   trifluridine-tipiracil (LONSURF) 20-8.19 MG tablet Take 3 tablets (60 mg of trifluridine total) by mouth 2 (two) times daily after a meal. Take on days 1-5,8-12. Repeat every 28days (Patient not taking: Reported on 06/11/2019) 60 tablet 0   No current facility-administered medications for this visit.    Facility-Administered Medications Ordered in Other Visits  Medication Dose Route Frequency Provider Last Rate Last Dose   heparin lock flush 100 unit/mL  500 Units Intravenous Once Sindy Guadeloupe, MD       heparin lock flush 100 unit/mL  500 Units Intravenous Once Sindy Guadeloupe, MD       sodium chloride flush (NS) 0.9 % injection 10 mL  10 mL Intravenous PRN Sindy Guadeloupe, MD   10 mL at 02/06/18 0845   sodium chloride flush (NS) 0.9 % injection 10 mL  10 mL Intravenous PRN Sindy Guadeloupe, MD   10 mL at 03/27/18 0909    Review of Systems  Constitutional: Positive for malaise/fatigue and weight loss (down 2 lbs.). Negative for chills, diaphoresis and fever.       Feeling "really weird".  HENT: Negative.  Negative for congestion, ear pain, hearing loss, nosebleeds, sinus pain and sore throat.   Eyes: Negative.  Negative for blurred vision and double vision.  Respiratory: Negative.  Negative for cough, shortness of breath and wheezing.    Cardiovascular: Negative for chest pain, palpitations, orthopnea, leg swelling and PND.       H/o asymptomatic bradycardia.    Gastrointestinal: Negative for abdominal pain, blood in stool, constipation, diarrhea, melena, nausea and vomiting.       Eating well and back at community table.  Genitourinary: Negative.  Negative for dysuria, frequency, hematuria and urgency.  Musculoskeletal: Negative.  Negative for back pain, joint pain and myalgias.  Skin: Positive for itching (bottom). Negative for rash.  Neurological: Negative.  Negative for dizziness, tingling, sensory change, weakness and headaches.  Endo/Heme/Allergies: Negative.  Does not bruise/bleed easily.  Psychiatric/Behavioral: Positive for memory loss. Negative for depression and substance abuse. The patient is nervous/anxious (panic attack). The patient does not have  insomnia.        Schizophrenia   All other systems reviewed and are negative.  Performance status (ECOG): 2  Vitals Blood pressure 111/77, pulse 80, temperature 98.1 F (36.7 C), temperature source Tympanic, resp. rate 16, weight 153 lb 10.6 oz (69.7 kg), SpO2 99 %.  Physical Exam  Constitutional: She is oriented to person, place, and time. She appears well-developed and well-nourished. No distress.  HENT:  Head: Normocephalic and atraumatic.  Mouth/Throat: Oropharynx is clear and moist. No oropharyngeal exudate.  Wearing a mask and sequin hat. Short thinning gray hair.   Eyes: Pupils are equal, round, and reactive to light. Conjunctivae and EOM are normal. No scleral icterus.  Glasses.  Blue eyes.    Neck: Normal range of motion. Neck supple.  Cardiovascular: Normal rate, regular rhythm and normal heart sounds.  No murmur heard. Pulmonary/Chest: Effort normal and breath sounds normal. No respiratory distress. She has no wheezes.  Abdominal: Soft. Bowel sounds are normal. She exhibits no distension and no mass. There is no abdominal tenderness. There is no  rebound and no guarding.  Musculoskeletal: Normal range of motion.        General: Edema (trace ankle) present.  Lymphadenopathy:    She has no cervical adenopathy.    She has no axillary adenopathy.       Right: No supraclavicular adenopathy present.       Left: No supraclavicular adenopathy present.  Neurological: She is alert and oriented to person, place, and time.  Skin: Skin is warm and dry. She is not diaphoretic. No erythema.  Hyperpigmented sacrum with excoriations.  Psychiatric: She has a normal mood and affect. Her behavior is normal. Judgment and thought content normal.  Schizophrenia.  Nursing note and vitals reviewed.   No visits with results within 3 Day(s) from this visit.  Latest known visit with results is:  Appointment on 06/18/2019  Component Date Value Ref Range Status   Blood Bank Specimen 06/18/2019 SAMPLE AVAILABLE FOR TESTING   Final   Sample Expiration 06/18/2019    Final                   Value:06/21/2019,2359 Performed at [facility], [address] [place], [place] [address]    Sodium 06/18/2019 131* 135 - 145 mmol/L Final   Potassium 06/18/2019 3.9  3.5 - 5.1 mmol/L Final   Chloride 06/18/2019 99  98 - 111 mmol/L Final   CO2 06/18/2019 25  22 - 32 mmol/L Final   Glucose, Bld 06/18/2019 126* 70 - 99 mg/dL Final   BUN 06/18/2019 9  8 - 23 mg/dL Final   Creatinine, Ser 06/18/2019 0.49  0.44 - 1.00 mg/dL Final   Calcium 06/18/2019 9.4  8.9 - 10.3 mg/dL Final   GFR calc non Af Amer 06/18/2019 >60  >60 mL/min Final   GFR calc Af Amer 06/18/2019 >60  >60 mL/min Final   Anion gap 06/18/2019 7  5 - 15 Final   Performed at [facility] [address] [place], [place] [address]   WBC 06/18/2019 5.0  4.0 - 10.5 K/uL Final   RBC 06/18/2019 2.74* 3.87 - 5.11 MIL/uL Final   Hemoglobin 06/18/2019 8.3* 12.0 - 15.0 g/dL Final   HCT 06/18/2019 26.3* 36.0 - 46.0 % Final   MCV 06/18/2019 96.0  80.0 - 100.0 fL Final   MCH  06/18/2019 30.3  26.0 - 34.0 pg Final   MCHC 06/18/2019 31.6  30.0 - 36.0 g/dL Final  RDW 06/18/2019 21.3* 11.5 - 15.5 % Final   Platelets 06/18/2019 146* 150 - 400 K/uL Final   nRBC 06/18/2019 0.0  0.0 - 0.2 % Final   Neutrophils Relative % 06/18/2019 63  % Final   Neutro Abs 06/18/2019 3.1  1.7 - 7.7 K/uL Final   Lymphocytes Relative 06/18/2019 25  % Final   Lymphs Abs 06/18/2019 1.3  0.7 - 4.0 K/uL Final   Monocytes Relative 06/18/2019 11  % Final   Monocytes Absolute 06/18/2019 0.6  0.1 - 1.0 K/uL Final   Eosinophils Relative 06/18/2019 0  % Final   Eosinophils Absolute 06/18/2019 0.0  0.0 - 0.5 K/uL Final   Basophils Relative 06/18/2019 0  % Final   Basophils Absolute 06/18/2019 0.0  0.0 - 0.1 K/uL Final   Immature Granulocytes 06/18/2019 1  % Final   Abs Immature Granulocytes 06/18/2019 0.03  0.00 - 0.07 K/uL Final   Performed at [facility], [address] [place], [place] [address]    Assessment:  Latoya Jones is a 73 y.o. female with stage IV colon cancerwith peritoneal metastasis and possible liver metastasis. She is s/p exploratory laparotomy with right hemicolectomy and terminal ileum resection and separate small bowel resection on 12/06/2017. 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 Radial margin was positive for invasive carcinoma. There were 2 colon adenomas (2.5 and 1.8 cm) of the ascending colon. Metastatic adenocarcinoma was seen in 4 of 13 lymph nodes. There were 2 tumor deposits. There was a segment of small intestine with abscess which also showed adenocarcinoma invading the distal appendix with perforation. One mesenteric lymph node was negative for malignancy. Retroperitoneal tumor removal was positive for mucinous adenocarcinoma. Proximal and distal margins were negative. Pathologic stagewas [id number]. MSI stable. KRASmutation was positive  She received 5  cycles of FOLFOX(01/10/2018 - 03/27/2018). She began Neulasta with cycle #2. Oxaliplatin was decreased from 85 mg/m2 to 65 mg/m2 with cycle #4. She received 6 cycles of5FU + LV with Avastin(05/01/2018 - 07/17/2018).  She received 10 cycles of FOLIFIRI + Avastin(07/31/2018 - 02/13/2019). She receives chemotherapy every 3 weeks secondary to tolerance. Irinotecan was decreased from 150 mg/m2 to 125 mg/m2 with cycle #2. 5FU bolus was discontinued with cycle #6.  Chest, abdomen, and pelvic CTon 03/02/2019 revealed progressive disease within the abdomen and pelvis. There was increase in soft tissue thickening along the surgical site at the ileocolic junction (2.9 x 1.9 cm to 4.6 x 2.5 cm). There was new and progressive peritoneal metastasis (2.1 x 2.4 cm). There was no bowel obstruction or other acute complication. There was enlarging soft tissue metastasis on the right pelvic oblique musculature (1.1 cm to 1.7 cm).  She isday32ofcycle #3Lonsurf (03/14/2019- 05/21/2019).Cycle #2 was notable for pancytopenia. Shehad grade III anemia andrequired 1 unit of PRBCs on 05/04/2019, 06/05/2019,and07/17/2020.She receives Retacrit(last 06/04/2019).  Chest, abdomen, pelvis CT on 06/14/2019 revealed significant worsening of metastatic disease. There was increasing mesenteric and serosal metastatic disease, extending superiorly through the mesentery, encasing more peripheral branches of the SMA, and extending into the region of the pancreatic head, possibly encasing or invading the pancreatic head. 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 The portal and splenic veins remained patent. Metastatic disease also extended inferiorly, extending along thickened walls of small bowel loops in the left pelvis consistent with serosal metastasis. There was increased attenuation in the fat of the upper abdomen with wall thickening in the region of  the gastric antrum and along much of the duodenum.  The nodule in the right upper lobe had slowly enlarged, c/w metastatic disease. There was worsening metastatic disease in the liver with enlarging capsular implants and at least 2 new hepatic lesions. There was worsening metastatic disease at the ileocecal suture line. The bladder was thick walled raising the possibility of cystitis.   CEAhas been followed: 4.8 on 12/04/2017, 3.1 on 01/10/2018, 4.2 on 03/06/2018, 3.8 on 05/01/2018, 9.2 on 07/31/2018, 5.9 on 09/18/2018, 5.0 on 10/30/2018, 5.0 on 02/13/2019, 7.5 on 03/13/2019, 8.1 on 04/12/2019, 9.0 on 05/07/2019, and 10.7 on 05/17/2019.  She has macrocytic RBC indices. She has a history of alow B12 level. B12 was 266 on 01/30/2018 and 2448 on 01/22/2019. Folatewas 32 on 01/30/2018 and 45 on 01/22/2019. TSHwas 0.913 on 12/03/2017 and 1.627 on 01/22/2019.Ferritinwas 535 on 04/30/2019.  She has paranoid schizophrenia with delusions. She lives in a group home.  Code status is DNR/DNI.  Symptomatically, she has been feeling "weird".  She has been having some anxiety attacks.  She denies any abdominal pain, nausea, vomiting or diarrhea.  Plan: 1.   Labs today: BMP.  2.  Stage IV colon cancer Sheis s/pFOLFOX then 5FU + LV + Avastin. She is s/pFOLFIRI + Avastin She isday32s/pcycle #3Lonsurf. Restaging CT scans revealed progressive disease. Lonsurf was discontinued. Patient has declined regorafenib (Stivarga).             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              Rx: Eliquis 2.5 mg p.o. twice daily   Side effects of blood thinners were reviewed.  Goals of care reviewed.  Patient wishes to pursue supportive care alone. 3.Chemotherapy induced anemia Hemoglobin8.4. MCV94.9 on 06/21/2019. Counts are recovering. No further Retacrit. 4.Hyponatremia Sodium is 132.   Encourage fluids with electrolytes. 5.Code status             DNR/DNI. 6.   RTC in 1 month for MD assessment ans labs (CBC with diff, CMP, CEA).  I discussed the assessment and treatment plan with the patient.  The patient was provided an opportunity to ask questions and all were answered.  The patient agreed with the plan and demonstrated an understanding of the instructions.  The patient was advised to call back if the symptoms worsen or if the condition fails to improve as anticipated.  I provided 24 minutes of face-to-face time during this this encounter and > 50% was spent counseling as documented under my assessment and plan.    Lequita Asal, MD, PhD    06/19/2019, 2:53 PM  I, Selena Batten, am acting as scribe for [employer]. Mike Gip, MD, PhD.  I, Szymon Foiles C. Mike Gip, MD, have reviewed the above documentation for accuracy and completeness, and I agree with the above.

## 2019-06-21 ENCOUNTER — Other Ambulatory Visit: Payer: Medicare Other

## 2019-06-21 ENCOUNTER — Encounter: Payer: Self-pay | Admitting: Hematology and Oncology

## 2019-06-21 ENCOUNTER — Inpatient Hospital Stay: Payer: Medicare Other

## 2019-06-21 ENCOUNTER — Telehealth: Payer: Self-pay

## 2019-06-21 ENCOUNTER — Inpatient Hospital Stay (HOSPITAL_BASED_OUTPATIENT_CLINIC_OR_DEPARTMENT_OTHER): Payer: Medicare Other | Admitting: Hematology and Oncology

## 2019-06-21 ENCOUNTER — Other Ambulatory Visit: Payer: Self-pay

## 2019-06-21 ENCOUNTER — Ambulatory Visit: Payer: Medicare Other | Admitting: Hematology and Oncology

## 2019-06-21 VITALS — BP 111/77 | HR 80 | Temp 98.1°F | Resp 16 | Wt 153.7 lb

## 2019-06-21 DIAGNOSIS — C787 Secondary malignant neoplasm of liver and intrahepatic bile duct: Secondary | ICD-10-CM

## 2019-06-21 DIAGNOSIS — E871 Hypo-osmolality and hyponatremia: Secondary | ICD-10-CM

## 2019-06-21 DIAGNOSIS — C189 Malignant neoplasm of colon, unspecified: Secondary | ICD-10-CM | POA: Diagnosis not present

## 2019-06-21 DIAGNOSIS — D6481 Anemia due to antineoplastic chemotherapy: Secondary | ICD-10-CM

## 2019-06-21 DIAGNOSIS — C786 Secondary malignant neoplasm of retroperitoneum and peritoneum: Secondary | ICD-10-CM

## 2019-06-21 DIAGNOSIS — Z87891 Personal history of nicotine dependence: Secondary | ICD-10-CM

## 2019-06-21 DIAGNOSIS — Z79899 Other long term (current) drug therapy: Secondary | ICD-10-CM

## 2019-06-21 DIAGNOSIS — T451X5A Adverse effect of antineoplastic and immunosuppressive drugs, initial encounter: Secondary | ICD-10-CM

## 2019-06-21 DIAGNOSIS — Z7189 Other specified counseling: Secondary | ICD-10-CM

## 2019-06-21 DIAGNOSIS — C181 Malignant neoplasm of appendix: Secondary | ICD-10-CM | POA: Diagnosis not present

## 2019-06-21 LAB — CBC WITH DIFFERENTIAL/PLATELET
Abs Immature Granulocytes: 0.03 10*3/uL (ref 0.00–0.07)
Basophils Absolute: 0 10*3/uL (ref 0.0–0.1)
Basophils Relative: 1 %
Eosinophils Absolute: 0 10*3/uL (ref 0.0–0.5)
Eosinophils Relative: 0 %
HCT: 25.9 % — ABNORMAL LOW (ref 36.0–46.0)
Hemoglobin: 8.4 g/dL — ABNORMAL LOW (ref 12.0–15.0)
Immature Granulocytes: 1 %
Lymphocytes Relative: 25 %
Lymphs Abs: 1.6 10*3/uL (ref 0.7–4.0)
MCH: 30.8 pg (ref 26.0–34.0)
MCHC: 32.4 g/dL (ref 30.0–36.0)
MCV: 94.9 fL (ref 80.0–100.0)
Monocytes Absolute: 0.6 10*3/uL (ref 0.1–1.0)
Monocytes Relative: 9 %
Neutro Abs: 4 10*3/uL (ref 1.7–7.7)
Neutrophils Relative %: 64 %
Platelets: 153 10*3/uL (ref 150–400)
RBC: 2.73 MIL/uL — ABNORMAL LOW (ref 3.87–5.11)
RDW: 21.4 % — ABNORMAL HIGH (ref 11.5–15.5)
WBC: 6.2 10*3/uL (ref 4.0–10.5)
nRBC: 0 % (ref 0.0–0.2)

## 2019-06-21 LAB — BASIC METABOLIC PANEL
Anion gap: 8 (ref 5–15)
BUN: 7 mg/dL — ABNORMAL LOW (ref 8–23)
CO2: 25 mmol/L (ref 22–32)
Calcium: 9.4 mg/dL (ref 8.9–10.3)
Chloride: 99 mmol/L (ref 98–111)
Creatinine, Ser: 0.47 mg/dL (ref 0.44–1.00)
GFR calc Af Amer: >60 mL/min (ref >60)
GFR calc non Af Amer: >60 mL/min (ref >60)
Glucose, Bld: 123 mg/dL — ABNORMAL HIGH (ref 70–99)
Potassium: 3.7 mmol/L (ref 3.5–5.1)
Sodium: 132 mmol/L — ABNORMAL LOW (ref 135–145)

## 2019-06-21 NOTE — Telephone Encounter (Signed)
Spoke with Ms Latoya Jones to inform  her of Ms Latoya Jones  sodium level . Ms Latoya Jones was understanding.

## 2019-06-21 NOTE — Progress Notes (Signed)
Pt here for follow up. Denies any concerns.  

## 2019-06-21 NOTE — Telephone Encounter (Signed)
-----   Message from Lequita Asal, MD sent at 06/21/2019  4:12 PM EDT ----- Regarding: Please call Ms [name]  Sodium is 132 (slightly better).  Normal is 135-145.  M ----- Message ----- From: Buel Ream, [facility] [place] Sent: 06/21/2019   2:32 PM EDT To: Lequita Asal, MD

## 2019-06-22 MED ORDER — APIXABAN 2.5 MG PO TABS: 2.5000 mg | ORAL_TABLET | Freq: Two times a day (BID) | ORAL | 0 refills | Status: DC

## 2019-06-25 ENCOUNTER — Other Ambulatory Visit: Payer: Self-pay

## 2019-06-25 DIAGNOSIS — C189 Malignant neoplasm of colon, unspecified: Secondary | ICD-10-CM

## 2019-06-26 ENCOUNTER — Telehealth: Payer: Self-pay

## 2019-06-26 IMAGING — CT CT CHEST W/ CM
2 of 3 series · 14 of 30 positions shown, 17 images · IV contrast (iopamidol)
Comparison: CT CAP 04/20/2018

CLINICAL DATA: Patient with history of colon cancer. Follow-up
exam.

EXAM:
CT CHEST, ABDOMEN, AND PELVIS WITH CONTRAST
TECHNIQUE: Multidetector CT imaging of the chest, abdomen and pelvis was
performed following the standard protocol during bolus
administration of intravenous contrast.
CONTRAST:  100mL 3T8BPY-RJJ IOPAMIDOL (3T8BPY-RJJ) INJECTION 61%

[Series 2: cap with · axial · 0.75mm/px · z∈[-1043,-523]mm · 10 of 132 slices shown, 13 images]
[im 14/132  mediastinal]
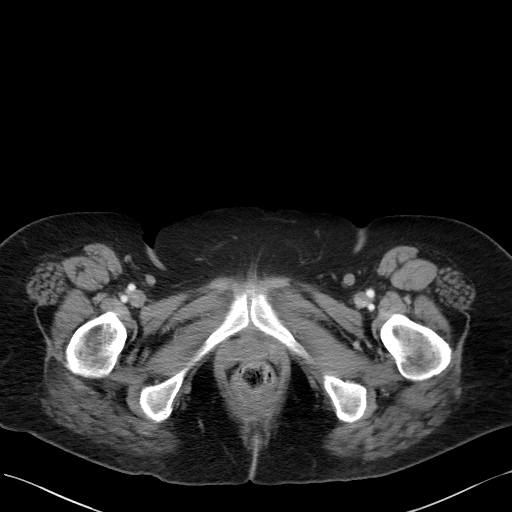
[im 14/132  lung]
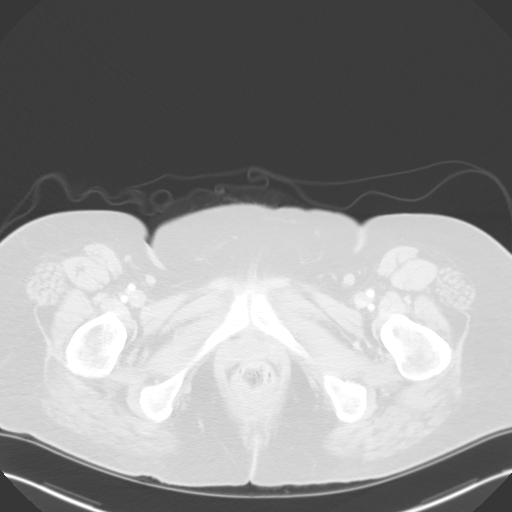
[im 27/132  lung]
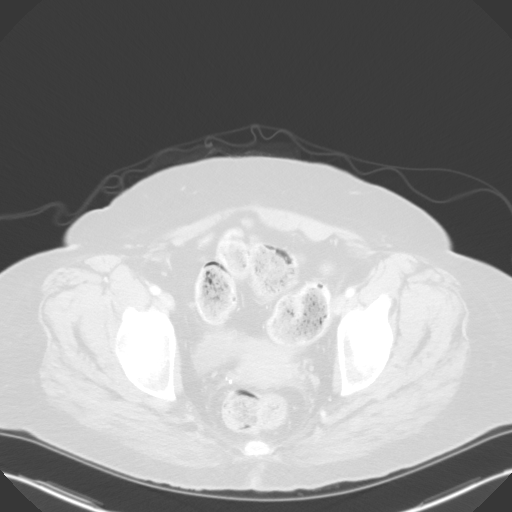
[im 40/132  lung]
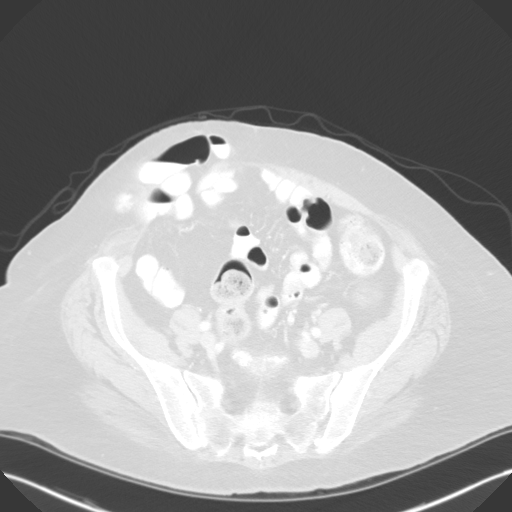
[im 53/132  lung]
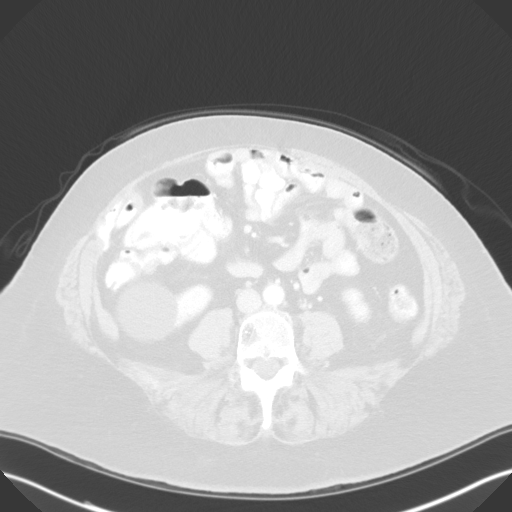
[im 65/132  mediastinal]
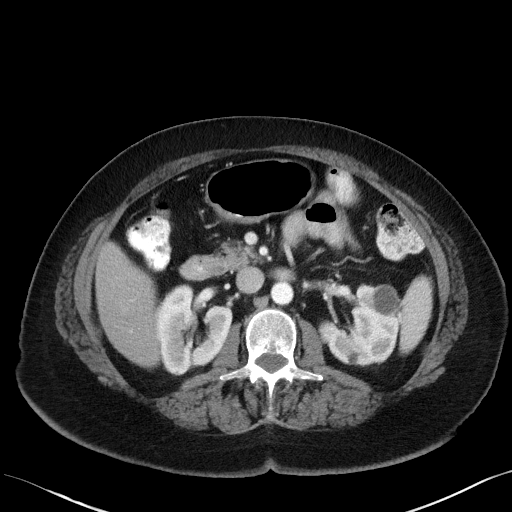
[im 65/132  lung]
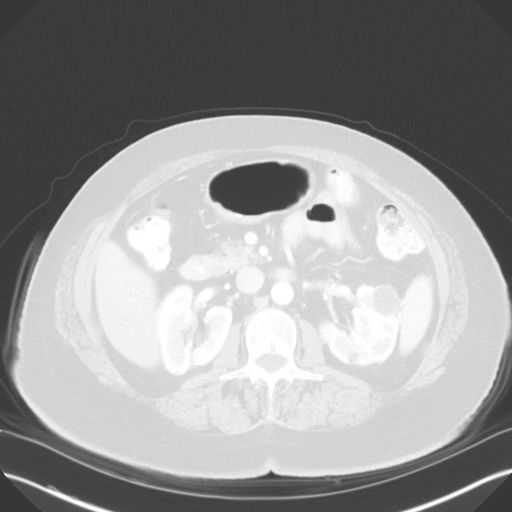
[im 66/132  lung]
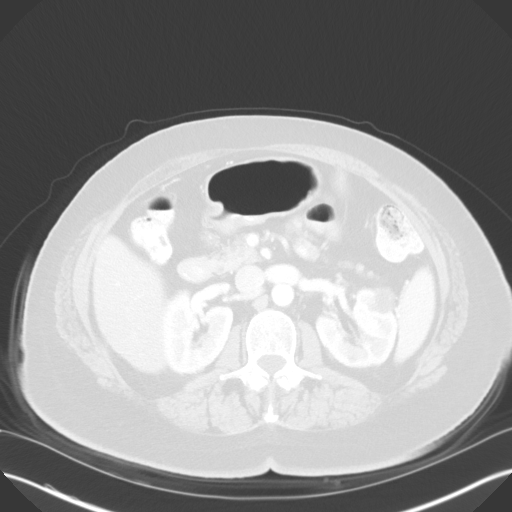
[im 79/132  lung]
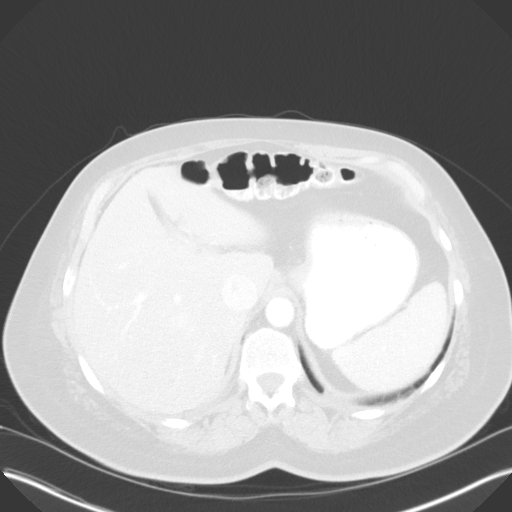
[im 92/132  lung]
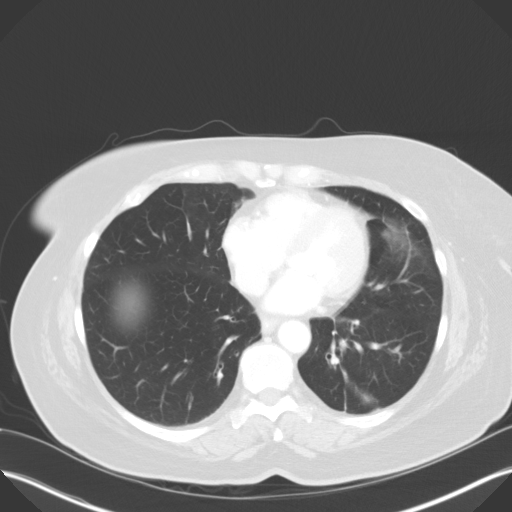
[im 105/132  mediastinal]
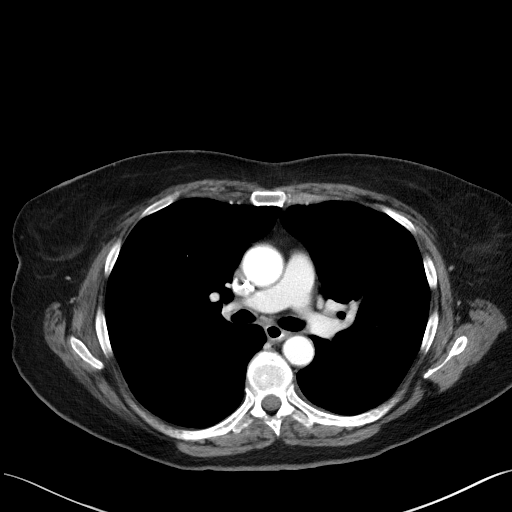
[im 105/132  lung]
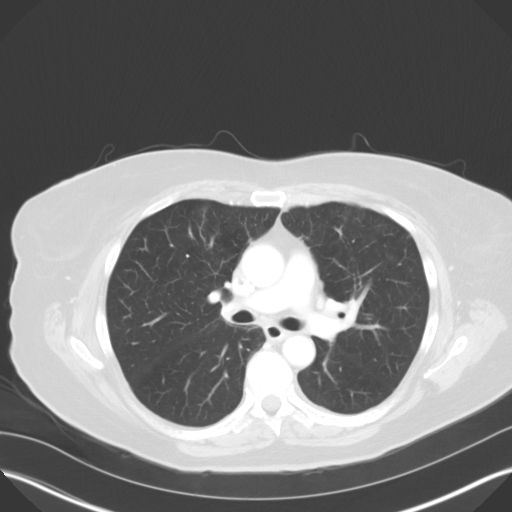
[im 118/132  lung]
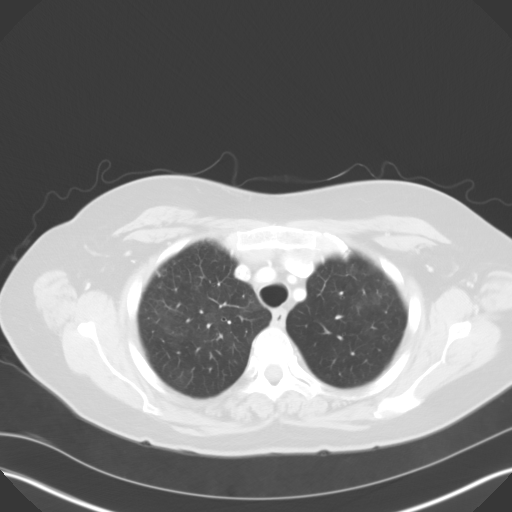

[Series 4: lung · axial · 0.75mm/px · z∈[-735,-633]mm · 4 of 154 slices shown]
[im 13/154  lung]
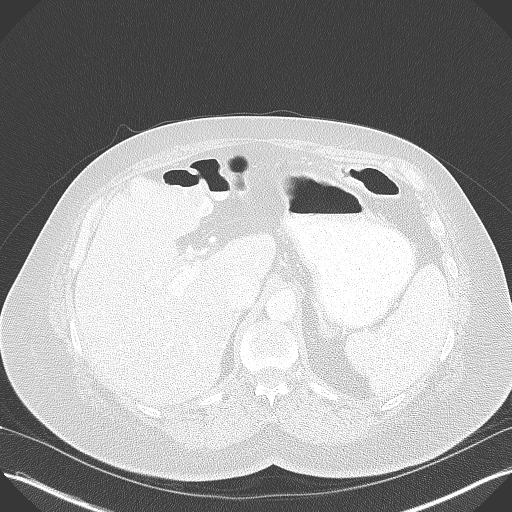
[im 39/154  lung]
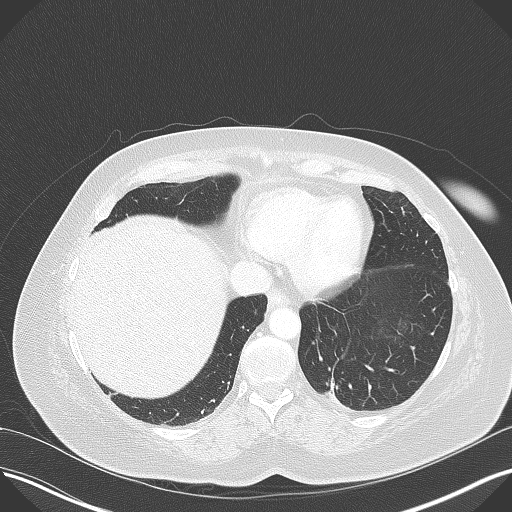
[im 52/154  lung]
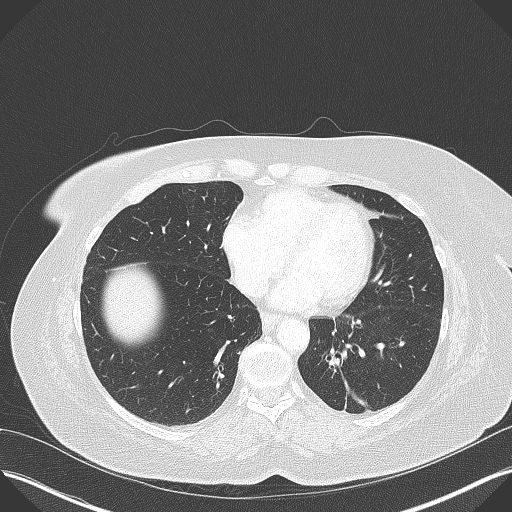
[im 64/154  lung]
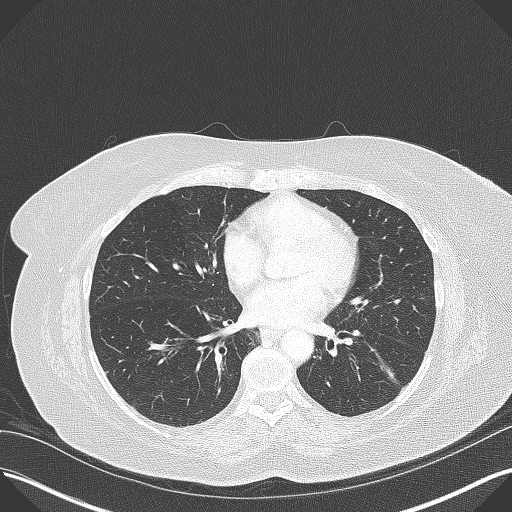

[14 of 30 positions shown; findings below may reference images not displayed]

FINDINGS: CT CHEST FINDINGS

Cardiovascular: Normal heart size. No pericardial effusion. Right
anterior chest wall Port-A-Cath is present with tip terminating in
the superior vena cava.

Mediastinum/Nodes: No enlarged axillary, mediastinal or hilar
lymphadenopathy. Small hiatal hernia.

Lungs/Pleura: Central airways are patent. Interval development of
patchy ground-glass nodularity predominately involving the upper
lobes bilaterally. Bilateral lower lobe bandlike atelectasis. No
pleural effusion or pneumothorax. Stable 4 mm right upper lobe
nodule (image 37; series 4).

Musculoskeletal: No aggressive or acute appearing osseous lesions.
Thoracic spine degenerative changes.

CT ABDOMEN PELVIS FINDINGS

Hepatobiliary: Interval increase in size of 1.8 cm low-attenuation
lesion right hepatic lobe (image 49; series 2), previously 1.3 cm.
There is an additional 0.8 cm lesion within the hepatic dome (image
46; series 2). Re demonstrated vascular anomaly within the right
hepatic lobe (image 55; series 2) most compatible with portal venous
shunt. Gallbladder is decompressed. No intrahepatic or extrahepatic
biliary ductal dilatation.

Pancreas: Unremarkable

Spleen: Unremarkable

Adrenals/Urinary Tract: Stable 1 cm left adrenal nodule (image 61;
series 2). Stable 7 mm right adrenal nodule (image 59; series 2).
Hree demonstrated bilateral renal cysts, largest off the inferior
pole of the right kidney. No hydronephrosis. Urinary bladder is
decompressed.

Stomach/Bowel: Patient status post distal ileal resection and right
hemicolectomy. Similar-appearing 2.5 x 1.9 cm soft tissue mass
involving the distal small bowel (image 98; series 2).
Similar-appearing nodularity involving the peritoneum within the
right pericolic gutter (image 84; series 2). Reference nodule
measures 9 mm, similar to prior (image 84; series 2).
Similar-appearing small nodules within the mesentery (image 91;
series 2). Similar-appearing irregular soft tissue at the
anastomotic suture line (image 85; series 2).

Vascular/Lymphatic: Normal caliber abdominal aorta. Peripheral
calcified atherosclerotic plaque. No retroperitoneal
lymphadenopathy.

Reproductive: Uterus is unremarkable. Interval decrease in soft
tissue within the right adnexa which may represent improving
fluid/postsurgical changes.

Other: Small bowel containing anterior abdominal wall hernia.

Musculoskeletal: Lumbar spine degenerative changes. No aggressive or
acute appearing osseous lesions
IMPRESSION: 1. Interval increase in size of right hepatic lobe lesion concerning
hepatic metastatic disease.
2. Similar-appearing nodularity within the right pericolic gutter
which may represent metastatic disease or postsurgical changes.
3. Similar-appearing soft tissue mass within the distal small bowel
concerning for residual disease.
4. Stable bilateral adrenal nodules. Recommend attention on
follow-up.
5. Interval development of patchy ground-glass opacities
predominantly within the upper lobes bilaterally, favored to be
infectious/inflammatory in etiology. Recommend attention on
follow-up.

## 2019-06-26 MED ORDER — APIXABAN 2.5 MG PO TABS: 2.5000 mg | ORAL_TABLET | Freq: Two times a day (BID) | ORAL | 0 refills | Status: DC

## 2019-06-26 NOTE — Telephone Encounter (Signed)
Started Latoya Jones 2.5 mg 1 tab po BID # 60 with no refills. Latoya Jones was made aware

## 2019-06-27 ENCOUNTER — Telehealth: Payer: Self-pay | Admitting: Hematology and Oncology

## 2019-06-27 NOTE — Telephone Encounter (Signed)
Re:  Eliquis and abdominal pain  Called today regarding patient's abdominal discomfort and no Rx for Eliquis.  Rx sent x 2.  Called contact number [phone]).  No answer.    Message left to return call.   Lequita Asal, MD

## 2019-07-17 NOTE — Progress Notes (Signed)
[facility]  [address] [place], [place] [address] Phone: [phone]  Fax: [phone]   Clinic Day:  07/19/2019  Referring physician: Gae Bon, NP  Chief Complaint: Latoya Jones is a 73 y.o. female with  IV colon cancer who is seen for 1 month assessment.  HPI: The patient was last seen in the medical oncology clinic on 06/21/2019. At that time, she had been feeling "weird". She described some anxiety attacks. She denied any abdominal pain, nausea, vomiting or diarrhea. Hemoglobin was 8.4. MCV was 94.9. Sodium was 132.  Scans revealed progressive disease.  Lonsurf was discontinued.  She declined regorafenib (Stivarga).  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  She was scheduled to begin Eliquis 2.5 mg BID on 06/26/2019.   During the interim, the patient states "I have just been hanging in there". She reports not receiving any Eliquis.  She reports having a good energy level. She notes having heartburn. She takes Tylenol for abdominal pain. She is taking an anti-itch medication. She denies any diarrhea, chest pain, or shortness of breath.   She notes losing her concentration during the visit.   She is eating small meals to prevent chocking. She was recommended to eat small frequent meals x 3 a day.     Past Medical History:  Diagnosis Date  . Colon cancer ([place])   . Hypertension     Past Surgical History:  Procedure Laterality Date  . COLOSTOMY REVISION N/A 12/06/2017   Procedure: COLON RESECTION RIGHT;  Surgeon: Florene Glen, MD;  Location: [facility] ORS;  Service: General;  Laterality: N/A;  . DILATION AND CURETTAGE, DIAGNOSTIC / THERAPEUTIC    . PORTACATH PLACEMENT N/A 01/06/2018   Procedure: INSERTION PORT-A-CATH;  Surgeon: Vickie Epley, MD;  Location: [facility] ORS;  Service: General;  Laterality: N/A;    Family History  Problem Relation Age of Onset  . ALS Mother   . ALS Father      Social History:  reports that she quit smoking about 11 years ago. She has never used smokeless tobacco. She reports that she does not drink alcohol or use drugs. She lives in a family care home; generally accompanied by Latoya Jones [phone]).  [name]'s son is Publishing copy. The patient is accompanied by Latoya Jones, [name]'s son via phone [phone]) today.   Allergies: No Known Allergies  Current Medications: Current Outpatient Medications  Medication Sig Dispense Refill  . acetaminophen (TYLENOL) 500 MG tablet Take 1,000 mg by mouth every 8 (eight) hours as needed for mild pain.    Marland Kitchen alendronate (FOSAMAX) 70 MG tablet Take 70 mg by mouth once a week.    Marland Kitchen amLODipine (NORVASC) 10 MG tablet Take 10 mg by mouth daily.     . ARIPiprazole (ABILIFY) 15 MG tablet Take 7.5 mg by mouth daily.    Marland Kitchen atorvastatin (LIPITOR) 20 MG tablet Take 1 tablet by mouth daily.    . calcium carbonate (TUMS - DOSED IN MG ELEMENTAL CALCIUM) 500 MG chewable tablet Chew 1 tablet by mouth 2 (two) times daily as needed for indigestion or heartburn.    . Calcium Carbonate-Vitamin D3 (CALCIUM 600-D) 600-400 MG-UNIT TABS Take 1 tablet by mouth daily.    Marland Kitchen loratadine (CLARITIN) 10 MG tablet Take 1 tablet (10 mg total) by mouth daily as needed for allergies. Take 1 tablet the day before pump removal, 1 tablet the day of pump removal and 1 tablet the  day after pump removal. Take as directed with each cycle of  chemotherapy treatment 30 tablet 1  . memantine (NAMENDA XR) 14 MG CP24 24 hr capsule Take 14 mg by mouth at bedtime.     . metoprolol succinate (TOPROL-XL) 50 MG 24 hr tablet Take 25 mg by mouth every morning. Take with or immediately following a meal.     . Multiple Vitamins-Minerals (SENTRY SENIOR PO) Take 1 tablet by mouth daily.    Marland Kitchen perphenazine (TRILAFON) 4 MG tablet Take 4 mg by mouth 2 (two) times daily.    Marland Kitchen apixaban (ELIQUIS) 2.5 MG TABS tablet Take 1 tablet (2.5 mg total) by mouth 2 (two)  times daily. 60 tablet 0  . lidocaine-prilocaine (EMLA) cream Apply 1 application topically as needed. 1 hour prior to  each chemotherapy treatment. Place small amount over port site and place saran wrap over the cream to protect clothing (Patient not taking: Reported on 06/04/2019) 30 g 0  . ondansetron (ZOFRAN) 8 MG tablet Take 1 tablet (8 mg total) by mouth 2 (two) times daily as needed for nausea or vomiting. (Patient not taking: Reported on 06/04/2019) 20 tablet 0  . prochlorperazine (COMPAZINE) 10 MG tablet Take 1 tablet (10 mg total) by mouth every 6 (six) hours as needed for nausea or vomiting. (Patient not taking: Reported on 06/04/2019) 30 tablet 0  . trifluridine-tipiracil (LONSURF) 20-8.19 MG tablet Take 3 tablets (60 mg of trifluridine total) by mouth 2 (two) times daily after a meal. Take on days 1-5,8-12. Repeat every 28days (Patient not taking: Reported on 06/11/2019) 60 tablet 0   No current facility-administered medications for this visit.    Facility-Administered Medications Ordered in Other Visits  Medication Dose Route Frequency Provider Last Rate Last Dose  . heparin lock flush 100 unit/mL  500 Units Intravenous Once Sindy Guadeloupe, MD      . heparin lock flush 100 unit/mL  500 Units Intravenous Once Sindy Guadeloupe, MD      . sodium chloride flush (NS) 0.9 % injection 10 mL  10 mL Intravenous PRN Sindy Guadeloupe, MD   10 mL at 02/06/18 0845  . sodium chloride flush (NS) 0.9 % injection 10 mL  10 mL Intravenous PRN Sindy Guadeloupe, MD   10 mL at 03/27/18 0909    Review of Systems  Constitutional: Positive for weight loss (5 lbs). Negative for chills, diaphoresis, fever and malaise/fatigue.       "I have just been hanging in there". Good energy level.  HENT: Negative.  Negative for congestion, ear pain, hearing loss, nosebleeds, sinus pain and sore throat.   Eyes: Negative.  Negative for blurred vision, double vision, photophobia and pain.  Respiratory: Negative.  Negative for  cough, shortness of breath and wheezing.   Cardiovascular: Negative.  Negative for chest pain, palpitations, orthopnea, leg swelling and PND.       H/o asymptomatic bradycardia.    Gastrointestinal: Positive for abdominal pain (taking Tylenol) and heartburn. Negative for blood in stool, constipation, diarrhea, melena, nausea and vomiting.       Eating small meals.  Genitourinary: Negative.  Negative for dysuria, frequency, hematuria and urgency.  Musculoskeletal: Negative.  Negative for back pain, joint pain and myalgias.  Skin: Positive for itching (bottom, anti-itch medication). Negative for rash.  Neurological: Negative.  Negative for dizziness, tingling, sensory change, weakness and headaches.  Endo/Heme/Allergies: Negative.  Does not bruise/bleed easily.  Psychiatric/Behavioral: Positive for memory loss. Negative for depression and substance abuse.  The patient is not nervous/anxious and does not have insomnia.        Schizophrenia.   All other systems reviewed and are negative.  Performance status (ECOG): 1-2  Vitals Blood pressure 118/69, pulse 61, temperature (!) 97.1 F (36.2 C), temperature source Tympanic, resp. rate 18, height '5\' 5"'  (1.651 m), weight 148 lb 0.6 oz (67.1 kg), SpO2 99 %.   Physical Exam  Constitutional: She is oriented to person, place, and time. She appears well-developed and well-nourished. No distress.  HENT:  Head: Normocephalic and atraumatic.  Mouth/Throat: Oropharynx is clear and moist. No oropharyngeal exudate.  Short thinning gray hair.  Mask and black cap.   Eyes: Pupils are equal, round, and reactive to light. Conjunctivae and EOM are normal. No scleral icterus.  Glasses.  Blue eyes.    Neck: Normal range of motion. Neck supple.  Cardiovascular: Normal rate, regular rhythm and normal heart sounds.  No murmur heard. Pulmonary/Chest: Effort normal and breath sounds normal. No respiratory distress. She has no wheezes. She has no rales.  Abdominal:  Soft. Bowel sounds are normal. She exhibits no distension and no mass. There is no rebound and no guarding.  Palpable peritoneal nodularity.  Musculoskeletal: Normal range of motion.        General: Edema (trace ankles) present.  Lymphadenopathy:    She has no cervical adenopathy.    She has no axillary adenopathy.       Right: No supraclavicular adenopathy present.       Left: No supraclavicular adenopathy present.  Neurological: She is alert and oriented to person, place, and time.  Skin: Skin is warm and dry. No rash noted. She is not diaphoretic. No erythema. There is pallor.  Psychiatric: She has a normal mood and affect. Her behavior is normal. Judgment and thought content normal.  Schizophrenia.  Nursing note and vitals reviewed.   Appointment on 07/19/2019  Component Date Value Ref Range Status  . Sodium 07/19/2019 136  135 - 145 mmol/L Final  . Potassium 07/19/2019 4.1  3.5 - 5.1 mmol/L Final  . Chloride 07/19/2019 102  98 - 111 mmol/L Final  . CO2 07/19/2019 25  22 - 32 mmol/L Final  . Glucose, Bld 07/19/2019 112* 70 - 99 mg/dL Final  . BUN 07/19/2019 11  8 - 23 mg/dL Final  . Creatinine, Ser 07/19/2019 0.69  0.44 - 1.00 mg/dL Final  . Calcium 07/19/2019 9.4  8.9 - 10.3 mg/dL Final  . Total Protein 07/19/2019 6.7  6.5 - 8.1 g/dL Final  . Albumin 07/19/2019 3.4* 3.5 - 5.0 g/dL Final  . AST 07/19/2019 21  15 - 41 U/L Final  . ALT 07/19/2019 14  0 - 44 U/L Final  . Alkaline Phosphatase 07/19/2019 79  38 - 126 U/L Final  . Total Bilirubin 07/19/2019 0.6  0.3 - 1.2 mg/dL Final  . GFR calc non Af Amer 07/19/2019 >60  >60 mL/min Final  . GFR calc Af Amer 07/19/2019 >60  >60 mL/min Final  . Anion gap 07/19/2019 9  5 - 15 Final   Performed at [facility], [address] [place], [place] [address]  . WBC 07/19/2019 6.3  4.0 - 10.5 K/uL Final  . RBC 07/19/2019 3.03* 3.87 - 5.11 MIL/uL Final  . Hemoglobin 07/19/2019 9.5* 12.0 - 15.0 g/dL Final  . HCT 07/19/2019  29.6* 36.0 - 46.0 % Final  . MCV 07/19/2019 97.7  80.0 - 100.0 fL Final  . MCH 07/19/2019 31.4  26.0 - 34.0 pg Final  . MCHC 07/19/2019 32.1  30.0 - 36.0 g/dL Final  . RDW 07/19/2019 17.8* 11.5 - 15.5 % Final  . Platelets 07/19/2019 144* 150 - 400 K/uL Final  . nRBC 07/19/2019 0.0  0.0 - 0.2 % Final  . Neutrophils Relative % 07/19/2019 64  % Final  . Neutro Abs 07/19/2019 4.0  1.7 - 7.7 K/uL Final  . Lymphocytes Relative 07/19/2019 21  % Final  . Lymphs Abs 07/19/2019 1.3  0.7 - 4.0 K/uL Final  . Monocytes Relative 07/19/2019 9  % Final  . Monocytes Absolute 07/19/2019 0.6  0.1 - 1.0 K/uL Final  . Eosinophils Relative 07/19/2019 5  % Final  . Eosinophils Absolute 07/19/2019 0.3  0.0 - 0.5 K/uL Final  . Basophils Relative 07/19/2019 1  % Final  . Basophils Absolute 07/19/2019 0.1  0.0 - 0.1 K/uL Final  . Immature Granulocytes 07/19/2019 0  % Final  . Abs Immature Granulocytes 07/19/2019 0.02  0.00 - 0.07 K/uL Final   Performed at [facility], [address], [place],  [address]    Assessment:  Dalma Panchal is a 73 y.o. female with stage IV colon cancerwith peritoneal metastasis and possible liver metastasis. She is s/p exploratory laparotomy with right hemicolectomy and terminal ileum resection and separate small bowel resection on 12/06/2017. 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 Radial margin was positive for invasive carcinoma. There were 2 colon adenomas (2.5 and 1.8 cm) of the ascending colon. Metastatic adenocarcinoma was seen in 4 of 13 lymph nodes. There were 2 tumor deposits. There was a segment of small intestine with abscess which also showed adenocarcinoma invading the distal appendix with perforation. One mesenteric lymph node was negative for malignancy. Retroperitoneal tumor removal was positive for mucinous adenocarcinoma. Proximal and distal margins were negative. Pathologic  stagewas [id number]. MSI stable. KRASmutation was positive  She received 5 cycles of FOLFOX(01/10/2018 - 03/27/2018). She began Neulasta with cycle #2. Oxaliplatin was decreased from 85 mg/m2 to 65 mg/m2 with cycle #4. She received 6 cycles of5FU + LV with Avastin(05/01/2018 - 07/17/2018).  She received 10 cycles of FOLIFIRI + Avastin(07/31/2018 - 02/13/2019). She receives chemotherapy every 3 weeks secondary to tolerance. Irinotecan was decreased from 150 mg/m2 to 125 mg/m2 with cycle #2. 5FU bolus was discontinued with cycle #6.  Chest, abdomen, and pelvic CTon 03/02/2019 revealed progressive disease within the abdomen and pelvis. There was increase in soft tissue thickening along the surgical site at the ileocolic junction (2.9 x 1.9 cm to 4.6 x 2.5 cm). There was new and progressive peritoneal metastasis (2.1 x 2.4 cm). There was no bowel obstruction or other acute complication. There was enlarging soft tissue metastasis on the right pelvic oblique musculature (1.1 cm to 1.7 cm).  She isday34ofcycle #3Lonsurf (03/14/2019- 05/21/2019).Cycle #2 was notable for pancytopenia. Shehad grade III anemia andrequired 1 unit of PRBCs on 05/04/2019,06/05/2019,and07/17/2020.She receives Retacrit(last 06/04/2019).  Chest, abdomen, pelvisCTon 06/14/2019 revealedsignificant worsening of metastatic disease. There was increasing mesenteric and serosal metastatic disease,extendingsuperiorly through the mesentery, encasing more peripheral branches of the SMA, and extending into the region of the pancreatic head, possibly encasing or invading the pancreatic head.Metastatic disease completely occludedthe superior mesenteric vein, just inferior to the confluence. The portal and splenic veins remainedpatent. Metastatic disease also extendedinferiorly, extending along thickened walls of small bowel loops in the left pelvis consistent with serosal metastasis. There was increased  attenuation in the fat of the  upper abdomen with wall thickening in the region of the gastric antrum and along much of the duodenum. The nodule in the right upper lobe hadslowly enlarged, c/wmetastatic disease. There was worsening metastatic disease in the liver with enlarging capsularimplants and at least 2 new hepatic lesions.There was worsening metastatic disease at the ileocecal suture line. The bladderwasthick walled raising the possibility of cystitis.   CEAhas been followed: 4.8 on 12/04/2017, 3.1 on 01/10/2018, 4.2 on 03/06/2018, 3.8 on 05/01/2018, 9.2 on 07/31/2018, 5.9 on 09/18/2018, 5.0 on 10/30/2018, 5.0 on 02/13/2019, 7.5 on 03/13/2019, 8.1 on 04/12/2019, 9.0 on 05/07/2019, and 10.7 on 05/17/2019.  She has macrocytic RBC indices. She has a history of alow B12 level. B12 was 266 on 01/30/2018 and 2448 on 01/22/2019. Folatewas 32 on 01/30/2018 and 45 on 01/22/2019. TSHwas 0.913 on 12/03/2017 and 1.627 on 01/22/2019.Ferritinwas 535 on 04/30/2019.  She has paranoid schizophrenia with delusions. She lives in a group home.Codestatusis DNR/DNI.  Symptomatically, she notes intermittent abdominal pain managed with Tylenol.  She has vomiting only if she eats too much (chronic).  Exam reveals peritoneal nodularity.  Plan: 1.   Labs today:  CBC with diff, CMP, CEA. 2.   Stage IV colon cancer Sheis s/pFOLFOX then 5FU + LV + Avastin. She is s/pFOLFIRI + Avastin She iss/p 3 cycles ofLonsurf. CT scans on 06/14/2019 revealed progressive disease. Patient declined regorafenib (Stivarga). Imaging studies showed occlusion of the superior mesenteric vein   Per patient, she has not yet received Eliquis.  Confirm if patient has been receiving.             Rx: Eliquis 2.5 mg p.o. twice daily.  Continue supportive care. 3.   Chemotherapy induced anemia Hematocrit 29.6.  Hemoglobin9.5. MCV97.7. Continue to monitor. 4.Hyponatremia,  normal Sodium was 132 on 06/21/2019 and 136 today.  Continue to monitor. 5.Code status DNR/DNI. 6.RTC in 1 month for MD assessment and labs (CBC with diff, CMP, CEA).  I discussed the assessment and treatment plan with the patient.  The patient was provided an opportunity to ask questions and all were answered.  The patient agreed with the plan and demonstrated an understanding of the instructions.  The patient was advised to call back if the symptoms worsen or if the condition fails to improve as anticipated.  I provided 15 minutes of face-to-face time during this this encounter and > 50% was spent counseling as documented under my assessment and plan.    Lequita Asal, MD, PhD    07/19/2019, 10:46 AM  I, Selena Batten, am acting as scribe for [employer]. Mike Gip, MD, PhD.  I,  C. Mike Gip, MD, have reviewed the above documentation for accuracy and completeness, and I agree with the above.

## 2019-07-18 ENCOUNTER — Other Ambulatory Visit: Payer: Self-pay

## 2019-07-18 DIAGNOSIS — C189 Malignant neoplasm of colon, unspecified: Secondary | ICD-10-CM

## 2019-07-18 MED ORDER — APIXABAN 2.5 MG PO TABS: 2.5000 mg | ORAL_TABLET | Freq: Two times a day (BID) | ORAL | 0 refills | Status: DC

## 2019-07-18 NOTE — Progress Notes (Signed)
Confirmed Name, DOB, and Address. Assessment completed with help from caregiver, [name]. Patient has not started Eliquis as [name] reports pharmacy states they have not received it. Denies any concerns.

## 2019-07-19 ENCOUNTER — Encounter: Payer: Self-pay | Admitting: Hematology and Oncology

## 2019-07-19 ENCOUNTER — Other Ambulatory Visit: Payer: Self-pay

## 2019-07-19 ENCOUNTER — Inpatient Hospital Stay: Payer: Medicare Other

## 2019-07-19 ENCOUNTER — Inpatient Hospital Stay: Payer: Medicare Other | Attending: Hematology and Oncology | Admitting: Hematology and Oncology

## 2019-07-19 VITALS — BP 118/69 | HR 61 | Temp 97.1°F | Resp 18 | Ht 65.0 in | Wt 148.0 lb

## 2019-07-19 DIAGNOSIS — Z7189 Other specified counseling: Secondary | ICD-10-CM

## 2019-07-19 DIAGNOSIS — E871 Hypo-osmolality and hyponatremia: Secondary | ICD-10-CM

## 2019-07-19 DIAGNOSIS — Z66 Do not resuscitate: Secondary | ICD-10-CM | POA: Insufficient documentation

## 2019-07-19 DIAGNOSIS — C180 Malignant neoplasm of cecum: Secondary | ICD-10-CM | POA: Insufficient documentation

## 2019-07-19 DIAGNOSIS — C786 Secondary malignant neoplasm of retroperitoneum and peritoneum: Secondary | ICD-10-CM | POA: Insufficient documentation

## 2019-07-19 DIAGNOSIS — D6481 Anemia due to antineoplastic chemotherapy: Secondary | ICD-10-CM

## 2019-07-19 DIAGNOSIS — C787 Secondary malignant neoplasm of liver and intrahepatic bile duct: Secondary | ICD-10-CM | POA: Insufficient documentation

## 2019-07-19 DIAGNOSIS — Z9221 Personal history of antineoplastic chemotherapy: Secondary | ICD-10-CM | POA: Insufficient documentation

## 2019-07-19 DIAGNOSIS — T451X5A Adverse effect of antineoplastic and immunosuppressive drugs, initial encounter: Secondary | ICD-10-CM

## 2019-07-19 DIAGNOSIS — C189 Malignant neoplasm of colon, unspecified: Secondary | ICD-10-CM

## 2019-07-19 DIAGNOSIS — F200 Paranoid schizophrenia: Secondary | ICD-10-CM | POA: Diagnosis not present

## 2019-07-19 DIAGNOSIS — C181 Malignant neoplasm of appendix: Secondary | ICD-10-CM | POA: Diagnosis not present

## 2019-07-19 LAB — CBC WITH DIFFERENTIAL/PLATELET
Abs Immature Granulocytes: 0.02 10*3/uL (ref 0.00–0.07)
Basophils Absolute: 0.1 10*3/uL (ref 0.0–0.1)
Basophils Relative: 1 %
Eosinophils Absolute: 0.3 10*3/uL (ref 0.0–0.5)
Eosinophils Relative: 5 %
HCT: 29.6 % — ABNORMAL LOW (ref 36.0–46.0)
Hemoglobin: 9.5 g/dL — ABNORMAL LOW (ref 12.0–15.0)
Immature Granulocytes: 0 %
Lymphocytes Relative: 21 %
Lymphs Abs: 1.3 10*3/uL (ref 0.7–4.0)
MCH: 31.4 pg (ref 26.0–34.0)
MCHC: 32.1 g/dL (ref 30.0–36.0)
MCV: 97.7 fL (ref 80.0–100.0)
Monocytes Absolute: 0.6 10*3/uL (ref 0.1–1.0)
Monocytes Relative: 9 %
Neutro Abs: 4 10*3/uL (ref 1.7–7.7)
Neutrophils Relative %: 64 %
Platelets: 144 10*3/uL — ABNORMAL LOW (ref 150–400)
RBC: 3.03 MIL/uL — ABNORMAL LOW (ref 3.87–5.11)
RDW: 17.8 % — ABNORMAL HIGH (ref 11.5–15.5)
WBC: 6.3 10*3/uL (ref 4.0–10.5)
nRBC: 0 % (ref 0.0–0.2)

## 2019-07-19 LAB — COMPREHENSIVE METABOLIC PANEL
ALT: 14 U/L (ref 0–44)
AST: 21 U/L (ref 15–41)
Albumin: 3.4 g/dL — ABNORMAL LOW (ref 3.5–5.0)
Alkaline Phosphatase: 79 U/L (ref 38–126)
Anion gap: 9 (ref 5–15)
BUN: 11 mg/dL (ref 8–23)
CO2: 25 mmol/L (ref 22–32)
Calcium: 9.4 mg/dL (ref 8.9–10.3)
Chloride: 102 mmol/L (ref 98–111)
Creatinine, Ser: 0.69 mg/dL (ref 0.44–1.00)
GFR calc Af Amer: >60 mL/min (ref >60)
GFR calc non Af Amer: >60 mL/min (ref >60)
Glucose, Bld: 112 mg/dL — ABNORMAL HIGH (ref 70–99)
Potassium: 4.1 mmol/L (ref 3.5–5.1)
Sodium: 136 mmol/L (ref 135–145)
Total Bilirubin: 0.6 mg/dL (ref 0.3–1.2)
Total Protein: 6.7 g/dL (ref 6.5–8.1)

## 2019-07-19 NOTE — Progress Notes (Signed)
The patient was able to verifiy her DOB. Today. The patient states no new changes. I have rerouted the Eliquis in a encounter .

## 2019-07-20 LAB — CEA: CEA: 14.5 ng/mL — ABNORMAL HIGH (ref 0.0–4.7)

## 2019-07-26 ENCOUNTER — Telehealth: Payer: Self-pay

## 2019-07-26 MED ORDER — APIXABAN 2.5 MG PO TABS: 2.5000 mg | ORAL_TABLET | Freq: Two times a day (BID) | ORAL | 0 refills | Status: DC

## 2019-07-26 NOTE — Telephone Encounter (Signed)
spoke with the pharmacy at [facility]. Per pharmacy they never received the script 2 months ago. They did take a verbal for the Eliquis 2.5 mg 1 tab po BID the medication will be sent out to day for the patient to start. Medication was E prescribe but never went through to the pharmacy. All medication need to be paper script and sent with the patient to her facility. No one is able to give any information on the patient.

## 2019-07-26 NOTE — Telephone Encounter (Signed)
I have reached out to several numbers trying to find answer about Latoya Jones . I have not gotten a call back form the home she is currently in. I have also reached out to [place] and haven't gotten a call back at this time yet.  [place] [phone]) . I will reach out after lunch to see if some one have any answers yet. Latoya Jones is currently sick and im  not able to get in touch with her either.

## 2019-07-31 ENCOUNTER — Other Ambulatory Visit: Payer: Self-pay | Admitting: Hematology and Oncology

## 2019-07-31 ENCOUNTER — Telehealth: Payer: Self-pay

## 2019-07-31 DIAGNOSIS — C189 Malignant neoplasm of colon, unspecified: Secondary | ICD-10-CM

## 2019-07-31 NOTE — Telephone Encounter (Signed)
Refill Eliquis 2.5 mg 1 tab po BID # 12 with 4 refills. I have notified the facility.

## 2019-08-14 NOTE — Progress Notes (Signed)
[facility]  [address], [address] [place], [place] [address] Phone: [phone]  Fax: [phone]   Clinic Day:  08/16/2019  Referring physician: Gae Bon, NP  Chief Complaint: Latoya Jones is a 73 y.o. female with IV colon cancer who is seen for a 1 month assessment.  HPI: The patient was last seen in the medical oncology clinic on 07/19/2019. At that time, she noted intermittent abdominal pain managed with Tylenol. She had vomiting only if she eats too much (chronic). Exam revealed peritoneal nodularity. Hematocrit 29.6 and hemoglobin 9.5. MCV was 97.7. Sodium was 136.   Patient started Eliquis 2.5 mg BID about a month ago.  She denies any bruising or bleeding.  During the interim, the patient has been doing "ok".  She describes "a little" flank pain. She took Tylenol for pain. She has occasional diarrhea. She denies any constipation.  Barrett, her new caregiver, reports the patient's appetite fluctuates. She has nausea and vomiting when "cramming food in her mouth".  Barrett reports he has to tell her to eat slower. She notes that when she eats all of her breakfast and lunch, she can barely eat her dinner.  Barrett reports some days she is anxious. The patient reports she was sad yesterday because she is worried about her family and she misses them.   Past Medical History:  Diagnosis Date   Colon cancer ([place])    Hypertension     Past Surgical History:  Procedure Laterality Date   COLOSTOMY REVISION N/A 12/06/2017   Procedure: COLON RESECTION RIGHT;  Surgeon: Florene Glen, MD;  Location: [facility] ORS;  Service: General;  Laterality: N/A;   DILATION AND CURETTAGE, DIAGNOSTIC / THERAPEUTIC     PORTACATH PLACEMENT N/A 01/06/2018   Procedure: INSERTION PORT-A-CATH;  Surgeon: Vickie Epley, MD;  Location: [facility] ORS;  Service: General;  Laterality: N/A;    Family History  Problem Relation Age of Onset   ALS Mother    ALS Father      Social History:  reports that she quit smoking about 11 years ago. She has never used smokeless tobacco. She reports that she does not drink alcohol or use drugs. She lives in a family care home; generally accompanied by [name] [phone]).  [name]'s son is [name] is her new caregiver. The patient isaccompanied by [name], [name]'s son via phone([phone]) today.   Allergies: No Known Allergies  Current Medications: Current Outpatient Medications  Medication Sig Dispense Refill   alendronate (FOSAMAX) 70 MG tablet Take 70 mg by mouth once a week.     amLODipine (NORVASC) 10 MG tablet Take 10 mg by mouth daily.      ARIPiprazole (ABILIFY) 15 MG tablet Take 7.5 mg by mouth daily.     atorvastatin (LIPITOR) 20 MG tablet Take 1 tablet by mouth daily.     calcium carbonate (TUMS - DOSED IN MG ELEMENTAL CALCIUM) 500 MG chewable tablet Chew 1 tablet by mouth 2 (two) times daily as needed for indigestion or heartburn.     Calcium Carbonate-Vitamin D3 (CALCIUM 600-D) 600-400 MG-UNIT TABS Take 1 tablet by mouth daily.     ELIQUIS 2.5 MG TABS tablet TAKE 1 TABLET BY MOUTH TWICE DAILY 12 tablet 4   loratadine (CLARITIN) 10 MG tablet Take 1 tablet (10 mg total) by mouth daily as needed for allergies. Take 1 tablet the day before pump removal, 1 tablet the day of pump removal and 1 tablet the day after pump  removal. Take as directed with each cycle of  chemotherapy treatment 30 tablet 1   memantine (NAMENDA XR) 14 MG CP24 24 hr capsule Take 14 mg by mouth at bedtime.      metoprolol succinate (TOPROL-XL) 50 MG 24 hr tablet Take 25 mg by mouth every morning. Take with or immediately following a meal.      Multiple Vitamins-Minerals (SENTRY SENIOR PO) Take 1 tablet by mouth daily.     perphenazine (TRILAFON) 4 MG tablet Take 4 mg by mouth 2 (two) times daily.     acetaminophen (TYLENOL) 500 MG tablet Take 1,000 mg by mouth every 8 (eight) hours as needed for mild  pain.     lidocaine-prilocaine (EMLA) cream Apply 1 application topically as needed. 1 hour prior to  each chemotherapy treatment. Place small amount over port site and place saran wrap over the cream to protect clothing (Patient not taking: Reported on 06/04/2019) 30 g 0   ondansetron (ZOFRAN) 8 MG tablet Take 1 tablet (8 mg total) by mouth 2 (two) times daily as needed for nausea or vomiting. (Patient not taking: Reported on 06/04/2019) 20 tablet 0   prochlorperazine (COMPAZINE) 10 MG tablet Take 1 tablet (10 mg total) by mouth every 6 (six) hours as needed for nausea or vomiting. (Patient not taking: Reported on 06/04/2019) 30 tablet 0   trifluridine-tipiracil (LONSURF) 20-8.19 MG tablet Take 3 tablets (60 mg of trifluridine total) by mouth 2 (two) times daily after a meal. Take on days 1-5,8-12. Repeat every 28days (Patient not taking: Reported on 06/11/2019) 60 tablet 0   No current facility-administered medications for this visit.    Facility-Administered Medications Ordered in Other Visits  Medication Dose Route Frequency Provider Last Rate Last Dose   heparin lock flush 100 unit/mL  500 Units Intravenous Once Sindy Guadeloupe, MD       heparin lock flush 100 unit/mL  500 Units Intravenous Once Sindy Guadeloupe, MD       sodium chloride flush (NS) 0.9 % injection 10 mL  10 mL Intravenous PRN Sindy Guadeloupe, MD   10 mL at 02/06/18 0845   sodium chloride flush (NS) 0.9 % injection 10 mL  10 mL Intravenous PRN Sindy Guadeloupe, MD   10 mL at 03/27/18 0909   sodium chloride flush (NS) 0.9 % injection 10 mL  10 mL Intravenous PRN Lequita Asal, MD   10 mL at 08/16/19 1358    Review of Systems  Constitutional: Negative for chills, diaphoresis, fever, malaise/fatigue and weight loss (up 2 pounds).       Doing "ok".  HENT: Negative.  Negative for congestion, ear pain, hearing loss, nosebleeds, sinus pain and sore throat.   Eyes: Negative.  Negative for blurred vision, double vision,  photophobia and pain.  Respiratory: Negative.  Negative for cough, hemoptysis, sputum production, shortness of breath and wheezing.   Cardiovascular: Negative.  Negative for chest pain, palpitations, orthopnea, leg swelling and PND.       H/o asymptomatic bradycardia.    Gastrointestinal: Positive for abdominal pain (left sided; resolved by Tylenol), diarrhea (occasional), nausea (after eating too much) and vomiting (after eating too much). Negative for blood in stool, constipation, heartburn and melena.       Fluctuating appetite. Small table spoon amount of hemorrhaging.  Genitourinary: Negative.  Negative for dysuria, flank pain, frequency, hematuria and urgency.  Musculoskeletal: Negative.  Negative for back pain, falls, joint pain, myalgias and neck pain.  Skin: Negative.  Negative for itching and rash.  Neurological: Negative.  Negative for dizziness, tingling, sensory change, speech change, focal weakness, weakness and headaches.  Endo/Heme/Allergies: Negative.  Does not bruise/bleed easily.  Psychiatric/Behavioral: Positive for memory loss. Negative for depression and substance abuse. The patient is not nervous/anxious and does not have insomnia.        Schizophrenia.  All other systems reviewed and are negative.  Performance status (ECOG): 2  Vitals Blood pressure (!) 97/59, pulse 81, temperature 98.5 F (36.9 C), temperature source Tympanic, resp. rate 18, weight 150 lb 12.7 oz (68.4 kg), SpO2 99 %.   Physical Exam  Constitutional: She is oriented to person, place, and time. She appears well-developed and well-nourished. No distress.  Patient sitting comfortably in a chair in the exam room in no acute distress.  She rocks back and forth at times.  HENT:  Head: Normocephalic and atraumatic.  Mouth/Throat: Oropharynx is clear and moist. No oropharyngeal exudate.  Short thinning gray hair.  Mask and sequin black cap.   Eyes: Pupils are equal, round, and reactive to light.  Conjunctivae and EOM are normal. No scleral icterus.  Glasses.  Blue eyes.  Neck: Normal range of motion. Neck supple.  Cardiovascular: Normal rate, regular rhythm and normal heart sounds.  No murmur heard. Pulmonary/Chest: Effort normal and breath sounds normal. No respiratory distress. She has no wheezes. She has no rales.  Abdominal: Soft. Bowel sounds are normal. She exhibits no distension and no mass. There is abdominal tenderness in the right upper quadrant. There is no rebound, no guarding and no CVA tenderness.  Palpable peritoneal nodularity.  Musculoskeletal: Normal range of motion.        General: Edema (trace ankle) present.  Lymphadenopathy:    She has no cervical adenopathy.    She has no axillary adenopathy.       Right: No supraclavicular adenopathy present.       Left: No supraclavicular adenopathy present.  Neurological: She is alert and oriented to person, place, and time.  Skin: Skin is warm and dry. No rash noted. She is not diaphoretic. No erythema. No pallor.  Psychiatric: She has a normal mood and affect. Her behavior is normal. Judgment and thought content normal.  Schizophrenia.  Nursing note and vitals reviewed.   Infusion on 08/16/2019  Component Date Value Ref Range Status   Sodium 08/16/2019 133* 135 - 145 mmol/L Final   Potassium 08/16/2019 3.3* 3.5 - 5.1 mmol/L Final   Chloride 08/16/2019 102  98 - 111 mmol/L Final   CO2 08/16/2019 24  22 - 32 mmol/L Final   Glucose, Bld 08/16/2019 128* 70 - 99 mg/dL Final   BUN 08/16/2019 20  8 - 23 mg/dL Final   Creatinine, Ser 08/16/2019 0.75  0.44 - 1.00 mg/dL Final   Calcium 08/16/2019 8.8* 8.9 - 10.3 mg/dL Final   Total Protein 08/16/2019 5.6* 6.5 - 8.1 g/dL Final   Albumin 08/16/2019 2.7* 3.5 - 5.0 g/dL Final   AST 08/16/2019 25  15 - 41 U/L Final   ALT 08/16/2019 15  0 - 44 U/L Final   Alkaline Phosphatase 08/16/2019 89  38 - 126 U/L Final   Total Bilirubin 08/16/2019 0.2* 0.3 - 1.2 mg/dL  Final   GFR calc non Af Amer 08/16/2019 >60  >60 mL/min Final   GFR calc Af Amer 08/16/2019 >60  >60 mL/min Final   Anion gap 08/16/2019 7  5 - 15 Final   Performed at [facility],  [address], [place], [place] [address]   WBC 08/16/2019 7.3  4.0 - 10.5 K/uL Final   RBC 08/16/2019 2.68* 3.87 - 5.11 MIL/uL Final   Hemoglobin 08/16/2019 8.1* 12.0 - 15.0 g/dL Final   HCT 08/16/2019 25.4* 36.0 - 46.0 % Final   MCV 08/16/2019 94.8  80.0 - 100.0 fL Final   MCH 08/16/2019 30.2  26.0 - 34.0 pg Final   MCHC 08/16/2019 31.9  30.0 - 36.0 g/dL Final   RDW 08/16/2019 15.4  11.5 - 15.5 % Final   Platelets 08/16/2019 155  150 - 400 K/uL Final   nRBC 08/16/2019 0.0  0.0 - 0.2 % Final   Neutrophils Relative % 08/16/2019 61  % Final   Neutro Abs 08/16/2019 4.5  1.7 - 7.7 K/uL Final   Lymphocytes Relative 08/16/2019 26  % Final   Lymphs Abs 08/16/2019 1.9  0.7 - 4.0 K/uL Final   Monocytes Relative 08/16/2019 9  % Final   Monocytes Absolute 08/16/2019 0.7  0.1 - 1.0 K/uL Final   Eosinophils Relative 08/16/2019 2  % Final   Eosinophils Absolute 08/16/2019 0.1  0.0 - 0.5 K/uL Final   Basophils Relative 08/16/2019 1  % Final   Basophils Absolute 08/16/2019 0.1  0.0 - 0.1 K/uL Final   Immature Granulocytes 08/16/2019 1  % Final   Abs Immature Granulocytes 08/16/2019 0.04  0.00 - 0.07 K/uL Final   Performed at [facility], [address], [place], [place] [address]    Assessment:  Nakeysha Pasqual is a 73 y.o. female with stage IV colon cancerwith peritoneal metastasis and possible liver metastasis. She is s/p exploratory laparotomy with right hemicolectomy and terminal ileum resection and separate small bowel resection on 12/06/2017. 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 Radial margin was positive for invasive carcinoma. There were 2 colon adenomas (2.5 and 1.8 cm) of the ascending  colon. Metastatic adenocarcinoma was seen in 4 of 13 lymph nodes. There were 2 tumor deposits. There was a segment of small intestine with abscess which also showed adenocarcinoma invading the distal appendix with perforation. One mesenteric lymph node was negative for malignancy. Retroperitoneal tumor removal was positive for mucinous adenocarcinoma. Proximal and distal margins were negative. Pathologic stagewas [id number]. MSI stable. KRASmutation was positive  She received 5 cycles of FOLFOX(01/10/2018 - 03/27/2018). She began Neulasta with cycle #2. Oxaliplatin was decreased from 85 mg/m2 to 65 mg/m2 with cycle #4. She received 6 cycles of5FU + LV with Avastin(05/01/2018 - 07/17/2018).  She received 10 cycles of FOLIFIRI + Avastin(07/31/2018 - 02/13/2019). She receives chemotherapy every 3 weeks secondary to tolerance. Irinotecan was decreased from 150 mg/m2 to 125 mg/m2 with cycle #2. 5FU bolus was discontinued with cycle #6.  Chest, abdomen, and pelvic CTon 03/02/2019 revealed progressive disease within the abdomen and pelvis. There was increase in soft tissue thickening along the surgical site at the ileocolic junction (2.9 x 1.9 cm to 4.6 x 2.5 cm). There was new and progressive peritoneal metastasis (2.1 x 2.4 cm). There was no bowel obstruction or other acute complication. There was enlarging soft tissue metastasis on the right pelvic oblique musculature (1.1 cm to 1.7 cm).  She isday34ofcycle #3Lonsurf (03/14/2019- 05/21/2019).Cycle #2 was notable for pancytopenia. Shehad grade III anemia andrequired 1 unit of PRBCs on 05/04/2019,06/05/2019,and07/17/2020.She receives Retacrit(last 06/04/2019).  Chest, abdomen, pelvisCTon 06/14/2019 revealedsignificant worsening of metastatic disease. There was increasing mesenteric and serosal metastatic disease,extendingsuperiorly through the mesentery, encasing more peripheral branches  of the SMA, and extending  into the region of the pancreatic head, possibly encasing or invading the pancreatic head.Metastatic disease completely occludedthe superior mesenteric vein, just inferior to the confluence. The portal and splenic veins remainedpatent. Metastatic disease also extendedinferiorly, extending along thickened walls of small bowel loops in the left pelvis consistent with serosal metastasis. There was increased attenuation in the fat of the upper abdomen with wall thickening in the region of the gastric antrum and along much of the duodenum. The nodule in the right upper lobe hadslowly enlarged, c/wmetastatic disease. There was worsening metastatic disease in the liver with enlarging capsularimplants and at least 2 new hepatic lesions.There was worsening metastatic disease at the ileocecal suture line. The bladderwasthick walled raising the possibility of cystitis.   CEAhas been followed: 4.8 on 12/04/2017, 3.1 on 01/10/2018, 4.2 on 03/06/2018, 3.8 on 05/01/2018, 9.2 on 07/31/2018, 5.9 on 09/18/2018, 5.0 on 10/30/2018, 5.0 on 02/13/2019, 7.5 on 03/13/2019, 8.1 on 04/12/2019, 9.0 on 05/07/2019, 10.7 on 05/17/2019, 14.5 on 07/19/2019, and 23.1 on 08/16/2019.  She has macrocytic RBC indices. She has a history of alow B12 level. B12 was 266 on 01/30/2018 and 2448 on 01/22/2019. Folatewas 32 on 01/30/2018 and 45 on 01/22/2019. TSHwas 0.913 on 12/03/2017 and 1.627 on 01/22/2019.Ferritinwas 535 on 04/30/2019 and 235 on 08/16/2019.  She has paranoid schizophrenia with delusions. She lives in a group home.Codestatusis DNR/DNI.  Symptomatically, she appears to be eating well.  She has had some side pain.  Exam reveals palpable peritoneal nodularity.  Hemoglobin is 8.1.  Plan: 1.   Labs today: CBC with diff, CMP, CEA, ferritin, iron studies, retic. 2.   Stage IV colon cancer Sheis s/pFOLFOX then 5FU + LV + Avastin. She is s/pFOLFIRI + Avastin She iss/p 3 cycles ofLonsurf. CT scans  on 06/14/2019 revealed progressive disease. Patient declined regorafenib (Stivarga). Patient remains uninterested in further therapy.  Continue supportive care. 3.   Occlusion of superior mesenteric vein  Patient tolerating Eliquis over the past month.  No increased bruising or bleeding.  Continue Eliquis. 4.   Anemia of chronic disease Hematocrit 25.4.  Hemoglobin8.1. MCV94.8. Ferritin 235.  Iron saturation 14% and TIBC 211.  RN to contact [name] [phone]) regarding anemia work-up.  Continue to monitor. 5.Electrolyte issues Sodium 133.  Potassium 3.3.  Patient would like to try potassium rich foods.  Continue to monitor. 6.Code status DNR/DNI. 7.RTC in 1 month for MD assessment and labs (CBC with diff, CMP, B12, folate).  Addendum:  Sofie Rower, RN received a call from the family care facility [name]) on 08/21/2019 that the patient was bed bound and her health was declining.  The patient's family would like to pursue Hospice.  Sofie Rower, RN contacted the patient's sister, Ainsley Spinner, who confirmed the plan for Hospice.  I discussed the assessment and treatment plan with the patient.  The patient was provided an opportunity to ask questions and all were answered.  The patient agreed with the plan and demonstrated an understanding of the instructions.  The patient was advised to call back if the symptoms worsen or if the condition fails to improve as anticipated.  I provided 19 minutes of face-to-face time during this this encounter and > 50% was spent counseling as documented under my assessment and plan.    Lequita Asal, MD, PhD    08/16/2019, 3:04 PM  I, Selena Batten, am acting as scribe for [employer]. Mike Gip, MD, PhD.  I, Makyiah Lie C. Mike Gip, MD, have reviewed the  above documentation for accuracy and completeness, and I agree with the above.

## 2019-08-14 NOTE — Progress Notes (Signed)
Called patient no answer, left message

## 2019-08-16 ENCOUNTER — Other Ambulatory Visit: Payer: Self-pay

## 2019-08-16 ENCOUNTER — Inpatient Hospital Stay: Payer: Medicare Other | Attending: Hematology and Oncology | Admitting: Hematology and Oncology

## 2019-08-16 ENCOUNTER — Encounter: Payer: Self-pay | Admitting: Hematology and Oncology

## 2019-08-16 ENCOUNTER — Inpatient Hospital Stay: Payer: Medicare Other

## 2019-08-16 VITALS — BP 97/59 | HR 81 | Temp 98.5°F | Resp 18 | Wt 150.8 lb

## 2019-08-16 DIAGNOSIS — D649 Anemia, unspecified: Secondary | ICD-10-CM

## 2019-08-16 DIAGNOSIS — Z79899 Other long term (current) drug therapy: Secondary | ICD-10-CM | POA: Insufficient documentation

## 2019-08-16 DIAGNOSIS — I10 Essential (primary) hypertension: Secondary | ICD-10-CM | POA: Insufficient documentation

## 2019-08-16 DIAGNOSIS — D638 Anemia in other chronic diseases classified elsewhere: Secondary | ICD-10-CM | POA: Insufficient documentation

## 2019-08-16 DIAGNOSIS — Z87891 Personal history of nicotine dependence: Secondary | ICD-10-CM | POA: Insufficient documentation

## 2019-08-16 DIAGNOSIS — C189 Malignant neoplasm of colon, unspecified: Secondary | ICD-10-CM

## 2019-08-16 DIAGNOSIS — C787 Secondary malignant neoplasm of liver and intrahepatic bile duct: Secondary | ICD-10-CM | POA: Diagnosis not present

## 2019-08-16 DIAGNOSIS — Z7901 Long term (current) use of anticoagulants: Secondary | ICD-10-CM | POA: Diagnosis not present

## 2019-08-16 DIAGNOSIS — C180 Malignant neoplasm of cecum: Secondary | ICD-10-CM | POA: Insufficient documentation

## 2019-08-16 DIAGNOSIS — E871 Hypo-osmolality and hyponatremia: Secondary | ICD-10-CM

## 2019-08-16 DIAGNOSIS — C7989 Secondary malignant neoplasm of other specified sites: Secondary | ICD-10-CM | POA: Diagnosis not present

## 2019-08-16 DIAGNOSIS — Z7401 Bed confinement status: Secondary | ICD-10-CM | POA: Insufficient documentation

## 2019-08-16 DIAGNOSIS — E876 Hypokalemia: Secondary | ICD-10-CM

## 2019-08-16 DIAGNOSIS — Z66 Do not resuscitate: Secondary | ICD-10-CM | POA: Diagnosis not present

## 2019-08-16 DIAGNOSIS — C786 Secondary malignant neoplasm of retroperitoneum and peritoneum: Secondary | ICD-10-CM | POA: Diagnosis not present

## 2019-08-16 LAB — CBC WITH DIFFERENTIAL/PLATELET
Abs Immature Granulocytes: 0.04 10*3/uL (ref 0.00–0.07)
Basophils Absolute: 0.1 10*3/uL (ref 0.0–0.1)
Basophils Relative: 1 %
Eosinophils Absolute: 0.1 10*3/uL (ref 0.0–0.5)
Eosinophils Relative: 2 %
HCT: 25.4 % — ABNORMAL LOW (ref 36.0–46.0)
Hemoglobin: 8.1 g/dL — ABNORMAL LOW (ref 12.0–15.0)
Immature Granulocytes: 1 %
Lymphocytes Relative: 26 %
Lymphs Abs: 1.9 10*3/uL (ref 0.7–4.0)
MCH: 30.2 pg (ref 26.0–34.0)
MCHC: 31.9 g/dL (ref 30.0–36.0)
MCV: 94.8 fL (ref 80.0–100.0)
Monocytes Absolute: 0.7 10*3/uL (ref 0.1–1.0)
Monocytes Relative: 9 %
Neutro Abs: 4.5 10*3/uL (ref 1.7–7.7)
Neutrophils Relative %: 61 %
Platelets: 155 10*3/uL (ref 150–400)
RBC: 2.68 MIL/uL — ABNORMAL LOW (ref 3.87–5.11)
RDW: 15.4 % (ref 11.5–15.5)
WBC: 7.3 10*3/uL (ref 4.0–10.5)
nRBC: 0 % (ref 0.0–0.2)

## 2019-08-16 LAB — COMPREHENSIVE METABOLIC PANEL
ALT: 15 U/L (ref 0–44)
AST: 25 U/L (ref 15–41)
Albumin: 2.7 g/dL — ABNORMAL LOW (ref 3.5–5.0)
Alkaline Phosphatase: 89 U/L (ref 38–126)
Anion gap: 7 (ref 5–15)
BUN: 20 mg/dL (ref 8–23)
CO2: 24 mmol/L (ref 22–32)
Calcium: 8.8 mg/dL — ABNORMAL LOW (ref 8.9–10.3)
Chloride: 102 mmol/L (ref 98–111)
Creatinine, Ser: 0.75 mg/dL (ref 0.44–1.00)
GFR calc Af Amer: >60 mL/min (ref >60)
GFR calc non Af Amer: >60 mL/min (ref >60)
Glucose, Bld: 128 mg/dL — ABNORMAL HIGH (ref 70–99)
Potassium: 3.3 mmol/L — ABNORMAL LOW (ref 3.5–5.1)
Sodium: 133 mmol/L — ABNORMAL LOW (ref 135–145)
Total Bilirubin: 0.2 mg/dL — ABNORMAL LOW (ref 0.3–1.2)
Total Protein: 5.6 g/dL — ABNORMAL LOW (ref 6.5–8.1)

## 2019-08-16 LAB — SEDIMENTATION RATE: Sed Rate: 51 mm/hr — ABNORMAL HIGH (ref 0–30)

## 2019-08-16 LAB — FERRITIN: Ferritin: 235 ng/mL (ref 11–307)

## 2019-08-16 LAB — RETICULOCYTES
Immature Retic Fract: 28.5 % — ABNORMAL HIGH (ref 2.3–15.9)
RBC.: 2.74 MIL/uL — ABNORMAL LOW (ref 3.87–5.11)
Retic Count, Absolute: 98.1 10*3/uL (ref 19.0–186.0)
Retic Ct Pct: 3.6 % — ABNORMAL HIGH (ref 0.4–3.1)

## 2019-08-16 LAB — IRON AND TIBC
Iron: 29 ug/dL (ref 28–170)
Saturation Ratios: 14 % (ref 10.4–31.8)
TIBC: 211 ug/dL — ABNORMAL LOW (ref 250–450)
UIBC: 182 ug/dL

## 2019-08-16 MED ORDER — SODIUM CHLORIDE 0.9% FLUSH
10.0000 mL | INTRAVENOUS | Status: DC | PRN
  Administered 2019-08-16: 10 mL via INTRAVENOUS
  Filled 2019-08-16: qty 10

## 2019-08-16 MED ORDER — HEPARIN SOD (PORK) LOCK FLUSH 100 UNIT/ML IV SOLN
500.0000 [IU] | Freq: Once | INTRAVENOUS | Status: AC
  Administered 2019-08-16: 500 [IU] via INTRAVENOUS

## 2019-08-16 MED ORDER — APIXABAN 2.5 MG PO TABS: 2.5000 mg | ORAL_TABLET | Freq: Two times a day (BID) | ORAL | 4 refills | Status: AC

## 2019-08-16 NOTE — Progress Notes (Signed)
Pt in for 1 month follow up.  

## 2019-08-16 NOTE — Progress Notes (Signed)
All the patient medication has been verified by Mr Ahmed Prima ( care giver). Eliquis  was picked up by

## 2019-08-17 LAB — CEA: CEA: 23.1 ng/mL — ABNORMAL HIGH (ref 0.0–4.7)

## 2019-08-21 ENCOUNTER — Telehealth: Payer: Self-pay | Admitting: *Deleted

## 2019-08-21 NOTE — Telephone Encounter (Signed)
Ms Latoya Jones called regarding Latoya Jones. She states we really need Hospice to come out ASAP. The decision had been made for Comfort care only. She reports patient seems to be declining quickly. They want to try and keep her there as long as possible. I will inform MD and make a Hospice referral.

## 2019-08-21 NOTE — Telephone Encounter (Signed)
I called Latoya Jones back to ask if she has spoken to patients family. She states she had spoken with her sister [name]. I attempted to call the sister to be sure and did not have the correct phone number. I called [name] back and requested correctt phone number. Latoya Jones said she would call me back with a number

## 2019-08-21 NOTE — Telephone Encounter (Signed)
[name] called back with correct phone number [phone];          I spoke with Manuela Schwartz,( pts sister) who states she knows patient is declining. Wants comfort care only preferable in the [place] facility. She feels [place] needs more care from a Hospice Nurse.; too much for Family care home staff. I told her Hospice would come and evaluate her needs. They would make that determination if and when she can go to the Hospice Home.

## 2019-08-23 ENCOUNTER — Telehealth: Payer: Self-pay

## 2019-08-23 ENCOUNTER — Other Ambulatory Visit: Payer: Self-pay | Admitting: Hematology and Oncology

## 2019-08-23 MED ORDER — HYDROCODONE-ACETAMINOPHEN 5-325 MG PO TABS: 1.0000 | ORAL_TABLET | ORAL | 0 refills | Status: DC | PRN

## 2019-08-23 MED ORDER — LORAZEPAM 0.5 MG PO TABS: 0.5000 mg | ORAL_TABLET | Freq: Four times a day (QID) | ORAL | 0 refills | Status: DC | PRN

## 2019-08-24 ENCOUNTER — Other Ambulatory Visit: Payer: Self-pay | Admitting: *Deleted

## 2019-08-24 ENCOUNTER — Other Ambulatory Visit: Payer: Self-pay

## 2019-08-24 MED ORDER — HYDROCODONE-ACETAMINOPHEN 5-325 MG PO TABS: 1.0000 | ORAL_TABLET | ORAL | 0 refills | Status: DC | PRN

## 2019-08-24 MED ORDER — HYDROCODONE-ACETAMINOPHEN 5-325 MG PO TABS: 1.0000 | ORAL_TABLET | ORAL | 0 refills | Status: AC | PRN

## 2019-08-24 MED ORDER — LORAZEPAM 0.5 MG PO TABS: 0.5000 mg | ORAL_TABLET | Freq: Four times a day (QID) | ORAL | 0 refills | Status: AC | PRN

## 2019-09-13 ENCOUNTER — Other Ambulatory Visit: Payer: Medicare Other

## 2019-09-13 ENCOUNTER — Ambulatory Visit: Payer: Medicare Other | Admitting: Hematology and Oncology

## 2019-10-23 DEATH — deceased

## 2020-05-13 IMAGING — CT CT ABDOMEN AND PELVIS WITH CONTRAST
1 of 3 series · 9 of 32 positions shown, 15 images · IV contrast (omnipaque)
Comparison: March 02, 2019

CLINICAL DATA: History of colon cancer.

EXAM:
CT CHEST, ABDOMEN, AND PELVIS WITH CONTRAST
TECHNIQUE: Multidetector CT imaging of the chest, abdomen and pelvis was
performed following the standard protocol during bolus
administration of intravenous contrast.
CONTRAST:  100mL OMNIPAQUE IOHEXOL 300 MG/ML  SOLN

[Series 2: cap with · axial · 0.78mm/px · z∈[-886,-376]mm · 9 of 126 slices shown, 15 images]
[im 12/126  soft-tissue]
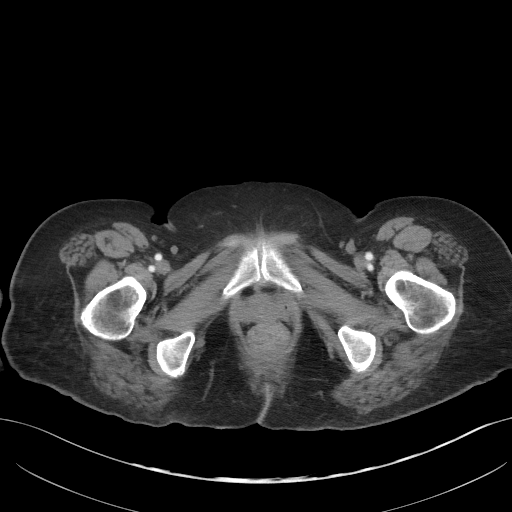
[im 12/126  bone]
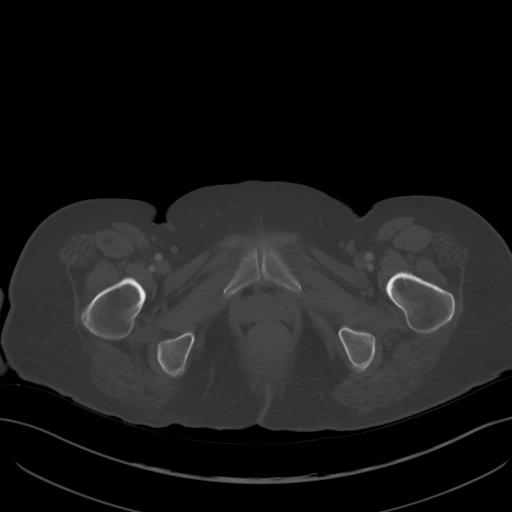
[im 23/126  soft-tissue]
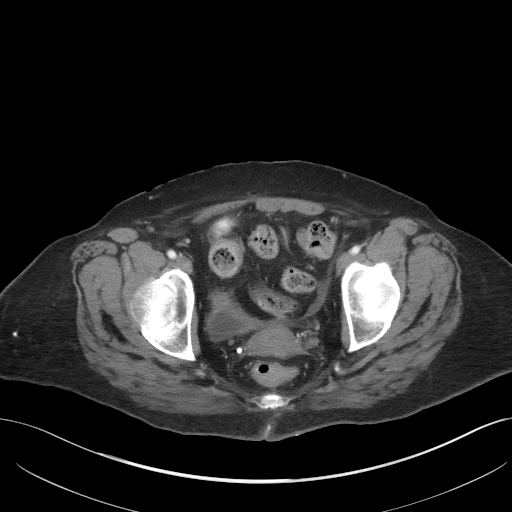
[im 35/126  soft-tissue]
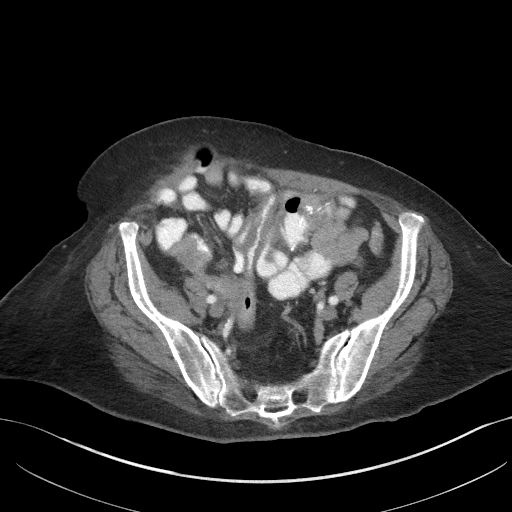
[im 46/126  soft-tissue]
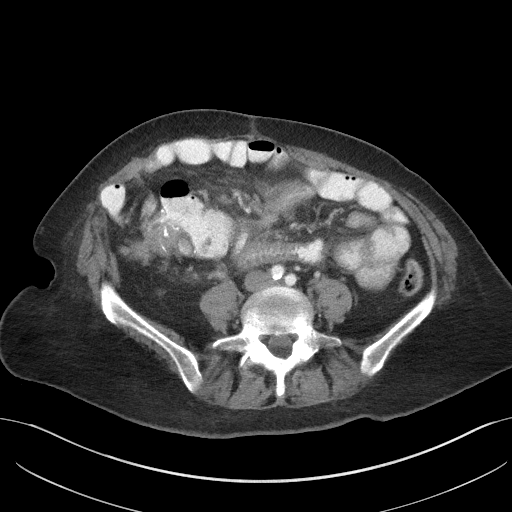
[im 69/126  soft-tissue]
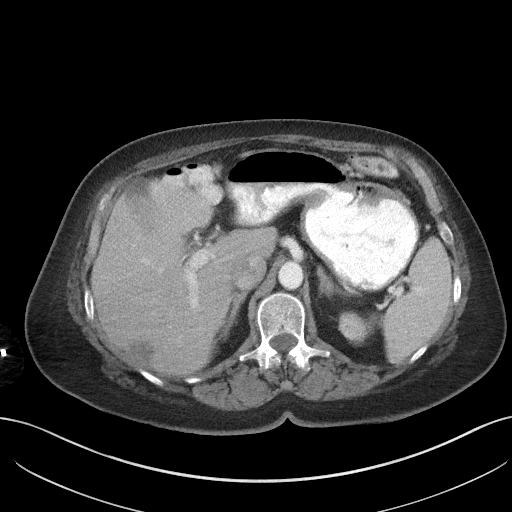
[im 80/126  soft-tissue]
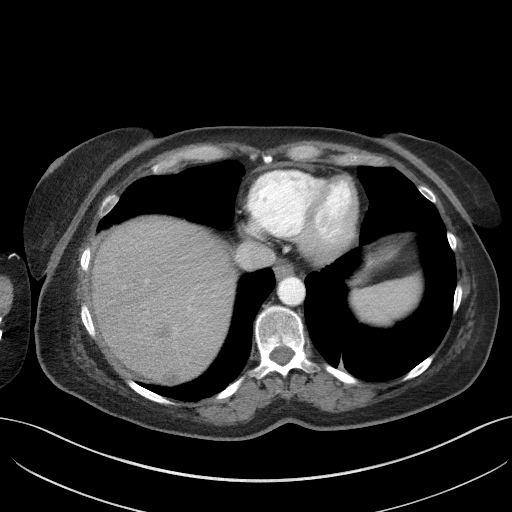
[im 80/126  lung]
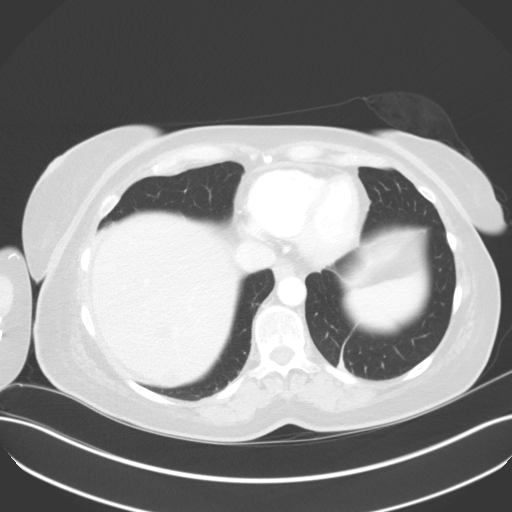
[im 91/126  soft-tissue]
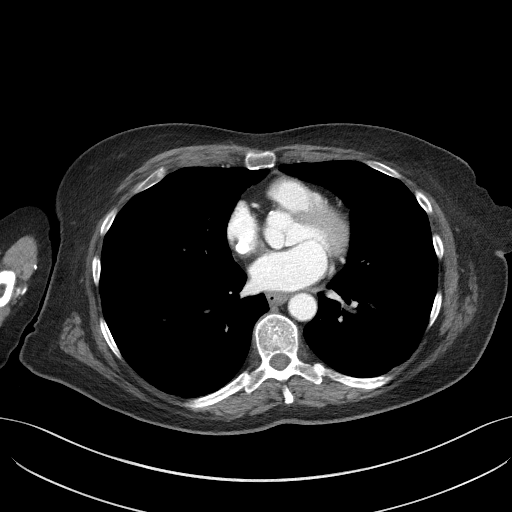
[im 91/126  lung]
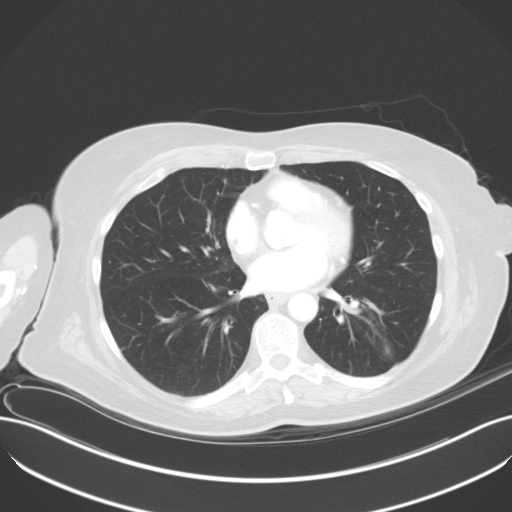
[im 103/126  soft-tissue]
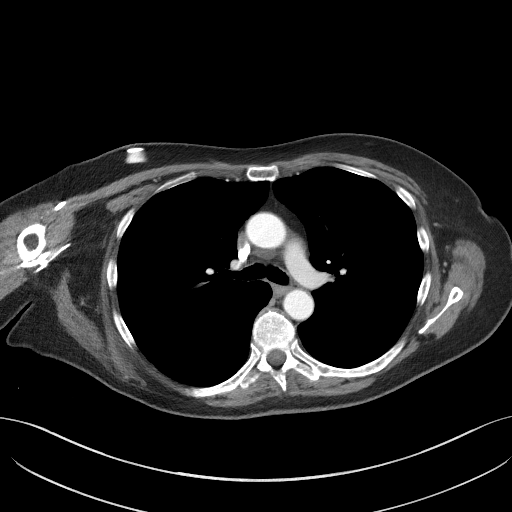
[im 103/126  lung]
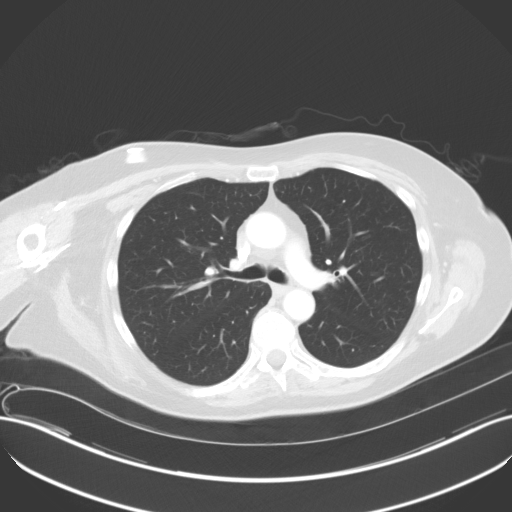
[im 114/126  soft-tissue]
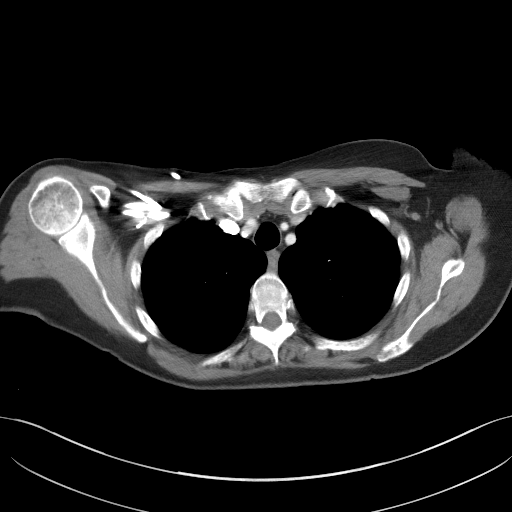
[im 114/126  lung]
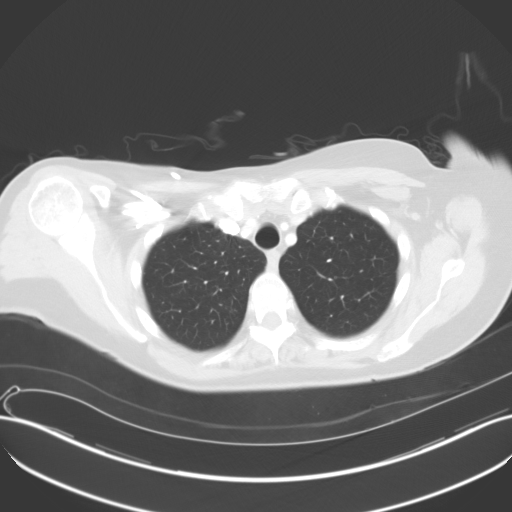
[im 114/126  bone]
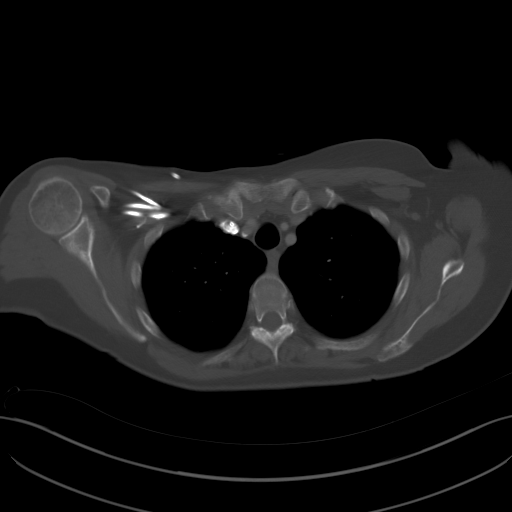

[9 of 32 positions shown; findings below may reference images not displayed]

FINDINGS: CT CHEST FINDINGS

Cardiovascular: The heart is unchanged. Coronary artery
calcifications are stable. No aneurysm or dissection seen in the
thoracic aorta. No significant atherosclerotic changes. The main
pulmonary arteries are unremarkable.

Mediastinum/Nodes: No effusions. The distal esophagus is unchanged
unremarkable. The thyroid is normal. No adenopathy is identified in
the chest. The Port-A-Cath terminates in the central SVC.

Lungs/Pleura: Central airways are normal. A nodule in the periphery
of the right upper lobe on series 5, image 38 measures 7 by 5 mm
today versus 7 x 4 mm previously. This nodule is larger when
compared to more remote studies. No other nodules. No masses or
infiltrates. Scattered atelectasis and/or scarring in the lingula
and left lower lobe.

Musculoskeletal: See below

CT ABDOMEN PELVIS FINDINGS

Hepatobiliary: There is an apparent new metastatic lesion in the
hepatic dome on series 2, image 47 measuring 13 mm. The capsular
based right hepatic lobe lesion on series 2, image 60 measures
by 2.0 cm today versus 2.8 x 1.3 cm previously. The more superior
capsular based right hepatic lobe lesion on series 2, image 47
measures 1.5 cm today, unchanged. Another new metastatic lesion is
seen in the inferior right hepatic lobe on series 2, image 64. The
gallbladder is unremarkable. No blood flow is seen in the branches
of the left portal vein but this is a stable finding and chronic.
Other portal vein branches remain patent centrally.

Pancreas: There is increased soft tissue fullness in the region of
the pancreatic head as seen on series 2, image 71 measuring 3.7 by
4.5 cm today. This soft tissue extends inferiorly to the mesenteric
implant described below on series 2, image 85. There is increased
attenuation in the adjacent fat. There is occlusion of the superior
mesenteric pain just below the confluence of the portal and splenic
veins. Soft tissue extends along a branch of the SMA as seen on
series 2, image 75. These findings are new. The remainder of the
pancreas is normal.

Spleen: Normal in size without focal abnormality.

Adrenals/Urinary Tract: There is a nodule in the left adrenal gland
which remains stable and unchanged since Wednesday November, 2017. The right
adrenal gland is normal. Renal cysts are again identified. There is
a nonobstructive stone in the left kidney which is stable. No
perinephric stranding. No hydronephrosis. The ureters are normal.
The bladder is thick walled but poorly distended.

Stomach/Bowel: There is apparent wall thickening in the region of
the gastric antrum. There is also thickening involving much of the
duodenum. There is thickening associated with small bowel loops in
the pelvis as seen on axial image 91. Small bowel extrudes through
the region of anterior abdominal wall laxity, unchanged.
Postsurgical changes are seen in the colon. Again noted is abnormal
soft tissue at the suture site at the ileocecal junction. By my
measurement, this abnormal soft tissue measures 4.9 x 3.2 cm on
axial images 79 and 82. This compares to 4.6 by 2.5 cm on the
previous study, larger in the interval.

Vascular/Lymphatic: There is soft tissue around the more peripheral
branches of the SMA as described above. Branching vessels off the
aorta are otherwise normal. The aorta demonstrates atherosclerosis
with no aneurysm or dissection. Lymph nodes in the ileocolic
mesentery on series 2, image 85 are more prominent in the interval.
Again noted is abnormal soft tissue in the small bowel mesentery
seen on series 2, image 85 measuring 3.8 versus 2.9 cm today versus
2.4 x 2.1 cm previously.

Reproductive: Uterus and bilateral adnexa are unremarkable.

Other: A serosal implant along a right pelvic small bowel loops is
again identified as seen on series 2, image 93 measuring 2.6 by
cm today versus 2.2 x 2.1 cm previously. This serosal disease
extends more superiorly and inferiorly along the serosal surfaces
today consistent with worsening. A soft tissue implant in the right
oblique musculature on series 2, image 80 measures 2 cm today versus
1.7 cm previously. A small metastasis in the right lateral abdominal
wall on series 2, image 75 at the junction of the musculature and
subcutaneous fat is larger. There is free fluid in the pelvis, mild.

Musculoskeletal: No acute or significant osseous findings.
IMPRESSION: 1. Significant worsening of the patient's known metastatic disease.
The most significant finding today is occlusion of the superior
mesenteric vein due to worsening metastatic disease described below.
2. Increasing mesenteric and serosal metastatic disease. The
previously identified metastatic disease on series 2, image 85, is
larger in the interval. This metastatic disease extends superiorly
through the mesentery, encasing more peripheral branches of the SMA,
and extending into the region of the pancreatic head, possibly
encasing or invading the pancreatic head. This worsening metastatic
disease completely occludes the superior mesenteric vein, just
inferior to the confluence. The portal and splenic veins remain
patent. The metastatic disease also extends inferiorly, extending
along thickened walls of small bowel loops in the left pelvis
consistent with serosal metastasis.
3. Increased attenuation in the fat of the upper abdomen with wall
thickening in the region of the gastric antrum and along much of the
duodenum is identified. While the finding is not specific, this
could be due to vascular congestion from the superior mesenteric
vein occlusion. A secondary inflammatory or infectious process is
considered less likely.
4. The nodule in the right upper lobe has slowly enlarged,
consistent with metastatic disease.
5. Worsening metastatic disease in the liver with enlarging capsular
implants and at least 2 new hepatic lesions.
6. Worsening metastatic disease at the ileocecal suture line. Other
sites of metastases described above have worsened as well.
7. The bladder is thick walled raising the possibility of cystitis.
Recommend correlation with urinalysis.

These results will be called to the ordering clinician or
representative by the Radiologist Assistant, and communication
documented in the PACS or zVision Dashboard.
